# Patient Record
Sex: Male | Born: 1975 | Race: Black or African American | Hispanic: No | Marital: Single | Smoking: Current every day smoker
Health system: Southern US, Community
[De-identification: ages and names within clinical notes are randomized; demographics above are authoritative.]

## PROBLEM LIST (undated history)

## (undated) DIAGNOSIS — R4689 Other symptoms and signs involving appearance and behavior: Secondary | ICD-10-CM

## (undated) DIAGNOSIS — F1010 Alcohol abuse, uncomplicated: Secondary | ICD-10-CM

## (undated) DIAGNOSIS — I10 Essential (primary) hypertension: Secondary | ICD-10-CM

## (undated) DIAGNOSIS — F209 Schizophrenia, unspecified: Secondary | ICD-10-CM

## (undated) DIAGNOSIS — Z5900 Homelessness unspecified: Secondary | ICD-10-CM

## (undated) HISTORY — PX: MOUTH SURGERY: SHX715

## (undated) HISTORY — PX: KNEE SURGERY: SHX244

---

## 2006-03-14 ENCOUNTER — Emergency Department (HOSPITAL_COMMUNITY): Admission: EM | Admit: 2006-03-14 | Discharge: 2006-03-14 | Payer: Self-pay | Admitting: Emergency Medicine

## 2007-12-25 ENCOUNTER — Emergency Department (HOSPITAL_COMMUNITY): Admission: EM | Admit: 2007-12-25 | Discharge: 2007-12-26 | Payer: Self-pay | Admitting: Internal Medicine

## 2007-12-26 ENCOUNTER — Emergency Department (HOSPITAL_COMMUNITY): Admission: EM | Admit: 2007-12-26 | Discharge: 2007-12-26 | Payer: Self-pay | Admitting: Emergency Medicine

## 2007-12-29 ENCOUNTER — Emergency Department (HOSPITAL_COMMUNITY): Admission: EM | Admit: 2007-12-29 | Discharge: 2007-12-30 | Payer: Self-pay | Admitting: Podiatry

## 2008-04-08 ENCOUNTER — Encounter: Admission: RE | Admit: 2008-04-08 | Discharge: 2008-04-08 | Payer: Self-pay | Admitting: Family Medicine

## 2008-07-22 ENCOUNTER — Emergency Department (HOSPITAL_COMMUNITY): Admission: EM | Admit: 2008-07-22 | Discharge: 2008-07-22 | Payer: Self-pay | Admitting: Emergency Medicine

## 2008-08-25 ENCOUNTER — Emergency Department (HOSPITAL_COMMUNITY): Admission: EM | Admit: 2008-08-25 | Discharge: 2008-08-25 | Payer: Self-pay | Admitting: Emergency Medicine

## 2008-08-28 ENCOUNTER — Emergency Department (HOSPITAL_COMMUNITY): Admission: EM | Admit: 2008-08-28 | Discharge: 2008-08-28 | Payer: Self-pay | Admitting: Emergency Medicine

## 2008-12-30 ENCOUNTER — Other Ambulatory Visit: Payer: Self-pay

## 2008-12-31 ENCOUNTER — Ambulatory Visit: Payer: Self-pay | Admitting: Psychiatry

## 2008-12-31 ENCOUNTER — Inpatient Hospital Stay (HOSPITAL_COMMUNITY): Admission: AD | Admit: 2008-12-31 | Discharge: 2009-01-05 | Payer: Self-pay | Admitting: Psychiatry

## 2009-01-10 ENCOUNTER — Emergency Department (HOSPITAL_COMMUNITY): Admission: EM | Admit: 2009-01-10 | Discharge: 2009-01-10 | Payer: Self-pay | Admitting: Emergency Medicine

## 2009-02-01 ENCOUNTER — Emergency Department (HOSPITAL_COMMUNITY): Admission: EM | Admit: 2009-02-01 | Discharge: 2009-02-01 | Payer: Self-pay | Admitting: Family Medicine

## 2009-02-04 ENCOUNTER — Emergency Department (HOSPITAL_COMMUNITY): Admission: EM | Admit: 2009-02-04 | Discharge: 2009-02-06 | Payer: Self-pay | Admitting: Emergency Medicine

## 2009-02-19 ENCOUNTER — Emergency Department (HOSPITAL_COMMUNITY): Admission: EM | Admit: 2009-02-19 | Discharge: 2009-02-19 | Payer: Self-pay | Admitting: Family Medicine

## 2009-03-10 ENCOUNTER — Other Ambulatory Visit: Payer: Self-pay

## 2009-03-10 ENCOUNTER — Other Ambulatory Visit: Payer: Self-pay | Admitting: Emergency Medicine

## 2009-03-11 ENCOUNTER — Other Ambulatory Visit: Payer: Self-pay | Admitting: Emergency Medicine

## 2009-03-12 ENCOUNTER — Inpatient Hospital Stay (HOSPITAL_COMMUNITY): Admission: AD | Admit: 2009-03-12 | Discharge: 2009-03-18 | Payer: Self-pay | Admitting: Psychiatry

## 2009-03-12 ENCOUNTER — Ambulatory Visit: Payer: Self-pay | Admitting: Psychiatry

## 2009-04-03 ENCOUNTER — Emergency Department (HOSPITAL_COMMUNITY): Admission: EM | Admit: 2009-04-03 | Discharge: 2009-04-03 | Payer: Self-pay | Admitting: Otolaryngology

## 2009-04-04 ENCOUNTER — Emergency Department (HOSPITAL_COMMUNITY): Admission: EM | Admit: 2009-04-04 | Discharge: 2009-04-07 | Payer: Self-pay | Admitting: Emergency Medicine

## 2009-05-29 ENCOUNTER — Emergency Department (HOSPITAL_COMMUNITY): Admission: EM | Admit: 2009-05-29 | Discharge: 2009-05-31 | Payer: Self-pay | Admitting: Emergency Medicine

## 2009-05-30 ENCOUNTER — Ambulatory Visit: Payer: Self-pay | Admitting: Psychiatry

## 2009-07-06 ENCOUNTER — Emergency Department (HOSPITAL_COMMUNITY): Admission: EM | Admit: 2009-07-06 | Discharge: 2009-07-07 | Payer: Self-pay | Admitting: Emergency Medicine

## 2009-07-21 ENCOUNTER — Inpatient Hospital Stay (HOSPITAL_COMMUNITY): Admission: EM | Admit: 2009-07-21 | Discharge: 2009-07-24 | Payer: Self-pay | Admitting: Emergency Medicine

## 2009-07-24 ENCOUNTER — Ambulatory Visit: Payer: Self-pay | Admitting: Psychiatry

## 2009-07-24 ENCOUNTER — Inpatient Hospital Stay (HOSPITAL_COMMUNITY): Admission: RE | Admit: 2009-07-24 | Discharge: 2009-07-24 | Payer: Self-pay | Admitting: Psychiatry

## 2009-07-25 ENCOUNTER — Inpatient Hospital Stay (HOSPITAL_COMMUNITY): Admission: EM | Admit: 2009-07-25 | Discharge: 2009-07-28 | Payer: Self-pay | Admitting: Emergency Medicine

## 2009-07-25 ENCOUNTER — Ambulatory Visit: Payer: Self-pay | Admitting: Vascular Surgery

## 2009-07-25 ENCOUNTER — Encounter (INDEPENDENT_AMBULATORY_CARE_PROVIDER_SITE_OTHER): Payer: Self-pay | Admitting: Emergency Medicine

## 2009-07-26 ENCOUNTER — Ambulatory Visit: Payer: Self-pay | Admitting: Psychiatry

## 2009-07-28 ENCOUNTER — Inpatient Hospital Stay (HOSPITAL_COMMUNITY): Admission: EM | Admit: 2009-07-28 | Discharge: 2009-08-02 | Payer: Self-pay | Admitting: Psychiatry

## 2009-07-30 ENCOUNTER — Other Ambulatory Visit: Payer: Self-pay | Admitting: Emergency Medicine

## 2009-09-07 ENCOUNTER — Emergency Department (HOSPITAL_COMMUNITY): Admission: EM | Admit: 2009-09-07 | Discharge: 2009-09-07 | Payer: Self-pay | Admitting: Emergency Medicine

## 2009-10-03 ENCOUNTER — Emergency Department (HOSPITAL_COMMUNITY): Admission: EM | Admit: 2009-10-03 | Discharge: 2009-10-06 | Payer: Self-pay | Admitting: Emergency Medicine

## 2009-10-09 ENCOUNTER — Emergency Department (HOSPITAL_COMMUNITY): Admission: EM | Admit: 2009-10-09 | Discharge: 2009-10-10 | Payer: Self-pay | Admitting: Emergency Medicine

## 2009-12-03 ENCOUNTER — Emergency Department (HOSPITAL_COMMUNITY): Admission: EM | Admit: 2009-12-03 | Discharge: 2009-12-05 | Payer: Self-pay | Admitting: Emergency Medicine

## 2010-04-22 ENCOUNTER — Emergency Department (HOSPITAL_COMMUNITY)
Admission: EM | Admit: 2010-04-22 | Discharge: 2010-04-22 | Disposition: A | Discharge status: Home / Self Care | Payer: Medicaid Other | Attending: Emergency Medicine | Admitting: Emergency Medicine

## 2010-04-22 ENCOUNTER — Emergency Department (HOSPITAL_COMMUNITY): Payer: Medicaid Other

## 2010-04-22 DIAGNOSIS — W230XXA Caught, crushed, jammed, or pinched between moving objects, initial encounter: Secondary | ICD-10-CM | POA: Insufficient documentation

## 2010-04-22 DIAGNOSIS — G40909 Epilepsy, unspecified, not intractable, without status epilepticus: Secondary | ICD-10-CM | POA: Insufficient documentation

## 2010-04-22 DIAGNOSIS — F319 Bipolar disorder, unspecified: Secondary | ICD-10-CM | POA: Insufficient documentation

## 2010-04-22 DIAGNOSIS — S6000XA Contusion of unspecified finger without damage to nail, initial encounter: Secondary | ICD-10-CM | POA: Insufficient documentation

## 2010-04-22 DIAGNOSIS — J45909 Unspecified asthma, uncomplicated: Secondary | ICD-10-CM | POA: Insufficient documentation

## 2010-04-22 DIAGNOSIS — F209 Schizophrenia, unspecified: Secondary | ICD-10-CM | POA: Insufficient documentation

## 2010-05-05 LAB — CBC
HCT: 44.6 % (ref 39.0–52.0)
Hemoglobin: 15.4 g/dL (ref 13.0–17.0)
MCH: 33 pg (ref 26.0–34.0)
MCHC: 34.6 g/dL (ref 30.0–36.0)
MCV: 95.6 fL (ref 78.0–100.0)
Platelets: 135 10*3/uL — ABNORMAL LOW (ref 150–400)
RBC: 4.66 MIL/uL (ref 4.22–5.81)
RDW: 12.2 % (ref 11.5–15.5)
WBC: 7.2 10*3/uL (ref 4.0–10.5)

## 2010-05-05 LAB — COMPREHENSIVE METABOLIC PANEL
ALT: 19 U/L (ref 0–53)
AST: 25 U/L (ref 0–37)
Albumin: 3.9 g/dL (ref 3.5–5.2)
Alkaline Phosphatase: 60 U/L (ref 39–117)
BUN: 9 mg/dL (ref 6–23)
CO2: 23 mEq/L (ref 19–32)
Calcium: 9.5 mg/dL (ref 8.4–10.5)
Chloride: 106 mEq/L (ref 96–112)
Creatinine, Ser: 1.05 mg/dL (ref 0.4–1.5)
GFR calc Af Amer: >60 mL/min (ref >60)
GFR calc non Af Amer: >60 mL/min (ref >60)
Glucose, Bld: 110 mg/dL — ABNORMAL HIGH (ref 70–99)
Potassium: 3.5 mEq/L (ref 3.5–5.1)
Sodium: 141 mEq/L (ref 135–145)
Total Bilirubin: 0.9 mg/dL (ref 0.3–1.2)
Total Protein: 7.1 g/dL (ref 6.0–8.3)

## 2010-05-05 LAB — URINALYSIS, ROUTINE W REFLEX MICROSCOPIC
Bilirubin Urine: NEGATIVE
Glucose, UA: NEGATIVE mg/dL
Hgb urine dipstick: NEGATIVE
Ketones, ur: NEGATIVE mg/dL
Nitrite: NEGATIVE
Protein, ur: NEGATIVE mg/dL
Specific Gravity, Urine: 1.023 (ref 1.005–1.030)
Urobilinogen, UA: 1 mg/dL (ref 0.0–1.0)
pH: 5 (ref 5.0–8.0)

## 2010-05-05 LAB — RAPID URINE DRUG SCREEN, HOSP PERFORMED
Amphetamines: NOT DETECTED
Barbiturates: NOT DETECTED
Benzodiazepines: NOT DETECTED
Cocaine: NOT DETECTED
Opiates: NOT DETECTED
Tetrahydrocannabinol: POSITIVE — AB

## 2010-05-05 LAB — DIFFERENTIAL
Basophils Absolute: 0 10*3/uL (ref 0.0–0.1)
Basophils Relative: 0 % (ref 0–1)
Eosinophils Absolute: 0 10*3/uL (ref 0.0–0.7)
Eosinophils Relative: 0 % (ref 0–5)
Lymphocytes Relative: 17 % (ref 12–46)
Lymphs Abs: 1.2 10*3/uL (ref 0.7–4.0)
Monocytes Absolute: 0.6 10*3/uL (ref 0.1–1.0)
Monocytes Relative: 9 % (ref 3–12)
Neutro Abs: 5.3 10*3/uL (ref 1.7–7.7)
Neutrophils Relative %: 74 % (ref 43–77)

## 2010-05-05 LAB — ETHANOL: Alcohol, Ethyl (B): 81 mg/dL — ABNORMAL HIGH (ref 0–10)

## 2010-05-05 LAB — VALPROIC ACID LEVEL: Valproic Acid Lvl: <10 ug/mL — ABNORMAL LOW (ref 50.0–100.0)

## 2010-05-05 LAB — LITHIUM LEVEL: Lithium Lvl: <0.25 mEq/L — ABNORMAL LOW (ref 0.80–1.40)

## 2010-05-06 LAB — RAPID URINE DRUG SCREEN, HOSP PERFORMED
Amphetamines: NOT DETECTED
Amphetamines: NOT DETECTED
Barbiturates: NOT DETECTED
Barbiturates: NOT DETECTED
Benzodiazepines: NOT DETECTED
Benzodiazepines: NOT DETECTED
Cocaine: NOT DETECTED
Cocaine: NOT DETECTED
Opiates: NOT DETECTED
Opiates: NOT DETECTED
Tetrahydrocannabinol: POSITIVE — AB
Tetrahydrocannabinol: POSITIVE — AB

## 2010-05-06 LAB — TRICYCLICS SCREEN, URINE: TCA Scrn: NOT DETECTED

## 2010-05-06 LAB — BASIC METABOLIC PANEL
BUN: 14 mg/dL (ref 6–23)
BUN: 16 mg/dL (ref 6–23)
CO2: 25 mEq/L (ref 19–32)
CO2: 30 mEq/L (ref 19–32)
Calcium: 9.5 mg/dL (ref 8.4–10.5)
Calcium: 9.5 mg/dL (ref 8.4–10.5)
Chloride: 108 mEq/L (ref 96–112)
Chloride: 107 mEq/L (ref 96–112)
Creatinine, Ser: 1.03 mg/dL (ref 0.4–1.5)
Creatinine, Ser: 1.4 mg/dL (ref 0.4–1.5)
GFR calc Af Amer: >60 mL/min (ref >60)
GFR calc Af Amer: >60 mL/min (ref >60)
GFR calc non Af Amer: >60 mL/min (ref >60)
GFR calc non Af Amer: 58 mL/min — ABNORMAL LOW (ref >60)
Glucose, Bld: 94 mg/dL (ref 70–99)
Glucose, Bld: 82 mg/dL (ref 70–99)
Potassium: 3.9 mEq/L (ref 3.5–5.1)
Potassium: 4.4 mEq/L (ref 3.5–5.1)
Sodium: 140 mEq/L (ref 135–145)
Sodium: 142 mEq/L (ref 135–145)

## 2010-05-06 LAB — DIFFERENTIAL
Basophils Absolute: 0 10*3/uL (ref 0.0–0.1)
Basophils Absolute: 0 10*3/uL (ref 0.0–0.1)
Basophils Relative: 0 % (ref 0–1)
Basophils Relative: 0 % (ref 0–1)
Eosinophils Absolute: 0 10*3/uL (ref 0.0–0.7)
Eosinophils Absolute: 0 10*3/uL (ref 0.0–0.7)
Eosinophils Relative: 0 % (ref 0–5)
Eosinophils Relative: 0 % (ref 0–5)
Lymphocytes Relative: 30 % (ref 12–46)
Lymphocytes Relative: 31 % (ref 12–46)
Lymphs Abs: 1.8 10*3/uL (ref 0.7–4.0)
Lymphs Abs: 2.6 10*3/uL (ref 0.7–4.0)
Monocytes Absolute: 0.5 10*3/uL (ref 0.1–1.0)
Monocytes Absolute: 0.7 10*3/uL (ref 0.1–1.0)
Monocytes Relative: 9 % (ref 3–12)
Monocytes Relative: 9 % (ref 3–12)
Neutro Abs: 3.6 10*3/uL (ref 1.7–7.7)
Neutro Abs: 5 10*3/uL (ref 1.7–7.7)
Neutrophils Relative %: 61 % (ref 43–77)
Neutrophils Relative %: 59 % (ref 43–77)

## 2010-05-06 LAB — VALPROIC ACID LEVEL
Valproic Acid Lvl: 107.8 ug/mL — ABNORMAL HIGH (ref 50.0–100.0)
Valproic Acid Lvl: 63.1 ug/mL (ref 50.0–100.0)

## 2010-05-06 LAB — ETHANOL
Alcohol, Ethyl (B): <5 mg/dL (ref 0–10)
Alcohol, Ethyl (B): <5 mg/dL (ref 0–10)

## 2010-05-06 LAB — CBC
HCT: 47.6 % (ref 39.0–52.0)
HCT: 46.5 % (ref 39.0–52.0)
Hemoglobin: 16.1 g/dL (ref 13.0–17.0)
Hemoglobin: 15.6 g/dL (ref 13.0–17.0)
MCH: 33.4 pg (ref 26.0–34.0)
MCH: 33.3 pg (ref 26.0–34.0)
MCHC: 33.9 g/dL (ref 30.0–36.0)
MCHC: 33.5 g/dL (ref 30.0–36.0)
MCV: 98.4 fL (ref 78.0–100.0)
MCV: 99.5 fL (ref 78.0–100.0)
Platelets: 107 10*3/uL — ABNORMAL LOW (ref 150–400)
Platelets: 112 10*3/uL — ABNORMAL LOW (ref 150–400)
RBC: 4.83 MIL/uL (ref 4.22–5.81)
RBC: 4.68 MIL/uL (ref 4.22–5.81)
RDW: 13.3 % (ref 11.5–15.5)
RDW: 13.4 % (ref 11.5–15.5)
WBC: 6 10*3/uL (ref 4.0–10.5)
WBC: 8.4 10*3/uL (ref 4.0–10.5)

## 2010-05-06 LAB — LITHIUM LEVEL
Lithium Lvl: <0.25 mEq/L — ABNORMAL LOW (ref 0.80–1.40)
Lithium Lvl: 1.53 mEq/L — ABNORMAL HIGH (ref 0.80–1.40)

## 2010-05-07 LAB — BASIC METABOLIC PANEL
BUN: 9 mg/dL (ref 6–23)
CO2: 30 mEq/L (ref 19–32)
Calcium: 9.5 mg/dL (ref 8.4–10.5)
Chloride: 107 mEq/L (ref 96–112)
Creatinine, Ser: 1.11 mg/dL (ref 0.4–1.5)
GFR calc Af Amer: >60 mL/min (ref >60)
GFR calc non Af Amer: >60 mL/min (ref >60)
Glucose, Bld: 88 mg/dL (ref 70–99)
Potassium: 4.3 mEq/L (ref 3.5–5.1)
Sodium: 140 mEq/L (ref 135–145)

## 2010-05-07 LAB — GLUCOSE, CAPILLARY: Glucose-Capillary: 79 mg/dL (ref 70–99)

## 2010-05-07 LAB — VALPROIC ACID LEVEL: Valproic Acid Lvl: 74.8 ug/mL (ref 50.0–100.0)

## 2010-05-07 LAB — LITHIUM LEVEL: Lithium Lvl: 0.61 mEq/L — ABNORMAL LOW (ref 0.80–1.40)

## 2010-05-08 LAB — BASIC METABOLIC PANEL
BUN: 4 mg/dL — ABNORMAL LOW (ref 6–23)
CO2: 29 mEq/L (ref 19–32)
Calcium: 9.1 mg/dL (ref 8.4–10.5)
Chloride: 108 mEq/L (ref 96–112)
Creatinine, Ser: 1.05 mg/dL (ref 0.4–1.5)
GFR calc Af Amer: >60 mL/min (ref >60)
GFR calc non Af Amer: >60 mL/min (ref >60)
Glucose, Bld: 83 mg/dL (ref 70–99)
Potassium: 4 mEq/L (ref 3.5–5.1)
Sodium: 141 mEq/L (ref 135–145)

## 2010-05-08 LAB — CBC
HCT: 46.4 % (ref 39.0–52.0)
Hemoglobin: 16 g/dL (ref 13.0–17.0)
MCHC: 34.5 g/dL (ref 30.0–36.0)
MCV: 93.8 fL (ref 78.0–100.0)
Platelets: 138 10*3/uL — ABNORMAL LOW (ref 150–400)
RBC: 4.94 MIL/uL (ref 4.22–5.81)
RDW: 11.6 % (ref 11.5–15.5)
WBC: 4.8 10*3/uL (ref 4.0–10.5)

## 2010-05-08 LAB — DIFFERENTIAL
Basophils Absolute: 0 10*3/uL (ref 0.0–0.1)
Basophils Relative: 1 % (ref 0–1)
Eosinophils Absolute: 0 10*3/uL (ref 0.0–0.7)
Eosinophils Relative: 0 % (ref 0–5)
Lymphocytes Relative: 36 % (ref 12–46)
Lymphs Abs: 1.7 10*3/uL (ref 0.7–4.0)
Monocytes Absolute: 0.4 10*3/uL (ref 0.1–1.0)
Monocytes Relative: 9 % (ref 3–12)
Neutro Abs: 2.7 10*3/uL (ref 1.7–7.7)
Neutrophils Relative %: 55 % (ref 43–77)

## 2010-05-08 LAB — RAPID URINE DRUG SCREEN, HOSP PERFORMED
Amphetamines: NOT DETECTED
Barbiturates: NOT DETECTED
Benzodiazepines: NOT DETECTED
Cocaine: NOT DETECTED
Opiates: NOT DETECTED
Tetrahydrocannabinol: NOT DETECTED

## 2010-05-08 LAB — URINALYSIS, ROUTINE W REFLEX MICROSCOPIC
Bilirubin Urine: NEGATIVE
Glucose, UA: NEGATIVE mg/dL
Hgb urine dipstick: NEGATIVE
Ketones, ur: NEGATIVE mg/dL
Nitrite: NEGATIVE
Protein, ur: NEGATIVE mg/dL
Specific Gravity, Urine: 1.009 (ref 1.005–1.030)
Urobilinogen, UA: 1 mg/dL (ref 0.0–1.0)
pH: 7 (ref 5.0–8.0)

## 2010-05-08 LAB — ETHANOL: Alcohol, Ethyl (B): <5 mg/dL (ref 0–10)

## 2010-05-08 LAB — TRICYCLICS SCREEN, URINE: TCA Scrn: NOT DETECTED

## 2010-05-08 LAB — VALPROIC ACID LEVEL: Valproic Acid Lvl: 70.3 ug/mL (ref 50.0–100.0)

## 2010-05-09 LAB — MAGNESIUM: Magnesium: 1.7 mg/dL (ref 1.5–2.5)

## 2010-05-09 LAB — VALPROIC ACID LEVEL
Valproic Acid Lvl: 13.4 ug/mL — ABNORMAL LOW (ref 50.0–100.0)
Valproic Acid Lvl: 135.2 ug/mL — ABNORMAL HIGH (ref 50.0–100.0)
Valproic Acid Lvl: 48.5 ug/mL — ABNORMAL LOW (ref 50.0–100.0)
Valproic Acid Lvl: 70.6 ug/mL (ref 50.0–100.0)
Valproic Acid Lvl: 77.6 ug/mL (ref 50.0–100.0)

## 2010-05-09 LAB — CBC
HCT: 42.9 % (ref 39.0–52.0)
HCT: 43.3 % (ref 39.0–52.0)
HCT: 44.1 % (ref 39.0–52.0)
HCT: 44.4 % (ref 39.0–52.0)
HCT: 45.9 % (ref 39.0–52.0)
HCT: 46.8 % (ref 39.0–52.0)
Hemoglobin: 14.3 g/dL (ref 13.0–17.0)
Hemoglobin: 14.6 g/dL (ref 13.0–17.0)
Hemoglobin: 14.7 g/dL (ref 13.0–17.0)
Hemoglobin: 14.8 g/dL (ref 13.0–17.0)
Hemoglobin: 14.8 g/dL (ref 13.0–17.0)
Hemoglobin: 15.6 g/dL (ref 13.0–17.0)
MCHC: 32.3 g/dL (ref 30.0–36.0)
MCHC: 33 g/dL (ref 30.0–36.0)
MCHC: 33.1 g/dL (ref 30.0–36.0)
MCHC: 33.3 g/dL (ref 30.0–36.0)
MCHC: 33.3 g/dL (ref 30.0–36.0)
MCHC: 34.1 g/dL (ref 30.0–36.0)
MCV: 94.9 fL (ref 78.0–100.0)
MCV: 95.3 fL (ref 78.0–100.0)
MCV: 95.5 fL (ref 78.0–100.0)
MCV: 95.8 fL (ref 78.0–100.0)
MCV: 96 fL (ref 78.0–100.0)
MCV: 96.9 fL (ref 78.0–100.0)
Platelets: 100 10*3/uL — ABNORMAL LOW (ref 150–400)
Platelets: 102 10*3/uL — ABNORMAL LOW (ref 150–400)
Platelets: 104 10*3/uL — ABNORMAL LOW (ref 150–400)
Platelets: 110 10*3/uL — ABNORMAL LOW (ref 150–400)
Platelets: 83 10*3/uL — ABNORMAL LOW (ref 150–400)
RBC: 4.52 MIL/uL (ref 4.22–5.81)
RBC: 4.54 MIL/uL (ref 4.22–5.81)
RBC: 4.61 MIL/uL (ref 4.22–5.81)
RBC: 4.62 MIL/uL (ref 4.22–5.81)
RBC: 4.74 MIL/uL (ref 4.22–5.81)
RBC: 4.9 MIL/uL (ref 4.22–5.81)
RDW: 12.7 % (ref 11.5–15.5)
RDW: 12.7 % (ref 11.5–15.5)
RDW: 12.7 % (ref 11.5–15.5)
RDW: 12.9 % (ref 11.5–15.5)
RDW: 12.9 % (ref 11.5–15.5)
RDW: 12.9 % (ref 11.5–15.5)
WBC: 5.7 10*3/uL (ref 4.0–10.5)
WBC: 6 10*3/uL (ref 4.0–10.5)
WBC: 6.4 10*3/uL (ref 4.0–10.5)
WBC: 6.6 10*3/uL (ref 4.0–10.5)
WBC: 7 10*3/uL (ref 4.0–10.5)
WBC: 7.4 10*3/uL (ref 4.0–10.5)

## 2010-05-09 LAB — BASIC METABOLIC PANEL
BUN: 12 mg/dL (ref 6–23)
BUN: 7 mg/dL (ref 6–23)
CO2: 28 mEq/L (ref 19–32)
CO2: 29 mEq/L (ref 19–32)
Calcium: 9.6 mg/dL (ref 8.4–10.5)
Calcium: 9.7 mg/dL (ref 8.4–10.5)
Chloride: 107 mEq/L (ref 96–112)
Chloride: 112 mEq/L (ref 96–112)
Creatinine, Ser: 1.03 mg/dL (ref 0.4–1.5)
Creatinine, Ser: 1.1 mg/dL (ref 0.4–1.5)
GFR calc Af Amer: >60 mL/min (ref >60)
GFR calc Af Amer: >60 mL/min (ref >60)
GFR calc non Af Amer: >60 mL/min (ref >60)
GFR calc non Af Amer: >60 mL/min (ref >60)
Glucose, Bld: 81 mg/dL (ref 70–99)
Glucose, Bld: 83 mg/dL (ref 70–99)
Potassium: 3.9 mEq/L (ref 3.5–5.1)
Potassium: 4.3 mEq/L (ref 3.5–5.1)
Sodium: 141 mEq/L (ref 135–145)
Sodium: 145 mEq/L (ref 135–145)

## 2010-05-09 LAB — RAPID URINE DRUG SCREEN, HOSP PERFORMED
Amphetamines: NOT DETECTED
Barbiturates: NOT DETECTED
Benzodiazepines: POSITIVE — AB
Cocaine: NOT DETECTED
Opiates: NOT DETECTED
Tetrahydrocannabinol: POSITIVE — AB

## 2010-05-09 LAB — COMPREHENSIVE METABOLIC PANEL
ALT: 10 U/L (ref 0–53)
ALT: 13 U/L (ref 0–53)
ALT: 15 U/L (ref 0–53)
ALT: 15 U/L (ref 0–53)
ALT: 16 U/L (ref 0–53)
AST: 13 U/L (ref 0–37)
AST: 16 U/L (ref 0–37)
AST: 16 U/L (ref 0–37)
AST: 18 U/L (ref 0–37)
AST: 30 U/L (ref 0–37)
Albumin: 3.2 g/dL — ABNORMAL LOW (ref 3.5–5.2)
Albumin: 3.2 g/dL — ABNORMAL LOW (ref 3.5–5.2)
Albumin: 3.5 g/dL (ref 3.5–5.2)
Albumin: 3.5 g/dL (ref 3.5–5.2)
Albumin: 3.7 g/dL (ref 3.5–5.2)
Alkaline Phosphatase: 45 U/L (ref 39–117)
Alkaline Phosphatase: 48 U/L (ref 39–117)
Alkaline Phosphatase: 48 U/L (ref 39–117)
Alkaline Phosphatase: 50 U/L (ref 39–117)
Alkaline Phosphatase: 51 U/L (ref 39–117)
BUN: 15 mg/dL (ref 6–23)
BUN: 3 mg/dL — ABNORMAL LOW (ref 6–23)
BUN: 6 mg/dL (ref 6–23)
BUN: 9 mg/dL (ref 6–23)
BUN: 9 mg/dL (ref 6–23)
CO2: 23 mEq/L (ref 19–32)
CO2: 25 mEq/L (ref 19–32)
CO2: 26 mEq/L (ref 19–32)
CO2: 27 mEq/L (ref 19–32)
CO2: 28 mEq/L (ref 19–32)
Calcium: 9 mg/dL (ref 8.4–10.5)
Calcium: 9.3 mg/dL (ref 8.4–10.5)
Calcium: 9.5 mg/dL (ref 8.4–10.5)
Calcium: 9.5 mg/dL (ref 8.4–10.5)
Calcium: 9.8 mg/dL (ref 8.4–10.5)
Chloride: 108 mEq/L (ref 96–112)
Chloride: 110 mEq/L (ref 96–112)
Chloride: 112 mEq/L (ref 96–112)
Chloride: 112 mEq/L (ref 96–112)
Chloride: 114 mEq/L — ABNORMAL HIGH (ref 96–112)
Creatinine, Ser: 0.97 mg/dL (ref 0.4–1.5)
Creatinine, Ser: 1.02 mg/dL (ref 0.4–1.5)
Creatinine, Ser: 1.04 mg/dL (ref 0.4–1.5)
Creatinine, Ser: 1.06 mg/dL (ref 0.4–1.5)
Creatinine, Ser: 1.11 mg/dL (ref 0.4–1.5)
GFR calc Af Amer: >60 mL/min (ref >60)
GFR calc Af Amer: >60 mL/min (ref >60)
GFR calc Af Amer: >60 mL/min (ref >60)
GFR calc Af Amer: >60 mL/min (ref >60)
GFR calc Af Amer: >60 mL/min (ref >60)
GFR calc non Af Amer: >60 mL/min (ref >60)
GFR calc non Af Amer: >60 mL/min (ref >60)
GFR calc non Af Amer: >60 mL/min (ref >60)
GFR calc non Af Amer: >60 mL/min (ref >60)
GFR calc non Af Amer: >60 mL/min (ref >60)
Glucose, Bld: 80 mg/dL (ref 70–99)
Glucose, Bld: 81 mg/dL (ref 70–99)
Glucose, Bld: 82 mg/dL (ref 70–99)
Glucose, Bld: 89 mg/dL (ref 70–99)
Glucose, Bld: 97 mg/dL (ref 70–99)
Potassium: 3.8 mEq/L (ref 3.5–5.1)
Potassium: 3.8 mEq/L (ref 3.5–5.1)
Potassium: 4.1 mEq/L (ref 3.5–5.1)
Potassium: 4.2 mEq/L (ref 3.5–5.1)
Potassium: 5.3 mEq/L — ABNORMAL HIGH (ref 3.5–5.1)
Sodium: 141 mEq/L (ref 135–145)
Sodium: 142 mEq/L (ref 135–145)
Sodium: 142 mEq/L (ref 135–145)
Sodium: 142 mEq/L (ref 135–145)
Sodium: 144 mEq/L (ref 135–145)
Total Bilirubin: 0.9 mg/dL (ref 0.3–1.2)
Total Bilirubin: 1 mg/dL (ref 0.3–1.2)
Total Bilirubin: 1 mg/dL (ref 0.3–1.2)
Total Bilirubin: 1.3 mg/dL — ABNORMAL HIGH (ref 0.3–1.2)
Total Bilirubin: 1.3 mg/dL — ABNORMAL HIGH (ref 0.3–1.2)
Total Protein: 5.7 g/dL — ABNORMAL LOW (ref 6.0–8.3)
Total Protein: 5.8 g/dL — ABNORMAL LOW (ref 6.0–8.3)
Total Protein: 5.8 g/dL — ABNORMAL LOW (ref 6.0–8.3)
Total Protein: 6.2 g/dL (ref 6.0–8.3)
Total Protein: 6.6 g/dL (ref 6.0–8.3)

## 2010-05-09 LAB — TSH
TSH: 0.614 u[IU]/mL (ref 0.350–4.500)
TSH: 0.866 u[IU]/mL (ref 0.350–4.500)

## 2010-05-09 LAB — URINALYSIS, ROUTINE W REFLEX MICROSCOPIC
Bilirubin Urine: NEGATIVE
Glucose, UA: NEGATIVE mg/dL
Hgb urine dipstick: NEGATIVE
Ketones, ur: NEGATIVE mg/dL
Nitrite: NEGATIVE
Protein, ur: NEGATIVE mg/dL
Specific Gravity, Urine: 1.007 (ref 1.005–1.030)
Urobilinogen, UA: 1 mg/dL (ref 0.0–1.0)
pH: 7.5 (ref 5.0–8.0)

## 2010-05-09 LAB — GLUCOSE, CAPILLARY
Glucose-Capillary: 80 mg/dL (ref 70–99)
Glucose-Capillary: 80 mg/dL (ref 70–99)
Glucose-Capillary: 85 mg/dL (ref 70–99)

## 2010-05-09 LAB — DIFFERENTIAL
Basophils Absolute: 0 10*3/uL (ref 0.0–0.1)
Basophils Absolute: 0 10*3/uL (ref 0.0–0.1)
Basophils Relative: 0 % (ref 0–1)
Basophils Relative: 0 % (ref 0–1)
Eosinophils Absolute: 0 10*3/uL (ref 0.0–0.7)
Eosinophils Absolute: 0 10*3/uL (ref 0.0–0.7)
Eosinophils Relative: 0 % (ref 0–5)
Eosinophils Relative: 0 % (ref 0–5)
Lymphocytes Relative: 21 % (ref 12–46)
Lymphocytes Relative: 36 % (ref 12–46)
Lymphs Abs: 1.6 10*3/uL (ref 0.7–4.0)
Lymphs Abs: 2.1 10*3/uL (ref 0.7–4.0)
Monocytes Absolute: 0.4 10*3/uL (ref 0.1–1.0)
Monocytes Absolute: 0.8 10*3/uL (ref 0.1–1.0)
Monocytes Relative: 11 % (ref 3–12)
Monocytes Relative: 7 % (ref 3–12)
Neutro Abs: 3.4 10*3/uL (ref 1.7–7.7)
Neutro Abs: 5.1 10*3/uL (ref 1.7–7.7)
Neutrophils Relative %: 58 % (ref 43–77)
Neutrophils Relative %: 68 % (ref 43–77)

## 2010-05-09 LAB — POCT I-STAT, CHEM 8
BUN: 9 mg/dL (ref 6–23)
Calcium, Ion: 1.19 mmol/L (ref 1.12–1.32)
Chloride: 108 mEq/L (ref 96–112)
Creatinine, Ser: 0.9 mg/dL (ref 0.4–1.5)
Glucose, Bld: 92 mg/dL (ref 70–99)
HCT: 47 % (ref 39.0–52.0)
Hemoglobin: 16 g/dL (ref 13.0–17.0)
Potassium: 3.8 mEq/L (ref 3.5–5.1)
Sodium: 143 mEq/L (ref 135–145)
TCO2: 25 mmol/L (ref 0–100)

## 2010-05-09 LAB — CK: Total CK: 154 U/L (ref 7–232)

## 2010-05-09 LAB — CULTURE, BLOOD (ROUTINE X 2)
Culture: NO GROWTH
Culture: NO GROWTH

## 2010-05-09 LAB — CARDIAC PANEL(CRET KIN+CKTOT+MB+TROPI)
CK, MB: 3.1 ng/mL (ref 0.3–4.0)
CK, MB: 3.3 ng/mL (ref 0.3–4.0)
Relative Index: INVALID (ref 0.0–2.5)
Relative Index: INVALID (ref 0.0–2.5)
Total CK: 49 U/L (ref 7–232)
Total CK: 75 U/L (ref 7–232)
Troponin I: 0.01 ng/mL (ref 0.00–0.06)
Troponin I: 0.02 ng/mL (ref 0.00–0.06)

## 2010-05-09 LAB — PROTIME-INR
INR: 1.23 (ref 0.00–1.49)
Prothrombin Time: 15.4 seconds — ABNORMAL HIGH (ref 11.6–15.2)

## 2010-05-09 LAB — LIPID PANEL
Cholesterol: 146 mg/dL (ref 0–200)
HDL: 30 mg/dL — ABNORMAL LOW (ref >39)
LDL Cholesterol: 79 mg/dL (ref 0–99)
Total CHOL/HDL Ratio: 4.9 RATIO
Triglycerides: 184 mg/dL — ABNORMAL HIGH (ref <150)
VLDL: 37 mg/dL (ref 0–40)

## 2010-05-09 LAB — LITHIUM LEVEL
Lithium Lvl: 0.67 mEq/L — ABNORMAL LOW (ref 0.80–1.40)
Lithium Lvl: 0.76 mEq/L — ABNORMAL LOW (ref 0.80–1.40)
Lithium Lvl: <0.25 mEq/L — ABNORMAL LOW (ref 0.80–1.40)

## 2010-05-09 LAB — HEPATIC FUNCTION PANEL
ALT: 17 U/L (ref 0–53)
AST: 27 U/L (ref 0–37)
Albumin: 3.1 g/dL — ABNORMAL LOW (ref 3.5–5.2)
Alkaline Phosphatase: 69 U/L (ref 39–117)
Bilirubin, Direct: 0.1 mg/dL (ref 0.0–0.3)
Indirect Bilirubin: 0.2 mg/dL — ABNORMAL LOW (ref 0.3–0.9)
Total Bilirubin: 0.3 mg/dL (ref 0.3–1.2)
Total Protein: 5.8 g/dL — ABNORMAL LOW (ref 6.0–8.3)

## 2010-05-09 LAB — HEMOGLOBIN A1C
Hgb A1c MFr Bld: 4.9 % (ref <5.7)
Mean Plasma Glucose: 94 mg/dL (ref <117)

## 2010-05-09 LAB — T4, FREE: Free T4: 1.08 ng/dL (ref 0.80–1.80)

## 2010-05-09 LAB — AMMONIA
Ammonia: 60 umol/L — ABNORMAL HIGH (ref 11–35)
Ammonia: 72 umol/L — ABNORMAL HIGH (ref 11–35)

## 2010-05-09 LAB — RPR: RPR Ser Ql: NONREACTIVE

## 2010-05-09 LAB — VANCOMYCIN, TROUGH: Vancomycin Tr: 6.7 ug/mL — ABNORMAL LOW (ref 10.0–20.0)

## 2010-05-09 LAB — ETHANOL: Alcohol, Ethyl (B): <5 mg/dL (ref 0–10)

## 2010-05-11 LAB — CBC
HCT: 46.6 % (ref 39.0–52.0)
HCT: 44.6 % (ref 39.0–52.0)
HCT: 45.3 % (ref 39.0–52.0)
HCT: 46.6 % (ref 39.0–52.0)
Hemoglobin: 15.5 g/dL (ref 13.0–17.0)
Hemoglobin: 15.2 g/dL (ref 13.0–17.0)
Hemoglobin: 15.3 g/dL (ref 13.0–17.0)
Hemoglobin: 15.9 g/dL (ref 13.0–17.0)
MCHC: 33.3 g/dL (ref 30.0–36.0)
MCHC: 33.8 g/dL (ref 30.0–36.0)
MCHC: 34.2 g/dL (ref 30.0–36.0)
MCHC: 34.2 g/dL (ref 30.0–36.0)
MCV: 96.6 fL (ref 78.0–100.0)
MCV: 93.4 fL (ref 78.0–100.0)
MCV: 93.6 fL (ref 78.0–100.0)
MCV: 94.5 fL (ref 78.0–100.0)
Platelets: 154 10*3/uL (ref 150–400)
Platelets: 138 10*3/uL — ABNORMAL LOW (ref 150–400)
Platelets: 148 10*3/uL — ABNORMAL LOW (ref 150–400)
Platelets: 170 10*3/uL (ref 150–400)
RBC: 4.82 MIL/uL (ref 4.22–5.81)
RBC: 4.77 MIL/uL (ref 4.22–5.81)
RBC: 4.79 MIL/uL (ref 4.22–5.81)
RBC: 4.99 MIL/uL (ref 4.22–5.81)
RDW: 13.2 % (ref 11.5–15.5)
RDW: 12.7 % (ref 11.5–15.5)
RDW: 12.8 % (ref 11.5–15.5)
RDW: 12.8 % (ref 11.5–15.5)
WBC: 11.2 10*3/uL — ABNORMAL HIGH (ref 4.0–10.5)
WBC: 12.9 10*3/uL — ABNORMAL HIGH (ref 4.0–10.5)
WBC: 6.2 10*3/uL (ref 4.0–10.5)
WBC: 6.7 10*3/uL (ref 4.0–10.5)

## 2010-05-11 LAB — COMPREHENSIVE METABOLIC PANEL
ALT: 13 U/L (ref 0–53)
ALT: 23 U/L (ref 0–53)
AST: 18 U/L (ref 0–37)
AST: 55 U/L — ABNORMAL HIGH (ref 0–37)
Albumin: 3.3 g/dL — ABNORMAL LOW (ref 3.5–5.2)
Albumin: 3.9 g/dL (ref 3.5–5.2)
Alkaline Phosphatase: 61 U/L (ref 39–117)
Alkaline Phosphatase: 67 U/L (ref 39–117)
BUN: 8 mg/dL (ref 6–23)
BUN: 10 mg/dL (ref 6–23)
CO2: 25 mEq/L (ref 19–32)
CO2: 25 mEq/L (ref 19–32)
Calcium: 8.2 mg/dL — ABNORMAL LOW (ref 8.4–10.5)
Calcium: 9.2 mg/dL (ref 8.4–10.5)
Chloride: 115 mEq/L — ABNORMAL HIGH (ref 96–112)
Chloride: 109 mEq/L (ref 96–112)
Creatinine, Ser: 0.97 mg/dL (ref 0.4–1.5)
Creatinine, Ser: 1.46 mg/dL (ref 0.4–1.5)
GFR calc Af Amer: >60 mL/min (ref >60)
GFR calc Af Amer: >60 mL/min (ref >60)
GFR calc non Af Amer: >60 mL/min (ref >60)
GFR calc non Af Amer: 56 mL/min — ABNORMAL LOW (ref >60)
Glucose, Bld: 93 mg/dL (ref 70–99)
Glucose, Bld: 77 mg/dL (ref 70–99)
Potassium: 3.2 mEq/L — ABNORMAL LOW (ref 3.5–5.1)
Potassium: 3.7 mEq/L (ref 3.5–5.1)
Sodium: 142 mEq/L (ref 135–145)
Sodium: 142 mEq/L (ref 135–145)
Total Bilirubin: 1 mg/dL (ref 0.3–1.2)
Total Bilirubin: 2.2 mg/dL — ABNORMAL HIGH (ref 0.3–1.2)
Total Protein: 5.7 g/dL — ABNORMAL LOW (ref 6.0–8.3)
Total Protein: 7.2 g/dL (ref 6.0–8.3)

## 2010-05-11 LAB — PROTIME-INR
INR: 1.15 (ref 0.00–1.49)
Prothrombin Time: 14.6 seconds (ref 11.6–15.2)

## 2010-05-11 LAB — DIFFERENTIAL
Basophils Absolute: 0 10*3/uL (ref 0.0–0.1)
Basophils Absolute: 0 10*3/uL (ref 0.0–0.1)
Basophils Absolute: 0 10*3/uL (ref 0.0–0.1)
Basophils Relative: 0 % (ref 0–1)
Basophils Relative: 0 % (ref 0–1)
Basophils Relative: 0 % (ref 0–1)
Eosinophils Absolute: 0 10*3/uL (ref 0.0–0.7)
Eosinophils Absolute: 0 10*3/uL (ref 0.0–0.7)
Eosinophils Absolute: 0 10*3/uL (ref 0.0–0.7)
Eosinophils Relative: 0 % (ref 0–5)
Eosinophils Relative: 0 % (ref 0–5)
Eosinophils Relative: 0 % (ref 0–5)
Lymphocytes Relative: 23 % (ref 12–46)
Lymphocytes Relative: 13 % (ref 12–46)
Lymphocytes Relative: 17 % (ref 12–46)
Lymphs Abs: 2.6 10*3/uL (ref 0.7–4.0)
Lymphs Abs: 1.1 10*3/uL (ref 0.7–4.0)
Lymphs Abs: 1.7 10*3/uL (ref 0.7–4.0)
Monocytes Absolute: 0.7 10*3/uL (ref 0.1–1.0)
Monocytes Absolute: 0.5 10*3/uL (ref 0.1–1.0)
Monocytes Absolute: 0.9 10*3/uL (ref 0.1–1.0)
Monocytes Relative: 6 % (ref 3–12)
Monocytes Relative: 7 % (ref 3–12)
Monocytes Relative: 9 % (ref 3–12)
Neutro Abs: 7.9 10*3/uL — ABNORMAL HIGH (ref 1.7–7.7)
Neutro Abs: 10.3 10*3/uL — ABNORMAL HIGH (ref 1.7–7.7)
Neutro Abs: 4.5 10*3/uL (ref 1.7–7.7)
Neutrophils Relative %: 71 % (ref 43–77)
Neutrophils Relative %: 74 % (ref 43–77)
Neutrophils Relative %: 80 % — ABNORMAL HIGH (ref 43–77)

## 2010-05-11 LAB — VALPROIC ACID LEVEL
Valproic Acid Lvl: <10 ug/mL — ABNORMAL LOW (ref 50.0–100.0)
Valproic Acid Lvl: <10 ug/mL — ABNORMAL LOW (ref 50.0–100.0)

## 2010-05-11 LAB — RAPID URINE DRUG SCREEN, HOSP PERFORMED
Amphetamines: NOT DETECTED
Amphetamines: NOT DETECTED
Barbiturates: NOT DETECTED
Barbiturates: NOT DETECTED
Benzodiazepines: NOT DETECTED
Benzodiazepines: POSITIVE — AB
Cocaine: NOT DETECTED
Cocaine: POSITIVE — AB
Opiates: NOT DETECTED
Opiates: NOT DETECTED
Tetrahydrocannabinol: POSITIVE — AB
Tetrahydrocannabinol: POSITIVE — AB

## 2010-05-11 LAB — GLUCOSE, CAPILLARY
Glucose-Capillary: 90 mg/dL (ref 70–99)
Glucose-Capillary: 97 mg/dL (ref 70–99)
Glucose-Capillary: 54 mg/dL — ABNORMAL LOW (ref 70–99)
Glucose-Capillary: 68 mg/dL — ABNORMAL LOW (ref 70–99)

## 2010-05-11 LAB — URINALYSIS, ROUTINE W REFLEX MICROSCOPIC
Bilirubin Urine: NEGATIVE
Bilirubin Urine: NEGATIVE
Glucose, UA: NEGATIVE mg/dL
Glucose, UA: NEGATIVE mg/dL
Hgb urine dipstick: NEGATIVE
Hgb urine dipstick: NEGATIVE
Ketones, ur: 40 mg/dL — AB
Ketones, ur: >80 mg/dL — AB
Nitrite: NEGATIVE
Nitrite: NEGATIVE
Protein, ur: NEGATIVE mg/dL
Protein, ur: NEGATIVE mg/dL
Specific Gravity, Urine: 1.012 (ref 1.005–1.030)
Specific Gravity, Urine: 1.013 (ref 1.005–1.030)
Urobilinogen, UA: 0.2 mg/dL (ref 0.0–1.0)
Urobilinogen, UA: 1 mg/dL (ref 0.0–1.0)
pH: 5.5 (ref 5.0–8.0)
pH: 5.5 (ref 5.0–8.0)

## 2010-05-11 LAB — BASIC METABOLIC PANEL
BUN: 12 mg/dL (ref 6–23)
CO2: 20 mEq/L (ref 19–32)
Calcium: 9.9 mg/dL (ref 8.4–10.5)
Chloride: 105 mEq/L (ref 96–112)
Creatinine, Ser: 1.52 mg/dL — ABNORMAL HIGH (ref 0.4–1.5)
GFR calc Af Amer: >60 mL/min (ref >60)
GFR calc non Af Amer: 53 mL/min — ABNORMAL LOW (ref >60)
Glucose, Bld: 63 mg/dL — ABNORMAL LOW (ref 70–99)
Potassium: 3.8 mEq/L (ref 3.5–5.1)
Sodium: 138 mEq/L (ref 135–145)

## 2010-05-11 LAB — POCT CARDIAC MARKERS
CKMB, poc: 2.6 ng/mL (ref 1.0–8.0)
Myoglobin, poc: >500 ng/mL (ref 12–200)
Troponin i, poc: <0.05 ng/mL (ref 0.00–0.09)

## 2010-05-11 LAB — CK TOTAL AND CKMB (NOT AT ARMC)
CK, MB: 4.3 ng/mL — ABNORMAL HIGH (ref 0.3–4.0)
Relative Index: 0.5 (ref 0.0–2.5)
Total CK: 824 U/L — ABNORMAL HIGH (ref 7–232)

## 2010-05-11 LAB — TRICYCLICS SCREEN, URINE: TCA Scrn: NOT DETECTED

## 2010-05-11 LAB — APTT: aPTT: 30 seconds (ref 24–37)

## 2010-05-11 LAB — LIPASE, BLOOD: Lipase: 17 U/L (ref 11–59)

## 2010-05-11 LAB — ACETAMINOPHEN LEVEL: Acetaminophen (Tylenol), Serum: <10 ug/mL — ABNORMAL LOW (ref 10–30)

## 2010-05-11 LAB — ETHANOL
Alcohol, Ethyl (B): <5 mg/dL (ref 0–10)
Alcohol, Ethyl (B): <5 mg/dL (ref 0–10)
Alcohol, Ethyl (B): <5 mg/dL (ref 0–10)

## 2010-05-11 LAB — LITHIUM LEVEL: Lithium Lvl: 1.24 mEq/L (ref 0.80–1.40)

## 2010-05-11 LAB — SALICYLATE LEVEL: Salicylate Lvl: <4 mg/dL (ref 2.8–20.0)

## 2010-05-23 LAB — CBC
HCT: 46.1 % (ref 39.0–52.0)
HCT: 48.1 % (ref 39.0–52.0)
Hemoglobin: 15.3 g/dL (ref 13.0–17.0)
Hemoglobin: 15.6 g/dL (ref 13.0–17.0)
MCHC: 32.4 g/dL (ref 30.0–36.0)
MCHC: 33.1 g/dL (ref 30.0–36.0)
MCV: 94.4 fL (ref 78.0–100.0)
MCV: 96.1 fL (ref 78.0–100.0)
Platelets: 147 10*3/uL — ABNORMAL LOW (ref 150–400)
Platelets: 149 10*3/uL — ABNORMAL LOW (ref 150–400)
RBC: 4.88 MIL/uL (ref 4.22–5.81)
RBC: 5.01 MIL/uL (ref 4.22–5.81)
RDW: 12.8 % (ref 11.5–15.5)
RDW: 13 % (ref 11.5–15.5)
WBC: 6.1 10*3/uL (ref 4.0–10.5)
WBC: 9 10*3/uL (ref 4.0–10.5)

## 2010-05-23 LAB — RAPID URINE DRUG SCREEN, HOSP PERFORMED
Amphetamines: NOT DETECTED
Barbiturates: NOT DETECTED
Benzodiazepines: NOT DETECTED
Cocaine: POSITIVE — AB
Opiates: NOT DETECTED
Tetrahydrocannabinol: POSITIVE — AB

## 2010-05-23 LAB — DIFFERENTIAL
Basophils Absolute: 0 10*3/uL (ref 0.0–0.1)
Basophils Relative: 0 % (ref 0–1)
Eosinophils Absolute: 0 10*3/uL (ref 0.0–0.7)
Eosinophils Relative: 0 % (ref 0–5)
Lymphocytes Relative: 11 % — ABNORMAL LOW (ref 12–46)
Lymphs Abs: 1 10*3/uL (ref 0.7–4.0)
Monocytes Absolute: 1 10*3/uL (ref 0.1–1.0)
Monocytes Relative: 11 % (ref 3–12)
Neutro Abs: 7.1 10*3/uL (ref 1.7–7.7)
Neutrophils Relative %: 78 % — ABNORMAL HIGH (ref 43–77)

## 2010-05-23 LAB — HEPATIC FUNCTION PANEL
ALT: 16 U/L (ref 0–53)
AST: 18 U/L (ref 0–37)
Albumin: 3.5 g/dL (ref 3.5–5.2)
Alkaline Phosphatase: 63 U/L (ref 39–117)
Bilirubin, Direct: 0.2 mg/dL (ref 0.0–0.3)
Indirect Bilirubin: 0.4 mg/dL (ref 0.3–0.9)
Total Bilirubin: 0.6 mg/dL (ref 0.3–1.2)
Total Protein: 6.7 g/dL (ref 6.0–8.3)

## 2010-05-23 LAB — BASIC METABOLIC PANEL
BUN: 8 mg/dL (ref 6–23)
CO2: 23 mEq/L (ref 19–32)
Calcium: 9.2 mg/dL (ref 8.4–10.5)
Chloride: 108 mEq/L (ref 96–112)
Creatinine, Ser: 1.21 mg/dL (ref 0.4–1.5)
GFR calc Af Amer: >60 mL/min (ref >60)
GFR calc non Af Amer: >60 mL/min (ref >60)
Glucose, Bld: 86 mg/dL (ref 70–99)
Potassium: 3.5 mEq/L (ref 3.5–5.1)
Sodium: 138 mEq/L (ref 135–145)

## 2010-05-23 LAB — URINALYSIS, ROUTINE W REFLEX MICROSCOPIC
Glucose, UA: NEGATIVE mg/dL
Ketones, ur: >80 mg/dL — AB
Nitrite: NEGATIVE
Protein, ur: NEGATIVE mg/dL
Specific Gravity, Urine: 1.019 (ref 1.005–1.030)
Urobilinogen, UA: 1 mg/dL (ref 0.0–1.0)
pH: 6 (ref 5.0–8.0)

## 2010-05-23 LAB — URINE MICROSCOPIC-ADD ON

## 2010-05-23 LAB — ETHANOL: Alcohol, Ethyl (B): <5 mg/dL (ref 0–10)

## 2010-05-25 ENCOUNTER — Inpatient Hospital Stay (HOSPITAL_COMMUNITY)
Admission: EM | Admit: 2010-05-25 | Discharge: 2010-05-26 | DRG: 918 | Disposition: A | Discharge status: Home / Self Care | Payer: Medicaid Other | Attending: Internal Medicine | Admitting: Internal Medicine

## 2010-05-25 DIAGNOSIS — R4182 Altered mental status, unspecified: Secondary | ICD-10-CM | POA: Diagnosis present

## 2010-05-25 DIAGNOSIS — T4391XA Poisoning by unspecified psychotropic drug, accidental (unintentional), initial encounter: Secondary | ICD-10-CM | POA: Diagnosis present

## 2010-05-25 DIAGNOSIS — E876 Hypokalemia: Secondary | ICD-10-CM | POA: Diagnosis present

## 2010-05-25 DIAGNOSIS — T438X1A Poisoning by other psychotropic drugs, accidental (unintentional), initial encounter: Secondary | ICD-10-CM | POA: Diagnosis present

## 2010-05-25 DIAGNOSIS — T438X4A Poisoning by other psychotropic drugs, undetermined, initial encounter: Secondary | ICD-10-CM | POA: Diagnosis present

## 2010-05-25 DIAGNOSIS — Y92009 Unspecified place in unspecified non-institutional (private) residence as the place of occurrence of the external cause: Secondary | ICD-10-CM

## 2010-05-25 DIAGNOSIS — F319 Bipolar disorder, unspecified: Secondary | ICD-10-CM | POA: Diagnosis present

## 2010-05-25 DIAGNOSIS — G40909 Epilepsy, unspecified, not intractable, without status epilepticus: Secondary | ICD-10-CM | POA: Diagnosis present

## 2010-05-25 DIAGNOSIS — T426X1A Poisoning by other antiepileptic and sedative-hypnotic drugs, accidental (unintentional), initial encounter: Principal | ICD-10-CM | POA: Diagnosis present

## 2010-05-25 DIAGNOSIS — F259 Schizoaffective disorder, unspecified: Secondary | ICD-10-CM | POA: Diagnosis present

## 2010-05-25 DIAGNOSIS — F17200 Nicotine dependence, unspecified, uncomplicated: Secondary | ICD-10-CM | POA: Diagnosis present

## 2010-05-25 DIAGNOSIS — T434X1A Poisoning by butyrophenone and thiothixene neuroleptics, accidental (unintentional), initial encounter: Secondary | ICD-10-CM | POA: Diagnosis present

## 2010-05-25 LAB — BASIC METABOLIC PANEL
BUN: 6 mg/dL (ref 6–23)
CO2: 24 mEq/L (ref 19–32)
Calcium: 9.2 mg/dL (ref 8.4–10.5)
Chloride: 110 mEq/L (ref 96–112)
Creatinine, Ser: 1.11 mg/dL (ref 0.4–1.5)
GFR calc Af Amer: >60 mL/min (ref >60)
GFR calc non Af Amer: >60 mL/min (ref >60)
Glucose, Bld: 98 mg/dL (ref 70–99)
Potassium: 3.7 mEq/L (ref 3.5–5.1)
Sodium: 140 mEq/L (ref 135–145)

## 2010-05-25 LAB — CBC
HCT: 46.1 % (ref 39.0–52.0)
HCT: 45.1 % (ref 39.0–52.0)
HCT: 47.1 % (ref 39.0–52.0)
HCT: 43.2 % (ref 39.0–52.0)
Hemoglobin: 15.4 g/dL (ref 13.0–17.0)
Hemoglobin: 14.9 g/dL (ref 13.0–17.0)
Hemoglobin: 16 g/dL (ref 13.0–17.0)
Hemoglobin: 14.8 g/dL (ref 13.0–17.0)
MCH: 31.6 pg (ref 26.0–34.0)
MCHC: 33.5 g/dL (ref 30.0–36.0)
MCHC: 33 g/dL (ref 30.0–36.0)
MCHC: 34 g/dL (ref 30.0–36.0)
MCHC: 34.2 g/dL (ref 30.0–36.0)
MCV: 96 fL (ref 78.0–100.0)
MCV: 95.6 fL (ref 78.0–100.0)
MCV: 96.1 fL (ref 78.0–100.0)
MCV: 95.3 fL (ref 78.0–100.0)
Platelets: 155 10*3/uL (ref 150–400)
Platelets: 147 10*3/uL — ABNORMAL LOW (ref 150–400)
Platelets: 156 10*3/uL (ref 150–400)
Platelets: 164 10*3/uL (ref 150–400)
RBC: 4.8 MIL/uL (ref 4.22–5.81)
RBC: 4.72 MIL/uL (ref 4.22–5.81)
RBC: 4.9 MIL/uL (ref 4.22–5.81)
RBC: 4.54 MIL/uL (ref 4.22–5.81)
RDW: 13 % (ref 11.5–15.5)
RDW: 12.2 % (ref 11.5–15.5)
RDW: 12.9 % (ref 11.5–15.5)
RDW: 12.1 % (ref 11.5–15.5)
WBC: 8.7 10*3/uL (ref 4.0–10.5)
WBC: 8.5 10*3/uL (ref 4.0–10.5)
WBC: 7.9 10*3/uL (ref 4.0–10.5)
WBC: 8.1 10*3/uL (ref 4.0–10.5)

## 2010-05-25 LAB — COMPREHENSIVE METABOLIC PANEL
ALT: 14 U/L (ref 0–53)
ALT: 17 U/L (ref 0–53)
AST: 20 U/L (ref 0–37)
AST: 20 U/L (ref 0–37)
Albumin: 3.7 g/dL (ref 3.5–5.2)
Albumin: 3.8 g/dL (ref 3.5–5.2)
Alkaline Phosphatase: 59 U/L (ref 39–117)
Alkaline Phosphatase: 64 U/L (ref 39–117)
BUN: 9 mg/dL (ref 6–23)
BUN: 5 mg/dL — ABNORMAL LOW (ref 6–23)
CO2: 28 mEq/L (ref 19–32)
CO2: 31 mEq/L (ref 19–32)
Calcium: 9.4 mg/dL (ref 8.4–10.5)
Calcium: 9.6 mg/dL (ref 8.4–10.5)
Chloride: 107 mEq/L (ref 96–112)
Chloride: 107 mEq/L (ref 96–112)
Creatinine, Ser: 1.41 mg/dL (ref 0.4–1.5)
Creatinine, Ser: 1.42 mg/dL (ref 0.4–1.5)
GFR calc Af Amer: >60 mL/min (ref >60)
GFR calc Af Amer: >60 mL/min (ref >60)
GFR calc non Af Amer: 58 mL/min — ABNORMAL LOW (ref >60)
GFR calc non Af Amer: 58 mL/min — ABNORMAL LOW (ref >60)
Glucose, Bld: 86 mg/dL (ref 70–99)
Glucose, Bld: 118 mg/dL — ABNORMAL HIGH (ref 70–99)
Potassium: 3.2 mEq/L — ABNORMAL LOW (ref 3.5–5.1)
Potassium: 3.6 mEq/L (ref 3.5–5.1)
Sodium: 141 mEq/L (ref 135–145)
Sodium: 143 mEq/L (ref 135–145)
Total Bilirubin: 1.1 mg/dL (ref 0.3–1.2)
Total Bilirubin: 0.9 mg/dL (ref 0.3–1.2)
Total Protein: 6.7 g/dL (ref 6.0–8.3)
Total Protein: 6.9 g/dL (ref 6.0–8.3)

## 2010-05-25 LAB — GLUCOSE, CAPILLARY
Glucose-Capillary: 162 mg/dL — ABNORMAL HIGH (ref 70–99)
Glucose-Capillary: 93 mg/dL (ref 70–99)
Glucose-Capillary: 96 mg/dL (ref 70–99)

## 2010-05-25 LAB — RAPID URINE DRUG SCREEN, HOSP PERFORMED
Amphetamines: NOT DETECTED
Amphetamines: NOT DETECTED
Amphetamines: NOT DETECTED
Barbiturates: NOT DETECTED
Barbiturates: NOT DETECTED
Barbiturates: NOT DETECTED
Benzodiazepines: NOT DETECTED
Benzodiazepines: NOT DETECTED
Benzodiazepines: NOT DETECTED
Cocaine: POSITIVE — AB
Cocaine: NOT DETECTED
Cocaine: NOT DETECTED
Opiates: NOT DETECTED
Opiates: NOT DETECTED
Opiates: NOT DETECTED
Tetrahydrocannabinol: NOT DETECTED
Tetrahydrocannabinol: POSITIVE — AB
Tetrahydrocannabinol: POSITIVE — AB

## 2010-05-25 LAB — DIFFERENTIAL
Basophils Absolute: 0 10*3/uL (ref 0.0–0.1)
Basophils Absolute: 0 10*3/uL (ref 0.0–0.1)
Basophils Absolute: 0 10*3/uL (ref 0.0–0.1)
Basophils Relative: 0 % (ref 0–1)
Basophils Relative: 0 % (ref 0–1)
Basophils Relative: 0 % (ref 0–1)
Eosinophils Absolute: 0 10*3/uL (ref 0.0–0.7)
Eosinophils Absolute: 0 10*3/uL (ref 0.0–0.7)
Eosinophils Absolute: 0 10*3/uL (ref 0.0–0.7)
Eosinophils Relative: 0 % (ref 0–5)
Eosinophils Relative: 0 % (ref 0–5)
Eosinophils Relative: 0 % (ref 0–5)
Lymphocytes Relative: 21 % (ref 12–46)
Lymphocytes Relative: 21 % (ref 12–46)
Lymphocytes Relative: 25 % (ref 12–46)
Lymphs Abs: 1.8 10*3/uL (ref 0.7–4.0)
Lymphs Abs: 1.8 10*3/uL (ref 0.7–4.0)
Lymphs Abs: 2 10*3/uL (ref 0.7–4.0)
Monocytes Absolute: 0.6 10*3/uL (ref 0.1–1.0)
Monocytes Absolute: 0.6 10*3/uL (ref 0.1–1.0)
Monocytes Absolute: 0.6 10*3/uL (ref 0.1–1.0)
Monocytes Relative: 7 % (ref 3–12)
Monocytes Relative: 7 % (ref 3–12)
Monocytes Relative: 8 % (ref 3–12)
Neutro Abs: 6.3 10*3/uL (ref 1.7–7.7)
Neutro Abs: 6.1 10*3/uL (ref 1.7–7.7)
Neutro Abs: 5.3 10*3/uL (ref 1.7–7.7)
Neutrophils Relative %: 73 % (ref 43–77)
Neutrophils Relative %: 72 % (ref 43–77)
Neutrophils Relative %: 67 % (ref 43–77)

## 2010-05-25 LAB — ETHANOL
Alcohol, Ethyl (B): <5 mg/dL (ref 0–10)
Alcohol, Ethyl (B): <5 mg/dL (ref 0–10)
Alcohol, Ethyl (B): <5 mg/dL (ref 0–10)

## 2010-05-25 LAB — URINALYSIS, ROUTINE W REFLEX MICROSCOPIC
Bilirubin Urine: NEGATIVE
Bilirubin Urine: NEGATIVE
Glucose, UA: NEGATIVE mg/dL
Glucose, UA: NEGATIVE mg/dL
Hgb urine dipstick: NEGATIVE
Hgb urine dipstick: NEGATIVE
Ketones, ur: NEGATIVE mg/dL
Nitrite: NEGATIVE
Nitrite: NEGATIVE
Protein, ur: NEGATIVE mg/dL
Protein, ur: NEGATIVE mg/dL
Specific Gravity, Urine: 1.018 (ref 1.005–1.030)
Specific Gravity, Urine: 1.016 (ref 1.005–1.030)
Urobilinogen, UA: 1 mg/dL (ref 0.0–1.0)
Urobilinogen, UA: 1 mg/dL (ref 0.0–1.0)
pH: 6 (ref 5.0–8.0)
pH: 6 (ref 5.0–8.0)

## 2010-05-25 LAB — ACETAMINOPHEN LEVEL: Acetaminophen (Tylenol), Serum: <10 ug/mL — ABNORMAL LOW (ref 10–30)

## 2010-05-25 LAB — VALPROIC ACID LEVEL: Valproic Acid Lvl: 130.4 ug/mL — ABNORMAL HIGH (ref 50.0–100.0)

## 2010-05-25 LAB — SALICYLATE LEVEL: Salicylate Lvl: <4 mg/dL (ref 2.8–20.0)

## 2010-05-25 LAB — LITHIUM LEVEL: Lithium Lvl: 1.24 mEq/L (ref 0.80–1.40)

## 2010-05-25 LAB — CLOZAPINE (CLOZARIL): Clozapine Lvl: 502 ng/mL (ref 200–700)

## 2010-05-26 DIAGNOSIS — F259 Schizoaffective disorder, unspecified: Secondary | ICD-10-CM

## 2010-05-26 LAB — CBC
HCT: 45.4 % (ref 39.0–52.0)
Hemoglobin: 14.8 g/dL (ref 13.0–17.0)
MCH: 31.3 pg (ref 26.0–34.0)
MCHC: 32.6 g/dL (ref 30.0–36.0)
MCV: 96 fL (ref 78.0–100.0)
Platelets: 160 10*3/uL (ref 150–400)
RBC: 4.73 MIL/uL (ref 4.22–5.81)
RDW: 12.1 % (ref 11.5–15.5)
WBC: 7.4 10*3/uL (ref 4.0–10.5)

## 2010-05-26 LAB — BASIC METABOLIC PANEL
BUN: 7 mg/dL (ref 6–23)
CO2: 28 mEq/L (ref 19–32)
Calcium: 9.2 mg/dL (ref 8.4–10.5)
Chloride: 112 mEq/L (ref 96–112)
Creatinine, Ser: 1.23 mg/dL (ref 0.4–1.5)
GFR calc Af Amer: >60 mL/min (ref >60)
GFR calc non Af Amer: >60 mL/min (ref >60)
Glucose, Bld: 88 mg/dL (ref 70–99)
Potassium: 3.8 mEq/L (ref 3.5–5.1)
Sodium: 143 mEq/L (ref 135–145)

## 2010-05-26 LAB — LITHIUM LEVEL
Lithium Lvl: 0.37 mEq/L — ABNORMAL LOW (ref 0.80–1.40)
Lithium Lvl: 0.71 mEq/L — ABNORMAL LOW (ref 0.80–1.40)

## 2010-05-26 LAB — VALPROIC ACID LEVEL: Valproic Acid Lvl: 75.2 ug/mL (ref 50.0–100.0)

## 2010-05-26 LAB — MRSA PCR SCREENING: MRSA by PCR: NEGATIVE

## 2010-05-30 NOTE — H&P (Signed)
[name], CLERK           ACCOUNT NO.:  [id number]  MEDICAL RECORD NO.:  [id number]           PATIENT TYPE:  E  LOCATION:  WLED                         FACILITY:  [facility]  PHYSICIAN:  Erick Blinks, MD     DATE OF BIRTH:  1975-07-29  DATE OF ADMISSION:  05/25/2010 DATE OF DISCHARGE:                             HISTORY & PHYSICAL   PRIMARY CARE PHYSICIAN:  Patient does not have a primary care physician.  CHIEF COMPLAINT:  Overdose.  HISTORY OF PRESENT ILLNESS:  This is a 35 year old African-American male, who was brought to the emergency room after admitting to an overdose.  Patient was reportedly in an argument with his girlfriend. He had taken a large amount of Depakote, lithium and Haldol.  He reports that after taking these medications he had episode of nausea and vomiting and vomited majority of the medication out.  He says that he took the medications because he was angry at his girlfriend.  He denies any complaints at this time including nausea, vomiting, abdominal pain, dizziness, shortness of breath, cough, chest pain, diarrhea, abdominal pain, dysuria, unilateral weakness or numbness, fever or changes in vision.  The patient has been referred for admission.  PAST MEDICAL HISTORY: 1. Bipolar disorder. 2. Schizophrenia. 3. Seizure disorder. 4. Possible asthma.  ALLERGIES:  No known drug allergies.  MEDICATIONS PRIOR TO ADMISSION:  These will have to be verified by pharmacy: 1. Depakote. 2. Haldol. 3. Lithium. 4. Cogentin. 5. Albuterol inhaler. 6. Keppra.  SOCIAL HISTORY:  Patient smokes over a pack of cigarettes a day.  He reports drinking 4-5 bottles of hard liquor daily.  He says that when he stops drinking, he has not had any episodes of withdrawal and he smokes marijuana.  FAMILY HISTORY:  Patient's father was shot at a younger age.  His mother has a history of diabetes.  REVIEW OF SYSTEMS:  As per HPI.  PHYSICAL EXAMINATION:  VITAL SIGNS:   Blood pressure 113/44, respiratory rate of 16, heart rate of 80, temperature 98, pulse ox of 100% on room air. GENERAL:  Patient is very somnolent, at first, he was difficult to arouse, but he did awake and answered questions appropriately. HEENT:  Normocephalic, atraumatic.  Pupils are equal, round and reactive to light. NECK:  Supple. CHEST:  Clear to auscultation bilaterally. CARDIAC EXAMINATION:  Shows S1, S2 with regular rate and rhythm. ABDOMEN:  Soft, nontender.  Bowel sounds are active. EXTREMITIES:  Showed no cyanosis, clubbing or edema. NEUROLOGICALLY:  Patient has equal strength bilaterally, although he is very somnolent, he answers questions appropriately.  LABORATORY DATA:  Depakote level of 130.  Lithium level of 1.24. Salicylate less than 4.  Alcohol of less than 5.  Sodium of 141, potassium 3.2, chloride 107, bicarb 28, glucose 86, BUN 9, creatinine 1.41, calcium of 9.4.  Liver function tests within normal limits. Tylenol less than 10.  Urine drug screen positive for cannabis.  WBC 8.5, hemoglobin 14.9, platelet count of 147,000.  ASSESSMENT AND PLAN:  Multisubstance overdose:  The case was discussed by the Emergency Room physician with Poison Control, was recommended observation for QT prolongation as well as intravenous  fluids.  We will admit the patient to the hospital due to his somnolence and decreased mental status.  We will admit to the step-down unit overnight for close observation and if stable, he can be moved to telemetry bed tomorrow. We will continue with intravenous hydration.  We will recheck Depakote as well as lithium level in 6 hours.  We will continue his Keppra for his seizures, but the remainder of his medications will need to be verified by the pharmacy.  We will also kept him on a one-to-one sitter and we have Psychiatry to see him in the morning.  We will also give him a nicotine patch for his tobacco abuse.  Further orders will be per  the clinical course.     Erick Blinks, MD     JM/MEDQ  D:  05/25/2010  T:  05/25/2010  Job:  [id number]  Electronically Signed by Durward Mallard Leyah Bocchino  on 05/30/2010 04:21:23 PM

## 2010-05-30 NOTE — Consult Note (Signed)
[name] [name], [name] [name]           ACCOUNT NO.:  [id number]  MEDICAL RECORD NO.:  [id number]           PATIENT TYPE:  I  LOCATION:  1514                         FACILITY:  [facility]  PHYSICIAN:  Eulogio Ditch, MD DATE OF BIRTH:  21-Oct-1975  DATE OF CONSULTATION:  05/26/2010 DATE OF DISCHARGE:                                CONSULTATION   REASON FOR CONSULTATION:  Overdose.  HISTORY OF PRESENT ILLNESS:  A 35 year old African American male well known to [employer].  The patient has a history of schizoaffective disorder.  The patient overdosed on Depakote, lithium, and Haldol.  The patient was seen with the in-charge nurse.  The patient told us that he was not trying to kill himself.  He just had an argument with his girlfriend.  He was just trying to calm himself down with the medication.  The patient is very logical and goal directed during the interview, not internally preoccupied, was able to make a good eye contact along with good conversation.  I have seen this patient before when he was in manic and psychotic episode.  The patient is doing much better than before.  The patient told me that he is followed by the ACT team in the outpatient setting.  The patient told me that he is having good relationship with the girlfriend because of him taking his medication and was able to stay out of the hospital for a long period of time.  PAST MEDICAL HISTORY: 1. Seizure disorder. 2. Asthma.  ALLERGIES:  No known drug allergies.  MENTAL STATUS EXAMINATION:  The patient is very calm, cooperative during the interview, fair eye contact, pleasant on approach.  No psychomotor agitation or retardation noted during the interview.  Speech is normal in rate, rhythm, and volume.  Mood euthymic.  Affect mood congruent. Thought process logical and goal directed.  Thought content not suicidal or homicidal, not delusional.  Thought perception, no audiovisual hallucination reported, not  internally preoccupied.  Cognition alert, awake, and oriented x3.  Memory immediate, recent, and remote intact. Fund of knowledge fair.  Attention and concentration good.  Abstraction ability poor.  Insight and judgment fair.  DIAGNOSIS:  AXIS I:  Schizoaffective disorder. AXIS II:  Deferred. AXIS III:  See medical notes. AXIS IV:  Chronic mental issues. AXIS V:  50.  RECOMMENDATIONS: 1. The patient at this time denies that he was trying to kill himself. 2. The patient is very logical and goal directed, is not having any     psychotic or manic symptoms. 3. The patient does not want to be admitted to the [facility]. 4. He told me that he is compliant with his medication and wants to     follow up in the outpatient setting.  He does not want to be     followed up or admitted to [facility] 5. As the patient does not meet criteria to be admitted to [facility].  The     patient will be discharged to follow up in the outpatient setting.     Sitter can be discontinued at this time.  The patient's girlfriend     should be informed  about the discharge.  Thanks for involving me taking care of this patient.     Eulogio Ditch, MD     SA/MEDQ  D:  05/26/2010  T:  05/26/2010  Job:  [id number]  Electronically Signed by Eulogio Ditch  on 05/30/2010 11:50:05 AM

## 2010-06-12 ENCOUNTER — Emergency Department (HOSPITAL_COMMUNITY)
Admission: EM | Admit: 2010-06-12 | Discharge: 2010-06-13 | Disposition: A | Discharge status: Home / Self Care | Payer: Medicaid Other | Attending: Emergency Medicine | Admitting: Emergency Medicine

## 2010-06-12 ENCOUNTER — Emergency Department (HOSPITAL_COMMUNITY): Payer: Medicaid Other

## 2010-06-12 DIAGNOSIS — Z046 Encounter for general psychiatric examination, requested by authority: Secondary | ICD-10-CM | POA: Insufficient documentation

## 2010-06-12 DIAGNOSIS — F209 Schizophrenia, unspecified: Secondary | ICD-10-CM | POA: Insufficient documentation

## 2010-06-12 DIAGNOSIS — F3110 Bipolar disorder, current episode manic without psychotic features, unspecified: Secondary | ICD-10-CM | POA: Insufficient documentation

## 2010-06-12 DIAGNOSIS — M25519 Pain in unspecified shoulder: Secondary | ICD-10-CM | POA: Insufficient documentation

## 2010-06-12 DIAGNOSIS — F259 Schizoaffective disorder, unspecified: Secondary | ICD-10-CM

## 2010-06-12 LAB — RAPID URINE DRUG SCREEN, HOSP PERFORMED
Amphetamines: NOT DETECTED
Barbiturates: NOT DETECTED
Benzodiazepines: NOT DETECTED
Cocaine: NOT DETECTED
Opiates: NOT DETECTED
Tetrahydrocannabinol: POSITIVE — AB

## 2010-06-12 LAB — CBC
HCT: 49.1 % (ref 39.0–52.0)
Hemoglobin: 17 g/dL (ref 13.0–17.0)
MCH: 32.2 pg (ref 26.0–34.0)
MCHC: 34.6 g/dL (ref 30.0–36.0)
MCV: 93 fL (ref 78.0–100.0)
Platelets: 139 10*3/uL — ABNORMAL LOW (ref 150–400)
RBC: 5.28 MIL/uL (ref 4.22–5.81)
RDW: 12 % (ref 11.5–15.5)
WBC: 7.1 10*3/uL (ref 4.0–10.5)

## 2010-06-12 LAB — COMPREHENSIVE METABOLIC PANEL
ALT: 15 U/L (ref 0–53)
AST: 25 U/L (ref 0–37)
Albumin: 4.2 g/dL (ref 3.5–5.2)
Alkaline Phosphatase: 68 U/L (ref 39–117)
BUN: 13 mg/dL (ref 6–23)
CO2: 26 mEq/L (ref 19–32)
Calcium: 10 mg/dL (ref 8.4–10.5)
Chloride: 106 mEq/L (ref 96–112)
Creatinine, Ser: 1.29 mg/dL (ref 0.4–1.5)
GFR calc Af Amer: >60 mL/min (ref >60)
GFR calc non Af Amer: >60 mL/min (ref >60)
Glucose, Bld: 97 mg/dL (ref 70–99)
Potassium: 3.8 mEq/L (ref 3.5–5.1)
Sodium: 140 mEq/L (ref 135–145)
Total Bilirubin: 1.2 mg/dL (ref 0.3–1.2)
Total Protein: 7.7 g/dL (ref 6.0–8.3)

## 2010-06-12 LAB — LITHIUM LEVEL: Lithium Lvl: <0.25 mEq/L — ABNORMAL LOW (ref 0.80–1.40)

## 2010-06-12 LAB — DIFFERENTIAL
Basophils Absolute: 0 10*3/uL (ref 0.0–0.1)
Basophils Relative: 0 % (ref 0–1)
Eosinophils Absolute: 0 10*3/uL (ref 0.0–0.7)
Eosinophils Relative: 0 % (ref 0–5)
Lymphocytes Relative: 34 % (ref 12–46)
Lymphs Abs: 2.4 10*3/uL (ref 0.7–4.0)
Monocytes Absolute: 0.5 10*3/uL (ref 0.1–1.0)
Monocytes Relative: 7 % (ref 3–12)
Neutro Abs: 4.2 10*3/uL (ref 1.7–7.7)
Neutrophils Relative %: 58 % (ref 43–77)

## 2010-06-12 LAB — ETHANOL: Alcohol, Ethyl (B): <5 mg/dL (ref 0–10)

## 2010-06-12 LAB — VALPROIC ACID LEVEL: Valproic Acid Lvl: 46.7 ug/mL — ABNORMAL LOW (ref 50.0–100.0)
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
## 2010-06-13 DIAGNOSIS — F259 Schizoaffective disorder, unspecified: Secondary | ICD-10-CM

## 2010-06-15 ENCOUNTER — Emergency Department (HOSPITAL_COMMUNITY)
Admission: EM | Admit: 2010-06-15 | Discharge: 2010-06-15 | Payer: Medicaid Other | Attending: Emergency Medicine | Admitting: Emergency Medicine

## 2010-06-15 DIAGNOSIS — Z046 Encounter for general psychiatric examination, requested by authority: Secondary | ICD-10-CM | POA: Insufficient documentation

## 2010-06-15 DIAGNOSIS — F319 Bipolar disorder, unspecified: Secondary | ICD-10-CM | POA: Insufficient documentation

## 2010-06-15 DIAGNOSIS — G40909 Epilepsy, unspecified, not intractable, without status epilepticus: Secondary | ICD-10-CM | POA: Insufficient documentation

## 2010-06-15 LAB — CBC
HCT: 49.8 % (ref 39.0–52.0)
Hemoglobin: 16.5 g/dL (ref 13.0–17.0)
MCH: 31.3 pg (ref 26.0–34.0)
MCHC: 33.1 g/dL (ref 30.0–36.0)
MCV: 94.5 fL (ref 78.0–100.0)
Platelets: 165 10*3/uL (ref 150–400)
RBC: 5.27 MIL/uL (ref 4.22–5.81)
RDW: 12 % (ref 11.5–15.5)
WBC: 5.9 10*3/uL (ref 4.0–10.5)

## 2010-06-15 LAB — URINALYSIS, ROUTINE W REFLEX MICROSCOPIC
Bilirubin Urine: NEGATIVE
Glucose, UA: NEGATIVE mg/dL
Hgb urine dipstick: NEGATIVE
Ketones, ur: NEGATIVE mg/dL
Nitrite: NEGATIVE
Protein, ur: NEGATIVE mg/dL
Specific Gravity, Urine: 1.02 (ref 1.005–1.030)
Urobilinogen, UA: 1 mg/dL (ref 0.0–1.0)
pH: 6 (ref 5.0–8.0)

## 2010-06-15 LAB — COMPREHENSIVE METABOLIC PANEL
ALT: 14 U/L (ref 0–53)
AST: 19 U/L (ref 0–37)
Albumin: 4 g/dL (ref 3.5–5.2)
Alkaline Phosphatase: 64 U/L (ref 39–117)
BUN: 11 mg/dL (ref 6–23)
CO2: 30 mEq/L (ref 19–32)
Calcium: 9.8 mg/dL (ref 8.4–10.5)
Chloride: 109 mEq/L (ref 96–112)
Creatinine, Ser: 1.29 mg/dL (ref 0.4–1.5)
GFR calc Af Amer: >60 mL/min (ref >60)
GFR calc non Af Amer: >60 mL/min (ref >60)
Glucose, Bld: 62 mg/dL — ABNORMAL LOW (ref 70–99)
Potassium: 3.7 mEq/L (ref 3.5–5.1)
Sodium: 143 mEq/L (ref 135–145)
Total Bilirubin: 0.9 mg/dL (ref 0.3–1.2)
Total Protein: 7.1 g/dL (ref 6.0–8.3)

## 2010-06-15 LAB — ETHANOL: Alcohol, Ethyl (B): <5 mg/dL (ref 0–10)

## 2010-06-15 LAB — DIFFERENTIAL
Basophils Absolute: 0 10*3/uL (ref 0.0–0.1)
Basophils Relative: 0 % (ref 0–1)
Eosinophils Absolute: 0 10*3/uL (ref 0.0–0.7)
Eosinophils Relative: 1 % (ref 0–5)
Lymphocytes Relative: 27 % (ref 12–46)
Lymphs Abs: 1.6 10*3/uL (ref 0.7–4.0)
Monocytes Absolute: 0.6 10*3/uL (ref 0.1–1.0)
Monocytes Relative: 9 % (ref 3–12)
Neutro Abs: 3.7 10*3/uL (ref 1.7–7.7)
Neutrophils Relative %: 63 % (ref 43–77)

## 2010-06-15 LAB — RAPID URINE DRUG SCREEN, HOSP PERFORMED
Amphetamines: NOT DETECTED
Barbiturates: NOT DETECTED
Benzodiazepines: NOT DETECTED
Cocaine: NOT DETECTED
Opiates: NOT DETECTED
Tetrahydrocannabinol: POSITIVE — AB

## 2010-06-15 LAB — VALPROIC ACID LEVEL: Valproic Acid Lvl: <10 ug/mL — ABNORMAL LOW (ref 50.0–100.0)

## 2010-06-15 LAB — LITHIUM LEVEL: Lithium Lvl: <0.25 mEq/L — ABNORMAL LOW (ref 0.80–1.40)
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
## 2010-06-16 NOTE — Discharge Summary (Signed)
[name], [name]           ACCOUNT NO.:  [id number]  MEDICAL RECORD NO.:  [id number]           PATIENT TYPE:  I  LOCATION:  1514                         FACILITY:  [facility]  PHYSICIAN:  Baltazar Najjar, MD     DATE OF BIRTH:  1975/10/07  DATE OF ADMISSION:  05/25/2010 DATE OF DISCHARGE:                              DISCHARGE SUMMARY   FINAL DISCHARGE DIAGNOSES: 1. Multidrug overdose. 2. Suspected suicidal attempt ruled out as per Psychiatry. 3. History of bipolar disorder/schizoaffective disorder. 4. Transient mental status changes on presentation, now resolved. 5. History of tobacco abuse. 6. History of seizure disorder. 7. History of asthma.  CONSULTATIONS DURING THIS HOSPITALIZATION:  Psychiatric Service.  The patient was seen by Eulogio Ditch, M.D.  BRIEF ADMITTING HISTORY:  Please refer to the dictated H and P for more details.  On summary, [name] [name] is a 35 year old African American man who was brought into the hospital after he ingested and overdosed on Depakote, lithium, and Haldol.  As per H and P, the patient vomited after he took the medicine and most of the medications that he had took came out in his vomitus and he claimed that he overdosed on his medicine because he was angry at his girlfriend.  HOSPITAL COURSE:  The patient was admitted to step-down unit because of transient somnolence and decreased mental status on presentation.  His Depakote level was 130, and his lithium level was 1.24 on presentation. The patient was observed on the step-down unit and he was hydrated with IV fluid.  His repeat Depakote level came down to 75, and his repeat lithium was 0.71.  The patient's mental status significantly improved, and he is alert, oriented x3 this morning.  There was no arrhythmias or EKG changes observed on telemetry floor.  The patient was seen by Dr. Rogers Blocker from Psychiatric Service, and as per him, he was very logical and goal-directed, not  having any psychotic or manic symptoms.  As per Dr. Rogers Blocker, the patient denies any suicidal attempt and he cleared him for discharge to home with outpatient followup.  I discussed his case with Dr. Rogers Blocker prior to discharge as far as his medication, and Dr. Rogers Blocker recommended to continue his outpatient medications at current doses.  The patient is currently stable and he is stable for discharge.  His repeat lithium level from today is 0.37 which is actually lower than his target range.  The patient was continued on Keppra for his seizure disorder.  There was no seizure noted during this hospitalization.  DISCHARGE MEDICATIONS: 1. Albuterol inhaler 2 puffs inhaled q.4h. p.r.n. 2. Benztropine 0.5 mg p.o. daily as needed. 3. Depakote 500 mg 2 tablets p.o. twice daily. 4. Haldol 5 mg 3 times a day p.o. 5. Haldol decanoate 50 mg 1 injection IM every 3 weeks. 6. Keppra 500 mg p.o. b.i.d. 7. Lithium carbonate 300 mg 2 tablets p.o. q.p.m.  DISCHARGE INSTRUCTIONS:  The patient advised to take medications as prescribed and as instructed by Dr. Rogers Blocker.  The patient to follow up with Dr. Rogers Blocker as an outpatient.  CONDITION ON DISCHARGE:  Stable.  ______________________________ Baltazar Najjar, MD     SA/MEDQ  D:  05/26/2010  T:  05/26/2010  Job:  [id number]  cc:   Eulogio Ditch, MD  Electronically Signed by Hannah Beat MD on 06/16/2010 10:03:51 PM

## 2010-07-03 ENCOUNTER — Emergency Department (HOSPITAL_COMMUNITY)
Admission: EM | Admit: 2010-07-03 | Discharge: 2010-07-04 | Disposition: A | Discharge status: Other Acute Inpatient Hospital | Payer: Medicaid Other | Source: Home / Self Care | Attending: Emergency Medicine | Admitting: Emergency Medicine

## 2010-07-03 DIAGNOSIS — T6592XA Toxic effect of unspecified substance, intentional self-harm, initial encounter: Secondary | ICD-10-CM | POA: Insufficient documentation

## 2010-07-03 DIAGNOSIS — R1013 Epigastric pain: Secondary | ICD-10-CM | POA: Insufficient documentation

## 2010-07-03 DIAGNOSIS — T65891A Toxic effect of other specified substances, accidental (unintentional), initial encounter: Secondary | ICD-10-CM | POA: Insufficient documentation

## 2010-07-03 DIAGNOSIS — Y9210 Unspecified residential institution as the place of occurrence of the external cause: Secondary | ICD-10-CM | POA: Insufficient documentation

## 2010-07-03 DIAGNOSIS — F489 Nonpsychotic mental disorder, unspecified: Secondary | ICD-10-CM | POA: Insufficient documentation

## 2010-07-03 DIAGNOSIS — G40909 Epilepsy, unspecified, not intractable, without status epilepticus: Secondary | ICD-10-CM | POA: Insufficient documentation

## 2010-07-03 LAB — CBC
HCT: 47.6 % (ref 39.0–52.0)
Hemoglobin: 15.9 g/dL (ref 13.0–17.0)
MCH: 31.1 pg (ref 26.0–34.0)
MCHC: 33.4 g/dL (ref 30.0–36.0)
MCV: 93.2 fL (ref 78.0–100.0)
Platelets: 133 10*3/uL — ABNORMAL LOW (ref 150–400)
RBC: 5.11 MIL/uL (ref 4.22–5.81)
RDW: 11.9 % (ref 11.5–15.5)
WBC: 6.9 10*3/uL (ref 4.0–10.5)

## 2010-07-03 LAB — POCT I-STAT, CHEM 8
BUN: 9 mg/dL (ref 6–23)
Calcium, Ion: 1.17 mmol/L (ref 1.12–1.32)
Chloride: 106 mEq/L (ref 96–112)
Creatinine, Ser: 1.2 mg/dL (ref 0.4–1.5)
Glucose, Bld: 93 mg/dL (ref 70–99)
HCT: 50 % (ref 39.0–52.0)
Hemoglobin: 17 g/dL (ref 13.0–17.0)
Potassium: 3.7 mEq/L (ref 3.5–5.1)
Sodium: 142 mEq/L (ref 135–145)
TCO2: 25 mmol/L (ref 0–100)

## 2010-07-03 LAB — DIFFERENTIAL
Basophils Absolute: 0 10*3/uL (ref 0.0–0.1)
Basophils Relative: 0 % (ref 0–1)
Eosinophils Absolute: 0 10*3/uL (ref 0.0–0.7)
Eosinophils Relative: 0 % (ref 0–5)
Lymphocytes Relative: 32 % (ref 12–46)
Lymphs Abs: 2.2 10*3/uL (ref 0.7–4.0)
Monocytes Absolute: 0.7 10*3/uL (ref 0.1–1.0)
Monocytes Relative: 11 % (ref 3–12)
Neutro Abs: 3.9 10*3/uL (ref 1.7–7.7)
Neutrophils Relative %: 57 % (ref 43–77)

## 2010-07-04 ENCOUNTER — Inpatient Hospital Stay (HOSPITAL_COMMUNITY)
Admission: EM | Admit: 2010-07-04 | Discharge: 2010-07-07 | DRG: 918 | Payer: Medicaid Other | Source: Ambulatory Visit | Attending: Internal Medicine | Admitting: Internal Medicine

## 2010-07-04 DIAGNOSIS — Y9210 Unspecified residential institution as the place of occurrence of the external cause: Secondary | ICD-10-CM | POA: Diagnosis present

## 2010-07-04 DIAGNOSIS — X838XXA Intentional self-harm by other specified means, initial encounter: Secondary | ICD-10-CM | POA: Diagnosis present

## 2010-07-04 DIAGNOSIS — F911 Conduct disorder, childhood-onset type: Secondary | ICD-10-CM | POA: Diagnosis present

## 2010-07-04 DIAGNOSIS — F209 Schizophrenia, unspecified: Secondary | ICD-10-CM | POA: Diagnosis present

## 2010-07-04 DIAGNOSIS — K1230 Oral mucositis (ulcerative), unspecified: Secondary | ICD-10-CM | POA: Diagnosis present

## 2010-07-04 DIAGNOSIS — T6592XA Toxic effect of unspecified substance, intentional self-harm, initial encounter: Secondary | ICD-10-CM | POA: Diagnosis present

## 2010-07-04 DIAGNOSIS — K209 Esophagitis, unspecified without bleeding: Secondary | ICD-10-CM | POA: Diagnosis present

## 2010-07-04 DIAGNOSIS — T65891A Toxic effect of other specified substances, accidental (unintentional), initial encounter: Principal | ICD-10-CM | POA: Diagnosis present

## 2010-07-04 DIAGNOSIS — K121 Other forms of stomatitis: Secondary | ICD-10-CM | POA: Diagnosis present

## 2010-07-04 DIAGNOSIS — G40909 Epilepsy, unspecified, not intractable, without status epilepticus: Secondary | ICD-10-CM | POA: Diagnosis present

## 2010-07-04 DIAGNOSIS — F319 Bipolar disorder, unspecified: Secondary | ICD-10-CM | POA: Diagnosis present

## 2010-07-04 LAB — MRSA PCR SCREENING: MRSA by PCR: NEGATIVE

## 2010-07-04 LAB — HEMOGLOBIN AND HEMATOCRIT, BLOOD
HCT: 42.1 % (ref 39.0–52.0)
HCT: 44.3 % (ref 39.0–52.0)
HCT: 46.1 % (ref 39.0–52.0)
Hemoglobin: 14.2 g/dL (ref 13.0–17.0)
Hemoglobin: 14.7 g/dL (ref 13.0–17.0)
Hemoglobin: 15.5 g/dL (ref 13.0–17.0)

## 2010-07-04 LAB — TSH: TSH: 1.071 u[IU]/mL (ref 0.350–4.500)
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
## 2010-07-05 LAB — HEPATIC FUNCTION PANEL
ALT: 11 U/L (ref 0–53)
AST: 15 U/L (ref 0–37)
Albumin: 3.3 g/dL — ABNORMAL LOW (ref 3.5–5.2)
Alkaline Phosphatase: 61 U/L (ref 39–117)
Bilirubin, Direct: 0.1 mg/dL (ref 0.0–0.3)
Indirect Bilirubin: 0.5 mg/dL (ref 0.3–0.9)
Total Bilirubin: 0.6 mg/dL (ref 0.3–1.2)
Total Protein: 6.5 g/dL (ref 6.0–8.3)

## 2010-07-05 LAB — BASIC METABOLIC PANEL
BUN: 8 mg/dL (ref 6–23)
CO2: 29 mEq/L (ref 19–32)
Calcium: 9.7 mg/dL (ref 8.4–10.5)
Chloride: 110 mEq/L (ref 96–112)
Creatinine, Ser: 1.08 mg/dL (ref 0.4–1.5)
GFR calc Af Amer: >60 mL/min (ref >60)
GFR calc non Af Amer: >60 mL/min (ref >60)
Glucose, Bld: 72 mg/dL (ref 70–99)
Potassium: 4.2 mEq/L (ref 3.5–5.1)
Sodium: 145 mEq/L (ref 135–145)

## 2010-07-05 LAB — CBC
HCT: 44.3 % (ref 39.0–52.0)
Hemoglobin: 14.9 g/dL (ref 13.0–17.0)
MCH: 31.6 pg (ref 26.0–34.0)
MCHC: 33.6 g/dL (ref 30.0–36.0)
MCV: 93.9 fL (ref 78.0–100.0)
Platelets: 115 10*3/uL — ABNORMAL LOW (ref 150–400)
RBC: 4.72 MIL/uL (ref 4.22–5.81)
RDW: 11.8 % (ref 11.5–15.5)
WBC: 6.3 10*3/uL (ref 4.0–10.5)

## 2010-07-05 LAB — HEMOGLOBIN AND HEMATOCRIT, BLOOD
HCT: 45.6 % (ref 39.0–52.0)
Hemoglobin: 15.7 g/dL (ref 13.0–17.0)

## 2010-07-05 LAB — LITHIUM LEVEL: Lithium Lvl: <0.25 mEq/L — ABNORMAL LOW (ref 0.80–1.40)

## 2010-07-06 DIAGNOSIS — F259 Schizoaffective disorder, unspecified: Secondary | ICD-10-CM

## 2010-07-06 LAB — HEMOGLOBIN AND HEMATOCRIT, BLOOD
HCT: 46 % (ref 39.0–52.0)
Hemoglobin: 15.7 g/dL (ref 13.0–17.0)

## 2010-07-07 DIAGNOSIS — F319 Bipolar disorder, unspecified: Secondary | ICD-10-CM
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
## 2010-11-22 LAB — DIFFERENTIAL
Basophils Absolute: 0
Basophils Absolute: 0.1
Basophils Relative: 0
Basophils Relative: 1
Eosinophils Absolute: 0
Eosinophils Absolute: 0
Eosinophils Relative: 0
Eosinophils Relative: 0
Lymphocytes Relative: 12
Lymphocytes Relative: 23
Lymphs Abs: 1.2
Lymphs Abs: 2.7
Monocytes Absolute: 1
Monocytes Absolute: 1
Monocytes Relative: 10
Monocytes Relative: 9
Neutro Abs: 7.7
Neutro Abs: 7.9 — ABNORMAL HIGH
Neutrophils Relative %: 78 — ABNORMAL HIGH
Neutrophils Relative %: 67

## 2010-11-22 LAB — POCT I-STAT, CHEM 8
BUN: 4 — ABNORMAL LOW
Calcium, Ion: 1.28
Chloride: 109
Creatinine, Ser: 1.2
Glucose, Bld: 88
HCT: 49
Hemoglobin: 16.7
Potassium: 3.1 — ABNORMAL LOW
Sodium: 144
TCO2: 25

## 2010-11-22 LAB — BASIC METABOLIC PANEL
BUN: 4 — ABNORMAL LOW
CO2: 22
Calcium: 8.4
Chloride: 122 — ABNORMAL HIGH
Creatinine, Ser: 0.91
GFR calc Af Amer: >60
GFR calc non Af Amer: >60
Glucose, Bld: 67 — ABNORMAL LOW
Potassium: 3 — ABNORMAL LOW
Sodium: 149 — ABNORMAL HIGH

## 2010-11-22 LAB — COMPREHENSIVE METABOLIC PANEL
ALT: 15
AST: 16
Albumin: 3.6
Alkaline Phosphatase: 58
BUN: 2 — ABNORMAL LOW
CO2: 25
Calcium: 9.8
Chloride: 109
Creatinine, Ser: 1.04
GFR calc Af Amer: >60
GFR calc non Af Amer: >60
Glucose, Bld: 90
Potassium: 3.3 — ABNORMAL LOW
Sodium: 140
Total Bilirubin: 0.8
Total Protein: 6.1

## 2010-11-22 LAB — CBC
HCT: 45.5
HCT: 46.8
Hemoglobin: 14.9
Hemoglobin: 15.2
MCHC: 32.8
MCHC: 32.4
MCV: 95.1
MCV: 94.4
Platelets: 167
Platelets: 164
RBC: 4.78
RBC: 4.96
RDW: 13.3
RDW: 12.7
WBC: 9.9
WBC: 11.7 — ABNORMAL HIGH

## 2010-11-22 LAB — LIPASE, BLOOD: Lipase: 17

## 2010-11-22 LAB — RAPID URINE DRUG SCREEN, HOSP PERFORMED
Amphetamines: NOT DETECTED
Barbiturates: NOT DETECTED
Benzodiazepines: NOT DETECTED
Cocaine: NOT DETECTED
Opiates: NOT DETECTED
Tetrahydrocannabinol: POSITIVE — AB

## 2010-11-22 LAB — GC/CHLAMYDIA PROBE AMP, GENITAL
Chlamydia, DNA Probe: NEGATIVE
GC Probe Amp, Genital: NEGATIVE

## 2010-11-22 LAB — LITHIUM LEVEL: Lithium Lvl: 0.89

## 2010-11-22 LAB — GLUCOSE, CAPILLARY: Glucose-Capillary: 89

## 2010-11-22 LAB — ETHANOL: Alcohol, Ethyl (B): <5

## 2010-11-22 LAB — VALPROIC ACID LEVEL
Valproic Acid Lvl: 40.3 — ABNORMAL LOW
Valproic Acid Lvl: <10 — ABNORMAL LOW

## 2010-11-28 ENCOUNTER — Emergency Department (HOSPITAL_COMMUNITY): Payer: Medicaid Other

## 2010-11-28 ENCOUNTER — Emergency Department (HOSPITAL_COMMUNITY)
Admission: EM | Admit: 2010-11-28 | Discharge: 2010-11-28 | Payer: Medicaid Other | Attending: Emergency Medicine | Admitting: Emergency Medicine

## 2010-11-28 DIAGNOSIS — F319 Bipolar disorder, unspecified: Secondary | ICD-10-CM | POA: Insufficient documentation

## 2010-11-28 DIAGNOSIS — J45909 Unspecified asthma, uncomplicated: Secondary | ICD-10-CM | POA: Insufficient documentation

## 2010-11-28 DIAGNOSIS — F209 Schizophrenia, unspecified: Secondary | ICD-10-CM | POA: Insufficient documentation

## 2010-11-28 DIAGNOSIS — R569 Unspecified convulsions: Secondary | ICD-10-CM | POA: Insufficient documentation

## 2010-11-28 LAB — BASIC METABOLIC PANEL
BUN: 17 mg/dL (ref 6–23)
CO2: 29 mEq/L (ref 19–32)
Calcium: 11.1 mg/dL — ABNORMAL HIGH (ref 8.4–10.5)
Chloride: 109 mEq/L (ref 96–112)
Creatinine, Ser: 1.08 mg/dL (ref 0.50–1.35)
GFR calc Af Amer: >90 mL/min (ref >90)
GFR calc non Af Amer: 88 mL/min — ABNORMAL LOW (ref >90)
Glucose, Bld: 110 mg/dL — ABNORMAL HIGH (ref 70–99)
Potassium: 4 mEq/L (ref 3.5–5.1)
Sodium: 141 mEq/L (ref 135–145)

## 2010-11-28 LAB — DIFFERENTIAL
Basophils Absolute: 0 10*3/uL (ref 0.0–0.1)
Basophils Relative: 0 % (ref 0–1)
Eosinophils Absolute: 0.2 10*3/uL (ref 0.0–0.7)
Eosinophils Relative: 2 % (ref 0–5)
Lymphocytes Relative: 20 % (ref 12–46)
Lymphs Abs: 1.7 10*3/uL (ref 0.7–4.0)
Monocytes Absolute: 0.9 10*3/uL (ref 0.1–1.0)
Monocytes Relative: 10 % (ref 3–12)
Neutro Abs: 5.8 10*3/uL (ref 1.7–7.7)
Neutrophils Relative %: 68 % (ref 43–77)

## 2010-11-28 LAB — CBC
HCT: 44 % (ref 39.0–52.0)
Hemoglobin: 14.6 g/dL (ref 13.0–17.0)
MCH: 31.9 pg (ref 26.0–34.0)
MCHC: 33.2 g/dL (ref 30.0–36.0)
MCV: 96.1 fL (ref 78.0–100.0)
Platelets: 127 10*3/uL — ABNORMAL LOW (ref 150–400)
RBC: 4.58 MIL/uL (ref 4.22–5.81)
RDW: 12 % (ref 11.5–15.5)
WBC: 8.6 10*3/uL (ref 4.0–10.5)

## 2010-11-28 LAB — GLUCOSE, CAPILLARY: Glucose-Capillary: 109 mg/dL — ABNORMAL HIGH (ref 70–99)

## 2010-12-10 ENCOUNTER — Emergency Department (HOSPITAL_COMMUNITY)
Admission: EM | Admit: 2010-12-10 | Discharge: 2010-12-10 | Disposition: A | Discharge status: Home / Self Care | Attending: Emergency Medicine | Admitting: Emergency Medicine

## 2010-12-10 DIAGNOSIS — F411 Generalized anxiety disorder: Secondary | ICD-10-CM | POA: Insufficient documentation

## 2010-12-10 DIAGNOSIS — G40802 Other epilepsy, not intractable, without status epilepticus: Secondary | ICD-10-CM | POA: Insufficient documentation

## 2010-12-10 LAB — POCT I-STAT, CHEM 8
BUN: 14 mg/dL (ref 6–23)
Calcium, Ion: 1.25 mmol/L (ref 1.12–1.32)
Chloride: 108 mEq/L (ref 96–112)
Creatinine, Ser: 1.1 mg/dL (ref 0.50–1.35)
Glucose, Bld: 100 mg/dL — ABNORMAL HIGH (ref 70–99)
HCT: 47 % (ref 39.0–52.0)
Hemoglobin: 16 g/dL (ref 13.0–17.0)
Potassium: 4.6 mEq/L (ref 3.5–5.1)
Sodium: 142 mEq/L (ref 135–145)
TCO2: 24 mmol/L (ref 0–100)

## 2010-12-10 LAB — VALPROIC ACID LEVEL: Valproic Acid Lvl: <10 ug/mL — ABNORMAL LOW (ref 50.0–100.0)

## 2011-03-09 ENCOUNTER — Emergency Department (HOSPITAL_COMMUNITY)
Admission: EM | Admit: 2011-03-09 | Discharge: 2011-03-09 | Disposition: A | Discharge status: Home / Self Care | Payer: Medicaid Other | Attending: Emergency Medicine | Admitting: Emergency Medicine

## 2011-03-09 ENCOUNTER — Emergency Department (HOSPITAL_COMMUNITY): Payer: Medicaid Other

## 2011-03-09 DIAGNOSIS — R112 Nausea with vomiting, unspecified: Secondary | ICD-10-CM | POA: Insufficient documentation

## 2011-03-09 DIAGNOSIS — R1032 Left lower quadrant pain: Secondary | ICD-10-CM | POA: Insufficient documentation

## 2011-03-09 DIAGNOSIS — R1012 Left upper quadrant pain: Secondary | ICD-10-CM | POA: Insufficient documentation

## 2011-03-09 DIAGNOSIS — K5900 Constipation, unspecified: Secondary | ICD-10-CM | POA: Insufficient documentation

## 2011-03-09 LAB — COMPREHENSIVE METABOLIC PANEL
ALT: 19 U/L (ref 0–53)
AST: 18 U/L (ref 0–37)
Albumin: 3.9 g/dL (ref 3.5–5.2)
Alkaline Phosphatase: 87 U/L (ref 39–117)
BUN: 11 mg/dL (ref 6–23)
CO2: 29 mEq/L (ref 19–32)
Calcium: 10.2 mg/dL (ref 8.4–10.5)
Chloride: 106 mEq/L (ref 96–112)
Creatinine, Ser: 0.99 mg/dL (ref 0.50–1.35)
GFR calc Af Amer: >90 mL/min (ref >90)
GFR calc non Af Amer: >90 mL/min (ref >90)
Glucose, Bld: 87 mg/dL (ref 70–99)
Potassium: 3.9 mEq/L (ref 3.5–5.1)
Sodium: 141 mEq/L (ref 135–145)
Total Bilirubin: 0.5 mg/dL (ref 0.3–1.2)
Total Protein: 7.5 g/dL (ref 6.0–8.3)

## 2011-03-09 LAB — URINALYSIS, ROUTINE W REFLEX MICROSCOPIC
Bilirubin Urine: NEGATIVE
Glucose, UA: NEGATIVE mg/dL
Hgb urine dipstick: NEGATIVE
Ketones, ur: NEGATIVE mg/dL
Leukocytes, UA: NEGATIVE
Nitrite: NEGATIVE
Protein, ur: NEGATIVE mg/dL
Specific Gravity, Urine: 1.006 (ref 1.005–1.030)
Urobilinogen, UA: 1 mg/dL (ref 0.0–1.0)
pH: 8 (ref 5.0–8.0)

## 2011-03-09 LAB — DIFFERENTIAL
Basophils Absolute: 0 10*3/uL (ref 0.0–0.1)
Basophils Relative: 0 % (ref 0–1)
Eosinophils Absolute: 0.1 10*3/uL (ref 0.0–0.7)
Eosinophils Relative: 1 % (ref 0–5)
Lymphocytes Relative: 26 % (ref 12–46)
Lymphs Abs: 2 10*3/uL (ref 0.7–4.0)
Monocytes Absolute: 0.5 10*3/uL (ref 0.1–1.0)
Monocytes Relative: 7 % (ref 3–12)
Neutro Abs: 5.2 10*3/uL (ref 1.7–7.7)
Neutrophils Relative %: 66 % (ref 43–77)

## 2011-03-09 LAB — CBC
HCT: 44.5 % (ref 39.0–52.0)
Hemoglobin: 15.1 g/dL (ref 13.0–17.0)
MCH: 31.1 pg (ref 26.0–34.0)
MCHC: 33.9 g/dL (ref 30.0–36.0)
MCV: 91.8 fL (ref 78.0–100.0)
Platelets: 198 10*3/uL (ref 150–400)
RBC: 4.85 MIL/uL (ref 4.22–5.81)
RDW: 12.4 % (ref 11.5–15.5)
WBC: 7.8 10*3/uL (ref 4.0–10.5)

## 2011-03-09 LAB — LIPASE, BLOOD: Lipase: 37 U/L (ref 11–59)

## 2011-03-09 MED ORDER — SODIUM CHLORIDE 0.9 % IV SOLN
999.0000 mL | INTRAVENOUS | Status: DC
  Administered 2011-03-09: 999 mL via INTRAVENOUS

## 2011-03-09 MED ORDER — MAGNESIUM HYDROXIDE 400 MG/5ML PO SUSP: 15.0000 mL | Freq: Every day | ORAL | Status: AC | PRN

## 2011-03-09 MED ORDER — ONDANSETRON HCL 4 MG/2ML IJ SOLN
4.0000 mg | Freq: Once | INTRAMUSCULAR | Status: AC
  Administered 2011-03-09: 4 mg via INTRAVENOUS
  Filled 2011-03-09: qty 2

## 2011-03-09 MED ORDER — HYDROMORPHONE HCL PF 1 MG/ML IJ SOLN
1.0000 mg | Freq: Once | INTRAMUSCULAR | Status: AC
  Administered 2011-03-09: 1 mg via INTRAVENOUS
  Filled 2011-03-09: qty 1

## 2011-03-09 NOTE — ED Notes (Signed)
Patient given pain and nausea medication and is resting while waiting CT scan. NAD at [facility] time.

## 2011-03-09 NOTE — ED Notes (Signed)
Per EMS - patient called on scene at doctors office with abd. Pain x 1 day. Patient states constipation with N&V with pain in the upper left quad. Radiating to the lower left quad. BP 152/100 HR 96 Pain 10 of 10. Patient would not allow full assessment by EMS.

## 2011-03-09 NOTE — ED Notes (Signed)
Patient states that his stomach on the LUQ and radiates to the LLQ also patient has a tooth on the top left beside left front tooth hurting. Patient states he is having constipation x 2 week.  Patient states he vomited blood at the doctors office before coming to the ED. Patient denies chest pain or fever.

## 2011-03-09 NOTE — ED Provider Notes (Signed)
History     CSN: [id number]  Arrival date & time 03/09/11  1514   First MD Initiated Contact with Patient 03/09/11 1528      Chief Complaint  Patient presents with  . Abdominal Pain    (Consider location/radiation/quality/duration/timing/severity/associated sxs/prior treatment) HPI Patient presents emergency room with complaints of abdominal pain. Patient states the pain is been ongoing for about one day. He has had some nausea and vomiting and the pain has been in the left upper abdomen are also moves down to the left lower abdomen. Patient went to a doctor's office where he vomited and was sent to the emergency room for further evaluation patient stateshe's not had any fevers or chills. He has not noticed any blood in his stool he has not had any diarrhea.  He denies any burning with urination or any blood in his urine. He does not have any previous history of abdominal pain problems. Patient states the pain is somewhat better now. He also felt a lump in his lower abdomen earlier. No past medical history on file.  No past surgical history on file.  No family history on file.  History  Substance Use Topics  . Smoking status: Not on file  . Smokeless tobacco: Not on file  . Alcohol Use: Not on file      Review of Systems  All other systems reviewed and are negative.    Allergies  Peach seed and Sudafed  Home Medications   Current Outpatient Rx  Name Route Sig Dispense Refill  . ACETAMINOPHEN 500 MG PO TABS Oral Take 500 mg by mouth once as needed. For pain    . ALBUTEROL SULFATE HFA 108 (90 BASE) MCG/ACT IN AERS Inhalation Inhale 2 puffs into the lungs every 6 (six) hours as needed. For wheezing    . BENZTROPINE MESYLATE 1 MG PO TABS Oral Take 1 mg by mouth daily.    Marland Kitchen HALOPERIDOL 10 MG PO TABS Oral Take 10 mg by mouth 2 (two) times daily.    Marland Kitchen LITHIUM CARBONATE 300 MG PO CAPS Oral Take 600 mg by mouth 2 (two) times daily with a meal.      BP 135/87  Pulse 89   Temp(Src) 99 F (37.2 C) (Oral)  Resp 18  SpO2 99%  Physical Exam  Nursing note and vitals reviewed. Constitutional: He appears well-developed and well-nourished. No distress.  HENT:  Head: Normocephalic and atraumatic.  Right Ear: External ear normal.  Left Ear: External ear normal.  Eyes: Conjunctivae are normal. Right eye exhibits no discharge. Left eye exhibits no discharge. No scleral icterus.  Neck: Neck supple. No tracheal deviation present.  Cardiovascular: Normal rate, regular rhythm and intact distal pulses.   Pulmonary/Chest: Effort normal and breath sounds normal. No stridor. No respiratory distress. He has no wheezes. He has no rales.  Abdominal: Soft. Bowel sounds are normal. He exhibits no distension. There is tenderness in the left upper quadrant and left lower quadrant. There is no rigidity, no rebound, no guarding, no CVA tenderness, no tenderness at McBurney's point and negative Murphy's sign. No hernia. Hernia confirmed negative in the left inguinal area.  Musculoskeletal: He exhibits no edema and no tenderness.  Neurological: He is alert. He has normal strength. No sensory deficit. Cranial nerve deficit:  no gross defecits noted. He exhibits normal muscle tone. He displays no seizure activity. Coordination normal.  Skin: Skin is warm and dry. No rash noted.  Psychiatric: He has a normal mood and affect.  ED Course  Procedures (including critical care time)   Labs Reviewed  CBC  DIFFERENTIAL  COMPREHENSIVE METABOLIC PANEL  LIPASE, BLOOD  URINALYSIS, ROUTINE W REFLEX MICROSCOPIC   Dg Abd Acute W/chest  03/09/2011  *RADIOLOGY REPORT*  Clinical Data: Lower abdominal pain, vomiting  ACUTE ABDOMEN SERIES (ABDOMEN 2 VIEW & CHEST 1 VIEW)  Comparison: CT abdomen pelvis dated 04/03/2009  Findings: Lungs are clear. No pleural effusion or pneumothorax.  Cardiomediastinal silhouette is within normal limits.  Nonobstructive bowel gas pattern.  Moderate stool in the colon.   No evidence of free air under the diaphragm on the upright view.  Visualized osseous structures are within normal limits.  IMPRESSION: No evidence of acute cardiopulmonary disease.  No evidence of small bowel obstruction or free air.  Moderate stool in the colon.  Original Report Authenticated By: Charline Bills, M.D.     1. Abdominal pain   2. Constipation       MDM  Patient's exam is benign this time. His laboratory and x-ray evaluation and were normal with the exception of constipation. On exam I do not appreciate any evidence of an incarcerated hernia or any hernia whatsoever. At this point I feel it is reasonable to discharge the patient home with precautions and plans for reevaluation. They've given a prescription for a mild laxative.        Celene Kras, MD 03/09/11 [phone]

## 2011-03-09 NOTE — ED Notes (Signed)
Patient resting with NAD at this time.  

## 2011-04-30 ENCOUNTER — Emergency Department (HOSPITAL_COMMUNITY): Payer: Medicaid Other

## 2011-04-30 ENCOUNTER — Emergency Department (HOSPITAL_COMMUNITY)
Admission: EM | Admit: 2011-04-30 | Discharge: 2011-04-30 | Disposition: A | Discharge status: Other Acute Inpatient Hospital | Payer: Medicaid Other | Attending: Emergency Medicine | Admitting: Emergency Medicine

## 2011-04-30 DIAGNOSIS — X58XXXA Exposure to other specified factors, initial encounter: Secondary | ICD-10-CM | POA: Insufficient documentation

## 2011-04-30 DIAGNOSIS — S60219A Contusion of unspecified wrist, initial encounter: Secondary | ICD-10-CM | POA: Insufficient documentation

## 2011-04-30 DIAGNOSIS — S60229A Contusion of unspecified hand, initial encounter: Secondary | ICD-10-CM

## 2011-04-30 DIAGNOSIS — F209 Schizophrenia, unspecified: Secondary | ICD-10-CM | POA: Insufficient documentation

## 2011-04-30 DIAGNOSIS — IMO0002 Reserved for concepts with insufficient information to code with codable children: Secondary | ICD-10-CM | POA: Insufficient documentation

## 2011-04-30 LAB — DIFFERENTIAL
Basophils Absolute: 0 10*3/uL (ref 0.0–0.1)
Basophils Relative: 0 % (ref 0–1)
Eosinophils Absolute: 0.1 10*3/uL (ref 0.0–0.7)
Eosinophils Relative: 1 % (ref 0–5)
Lymphocytes Relative: 23 % (ref 12–46)
Lymphs Abs: 2.3 10*3/uL (ref 0.7–4.0)
Monocytes Absolute: 0.6 10*3/uL (ref 0.1–1.0)
Monocytes Relative: 6 % (ref 3–12)
Neutro Abs: 7 10*3/uL (ref 1.7–7.7)
Neutrophils Relative %: 70 % (ref 43–77)

## 2011-04-30 LAB — RAPID URINE DRUG SCREEN, HOSP PERFORMED
Amphetamines: NOT DETECTED
Barbiturates: NOT DETECTED
Benzodiazepines: NOT DETECTED
Cocaine: NOT DETECTED
Opiates: NOT DETECTED
Tetrahydrocannabinol: NOT DETECTED

## 2011-04-30 LAB — CBC
HCT: 44.3 % (ref 39.0–52.0)
Hemoglobin: 15.4 g/dL (ref 13.0–17.0)
MCH: 31.7 pg (ref 26.0–34.0)
MCHC: 34.8 g/dL (ref 30.0–36.0)
MCV: 91.2 fL (ref 78.0–100.0)
Platelets: 183 10*3/uL (ref 150–400)
RBC: 4.86 MIL/uL (ref 4.22–5.81)
RDW: 12.3 % (ref 11.5–15.5)
WBC: 10 10*3/uL (ref 4.0–10.5)

## 2011-04-30 LAB — COMPREHENSIVE METABOLIC PANEL
ALT: 12 U/L (ref 0–53)
AST: 17 U/L (ref 0–37)
Albumin: 3.8 g/dL (ref 3.5–5.2)
Alkaline Phosphatase: 88 U/L (ref 39–117)
BUN: 15 mg/dL (ref 6–23)
CO2: 29 mEq/L (ref 19–32)
Calcium: 10 mg/dL (ref 8.4–10.5)
Chloride: 106 mEq/L (ref 96–112)
Creatinine, Ser: 1.18 mg/dL (ref 0.50–1.35)
GFR calc Af Amer: >90 mL/min (ref >90)
GFR calc non Af Amer: 79 mL/min — ABNORMAL LOW (ref >90)
Glucose, Bld: 112 mg/dL — ABNORMAL HIGH (ref 70–99)
Potassium: 4 mEq/L (ref 3.5–5.1)
Sodium: 141 mEq/L (ref 135–145)
Total Bilirubin: 0.4 mg/dL (ref 0.3–1.2)
Total Protein: 7.2 g/dL (ref 6.0–8.3)

## 2011-04-30 LAB — ETHANOL: Alcohol, Ethyl (B): <11 mg/dL (ref 0–11)

## 2011-04-30 NOTE — ED Notes (Signed)
Pt transported via police

## 2011-04-30 NOTE — BH Assessment (Signed)
Pt asleep as this time. [facility] rep is sitting w/ patient. Writer will attempt to assess when pt awake. Also, telepsych eval is pending.
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
## 2011-04-30 NOTE — Discharge Instructions (Signed)
Contusion A contusion is a deep bruise. Contusions are the result of an injury that caused bleeding under the skin. The contusion may turn blue, purple, or yellow. Minor injuries will give you a painless contusion, but more severe contusions may stay painful and swollen for a few weeks.  CAUSES  A contusion is usually caused by a blow, trauma, or direct force to an area of the body. SYMPTOMS   Swelling and redness of the injured area.   Bruising of the injured area.   Tenderness and soreness of the injured area.   Pain.  DIAGNOSIS  The diagnosis can be made by taking a history and physical exam. An X-ray, CT scan, or MRI may be needed to determine if there were any associated injuries, such as fractures. TREATMENT  Specific treatment will depend on what area of the body was injured. In general, the best treatment for a contusion is resting, icing, elevating, and applying cold compresses to the injured area. Over-the-counter medicines may also be recommended for pain control. Ask your caregiver what the best treatment is for your contusion. HOME CARE INSTRUCTIONS   Put ice on the injured area.   Put ice in a plastic bag.   Place a towel between your skin and the bag.   Leave the ice on for 15 to 20 minutes, 3 to 4 times a day.   Only take over-the-counter or prescription medicines for pain, discomfort, or fever as directed by your caregiver. Your caregiver may recommend avoiding anti-inflammatory medicines (aspirin, ibuprofen, and naproxen) for 48 hours because these medicines may increase bruising.   Rest the injured area.   If possible, elevate the injured area to reduce swelling.  SEEK IMMEDIATE MEDICAL CARE IF:   You have increased bruising or swelling.   You have pain that is getting worse.   Your swelling or pain is not relieved with medicines.  MAKE SURE YOU:   Understand these instructions.   Will watch your condition.   Will get help right away if you are not  doing well or get worse.  Document Released: 11/16/2004 Document Revised: 01/26/2011 Document Reviewed: 12/12/2010 [facility] Patient Information 2012 [place], [place].      Schizophrenia Schizophrenia is a serious mental illness. There is disturbed and disorganized thinking, language, and behavior. Patients may see, hear, or feel things that are not really there. Sometimes speech is incoherent and there are multiple problems with day to day living. Schizophrenia should not be confused with multiple personality disorder (now called dissociative identity disorder) in which a person has at least two distinct personalities. About 1% of people have schizophrenia in their lifetimes. It affects men and women equally. CAUSES  There are many theories about the cause of schizophrenia. The genes a person inherits from his or her parents may be partially responsible. Stress in a person's environment can trigger episodes. A person may have functioned normally for years and then have an acute (sudden) psychotic episode caused by stress. Some scientists believe that something might happen before birth, such as a viral infection in the womb, that causes schizophrenic symptoms (problems) decades later. Special scans, such as PET (positron-emission tomography) have been used to look at the brains of people with this illness. These pictures show that some parts of the brain seem to have metabolic or chemical abnormalities. Lab studies have shown that nerve cells in some parts of the brains of schizophrenics may be uneven or damaged. Another possible cause is that chemicals carrying signals between nerve  cells may not be working. Schizophrenia does not appear to be caused by family problems. Stress does appear to make things worse for people with this illness. SYMPTOMS No single symptom defines this condition. Important signs are:  Hallucinations (hearing voices or seeing things that are not there to hear or see).    Dressing inappropriately.   Neglecting personal hygiene and grooming.   Withdrawing from social contacts and not speaking to anybody (autism).   Inability to understand what a schizophrenic is saying.   A growing distrust of people without good cause.   Being very bland or blunted emotionally (flat affect).  TYPES OF SCHIZOPHRENIA  Paranoid schizophrenia involves delusional thoughts. The patient believes people around them are against them and plotting against them.   In grandiose schizophrenia the patient may feel that he is God, or the President, etc.   Disorganized schizophrenia involves symptoms of disorganized speech.  TREATMENT  Medications are the most important part of the treatment of schizophrenia. Many medications are available that can relieve symptoms. It is often helpful if these can be administered by a more trusted family member because the patient may sometimes think they are being poisoned. It is important that the medications be given regularly even when the patient seems to be doing well. Do not stop giving medications without instruction by a caregiver. This could lead to a relapse. Hospitalization may sometimes be necessary if symptoms cannot be controlled with medications. Schizophrenia may be lifelong. However, periods of illness may be inter spaced with long periods of normality. Medications can greatly improve the quality of life. Non prescribed drugs and alcohol should be avoided.  Assistance is available for care. The [employer] is an organization of family members of people with severe mental illness. They direct families and patients to support groups, education, and advocacy programs for additional help. [employer]'s [employer] for [facility] [employer]ally Ill) toll-free help line number is 800/950-NAMI, or 970-466-5950. Document Released: 02/04/2000 Document Revised: 01/26/2011 Document Reviewed: 02/06/2005 [facility] Patient Information  2012 [place], [place].

## 2011-04-30 NOTE — ED Notes (Signed)
[id number]> Expected date:<BR> Expected time:<BR> Means of arrival:<BR> Comments:<BR> Pt in tx

## 2011-04-30 NOTE — BH Assessment (Signed)
Writer spoke with Journalist, newspaper at [facility]. Per Carly, if pt is medically cleared and not in a hard cast, [facility] will take him back. Carly says that she will have to show ED transfer notes which writer will fax to Dr. Guss Bunde at [facility] for final say as to whether he can return there. Writer notified Photographer and EDP. Writer will keep them both apprised of situation.

## 2011-04-30 NOTE — BH Assessment (Signed)
Carly RN at [facility] says that pt can come back to [place] since he has been medically cleared. Writer notified EDP.

## 2011-04-30 NOTE — ED Notes (Signed)
MD at bedside on video to eval.

## 2011-05-01 ENCOUNTER — Emergency Department (HOSPITAL_COMMUNITY)
Admission: EM | Admit: 2011-05-01 | Discharge: 2011-05-01 | Disposition: A | Discharge status: Other Acute Inpatient Hospital | Payer: Medicaid Other | Attending: Emergency Medicine | Admitting: Emergency Medicine

## 2011-05-01 ENCOUNTER — Encounter (HOSPITAL_COMMUNITY): Payer: Self-pay | Admitting: *Deleted

## 2011-05-01 DIAGNOSIS — F209 Schizophrenia, unspecified: Secondary | ICD-10-CM | POA: Insufficient documentation

## 2011-05-01 DIAGNOSIS — F29 Unspecified psychosis not due to a substance or known physiological condition: Secondary | ICD-10-CM | POA: Insufficient documentation

## 2011-05-01 NOTE — Discharge Instructions (Signed)
Schizophrenia  Schizophrenia is a serious mental illness. There is disturbed and disorganized thinking, language, and behavior. Patients may see, hear, or feel things that are not really there. Sometimes speech is incoherent and there are multiple problems with day to day living. Schizophrenia should not be confused with multiple personality disorder (now called dissociative identity disorder) in which a person has at least two distinct personalities. About 1% of people have schizophrenia in their lifetimes. It affects men and women equally.  CAUSES   There are many theories about the cause of schizophrenia. The genes a person inherits from his or her parents may be partially responsible. Stress in a person's environment can trigger episodes. A person may have functioned normally for years and then have an acute (sudden) psychotic episode caused by stress. Some scientists believe that something might happen before birth, such as a viral infection in the womb, that causes schizophrenic symptoms (problems) decades later. Special scans, such as PET (positron-emission tomography) have been used to look at the brains of people with this illness. These pictures show that some parts of the brain seem to have metabolic or chemical abnormalities. Lab studies have shown that nerve cells in some parts of the brains of schizophrenics may be uneven or damaged. Another possible cause is that chemicals carrying signals between nerve cells may not be working. Schizophrenia does not appear to be caused by family problems. Stress does appear to make things worse for people with this illness.  SYMPTOMS  No single symptom defines this condition. Important signs are:   Hallucinations (hearing voices or seeing things that are not there to hear or see).   Dressing inappropriately.   Neglecting personal hygiene and grooming.   Withdrawing from social contacts and not speaking to anybody (autism).   Inability to understand what a  schizophrenic is saying.   A growing distrust of people without good cause.   Being very bland or blunted emotionally (flat affect).  TYPES OF SCHIZOPHRENIA   Paranoid schizophrenia involves delusional thoughts. The patient believes people around them are against them and plotting against them.   In grandiose schizophrenia the patient may feel that he is God, or the President, etc.   Disorganized schizophrenia involves symptoms of disorganized speech.  TREATMENT   Medications are the most important part of the treatment of schizophrenia. Many medications are available that can relieve symptoms. It is often helpful if these can be administered by a more trusted family member because the patient may sometimes think they are being poisoned. It is important that the medications be given regularly even when the patient seems to be doing well. Do not stop giving medications without instruction by a caregiver. This could lead to a relapse. Hospitalization may sometimes be necessary if symptoms cannot be controlled with medications.  Schizophrenia may be lifelong. However, periods of illness may be inter spaced with long periods of normality. Medications can greatly improve the quality of life. Non prescribed drugs and alcohol should be avoided.   Assistance is available for care. The [employer]ally [employer] is an organization of family members of people with severe mental illness. They direct families and patients to support groups, education, and advocacy programs for additional help.  NAMI's ([employer]) toll-free help line number is 800/950-NAMI, or 800/950-6264.  Document Released: 02/04/2000 Document Revised: 01/26/2011 Document Reviewed: 02/06/2005  [employer] Patient Information 2012 [employer].

## 2011-05-01 NOTE — ED Provider Notes (Addendum)
History     CSN: [id number]  Arrival date & time 05/01/11  1654   First MD Initiated Contact with Patient 05/01/11 1757      Chief Complaint  Patient presents with  . Medical Clearance    (Consider location/radiation/quality/duration/timing/severity/associated sxs/prior treatment) The history is provided by the patient and the EMS personnel.   patient was sent from [facility]. He was seen in ER yesterday. He is a history of schizophrenia. He is reportedly accepted at [facility]. While at [place] he reportedly ate some of the padding off-the-wall. He was sent to her for medical clearance. Patient is without complaints.  History reviewed. No pertinent past medical history.  History reviewed. No pertinent past surgical history.  History reviewed. No pertinent family history.  History  Substance Use Topics  . Smoking status: Not on file  . Smokeless tobacco: Not on file  . Alcohol Use: Not on file      Review of Systems  Unable to perform ROS: Psychiatric disorder    Allergies  Peach seed and Sudafed  Home Medications   Current Outpatient Rx  Name Route Sig Dispense Refill  . ALBUTEROL SULFATE HFA 108 (90 BASE) MCG/ACT IN AERS Inhalation Inhale 2 puffs into the lungs every 6 (six) hours as needed. For wheezing    . BENZTROPINE MESYLATE 1 MG PO TABS Oral Take 1 mg by mouth at bedtime.     Marland Kitchen HALOPERIDOL 10 MG PO TABS Oral Take 10 mg by mouth 2 (two) times daily.    Marland Kitchen LITHIUM CARBONATE 300 MG PO CAPS Oral Take 600 mg by mouth 2 (two) times daily with a meal.    . ACETAMINOPHEN 500 MG PO TABS Oral Take 500 mg by mouth once as needed. For pain      BP 133/90  Pulse 84  Temp(Src) 99.3 F (37.4 C) (Oral)  Resp 13  SpO2 100%  Physical Exam  Nursing note and vitals reviewed. Constitutional: He is oriented to person, place, and time. He appears well-developed and well-nourished.  HENT:  Head: Normocephalic and atraumatic.  Eyes: EOM are normal. Pupils are equal,  round, and reactive to light.  Neck: Normal range of motion. Neck supple.  Cardiovascular: Normal rate, regular rhythm and normal heart sounds.   No murmur heard. Pulmonary/Chest: Effort normal and breath sounds normal.  Abdominal: Soft. Bowel sounds are normal. He exhibits no distension and no mass. There is no tenderness. There is no rebound and no guarding.  Musculoskeletal: Normal range of motion. He exhibits no edema.  Neurological: He is alert and oriented to person, place, and time. No cranial nerve deficit.  Skin: Skin is warm and dry.  Psychiatric:       Patient with mild confusion    ED Course  Procedures (including critical care time)  Labs Reviewed - No data to display Dg Hand Complete Right  04/30/2011  *RADIOLOGY REPORT*  Clinical Data: Medical clearance.  Injured right wrist.  RIGHT HAND - COMPLETE 3+ VIEW  Comparison: None  Findings: The joint spaces are maintained.  No acute fracture.  IMPRESSION: No acute bony findings.  Original Report Authenticated By: P. Loralie Champagne, M.D.     1. Schizophrenia       MDM  Patient sent in for medical clearance after eating some padding off-the-wall. No abdominal pain. I doubt this is severe ingestion. He'll be discharged to [facility] as planned        [place] R. Rubin Payor, MD 05/01/11 1912  Patient appears  to medically cleared. He appears to be appropriate for inpatient psychiatric treatment.  Juliet Rude. Rubin Payor, MD 05/01/11 [phone]

## 2011-05-01 NOTE — ED Notes (Signed)
To ed for eval after eating paint off of a wall at [employer]. Pt was in a padded room.

## 2011-05-01 NOTE — ED Notes (Signed)
Pt. Is very calm and cooperative, Pt. Ate cheese and peanut butter crackers

## 2011-07-14 ENCOUNTER — Encounter (HOSPITAL_COMMUNITY): Payer: Self-pay

## 2011-07-14 ENCOUNTER — Emergency Department (HOSPITAL_COMMUNITY)
Admission: EM | Admit: 2011-07-14 | Discharge: 2011-07-14 | Disposition: A | Discharge status: Home / Self Care | Payer: Medicaid Other | Attending: Emergency Medicine | Admitting: Emergency Medicine

## 2011-07-14 ENCOUNTER — Emergency Department (HOSPITAL_COMMUNITY): Payer: Medicaid Other

## 2011-07-14 DIAGNOSIS — R079 Chest pain, unspecified: Secondary | ICD-10-CM | POA: Insufficient documentation

## 2011-07-14 DIAGNOSIS — M7989 Other specified soft tissue disorders: Secondary | ICD-10-CM

## 2011-07-14 DIAGNOSIS — R05 Cough: Secondary | ICD-10-CM | POA: Insufficient documentation

## 2011-07-14 DIAGNOSIS — J45901 Unspecified asthma with (acute) exacerbation: Secondary | ICD-10-CM | POA: Insufficient documentation

## 2011-07-14 DIAGNOSIS — R10817 Generalized abdominal tenderness: Secondary | ICD-10-CM | POA: Insufficient documentation

## 2011-07-14 DIAGNOSIS — F22 Delusional disorders: Secondary | ICD-10-CM | POA: Insufficient documentation

## 2011-07-14 DIAGNOSIS — Z5900 Homelessness unspecified: Secondary | ICD-10-CM | POA: Insufficient documentation

## 2011-07-14 DIAGNOSIS — R0602 Shortness of breath: Secondary | ICD-10-CM | POA: Insufficient documentation

## 2011-07-14 DIAGNOSIS — R093 Abnormal sputum: Secondary | ICD-10-CM | POA: Insufficient documentation

## 2011-07-14 DIAGNOSIS — M79609 Pain in unspecified limb: Secondary | ICD-10-CM

## 2011-07-14 DIAGNOSIS — R059 Cough, unspecified: Secondary | ICD-10-CM | POA: Insufficient documentation

## 2011-07-14 DIAGNOSIS — F411 Generalized anxiety disorder: Secondary | ICD-10-CM | POA: Insufficient documentation

## 2011-07-14 HISTORY — DX: Homelessness: Z59.0

## 2011-07-14 HISTORY — DX: Homelessness unspecified: Z59.00

## 2011-07-14 HISTORY — DX: Schizophrenia, unspecified: F20.9

## 2011-07-14 LAB — COMPREHENSIVE METABOLIC PANEL
ALT: 17 U/L (ref 0–53)
AST: 29 U/L (ref 0–37)
Albumin: 3.9 g/dL (ref 3.5–5.2)
Alkaline Phosphatase: 92 U/L (ref 39–117)
BUN: 12 mg/dL (ref 6–23)
CO2: 26 mEq/L (ref 19–32)
Calcium: 9.3 mg/dL (ref 8.4–10.5)
Chloride: 106 mEq/L (ref 96–112)
Creatinine, Ser: 1.05 mg/dL (ref 0.50–1.35)
GFR calc Af Amer: >90 mL/min (ref >90)
GFR calc non Af Amer: >90 mL/min (ref >90)
Glucose, Bld: 112 mg/dL — ABNORMAL HIGH (ref 70–99)
Potassium: 3.9 mEq/L (ref 3.5–5.1)
Sodium: 139 mEq/L (ref 135–145)
Total Bilirubin: 0.4 mg/dL (ref 0.3–1.2)
Total Protein: 7.1 g/dL (ref 6.0–8.3)

## 2011-07-14 LAB — CBC
HCT: 43.6 % (ref 39.0–52.0)
Hemoglobin: 14.6 g/dL (ref 13.0–17.0)
MCH: 31.1 pg (ref 26.0–34.0)
MCHC: 33.5 g/dL (ref 30.0–36.0)
MCV: 92.8 fL (ref 78.0–100.0)
Platelets: 224 10*3/uL (ref 150–400)
RBC: 4.7 MIL/uL (ref 4.22–5.81)
RDW: 12.2 % (ref 11.5–15.5)
WBC: 7.9 10*3/uL (ref 4.0–10.5)

## 2011-07-14 LAB — DIFFERENTIAL
Basophils Absolute: 0 10*3/uL (ref 0.0–0.1)
Basophils Relative: 0 % (ref 0–1)
Eosinophils Absolute: 0.1 10*3/uL (ref 0.0–0.7)
Eosinophils Relative: 1 % (ref 0–5)
Lymphocytes Relative: 22 % (ref 12–46)
Lymphs Abs: 1.7 10*3/uL (ref 0.7–4.0)
Monocytes Absolute: 0.6 10*3/uL (ref 0.1–1.0)
Monocytes Relative: 7 % (ref 3–12)
Neutro Abs: 5.5 10*3/uL (ref 1.7–7.7)
Neutrophils Relative %: 70 % (ref 43–77)

## 2011-07-14 LAB — PROTIME-INR
INR: 1.04 (ref 0.00–1.49)
Prothrombin Time: 13.8 s (ref 11.6–15.2)

## 2011-07-14 MED ORDER — ALBUTEROL SULFATE HFA 108 (90 BASE) MCG/ACT IN AERS: 2.0000 | INHALATION_SPRAY | RESPIRATORY_TRACT | Status: DC | PRN

## 2011-07-14 MED ORDER — IOHEXOL 300 MG/ML  SOLN
100.0000 mL | Freq: Once | INTRAMUSCULAR | Status: AC | PRN
  Administered 2011-07-14: 100 mL via INTRAVENOUS

## 2011-07-14 NOTE — ED Provider Notes (Signed)
History     CSN: [id number]  Arrival date & time 07/14/11  1315   First MD Initiated Contact with Patient 07/14/11 1321      Chief Complaint  Patient presents with  . Anxiety    chest pain    (Consider location/radiation/quality/duration/timing/severity/associated sxs/prior treatment) HPI Comments: Patient reports he has had a note in his left chest x 3 days, has been constant.  States associated SOB and cough.  Reports hemoptysis that began 6 days ago.  Sputum now is "mucus mixed with blood."  Also notes right leg pain and swelling.  States he is not able to eat.  Was thrown out by his girlfriend and has been off of his medications for two weeks.  Has been sleeping in a tent.  Per EMS, pt was denied a field trip today and became upset, anxious, and had a panic attack.    The history is provided by the patient.    Past Medical History  Diagnosis Date  . Asthma   . Homeless   . Schizophrenia     History reviewed. No pertinent past surgical history.  No family history on file.  History  Substance Use Topics  . Smoking status: Not on file  . Smokeless tobacco: Not on file  . Alcohol Use: Yes      Review of Systems  Unable to perform ROS: Psychiatric disorder    Allergies  Peach seed and Sudafed  Home Medications   Current Outpatient Rx  Name Route Sig Dispense Refill  . ALBUTEROL SULFATE HFA 108 (90 BASE) MCG/ACT IN AERS Inhalation Inhale 2 puffs into the lungs every 6 (six) hours as needed. For wheezing      BP 138/83  Pulse 79  Temp(Src) 97.9 F (36.6 C) (Oral)  Resp 20  SpO2 97%  Physical Exam  Nursing note and vitals reviewed. Constitutional: He is oriented to person, place, and time. He appears well-developed and well-nourished.  HENT:  Head: Normocephalic and atraumatic.  Neck: Neck supple.  Cardiovascular: Normal rate, regular rhythm and normal heart sounds.   Pulmonary/Chest: Effort normal and breath sounds normal. No respiratory distress. He  has no wheezes. He has no rales. He exhibits no tenderness.  Abdominal: Soft. Bowel sounds are normal. He exhibits no distension and no mass. There is generalized tenderness. There is no rebound and no guarding.  Neurological: He is alert and oriented to person, place, and time.  Psychiatric: His mood appears anxious. Thought content is paranoid.    ED Course  Procedures (including critical care time)  Labs Reviewed  COMPREHENSIVE METABOLIC PANEL - Abnormal; Notable for the following:    Glucose, Bld 112 (*)    All other components within normal limits  CBC  DIFFERENTIAL  PROTIME-INR   Ct Angio Chest W/cm &/or Wo Cm  07/14/2011  *RADIOLOGY REPORT*  Clinical Data: Chest pain, shortness of breath and hemoptysis.  CT ANGIOGRAPHY CHEST  Technique:  Multidetector CT imaging of the chest using the standard protocol during bolus administration of intravenous contrast. Multiplanar reconstructed images including MIPs were obtained and reviewed to evaluate the vascular anatomy.  Contrast: OMNIPAQUE IOHEXOL 300 MG/ML  SOLN  Comparison: Chest radiograph of 07/22/2009  Findings: No evidence for a pulmonary embolism.  No significant pericardial or pleural fluid.  Tissue in the anterior mediastinum is most compatible with thymus.  There is no significant mediastinal or chest lymphadenopathy.  Thyroid tissue appears to be prominent bilaterally.  Images of the upper abdomen are unremarkable.  The trachea and mainstem bronchi are patent.  The lungs are clear without focal airspace disease and no interstitial thickening or disease.  No acute bony abnormalities.  IMPRESSION: Negative for pulmonary embolism.  No acute chest findings.  Thyroid tissue may be prominent and suggest correlating this finding with thyroid function tests.  Original Report Authenticated By: Richarda Overlie, M.D.    Pt discussed with Dr Roselyn Bering.   Venous doppler right leg is negative.     Date: 07/14/2011  Rate: 78  Rhythm: normal  sinus rhythm  QRS Axis: normal  Intervals: normal  ST/T Wave abnormalities: nonspecific ST changes  Conduction Disutrbances:none  Narrative Interpretation: Prior EKG with artifact, and t wave changes no longer evident  Old EKG Reviewed: changes noted (see above)    1. Cough   2. Chest pain       MDM  Patient with strong psychiatric history well known to the Emergency Department presented with left sided chest pain that has been constant for three days, associated cough with reported hemoptysis.  Pt is not coughing on exam, lungs CTAB.  Pt also notes pain in right leg and did have mild asymmetry of calves R>L, tenderness of right calf.  The pain is not exertional and EKG shows no ischemic changes.  Discussed all results with patient.  Pt d/c home with PCP follow up.          Dillard Cannon [place], [place] 07/14/11 [phone]

## 2011-07-14 NOTE — ED Notes (Signed)
Per EMS pt is homeless and is in a action program and was denied for the field trip today and pt became upset anxious and had a panic attack hyperventilating. Pt calm now c/o chest wall pain. [employer] at bedside

## 2011-07-14 NOTE — ED Notes (Signed)
[id number]> Expected date:<BR> Expected time: 1:08 PM<BR> Means of arrival:<BR> Comments:<BR> M120 - 35yoM Chest wall pain *with PD*

## 2011-07-14 NOTE — Discharge Instructions (Signed)
Read the information below.  Please use the inhaler as directed.  If you have worsening chest pain, shortness of breath, or fevers, return to the ER for a recheck.  Please use the resources below to find a primary care provider for follow up.  You may return to the ER at any time for worsening condition or any new symptoms that concern you.   Chest Pain (Nonspecific) Chest pain has many causes. Your pain could be caused by something serious, such as a heart attack or a blood clot in the lungs. It could also be caused by something less serious, such as a chest bruise or a virus. Follow up with your doctor. More lab tests or other studies may be needed to find the cause of your pain. Most of the time, nonspecific chest pain will improve within 2 to 3 days of rest and mild pain medicine. HOME CARE  For chest bruises, you may put ice on the sore area for 15 to 20 minutes, 3 to 4 times a day. Do this only if it makes you or your child feel better.   Put ice in a plastic bag.   Place a towel between the skin and the bag.   Rest for the next 2 to 3 days.   Go back to work if the pain improves.   See your doctor if the pain lasts longer than 1 to 2 weeks.   Only take medicine as told by your doctor.   Quit smoking if you smoke.  GET HELP RIGHT AWAY IF:   There is more pain or pain that spreads to the arm, neck, jaw, back, or belly (abdomen).   You or your child has shortness of breath.   You or your child coughs more than usual or coughs up blood.   You or your child has very bad back or belly pain, feels sick to his or her stomach (nauseous), or throws up (vomits).   You or your child has very bad weakness.   You or your child passes out (faints).   You or your child has a temperature by mouth above 102 F (38.9 C), not controlled by medicine.  Any of these problems may be serious and may be an emergency. Do not wait to see if the problems will go away. Get medical help right away.  Call your local emergency services 911 in [place] Do not drive yourself to the hospital. MAKE SURE YOU:   Understand these instructions.   Will watch this condition.   Will get help right away if you or your child is not doing well or gets worse.  Document Released: 07/26/2007 Document Revised: 01/26/2011 Document Reviewed: 07/26/2007 [facility] Patient Information 2012 [place], [place]  .Cough, Adult  A cough is a reflex. It helps you clear your throat and airways. A cough can help heal your body. A cough can last 2 or 3 weeks (acute) or may last more than 8 weeks (chronic). Some common causes of a cough can include an infection, allergy, or a cold. HOME CARE  Only take medicine as told by your doctor.   If given, take your medicines (antibiotics) as told. Finish them even if you start to feel better.   Use a cold steam vaporizer or humidier in your home. This can help loosen thick spit (secretions).   Sleep so you are almost sitting up (semi-upright). Use pillows to do this. This helps reduce coughing.   Rest as needed.   Stop smoking  if you smoke.  GET HELP RIGHT AWAY IF:  You have yellowish-white fluid (pus) in your thick spit.   Your cough gets worse.   Your medicine does not reduce coughing, and you are losing sleep.   You cough up blood.   You have trouble breathing.   Your pain gets worse and medicine does not help.   You have a fever.  MAKE SURE YOU:   Understand these instructions.   Will watch your condition.   Will get help right away if you are not doing well or get worse.  Document Released: 10/20/2010 Document Revised: 01/26/2011 Document Reviewed: 10/20/2010 [facility] Patient Information 2012 [place], [place].  If you have no primary doctor, here are some resources that may be helpful:  Medicaid-accepting [facility] Dt Providers:   - [facility]- [address]      [phone]      Mon-Fri 9am-7pm, Sat 9am-1pm   -  [facility]- [address] [place]      [phone]   - [facility]- [address] [place]      [phone]   [facility]- [address]      [phone]   - Renaye Rakers- [address] [place], [address]      [phone]      Only accepts [place] patients       after they have her name applied to their card   Self Pay (no insurance) in [place]:   - Sickle Cell Patients: Dr Willey Blade, [facility]      [address] [place]      [phone]   - [phone]   - Physician Referral Service- [phone]   - [facility]- [address] [place]      [phone]   [facility] [place]- [address]   - [facility]- see information above      (Speak to [employer] if you do not have insurance)   - [facility]- [address] [place]      [phone]   - [facility] [place]- [address]      [phone]   - [facility]- [address]      [phone]   - Dr Julio Sicks-  [address], [address], [place]      [phone]   - [facility]- [address]      [phone]   - [facility]- [address]      [phone]      Also [address]      [phone]   - [facility]- [address]      [phone]      1st and 3rd Saturday every month, 10am-1pm Other agencies that provide inexpensive medical care:    [facility]  [phone]    [facility]  [phone]    [facility]  [phone]    [facility]  [phone]    [facility]  [phone]  General Information: Finding a doctor when you do not have health insurance can be tricky. Although you are not limited by an insurance plan, you are of course limited by her finances and how much but he can pay out of pocket.  What are your options if you don't have health  insurance?   1) Find a Librarian, academic and Pay Out of Pocket Although you won't have to find out who is covered by your insurance plan, it is a good idea to ask around and get recommendations. You will then need to call the office and see if the doctor you have chosen will accept you as a new patient and what types of options they offer for patients who are self-pay. Some doctors offer discounts or will set up payment plans for their patients who do not have insurance, but you will need to ask so you aren't surprised when you get to your appointment.  2) Contact Your Local Health Department Not all health departments have doctors that can see patients for sick visits, but many do, so it is worth a call to see if yours does. If you don't know where your local health department is, you can check in your phone book. The CDC also has a tool to help you locate your state's health department, and many state websites also have listings of all of their local health departments.  3) Find a Walk-in Clinic If your illness is not likely to be very severe or complicated, you may want to try a walk in clinic. These are popping up all over the country in pharmacies, drugstores, and shopping centers. They're usually staffed by nurse practitioners or physician assistants that have been trained to treat common illnesses and complaints. They're usually fairly quick and inexpensive. However, if you have serious medical issues or chronic medical problems, these are probably not your best option

## 2011-07-14 NOTE — Progress Notes (Signed)
VASCULAR LAB PRELIMINARY  PRELIMINARY  PRELIMINARY  PRELIMINARY  Right lower extremity venous duplex completed.    Preliminary report: Right:  No evidence of DVT, superficial thrombosis, or Baker's cyst.  Carles Florea D, RVS 07/14/2011, 2:53 PM

## 2011-07-15 NOTE — ED Provider Notes (Signed)
Medical screening examination/treatment/procedure(s) were performed by non-physician practitioner and as supervising physician I was immediately available for consultation/collaboration.   Ellasyn Swilling R Pearley Baranek, MD 07/15/11 0700 

## 2011-09-14 ENCOUNTER — Encounter (HOSPITAL_COMMUNITY): Payer: Self-pay | Admitting: *Deleted

## 2011-09-14 ENCOUNTER — Emergency Department (HOSPITAL_COMMUNITY)
Admission: EM | Admit: 2011-09-14 | Discharge: 2011-09-14 | Disposition: A | Discharge status: Home / Self Care | Payer: Medicaid Other | Attending: Emergency Medicine | Admitting: Emergency Medicine

## 2011-09-14 DIAGNOSIS — M25519 Pain in unspecified shoulder: Secondary | ICD-10-CM | POA: Insufficient documentation

## 2011-09-14 DIAGNOSIS — Z79899 Other long term (current) drug therapy: Secondary | ICD-10-CM | POA: Insufficient documentation

## 2011-09-14 MED ORDER — ACETAMINOPHEN-CODEINE #3 300-30 MG PO TABS: 1.0000 | ORAL_TABLET | Freq: Four times a day (QID) | ORAL | Status: AC | PRN

## 2011-09-14 MED ORDER — IBUPROFEN 800 MG PO TABS
800.0000 mg | ORAL_TABLET | Freq: Once | ORAL | Status: AC
  Administered 2011-09-14: 800 mg via ORAL
  Filled 2011-09-14: qty 1

## 2011-09-14 MED ORDER — ACETAMINOPHEN-CODEINE #3 300-30 MG PO TABS
1.0000 | ORAL_TABLET | Freq: Once | ORAL | Status: AC
  Administered 2011-09-14: 1 via ORAL
  Filled 2011-09-14: qty 1

## 2011-09-14 MED ORDER — IBUPROFEN 800 MG PO TABS: 800.0000 mg | ORAL_TABLET | Freq: Three times a day (TID) | ORAL | Status: AC

## 2011-09-14 NOTE — ED Notes (Signed)
Per EMS - pt from home, admits to x7 of left shoulder/shoulder blade pain. No known mechanism of injury.

## 2011-09-14 NOTE — ED Provider Notes (Signed)
History     CSN: [id number]  Arrival date & time 09/14/11  5621   First MD Initiated Contact with Patient 09/14/11 2012      Chief Complaint  Patient presents with  . Shoulder Pain    (Consider location/radiation/quality/duration/timing/severity/associated sxs/prior treatment) Patient is a 36 y.o. male presenting with shoulder pain. The history is provided by the patient and the EMS personnel.  Shoulder Pain   Patient is brought to the emergency department by EMS with complaint of left shoulder pain x1 week. Patient denies known injury to shoulder stating he had gradual onset of pain with most pain particularly in the posterior aspect of the shoulder. Patient states that pain is aggravated by movement and mildly improved with rest. Patient states she's taken Tylenol at home without relief of symptoms. He denies extremity numbness/T9/weakness, skin changes, swelling, heat of the shoulder, or redness. He denies any chest pain or shortness of breath, abdominal pain, nausea, or vomiting. Past Medical History  Diagnosis Date  . Asthma   . Homeless   . Schizophrenia     History reviewed. No pertinent past surgical history.  History reviewed. No pertinent family history.  History  Substance Use Topics  . Smoking status: Not on file  . Smokeless tobacco: Not on file  . Alcohol Use: Yes      Review of Systems  All other systems reviewed and are negative.    Allergies  Peach seed and Sudafed  Home Medications   Current Outpatient Rx  Name Route Sig Dispense Refill  . ALBUTEROL SULFATE HFA 108 (90 BASE) MCG/ACT IN AERS Inhalation Inhale 2 puffs into the lungs every 6 (six) hours as needed. For wheezing    . CHLORPROMAZINE HCL 200 MG PO TABS Oral Take 200 mg by mouth at bedtime.    . CHLORPROMAZINE HCL 50 MG PO TABS Oral Take 50 mg by mouth 4 (four) times daily.    Marland Kitchen HALOPERIDOL DECANOATE 100 MG/ML IM SOLN Intramuscular Inject 150 mg into the muscle every 14 (fourteen)  days.    Marland Kitchen LITHIUM CARBONATE 300 MG PO CAPS Oral Take 600-900 mg by mouth 2 (two) times daily with a meal. Take 600mg  every morning and 900mg  every night.    . ACETAMINOPHEN-CODEINE #3 300-30 MG PO TABS Oral Take 1-2 tablets by mouth every 6 (six) hours as needed for pain. 15 tablet 0  . IBUPROFEN 800 MG PO TABS Oral Take 1 tablet (800 mg total) by mouth 3 (three) times daily. Take 800mg  by mouth at breakfast, lunch and dinner for the next 5 days 21 tablet 0    BP 142/98  Pulse 75  Temp 98.7 F (37.1 C)  Resp 20  SpO2 100%  Physical Exam  Nursing note and vitals reviewed. Constitutional: He is oriented to person, place, and time. He appears well-developed.  HENT:  Head: Normocephalic and atraumatic.  Eyes: Conjunctivae are normal.  Neck: Normal range of motion. Neck supple.  Cardiovascular: Normal rate and intact distal pulses.  Exam reveals no gallop and no friction rub.   No murmur heard. Pulmonary/Chest: Effort normal and breath sounds normal. No respiratory distress. He has no wheezes. He has no rales. He exhibits no tenderness.  Abdominal: Soft. He exhibits no distension. There is no tenderness. There is no rebound.  Musculoskeletal: Normal range of motion. He exhibits tenderness.       FROM of left shoulder with pain with FROM but no crepitous, skin changes or deformity. Muscle spasticity along left scapula.  5/5 strength. Normal reflexes and sensation of LUE.   Neurological: He is alert and oriented to person, place, and time. He has normal reflexes.  Skin: Skin is warm and dry. No rash noted. No erythema. No pallor.    ED Course  Procedures (including critical care time)  PO ibuprofen and tylenol #3.   Labs Reviewed - No data to display No results found.   1. Shoulder pain       MDM  Though there is no known injury to the left shoulder. Large degree of muscle spasticity around the left shoulder however full range of motion without crepitus. Left upper extremity is  neurovascularly intact. No deformity. Spoke at length with patient about musculoskeletal pain and conservative management. However he did give orthopedic followup for further evaluation of ongoing pain. He denies any chest pain, shortness of breath, abdominal pain, nausea, or vomiting.        [place], [place] 09/14/11 2044

## 2011-09-15 NOTE — ED Provider Notes (Signed)
Medical screening examination/treatment/procedure(s) were performed by non-physician practitioner and as supervising physician I was immediately available for consultation/collaboration.  Cyndra Numbers, MD 09/15/11 [phone]

## 2012-02-27 ENCOUNTER — Encounter (HOSPITAL_COMMUNITY): Payer: Self-pay | Admitting: Emergency Medicine

## 2012-02-27 ENCOUNTER — Emergency Department (HOSPITAL_COMMUNITY)
Admission: EM | Admit: 2012-02-27 | Discharge: 2012-02-27 | Disposition: A | Discharge status: Home / Self Care | Payer: Medicaid Other | Attending: Emergency Medicine | Admitting: Emergency Medicine

## 2012-02-27 DIAGNOSIS — Z5900 Homelessness unspecified: Secondary | ICD-10-CM | POA: Insufficient documentation

## 2012-02-27 DIAGNOSIS — J45909 Unspecified asthma, uncomplicated: Secondary | ICD-10-CM | POA: Insufficient documentation

## 2012-02-27 DIAGNOSIS — Z79899 Other long term (current) drug therapy: Secondary | ICD-10-CM | POA: Insufficient documentation

## 2012-02-27 DIAGNOSIS — F209 Schizophrenia, unspecified: Secondary | ICD-10-CM | POA: Insufficient documentation

## 2012-02-27 DIAGNOSIS — K055 Other periodontal diseases: Secondary | ICD-10-CM | POA: Insufficient documentation

## 2012-02-27 DIAGNOSIS — F411 Generalized anxiety disorder: Secondary | ICD-10-CM | POA: Insufficient documentation

## 2012-02-27 DIAGNOSIS — F29 Unspecified psychosis not due to a substance or known physiological condition: Secondary | ICD-10-CM | POA: Insufficient documentation

## 2012-02-27 DIAGNOSIS — I10 Essential (primary) hypertension: Secondary | ICD-10-CM | POA: Insufficient documentation

## 2012-02-27 DIAGNOSIS — K068 Other specified disorders of gingiva and edentulous alveolar ridge: Secondary | ICD-10-CM

## 2012-02-27 DIAGNOSIS — T887XXA Unspecified adverse effect of drug or medicament, initial encounter: Secondary | ICD-10-CM | POA: Insufficient documentation

## 2012-02-27 DIAGNOSIS — W010XXA Fall on same level from slipping, tripping and stumbling without subsequent striking against object, initial encounter: Secondary | ICD-10-CM | POA: Insufficient documentation

## 2012-02-27 DIAGNOSIS — Y9389 Activity, other specified: Secondary | ICD-10-CM | POA: Insufficient documentation

## 2012-02-27 DIAGNOSIS — Y929 Unspecified place or not applicable: Secondary | ICD-10-CM | POA: Insufficient documentation

## 2012-02-27 LAB — COMPREHENSIVE METABOLIC PANEL
ALT: 12 U/L (ref 0–53)
AST: 17 U/L (ref 0–37)
Albumin: 4.2 g/dL (ref 3.5–5.2)
Alkaline Phosphatase: 108 U/L (ref 39–117)
BUN: 13 mg/dL (ref 6–23)
CO2: 29 mEq/L (ref 19–32)
Calcium: 10.5 mg/dL (ref 8.4–10.5)
Chloride: 106 mEq/L (ref 96–112)
Creatinine, Ser: 1 mg/dL (ref 0.50–1.35)
GFR calc Af Amer: >90 mL/min (ref >90)
GFR calc non Af Amer: >90 mL/min (ref >90)
Glucose, Bld: 116 mg/dL — ABNORMAL HIGH (ref 70–99)
Potassium: 4.4 mEq/L (ref 3.5–5.1)
Sodium: 143 mEq/L (ref 135–145)
Total Bilirubin: 0.5 mg/dL (ref 0.3–1.2)
Total Protein: 8.1 g/dL (ref 6.0–8.3)

## 2012-02-27 LAB — ETHANOL: Alcohol, Ethyl (B): <11 mg/dL (ref 0–11)

## 2012-02-27 LAB — CBC WITH DIFFERENTIAL/PLATELET
Basophils Absolute: 0 10*3/uL (ref 0.0–0.1)
Basophils Relative: 0 % (ref 0–1)
Eosinophils Absolute: 0 10*3/uL (ref 0.0–0.7)
Eosinophils Relative: 0 % (ref 0–5)
HCT: 47.7 % (ref 39.0–52.0)
Hemoglobin: 16.1 g/dL (ref 13.0–17.0)
Lymphocytes Relative: 8 % — ABNORMAL LOW (ref 12–46)
Lymphs Abs: 0.6 10*3/uL — ABNORMAL LOW (ref 0.7–4.0)
MCH: 30.9 pg (ref 26.0–34.0)
MCHC: 33.8 g/dL (ref 30.0–36.0)
MCV: 91.6 fL (ref 78.0–100.0)
Monocytes Absolute: 0.1 10*3/uL (ref 0.1–1.0)
Monocytes Relative: 1 % — ABNORMAL LOW (ref 3–12)
Neutro Abs: 7.2 10*3/uL (ref 1.7–7.7)
Neutrophils Relative %: 91 % — ABNORMAL HIGH (ref 43–77)
Platelets: 175 10*3/uL (ref 150–400)
RBC: 5.21 MIL/uL (ref 4.22–5.81)
RDW: 12.4 % (ref 11.5–15.5)
WBC: 7.9 10*3/uL (ref 4.0–10.5)

## 2012-02-27 LAB — LITHIUM LEVEL: Lithium Lvl: <0.25 mEq/L — ABNORMAL LOW (ref 0.80–1.40)

## 2012-02-27 LAB — RAPID URINE DRUG SCREEN, HOSP PERFORMED
Amphetamines: NOT DETECTED
Barbiturates: NOT DETECTED
Benzodiazepines: POSITIVE — AB
Cocaine: NOT DETECTED
Opiates: NOT DETECTED
Tetrahydrocannabinol: NOT DETECTED

## 2012-02-27 NOTE — ED Notes (Signed)
Pt requesting to eat. Pt reminded that he cannot eat while site of extracted teeth is still bleeding. Pt verbalized understanding.

## 2012-02-27 NOTE — ED Notes (Signed)
Pt impulsively getting out of bed and ambulating with unsteady gait. Pt expressed need to use urinal and given urinal. Pt instructed to use call bell for assistance before standing.

## 2012-02-27 NOTE — ED Notes (Signed)
Phlebotomy at bedside.
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
## 2012-04-02 ENCOUNTER — Encounter (HOSPITAL_COMMUNITY): Payer: Self-pay | Admitting: Emergency Medicine

## 2012-04-02 ENCOUNTER — Emergency Department (HOSPITAL_COMMUNITY)
Admission: EM | Admit: 2012-04-02 | Discharge: 2012-04-03 | Disposition: A | Discharge status: Home / Self Care | Payer: Medicaid Other | Attending: Emergency Medicine | Admitting: Emergency Medicine

## 2012-04-02 DIAGNOSIS — F209 Schizophrenia, unspecified: Secondary | ICD-10-CM | POA: Insufficient documentation

## 2012-04-02 DIAGNOSIS — Z5900 Homelessness unspecified: Secondary | ICD-10-CM | POA: Insufficient documentation

## 2012-04-02 DIAGNOSIS — Z79899 Other long term (current) drug therapy: Secondary | ICD-10-CM | POA: Insufficient documentation

## 2012-04-02 DIAGNOSIS — T1491XA Suicide attempt, initial encounter: Secondary | ICD-10-CM

## 2012-04-02 DIAGNOSIS — T438X2A Poisoning by other psychotropic drugs, intentional self-harm, initial encounter: Secondary | ICD-10-CM | POA: Insufficient documentation

## 2012-04-02 DIAGNOSIS — T43502A Poisoning by unspecified antipsychotics and neuroleptics, intentional self-harm, initial encounter: Secondary | ICD-10-CM | POA: Insufficient documentation

## 2012-04-02 DIAGNOSIS — T433X1A Poisoning by phenothiazine antipsychotics and neuroleptics, accidental (unintentional), initial encounter: Secondary | ICD-10-CM | POA: Insufficient documentation

## 2012-04-02 DIAGNOSIS — J45909 Unspecified asthma, uncomplicated: Secondary | ICD-10-CM | POA: Insufficient documentation

## 2012-04-02 DIAGNOSIS — F489 Nonpsychotic mental disorder, unspecified: Secondary | ICD-10-CM | POA: Insufficient documentation

## 2012-04-02 LAB — COMPREHENSIVE METABOLIC PANEL
ALT: 17 U/L (ref 0–53)
AST: 24 U/L (ref 0–37)
Albumin: 4.1 g/dL (ref 3.5–5.2)
Alkaline Phosphatase: 125 U/L — ABNORMAL HIGH (ref 39–117)
BUN: 11 mg/dL (ref 6–23)
CO2: 22 mEq/L (ref 19–32)
Calcium: 9.9 mg/dL (ref 8.4–10.5)
Chloride: 103 mEq/L (ref 96–112)
Creatinine, Ser: 0.97 mg/dL (ref 0.50–1.35)
GFR calc Af Amer: >90 mL/min (ref >90)
GFR calc non Af Amer: >90 mL/min (ref >90)
Glucose, Bld: 132 mg/dL — ABNORMAL HIGH (ref 70–99)
Potassium: 3.6 mEq/L (ref 3.5–5.1)
Sodium: 138 mEq/L (ref 135–145)
Total Bilirubin: 0.3 mg/dL (ref 0.3–1.2)
Total Protein: 8.2 g/dL (ref 6.0–8.3)

## 2012-04-02 LAB — CBC
HCT: 48.9 % (ref 39.0–52.0)
Hemoglobin: 16.8 g/dL (ref 13.0–17.0)
MCH: 30.8 pg (ref 26.0–34.0)
MCHC: 34.4 g/dL (ref 30.0–36.0)
MCV: 89.6 fL (ref 78.0–100.0)
Platelets: 181 10*3/uL (ref 150–400)
RBC: 5.46 MIL/uL (ref 4.22–5.81)
RDW: 11.9 % (ref 11.5–15.5)
WBC: 6 10*3/uL (ref 4.0–10.5)

## 2012-04-02 LAB — SALICYLATE LEVEL: Salicylate Lvl: <2 mg/dL — ABNORMAL LOW (ref 2.8–20.0)

## 2012-04-02 LAB — LITHIUM LEVEL: Lithium Lvl: <0.25 mEq/L — ABNORMAL LOW (ref 0.80–1.40)

## 2012-04-02 LAB — ETHANOL: Alcohol, Ethyl (B): <11 mg/dL (ref 0–11)

## 2012-04-02 LAB — RAPID URINE DRUG SCREEN, HOSP PERFORMED
Amphetamines: NOT DETECTED
Barbiturates: NOT DETECTED
Benzodiazepines: NOT DETECTED
Cocaine: NOT DETECTED
Opiates: NOT DETECTED
Tetrahydrocannabinol: NOT DETECTED

## 2012-04-02 LAB — ACETAMINOPHEN LEVEL: Acetaminophen (Tylenol), Serum: <15 ug/mL (ref 10–30)

## 2012-04-02 MED ORDER — SODIUM CHLORIDE 0.9 % IV SOLN
1000.0000 mL | Freq: Once | INTRAVENOUS | Status: AC
  Administered 2012-04-02: 1000 mL via INTRAVENOUS

## 2012-04-02 MED ORDER — CHLORPROMAZINE HCL 25 MG PO TABS: 100.0000 mg | ORAL_TABLET | Freq: Every day | ORAL | Status: DC

## 2012-04-02 MED ORDER — ALBUTEROL SULFATE HFA 108 (90 BASE) MCG/ACT IN AERS: 2.0000 | INHALATION_SPRAY | RESPIRATORY_TRACT | Status: DC | PRN

## 2012-04-02 MED ORDER — SODIUM CHLORIDE 0.9 % IV SOLN: 1000.0000 mL | INTRAVENOUS | Status: DC

## 2012-04-02 MED ORDER — SODIUM CHLORIDE 0.9 % IV BOLUS (SEPSIS)
1000.0000 mL | Freq: Once | INTRAVENOUS | Status: AC
  Administered 2012-04-02: 1000 mL via INTRAVENOUS

## 2012-04-02 NOTE — ED Provider Notes (Addendum)
History     CSN: [id number]  Arrival date & time 04/02/12  1440   First MD Initiated Contact with Patient 04/02/12 1515      Chief Complaint  Patient presents with  . Medical Clearance  . Ingestion     The history is provided by the patient.   the patient reports taking 17 200 mg Thorazine tabs an attempt to kill himself earlier today.  He denies coingestion.  He denies alcohol use.  He denies drug use.  He has a history of schizophrenia.  He states he is tired of living on this earth and is ready to die.  He took the medications in attempt to kill himself.  No hallucinations.  Past Medical History  Diagnosis Date  . Asthma   . Homeless   . Schizophrenia     History reviewed. No pertinent past surgical history.  No family history on file.  History  Substance Use Topics  . Smoking status: Not on file  . Smokeless tobacco: Not on file  . Alcohol Use: Yes      Review of Systems  All other systems reviewed and are negative.    Allergies  Peach seed and Sudafed  Home Medications   Current Outpatient Rx  Name  Route  Sig  Dispense  Refill  . albuterol (PROVENTIL HFA;VENTOLIN HFA) 108 (90 BASE) MCG/ACT inhaler   Inhalation   Inhale 2 puffs into the lungs every 6 (six) hours as needed. For wheezing         . chlorproMAZINE (THORAZINE) 100 MG tablet   Oral   Take 100 mg by mouth at bedtime.         . chlorproMAZINE (THORAZINE) 50 MG tablet   Oral   Take 50 mg by mouth 3 (three) times daily as needed (anxiety or agitation.).          Marland Kitchen haloperidol decanoate (HALDOL DECANOATE) 100 MG/ML injection   Intramuscular   Inject 175 mg into the muscle every 14 (fourteen) days.            BP 148/87  Pulse 92  Temp(Src) 98.4 F (36.9 C) (Oral)  Resp 16  SpO2 93%  Physical Exam  Nursing note and vitals reviewed. Constitutional: He is oriented to person, place, and time. He appears well-developed and well-nourished.  HENT:  Head: Normocephalic and  atraumatic.  Eyes: EOM are normal.  Neck: Normal range of motion.  Cardiovascular: Regular rhythm, normal heart sounds and intact distal pulses.   Tachycardia  Pulmonary/Chest: Effort normal and breath sounds normal. No respiratory distress.  Abdominal: Soft. He exhibits no distension. There is no tenderness.  Musculoskeletal: Normal range of motion.  Neurological: He is alert and oriented to person, place, and time.  Skin: Skin is warm and dry.  Psychiatric: He has a normal mood and affect. Judgment normal.    ED Course  Procedures   Labs Reviewed  COMPREHENSIVE METABOLIC PANEL - Abnormal; Notable for the following:    Glucose, Bld 132 (*)    Alkaline Phosphatase 125 (*)    All other components within normal limits  SALICYLATE LEVEL - Abnormal; Notable for the following:    Salicylate Lvl <2.0 (*)    All other components within normal limits  CBC  ETHANOL  ACETAMINOPHEN LEVEL  URINE RAPID DRUG SCREEN (HOSP PERFORMED)  LITHIUM LEVEL   No results found.   1. Suicide attempt   2. Schizophrenia       MDM  I spoke with  the [facility] who recommended observation emergency department for 4 hours.  With IV fluids his heart rate is improved down to the 70s.  He is no longer demonstrating signs of anticholinergic toxicity.  The patient will be transferred to the psychiatric portion of the emergency department in the behavioral health assessment team has been involved and will look for placement for his suicidal ideation.    Date: 04/02/2012  Rate: 147  Rhythm: Sinus tachycardia   QRS Axis: normal  Intervals: normal  ST/T Wave abnormalities: normal  Conduction Disutrbances: none  Narrative Interpretation:   Old EKG Reviewed: Sinus tachycardia is new         Lyanne Co, MD 04/02/12 1759  The patient was seen and evaluated by the psychiatrist recommends acute psychiatric hospitalization.  I the psychiatrist recommends Haldol 5 mg twice a day, Cogentin 0.5 mg  twice a day, Haldol 5 mg IM/by mouth every 6 hours when necessary agitation, Ativan 2 mg IM by mouth every 6 hours when necessary agitation.  Please see consultation note for full details.  Lyanne Co, MD 04/03/12 0004  Lyanne Co, MD 04/03/12 [phone]

## 2012-04-02 NOTE — ED Notes (Signed)
States that he has no reason to live. States that all of his family is always trying to put him away and he is trying to change his life.

## 2012-04-02 NOTE — ED Notes (Signed)
Pt actively vomiting in triage x 1.  
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
## 2012-04-02 NOTE — ED Notes (Signed)
[employer] and security at bedside 

## 2012-04-02 NOTE — ED Notes (Signed)
Tele-psych call and faxed information

## 2012-04-02 NOTE — ED Notes (Signed)
States that he took 18 total pills of Thorazine in attempts to kill himself. States that he has no reason to live.

## 2012-04-03 ENCOUNTER — Telehealth (HOSPITAL_COMMUNITY): Payer: Self-pay | Admitting: Behavioral Health

## 2012-04-03 DIAGNOSIS — F259 Schizoaffective disorder, unspecified: Secondary | ICD-10-CM

## 2012-04-03 MED ORDER — DIVALPROEX SODIUM ER 500 MG PO TB24
1000.0000 mg | ORAL_TABLET | Freq: Every day | ORAL | Status: DC
Start: 2012-04-03 — End: 2015-02-23

## 2012-04-03 MED ORDER — LORAZEPAM 1 MG PO TABS
2.0000 mg | ORAL_TABLET | Freq: Four times a day (QID) | ORAL | Status: DC | PRN
  Administered 2012-04-03: 2 mg via ORAL
  Filled 2012-04-03: qty 2

## 2012-04-03 MED ORDER — BENZTROPINE MESYLATE 1 MG/ML IJ SOLN
0.5000 mg | Freq: Two times a day (BID) | INTRAMUSCULAR | Status: DC
  Administered 2012-04-03 (×2): 0.5 mg via INTRAMUSCULAR
  Filled 2012-04-03 (×2): qty 2

## 2012-04-03 MED ORDER — LORAZEPAM 2 MG/ML IJ SOLN: 2.0000 mg | Freq: Four times a day (QID) | INTRAMUSCULAR | Status: DC | PRN

## 2012-04-03 MED ORDER — BENZTROPINE MESYLATE 1 MG/ML IJ SOLN: 0.5000 mg | Freq: Once | INTRAMUSCULAR | Status: DC

## 2012-04-03 MED ORDER — HALOPERIDOL 5 MG PO TABS
5.0000 mg | ORAL_TABLET | Freq: Two times a day (BID) | ORAL | Status: DC
  Administered 2012-04-03 (×2): 5 mg via ORAL
  Filled 2012-04-03 (×2): qty 1

## 2012-04-03 MED ORDER — HALOPERIDOL 5 MG PO TABS: 5.0000 mg | ORAL_TABLET | Freq: Two times a day (BID) | ORAL | Status: DC

## 2012-04-03 MED ORDER — HALOPERIDOL LACTATE 5 MG/ML IJ SOLN: 5.0000 mg | Freq: Four times a day (QID) | INTRAMUSCULAR | Status: DC | PRN

## 2012-04-03 MED ORDER — BENZTROPINE MESYLATE 1 MG PO TABS: 0.5000 mg | ORAL_TABLET | Freq: Two times a day (BID) | ORAL | Status: DC

## 2012-04-03 NOTE — ED Notes (Signed)
2 bags pt belongings placed in locker

## 2012-04-03 NOTE — Progress Notes (Signed)
CSW faxed pt information to the following: [facility] (p) [phone] (f) [phone] [facility] (p) [phone] (f) [phone] [facility] (p) [phone] [facility] [phone]  The following places have no beds available this morning and are expecting no d/c today: [facility] (p) [phone] [facility] (p) [phone] [facility] (p) [phone] [facility] (p) [phone] [facility] (p) [phone]  CSW will continue to assist with placement.  Vickii Penna, LCSWA [phone]  Clinical Social Work

## 2012-04-03 NOTE — Progress Notes (Signed)
CSW was approached by [employer] who was inquiring about pt projected length of stay.  CSW informed [employer] that if pt was accepted to the [facility] wait list it could take up to 7-10 days, but CSW would obtain a clearer picture of wait once pt was accepted to wait list.  [employer] chief was requesting this information for scheduling purposes since [employer] must remain with this pt throughout the pt stay here at [facility].  CSW will continue to update [employer].  Vickii Penna, LCSWA [phone]  Clinical Social Work

## 2012-04-03 NOTE — Progress Notes (Signed)
Per discussion with [facility] at [facility], patient is more appropriate at [facility] due to aggressive behaviors. CSW discussed case with act team, who will assist with further disposition.   Catha Gosselin, LCSWA  [phone] .04/03/2012 1200am

## 2012-04-03 NOTE — Consult Note (Signed)
Reason for Consult: Schizophrenia and is status post overdose as impulsive behavior Referring Physician: Dr. Christoper Allegra is an 37 y.o. male.  HPI: Patient was seen and chart reviewed. Patient stated that the he had an augmented with his girlfriend of 3 years which made him in pursuing [facility]. Patient reported taking in addition medication of Thorazine. Patient stated that his medication was Haldol to contract [address] and also has been followed up with Act services. Patient denied current symptoms psychosis, suicidal ideation intention or plans are evidence of psychosis.  MSE: Patient was calm and quite cooperative to he was standing at the entrance of his room door unable to sit and talk without difficulties. Patient stated mood was fine his affect was appropriate he does not want to be in all planes with his girlfriend. Patient denies suicidal or homicidal ideation intentions or plans. Patient has no evidence of psychotic symptoms. Patient has no agitation and aggressive behaviors.  Past Medical History  Diagnosis Date  . Asthma   . Homeless   . Schizophrenia     History reviewed. No pertinent past surgical history.  No family history on file.  Social History:  reports that  drinks alcohol. He reports that he uses illicit drugs. His tobacco history is not on file.  Allergies:  Allergies  Allergen Reactions  . Peach Seed Swelling  . Sudafed (Pseudoephedrine) Swelling and Rash    Medications: I have reviewed the patient's current medications.  Results for orders placed during the hospital encounter of 04/02/12 (from the past 48 hour(s))  CBC     Status: None   Collection Time    04/02/12  2:58 PM      Result Value Range   WBC 6.0  4.0 - 10.5 K/uL   RBC 5.46  4.22 - 5.81 MIL/uL   Hemoglobin 16.8  13.0 - 17.0 g/dL   HCT 16.1  09.6 - 04.5 %   MCV 89.6  78.0 - 100.0 fL   MCH 30.8  26.0 - 34.0 pg   MCHC 34.4  30.0 - 36.0 g/dL   RDW 40.9  81.1 - 91.4 %   Platelets 181  150 - 400 K/uL  COMPREHENSIVE METABOLIC PANEL     Status: Abnormal   Collection Time    04/02/12  2:58 PM      Result Value Range   Sodium 138  135 - 145 mEq/L   Potassium 3.6  3.5 - 5.1 mEq/L   Chloride 103  96 - 112 mEq/L   CO2 22  19 - 32 mEq/L   Glucose, Bld 132 (*) 70 - 99 mg/dL   BUN 11  6 - 23 mg/dL   Creatinine, Ser 7.82  0.50 - 1.35 mg/dL   Calcium 9.9  8.4 - 95.6 mg/dL   Total Protein 8.2  6.0 - 8.3 g/dL   Albumin 4.1  3.5 - 5.2 g/dL   AST 24  0 - 37 U/L   ALT 17  0 - 53 U/L   Alkaline Phosphatase 125 (*) 39 - 117 U/L   Total Bilirubin 0.3  0.3 - 1.2 mg/dL   GFR calc non Af Amer >90  >90 mL/min   GFR calc Af Amer >90  >90 mL/min   Comment:            The eGFR has been calculated     using the CKD EPI equation.     This calculation has not been     validated in  all clinical     situations.     eGFR's persistently     <90 mL/min signify     possible Chronic Kidney Disease.  ETHANOL     Status: None   Collection Time    04/02/12  2:58 PM      Result Value Range   Alcohol, Ethyl (B) <11  0 - 11 mg/dL   Comment:            LOWEST DETECTABLE LIMIT FOR     SERUM ALCOHOL IS 11 mg/dL     FOR MEDICAL PURPOSES ONLY  ACETAMINOPHEN LEVEL     Status: None   Collection Time    04/02/12  2:58 PM      Result Value Range   Acetaminophen (Tylenol), Serum <15.0  10 - 30 ug/mL   Comment:            THERAPEUTIC CONCENTRATIONS VARY     SIGNIFICANTLY. A RANGE OF 10-30     ug/mL MAY BE AN EFFECTIVE     CONCENTRATION FOR MANY PATIENTS.     HOWEVER, SOME ARE BEST TREATED     AT CONCENTRATIONS OUTSIDE THIS     RANGE.     ACETAMINOPHEN CONCENTRATIONS     >150 ug/mL AT 4 HOURS AFTER     INGESTION AND >50 ug/mL AT 12     HOURS AFTER INGESTION ARE     OFTEN ASSOCIATED WITH TOXIC     REACTIONS.  SALICYLATE LEVEL     Status: Abnormal   Collection Time    04/02/12  2:58 PM      Result Value Range   Salicylate Lvl <2.0 (*) 2.8 - 20.0 mg/dL  URINE RAPID DRUG SCREEN  (HOSP PERFORMED)     Status: None   Collection Time    04/02/12  5:43 PM      Result Value Range   Opiates NONE DETECTED  NONE DETECTED   Cocaine NONE DETECTED  NONE DETECTED   Benzodiazepines NONE DETECTED  NONE DETECTED   Amphetamines NONE DETECTED  NONE DETECTED   Tetrahydrocannabinol NONE DETECTED  NONE DETECTED   Barbiturates NONE DETECTED  NONE DETECTED   Comment:            DRUG SCREEN FOR MEDICAL PURPOSES     ONLY.  IF CONFIRMATION IS NEEDED     FOR ANY PURPOSE, NOTIFY LAB     WITHIN 5 DAYS.                LOWEST DETECTABLE LIMITS     FOR URINE DRUG SCREEN     Drug Class       Cutoff (ng/mL)     Amphetamine      1000     Barbiturate      200     Benzodiazepine   200     Tricyclics       300     Opiates          300     Cocaine          300     THC              50  LITHIUM LEVEL     Status: Abnormal   Collection Time    04/02/12  5:59 PM      Result Value Range   Lithium Lvl <0.25 (*) 0.80 - 1.40 mEq/L   Comment: REPEATED TO VERIFY    No results found.  Positive for  aggressive behavior and mood swings Blood pressure 112/83, pulse 118, temperature 98.8 F (37.1 C), temperature source Oral, resp. rate 16, SpO2 98.00%.   Assessment/Plan: Schizoaffective disorder   Patient does not meet criteria for acute psychiatric hospitalization. Patient does not need medical hospitalization. Patient was referred to the outpatient psychiatry services and ACT services at [facility]. We provide Depakote 500 mg to be sent bedtime to control the agitation and aggressive behaviors.   Brittie Whisnant,JANARDHAHA R. 04/03/2012, 2:13 PM

## 2012-04-03 NOTE — BHH Counselor (Signed)
Once the pt was d/c'd by Dr. Elsie Saas, and left the unit; the IVC paper work was overlooked by Anselm Jungling. Kesler brought the paper work to Conservation officer, nature and informed me that the "IVC was not rescinded by Dr. Shela Commons". This pt was being cared for by the CSW team and not ACT. The shift report indicated that the pt was Voluntary.    04/02/12@1600 -Theotis Burrow, MHAQP is petitioner for IVC  04/03/12@1413 -Patient does not meet criteria for acute psychiatric hospitalization. Patient does not need medical hospitalization. Patient was referred to the outpatient psychiatry services and ACT services at [facility]. We provide Depakote 500 mg to be sent bedtime to control the agitation and aggressive behaviors.  JONNALAGADDA,JANARDHAHA R.  04/03/12@1500 -Psychiatrist stated pt is ready to go-Rachel Unice Bailey, RN  04/03/12@1549 -ED CM consulted by ACT member about d/c of pt CM consulted with [facility]. ACT and director discussed concerns-Kimberly Illene Silver, RN Ranae Pila, LCAS

## 2012-04-03 NOTE — Progress Notes (Signed)
ED CM consulted by A Brown Referred her to ED SW, Almira Coaster. CM spoke with ACT member,  Sink and ED SW Almira Coaster about pt concerns

## 2012-04-03 NOTE — ED Notes (Signed)
Psychiatrist stated pt is ready to go

## 2012-04-03 NOTE — BHH Suicide Risk Assessment (Signed)
Suicide Risk Assessment  Discharge Assessment     Demographic Factors:  Male, Adolescent or young adult and Low socioeconomic status  Mental Status Per Nursing Assessment::   On Admission:     Current Mental Status by Physician: Patient denies suicidal or homicidal ideation intentions or plans. Patient stated he was so upset and mad after an argument with his girlfriend and took the overdose.  Loss Factors: Financial problems/change in socioeconomic status  Historical Factors: Impulsivity  Risk Reduction Factors:   Sense of responsibility to family, Religious beliefs about death, Living with another person, especially a relative, Positive social support and Positive therapeutic relationship  Continued Clinical Symptoms:  Schizophrenia:   Paranoid or undifferentiated type Unstable or Poor Therapeutic Relationship Previous Psychiatric Diagnoses and Treatments  Cognitive Features That Contribute To Risk:  Polarized thinking    Suicide Risk:  Minimal: No identifiable suicidal ideation.  Patients presenting with no risk factors but with morbid ruminations; may be classified as minimal risk based on the severity of the depressive symptoms  Discharge Diagnoses:   AXIS I:  Schizoaffective Disorder AXIS II:  Deferred AXIS III:   Past Medical History  Diagnosis Date  . Asthma   . Homeless   . Schizophrenia    AXIS IV:  educational problems, occupational problems, other psychosocial or environmental problems and problems related to social environment AXIS V:  41-50 serious symptoms  Plan Of Care/Follow-up recommendations:  Activity:  As tolerated Diet:  Regular  Is patient on multiple antipsychotic therapies at discharge:  No   Has Patient had three or more failed trials of antipsychotic monotherapy by history:  No  Recommended Plan for Multiple Antipsychotic Therapies: Not applicable  Jarett Dralle,JANARDHAHA R. 04/03/2012, 2:19 PM

## 2012-04-03 NOTE — Progress Notes (Signed)
CSW completed [facility] paperwork and faxed to [facility].  CSW obtained [facility] Authorization good through 02/12-02/19/2014 authorization # [id number].  CSW called [facility] to complete phone interview.  Pt will be reviewed for wait list at [facility].  Once pt has been reviewed and accepted CSW will request pt to be expedited based on hx of aggressiveness.  CSW will continue to update EDP, RN, [employer], Director of Nursing and ACT.  Vickii Penna, LCSWA [phone]  Clinical Social Work

## 2012-04-03 NOTE — Progress Notes (Signed)
CSW called tele-psych to request written report.  Tele-psych recommended "Admit to Psych Unit due to risk of harm to self and others."  Medications:  Haldol 5mg  BID Cogentin 0.5mg  BID  Haldol 5mg  IM/po q 6H prn agitation Ativan 2mg  IM/po q 6H prn agitation  CSW made RN and EDP aware.  Vickii Penna, LCSWA [phone]  Clinical Social Work

## 2012-04-03 NOTE — Progress Notes (Signed)
ED CM consulted by ACT member about d/c of pt CM consulted with [facility]. ACT and director discussed concerns

## 2012-04-03 NOTE — ED Notes (Signed)
Pt sleeping. Respirations even and unlabored. Bilateral rise and fall of chest. Skin warm and dry. In no acute distress.   

## 2012-04-03 NOTE — Progress Notes (Signed)
CSW received a call from Dalene Seltzer, Interior and spatial designer of Nursing.  CSW updated Director on status of pt and pt behaviors.  CSW reported to Director that [employer] is instructed to remain with pt throughout the duration of his stay at [facility].  At this time there are two [employer] officers at roomside with pt.  CSW will update Director of changes.  Vickii Penna, LCSWA [phone]  Clinical Social Work

## 2013-12-01 ENCOUNTER — Other Ambulatory Visit: Payer: Medicaid Other

## 2014-05-01 ENCOUNTER — Emergency Department (HOSPITAL_COMMUNITY)
Admission: EM | Admit: 2014-05-01 | Discharge: 2014-05-01 | Disposition: A | Discharge status: Home / Self Care | Payer: Medicaid Other | Attending: Emergency Medicine | Admitting: Emergency Medicine

## 2014-05-01 ENCOUNTER — Encounter (HOSPITAL_COMMUNITY): Payer: Self-pay | Admitting: Cardiology

## 2014-05-01 DIAGNOSIS — F209 Schizophrenia, unspecified: Secondary | ICD-10-CM | POA: Diagnosis not present

## 2014-05-01 DIAGNOSIS — R040 Epistaxis: Secondary | ICD-10-CM | POA: Diagnosis not present

## 2014-05-01 DIAGNOSIS — R6883 Chills (without fever): Secondary | ICD-10-CM | POA: Diagnosis not present

## 2014-05-01 DIAGNOSIS — Z590 Homelessness: Secondary | ICD-10-CM | POA: Insufficient documentation

## 2014-05-01 DIAGNOSIS — Z79899 Other long term (current) drug therapy: Secondary | ICD-10-CM | POA: Diagnosis not present

## 2014-05-01 DIAGNOSIS — Z720 Tobacco use: Secondary | ICD-10-CM | POA: Insufficient documentation

## 2014-05-01 DIAGNOSIS — J45909 Unspecified asthma, uncomplicated: Secondary | ICD-10-CM | POA: Insufficient documentation

## 2014-05-01 DIAGNOSIS — F319 Bipolar disorder, unspecified: Secondary | ICD-10-CM | POA: Diagnosis not present

## 2014-05-01 LAB — I-STAT CHEM 8, ED
BUN: 8 mg/dL (ref 6–23)
Calcium, Ion: 1.27 mmol/L — ABNORMAL HIGH (ref 1.12–1.23)
Chloride: 103 mmol/L (ref 96–112)
Creatinine, Ser: 1.2 mg/dL (ref 0.50–1.35)
Glucose, Bld: 92 mg/dL (ref 70–99)
HCT: 53 % — ABNORMAL HIGH (ref 39.0–52.0)
Hemoglobin: 18 g/dL — ABNORMAL HIGH (ref 13.0–17.0)
Potassium: 4 mmol/L (ref 3.5–5.1)
Sodium: 143 mmol/L (ref 135–145)
TCO2: 25 mmol/L (ref 0–100)

## 2014-05-01 LAB — CBC
HCT: 47.5 % (ref 39.0–52.0)
Hemoglobin: 16.1 g/dL (ref 13.0–17.0)
MCH: 31.1 pg (ref 26.0–34.0)
MCHC: 33.9 g/dL (ref 30.0–36.0)
MCV: 91.7 fL (ref 78.0–100.0)
Platelets: 184 10*3/uL (ref 150–400)
RBC: 5.18 MIL/uL (ref 4.22–5.81)
RDW: 12.2 % (ref 11.5–15.5)
WBC: 7.3 10*3/uL (ref 4.0–10.5)

## 2014-05-01 LAB — URINALYSIS, ROUTINE W REFLEX MICROSCOPIC
Bilirubin Urine: NEGATIVE
Glucose, UA: NEGATIVE mg/dL
Hgb urine dipstick: NEGATIVE
Ketones, ur: NEGATIVE mg/dL
Leukocytes, UA: NEGATIVE
Nitrite: NEGATIVE
Protein, ur: NEGATIVE mg/dL
Specific Gravity, Urine: 1.01 (ref 1.005–1.030)
Urobilinogen, UA: 1 mg/dL (ref 0.0–1.0)
pH: 7 (ref 5.0–8.0)

## 2014-05-01 MED ORDER — IBUPROFEN 800 MG PO TABS
800.0000 mg | ORAL_TABLET | Freq: Once | ORAL | Status: AC
  Administered 2014-05-01: 800 mg via ORAL
  Filled 2014-05-01: qty 1

## 2014-05-01 NOTE — ED Notes (Signed)
Pt comfortable with discharge and follow up instructions. Pt declines wheelchair, escorted to waiting area by this RN. No prescriptions. 

## 2014-05-01 NOTE — ED Notes (Signed)
Pt family at desk stating that patient was recently dx with a brain tumor and they were told to come to ED immediately if he experienced any nose bleeds or bleeding from ear. This information was not provided upon check in. Acuity adjusted.

## 2014-05-01 NOTE — Discharge Instructions (Signed)
Nosebleed Nosebleeds can be caused by many conditions, including trauma, infections, polyps, foreign bodies, dry mucous membranes or climate, medicines, and air conditioning. Most nosebleeds occur in the front of the nose. Because of this location, most nosebleeds can be controlled by pinching the nostrils gently and continuously for at least 10 to 20 minutes. The long, continuous pressure allows enough time for the blood to clot. If pressure is released during that 10 to 20 minute time period, the process may have to be started again. The nosebleed may stop by itself or quit with pressure, or it may need concentrated heating (cautery) or pressure from packing. HOME CARE INSTRUCTIONS   If your nose was packed, try to maintain the pack inside until your health care provider removes it. If a gauze pack was used and it starts to fall out, gently replace it or cut the end off. Do not cut if a balloon catheter was used to pack the nose. Otherwise, do not remove unless instructed.  Avoid blowing your nose for 12 hours after treatment. This could dislodge the pack or clot and start the bleeding again.  If the bleeding starts again, sit up and bend forward, gently pinching the front half of your nose continuously for 20 minutes.  If bleeding was caused by dry mucous membranes, use over-the-counter saline nasal spray or gel. This will keep the mucous membranes moist and allow them to heal. If you must use a lubricant, choose the water-soluble variety. Use it only sparingly and not within several hours of lying down.  Do not use petroleum jelly or mineral oil, as these may drip into the lungs and cause serious problems.  Maintain humidity in your home by using less air conditioning or by using a humidifier.  Do not use aspirin or medicines which make bleeding more likely. Your health care provider can give you recommendations on this.  Resume normal activities as you are able, but try to avoid straining,  lifting, or bending at the waist for several days.  If the nosebleeds become recurrent and the cause is unknown, your health care provider may suggest laboratory tests. SEEK MEDICAL CARE IF: You have a fever. SEEK IMMEDIATE MEDICAL CARE IF:   Bleeding recurs and cannot be controlled.  There is unusual bleeding from or bruising on other parts of the body.  Nosebleeds continue.  There is any worsening of the condition which originally brought you in.  You become light-headed, feel faint, become sweaty, or vomit blood. MAKE SURE YOU:   Understand these instructions.  Will watch your condition.  Will get help right away if you are not doing well or get worse. Document Released: 11/16/2004 Document Revised: 06/23/2013 Document Reviewed: 01/07/2009 [facility] Patient Information 2015 [place], [place]. This information is not intended to replace advice given to you by your health care provider. Make sure you discuss any questions you have with your health care provider.   Emergency Department Resource Guide 1) Find a Doctor and Pay Out of Pocket Although you won't have to find out who is covered by your insurance plan, it is a good idea to ask around and get recommendations. You will then need to call the office and see if the doctor you have chosen will accept you as a new patient and what types of options they offer for patients who are self-pay. Some doctors offer discounts or will set up payment plans for their patients who do not have insurance, but you will need to ask so you  aren't surprised when you get to your appointment.  2) Contact Your Local Health Department Not all health departments have doctors that can see patients for sick visits, but many do, so it is worth a call to see if yours does. If you don't know where your local health department is, you can check in your phone book. The CDC also has a tool to help you locate your state's health department, and many state websites  also have listings of all of their local health departments.  3) Find a Walk-in Clinic If your illness is not likely to be very severe or complicated, you may want to try a walk in clinic. These are popping up all over the country in pharmacies, drugstores, and shopping centers. They're usually staffed by nurse practitioners or physician assistants that have been trained to treat common illnesses and complaints. They're usually fairly quick and inexpensive. However, if you have serious medical issues or chronic medical problems, these are probably not your best option.  No Primary Care Doctor: - Call Health Connect at  [phone] - they can help you locate a primary care doctor that  accepts your insurance, provides certain services, etc. - Physician Referral Service- [phone]  Chronic Pain Problems: Organization         Address  Phone   Notes  [facility]  [phone]) [phone] Patients need to be referred by their primary care doctor.   Medication Assistance: Organization         Address  Phone   Notes  [facility] Medication [facility] [address] [place], [place] [address] [phone]) [phone] --Must be a resident of [place] [facility] [place] [facility] -- Must have NO insurance coverage whatsoever (no Medicaid/ Medicare, etc.) -- The pt. MUST have a primary care doctor that directs their care regularly and follows them in the community   MedAssist  [phone]   [employer]  [phone]) [phone]    Agencies that provide inexpensive medical care: Organization         Address  Phone   Notes  [facility]  [phone]   [facility]    [phone]   [facility] [address] [place], [place] [address] [phone]) [phone]   [facility] [address], [place] [phone]) [phone]   Planned Parenthood    [phone]) [phone]   [facility]    [phone]   [facility]   [address], [place] Phone:  [phone](336) [phone], Fax:  [phone] Hours of Operation:  9 am - 6 pm, M-F.  Also accepts Medicaid/Medicare and self-pay.  [facility]  [address], [place] Phone: [phone](336) [phone], Fax: [phone]) [phone]. Hours of Operation:  8:30 am - 5:30 pm, M-F.  Also accepts Medicaid and self-pay.  [facility] [place] [address], [place] Phone: [phone]) [phone]   [facility] [address], [place], [place] [phone], Ext. [phone] Mondays & Thursdays: 7-9 AM.  First 15 patients are seen on a first come, first serve basis.    Medicaid-accepting [facility] [place] Providers:  Organization         Address  Phone   Notes  [facility] [address], [place] [phone] Also accepts self-pay patients.  [facility] [address], [place]  [phone]  [facility] [address], [place] [phone]) [phone]   [facility] [address] [place] [address], [place] [phone]) [phone]   Renaye RakersVeita Bland [address], [place]   [phone]) [phone] Only accepts [place] Access [place] patients after they have their name applied to their card.   Self-Pay (no insurance) in [facility]:  Organization         Address  Phone   Notes  Sickle Cell Patients, [facility] [address], [place] [phone]) [phone]   [facility] [address], [place] [phone]) [phone]   [facility] [place]  [address], [place] [phone]) [phone]   [facility]/Dr. Osei-Bonsu  [address] [place] [address], [place] or [id number], [place] [phone] Phone number for both [place] and [place] locations is the same.  [facility] [address], [place] [phone]   [facility] [address] [place] [address],  [place] or [address] [phone]) [phone]) [phone]   [facility] [address], [place] [phone]) [phone], phone; [phone](336) [phone], fax Sees patients 1st and 3rd Saturday of every month.  Must not qualify for public or private insurance (i.e. Medicaid, Medicare, [place] Health Choice, Veterans' Benefits)  Household income should be no more than 200% of the poverty level The clinic cannot treat you if you are pregnant or think you are pregnant  Sexually transmitted diseases are not treated at the clinic.    Dental Care: Organization         Address  Phone  Notes  [facility] of [facility] [address], [place] [phone] Accepts children up to age 38 who are enrolled in [place]icaid or [place] Health Choice; pregnant women with a Medicaid card; and children who have applied for Medicaid or [place] Health Choice, but were declined, whose parents can pay a reduced fee at time of service.  [facility] of [facility] [place]  [address], [place] [phone] Accepts children up to age 38 who are enrolled in [place]icaid or [place] Health Choice; pregnant women with a Medicaid card; and children who have applied for Medicaid or [place] Health Choice, but were declined, whose parents can pay a reduced fee at time of service.  [facility]  [address], [place] [phone]) [phone] Patients are seen by appointment only. Walk-ins are not accepted. [facility] will see patients 38 years of age and older. Monday - Tuesday (8am-5pm) Most Wednesdays (8:30-5pm) $30 per visit, cash only  [facility]  [address], [place] [phone] Patients are seen by appointment only. Walk-ins are not accepted. [facility] will see patients 10618 years of age and older. One Wednesday Evening (Monthly: Volunteer Based).  $30 per visit, cash only  [employer] [facility]  [phone] for adults; Children under age 334, call Graduate Pediatric Dentistry at [phone]. Children aged 444-14, please call [phone] to request a pediatric application.  Dental services are provided in all areas of dental care including fillings, crowns and bridges, complete and partial dentures, implants, gum treatment, root canals, and extractions. Preventive care is also provided. Treatment is provided to both adults and children. Patients are selected via a lottery and there is often a waiting list.  [facility] [address], [place]  [phone]) [phone] [email or web address]   [facility] [address], [place], [place] [phone], Ext. [phone] Second and Fourth Thursday of each month, opens at 6:30 AM; Clinic ends at 9 AM.  Patients are seen on a first-come first-served basis, and a limited number are seen during each clinic.   [facility]  [address], [place], [place] [phone]) [phone]   Eligibility Requirements You must have lived in [place], [place], or [place] counties for at least the last three months.   You cannot be eligible for state or federal sponsored National Cityhealthcare insurance, including CIGNAVeterans Administration, [place]icaid, or [employer] EntertainmentMedicare.   You generally cannot be eligible for healthcare insurance through your employer.    How to apply: Eligibility screenings are held every Tuesday and Wednesday afternoon from 1:00 pm until 4:00 pm. You do not need an appointment for the interview!  [facility] [address], [place], [place] [phone]   [facility]  [phone]   [facility]  [phone]   [facility]  [phone]    Behavioral Health Resources in the Community: Intensive Outpatient Programs Organization         Address  Phone  Notes  [facility] [address], [place], [place] [phone]     [facility] Outpatient [address], [place], [place] [phone]   [facility] [address], [place], [place]  [phone]   [facility] [address],  [place], [place] [phone] or [phone]   Substance Abuse Resources Organization         Address  Phone  Notes  [facility]  [phone]   [facility]  [phone]   [facility]  [phone]   [facility]  [phone]   Residential & Outpatient Substance Abuse Program  [phone]   Psychological Services Organization         Address  Phone  Notes  [facility]  [phone]- [phone]   [facility]  [phone]   [facility] [address], [place] [phone] or [phone]    Mobile Crisis Teams Organization         Address  Phone  Notes  [facility], Mobile Crisis Care Unit  [phone]   [facility]  [address] [place], [place] [phone]   Doristine LocksSharon DeEsch [address] [place] [phone]    Self-Help/Support Groups Organization         Address  Phone             Notes  [facility]ental [facility] Assoc[facility] of [place] - variety of support groups  336- [phone] Call for more information  Narcotics Anonymous (NA), [facility] [address], [place]  2 meetings at this location   Statisticianesidential Treatment Programs Organization         Address  Phone  Notes  [facility] [address],    [place]  [phone]   [facility]  [address], [place] [phone], [place], [place] [phone]   [facility] [address], [place] [phone] Admissions: 8am-3pm M-F  [facility] [address]    [place], [place] [phone]   [facility] [address], [place], [place] [phone]   [facility] [address]  [place],  [place] [phone]   [facility] - Intensive Outpatient [address], [place], [place] [phone]   [facility] ([facility] [address]  [place], [place] [phone] or [phone]   [facility] (RTS) [address] [place], [place] [phone] Accepts Medicaid  [facility] [address]  [place] [phone] Substance Abuse/Addiction Treatment   [facility] Behavioral Health Resources Organization         Address  Phone  Notes  [facility]  [phone]   Angie FavaJulie Brannon, PhD [address], [address] [place], [place]   [phone](336) [phone] or [phone]) [phone]   [facility]   [address] [place], [place] [phone]) [phone]   [facility] [address], [place], [place] [phone](336) [phone] Insurance/Medicaid/sponsorship through [facility]  Faith and Families [address]                                    [place], [place] [phone](336) [phone] Therapy/tele-psych/case  [facility] [address].   [place], [place] [phone](336) [phone]    Dr. Lolly MustacheArfeen  [phone](336) [phone]   [facility] [place]  [employer] [facility] 1) [address], [place] 2) [address], [place] 3)  [address], [place] [phone](336) [phone]  [phone](336) [phone]   [facility] Child Abuse Hotline [phone](336) [phone] or [phone](336) [phone] (After Hours)

## 2014-05-01 NOTE — ED Notes (Addendum)
Pt reports his nose stared bleeding this morning from the left side. No history of nose bleeds. States he just has not been feeling well, has had a headache and is suppose to see a neurologist.

## 2014-05-01 NOTE — ED Provider Notes (Signed)
CSN: [id number]     Arrival date & time 05/01/14  1142 History   First MD Initiated Contact with Patient 05/01/14 1346     Chief Complaint  Patient presents with  . Epistaxis     (Consider location/radiation/quality/duration/timing/severity/associated sxs/prior Treatment) Patient is a 39 y.o. male presenting with nosebleeds. The history is provided by the patient and a significant other. No language interpreter was used.  Epistaxis [name] [name] is a 39 y.o. black male with a history of asthma, schizophrenia, and bipolar disorder who presents to the ED for epistaxis that began at 11:00 today and resolved at 11:30.  He noticed several clots but was able to stop the bleeding with pressure. He also states he passed out 4 times in the past week and was seen at [facility] and a head CT was done.  He does not know the results of the CT but they referred him to a neurologist. He has not seen the neurologist yet but called today when he had another episode of passing out for a few seconds while seated at group therapy today and they advised him to come to the ED. He denies any chest pain, nausea, vomiting, or hematochezia. Girlfriend says he has been manic for the last 4 days. He says he does not take his medications for schizophrenia and bipolar depression.  Past Medical History  Diagnosis Date  . Asthma   . Homeless   . Schizophrenia    History reviewed. No pertinent past surgical history. History reviewed. No pertinent family history. History  Substance Use Topics  . Smoking status: Current Every Day Smoker  . Smokeless tobacco: Not on file  . Alcohol Use: Yes    Review of Systems  Constitutional: Positive for chills.  HENT: Positive for nosebleeds.   All other systems reviewed and are negative.     Allergies  Peach seed and Sudafed  Home Medications   Prior to Admission medications   Medication Sig Start Date End Date Taking? Authorizing Provider  albuterol (PROVENTIL  HFA;VENTOLIN HFA) 108 (90 BASE) MCG/ACT inhaler Inhale 2 puffs into the lungs every 6 (six) hours as needed. For wheezing   Yes Historical Provider, MD  benztropine (COGENTIN) 1 MG tablet Take 1 mg by mouth 2 (two) times daily.   Yes Historical Provider, MD  busPIRone (BUSPAR) 5 MG tablet Take 5 mg by mouth 3 (three) times daily.   Yes Historical Provider, MD  divalproex (DEPAKOTE ER) 500 MG 24 hr tablet Take 2 tablets (1,000 mg total) by mouth daily. 04/03/12  Yes Leata Mouse, MD  haloperidol decanoate (HALDOL DECANOATE) 100 MG/ML injection Inject 175 mg into the muscle every 14 (fourteen) days.    Yes Historical Provider, MD   BP 109/71 mmHg  Pulse 78  Temp(Src) 98.4 F (36.9 C) (Oral)  Resp 19  SpO2 99% Physical Exam  Constitutional: He is oriented to person, place, and time. He appears well-developed and well-nourished.  HENT:  Head: Normocephalic and atraumatic.  Right Ear: External ear normal.  Left Ear: External ear normal.  Mouth/Throat: Oropharynx is clear and moist.  There is no active bleeding from the nose.  There is some dry blood in the left nare.   Eyes: Conjunctivae are normal.  Neck: Normal range of motion. Neck supple.  Cardiovascular: Normal rate, regular rhythm and normal heart sounds.   Pulmonary/Chest: Effort normal and breath sounds normal.  Abdominal: Soft. There is no tenderness.  Musculoskeletal: Normal range of motion.  Neurological: He is  alert and oriented to person, place, and time.  Skin: Skin is warm and dry.  Psychiatric:  Patient is cooprative.   Nursing note and vitals reviewed.   ED Course  Procedures (including critical care time) Labs Review Labs Reviewed  I-STAT CHEM 8, ED - Abnormal; Notable for the following:    Calcium, Ion 1.27 (*)    Hemoglobin 18.0 (*)    HCT 53.0 (*)    All other components within normal limits  CBC  URINALYSIS, ROUTINE W REFLEX MICROSCOPIC    Imaging Review No results found.   EKG  Interpretation None      MDM   Final diagnoses:  Epistaxis  Patient has a known history of schizophrenia and is non compliant with his medications according to his girlfriend and the patient.  I do not want to repeat the head CT on this patient since he had one two days ago at [facility].  The secretary was able to obtain a copy of CT head w/o contrast that was done on 04/29/14 which showed no acute intracranial abnormality, no mass lesion, no acute hemorrhage, no acute infarct. Patient told nursing staff he had a brain tumor which was not identified on the CT.  The patient's nose bleed has resolved on its own. There is no active bleeding. Patients vitals are unremarkable and he is afebrile. Filed Vitals:   05/01/14 1705  BP: 109/71  Pulse: 78  Temp: 98.4 F (36.9 C)  Resp: 19    I checked his electrolytes, UA, and Hgb due to weakness and all labs were unremarkable. I gave him an ibuprofen while in the ED and he was sent home with f/u to his PCP. I also told him to keep his f/u appointment with his neurologist.     [name], PA-C 05/02/14 [id number]  [name], MD 05/05/14 [phone]

## 2014-07-14 ENCOUNTER — Emergency Department (HOSPITAL_COMMUNITY)
Admission: EM | Admit: 2014-07-14 | Discharge: 2014-07-14 | Disposition: A | Discharge status: Home / Self Care | Payer: Medicaid Other | Attending: Emergency Medicine | Admitting: Emergency Medicine

## 2014-07-14 ENCOUNTER — Encounter (HOSPITAL_COMMUNITY): Payer: Self-pay | Admitting: *Deleted

## 2014-07-14 ENCOUNTER — Ambulatory Visit (HOSPITAL_COMMUNITY)
Admission: RE | Admit: 2014-07-14 | Discharge: 2014-07-14 | Disposition: A | Discharge status: Home / Self Care | Payer: Medicaid Other | Attending: Psychiatry | Admitting: Psychiatry

## 2014-07-14 DIAGNOSIS — Z590 Homelessness: Secondary | ICD-10-CM | POA: Insufficient documentation

## 2014-07-14 DIAGNOSIS — F329 Major depressive disorder, single episode, unspecified: Secondary | ICD-10-CM | POA: Diagnosis not present

## 2014-07-14 DIAGNOSIS — Z720 Tobacco use: Secondary | ICD-10-CM | POA: Insufficient documentation

## 2014-07-14 DIAGNOSIS — Z79899 Other long term (current) drug therapy: Secondary | ICD-10-CM | POA: Diagnosis not present

## 2014-07-14 DIAGNOSIS — F32A Depression, unspecified: Secondary | ICD-10-CM

## 2014-07-14 DIAGNOSIS — F419 Anxiety disorder, unspecified: Secondary | ICD-10-CM | POA: Diagnosis not present

## 2014-07-14 DIAGNOSIS — J45909 Unspecified asthma, uncomplicated: Secondary | ICD-10-CM | POA: Diagnosis not present

## 2014-07-14 DIAGNOSIS — R45851 Suicidal ideations: Secondary | ICD-10-CM | POA: Diagnosis present

## 2014-07-14 LAB — RAPID URINE DRUG SCREEN, HOSP PERFORMED
Amphetamines: NOT DETECTED
Barbiturates: NOT DETECTED
Benzodiazepines: NOT DETECTED
Cocaine: NOT DETECTED
Opiates: NOT DETECTED
Tetrahydrocannabinol: NOT DETECTED

## 2014-07-14 MED ORDER — ZIPRASIDONE MESYLATE 20 MG IM SOLR
20.0000 mg | Freq: Once | INTRAMUSCULAR | Status: DC
  Filled 2014-07-14: qty 20

## 2014-07-14 MED ORDER — LORAZEPAM 2 MG/ML IJ SOLN
2.0000 mg | Freq: Once | INTRAMUSCULAR | Status: DC
  Filled 2014-07-14: qty 1

## 2014-07-14 MED ORDER — DIPHENHYDRAMINE HCL 50 MG/ML IJ SOLN
50.0000 mg | Freq: Once | INTRAMUSCULAR | Status: DC
  Filled 2014-07-14: qty 1

## 2014-07-14 MED ORDER — STERILE WATER FOR INJECTION IJ SOLN
INTRAMUSCULAR | Status: AC
  Filled 2014-07-14: qty 10

## 2014-07-14 MED ORDER — LORAZEPAM 1 MG PO TABS: 2.0000 mg | ORAL_TABLET | Freq: Once | ORAL | Status: DC

## 2014-07-14 NOTE — [facility] Counselor (Signed)
Per Inetta Fermoina [facility]
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
## 2014-07-14 NOTE — ED Notes (Signed)
Patient stated he did not want to stay and denied having suicidal ideation. MD Robley Friesougherty notified, MD spoke with patient. Pt signed no-harm contract before leaving.

## 2014-07-14 NOTE — ED Notes (Addendum)
Pt brought in by [employer]. [facility]. [employer] state the patient told them he "would [facility] be [facility] Pt states he is depressed and suicidal, states he would "overdose and go in the shower". Pt denies HI. When asked what is bothering the pt, pt handed this nurse a list of his problems. List appears to be flight of ideas including "cowboy", "guy step on shoes", and "library 1-year ban". List is in pt's chart.

## 2014-07-14 NOTE — ED Notes (Signed)
Patient refused for me to collect labs.  I made nurse aware.
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
## 2014-07-14 NOTE — ED Notes (Addendum)
Pt belongings in locker 31 °

## 2014-07-14 NOTE — Progress Notes (Signed)
CSW consulted for homelessness and need for psychiatric services. At this time patient recommended for inpatient treatment. However, if patient is able to stabilize prior to being admitted to a behavioral health hosptial, patient may be ellgible for housing first actt team services. CSW to complete referral once patient psychiatrically stable.   CSW will also assess further for further resources once patient evaluated further by psychiatric treatment team.   Olga CoasterKristen Pauline Trainer, LCSW  Clinical Social Work  [facility] [phone]

## 2014-07-14 NOTE — ED Notes (Signed)
Bed: WA31 Expected date:  Expected time:  Means of arrival:  Comments: 

## 2014-07-14 NOTE — Progress Notes (Signed)
ED and SW discussed pt after charge ED RN [facility].  Noted covering [employer] working on services for pt for staff and pt safety Pt now has pcp [facility] and medicaid [place] access coverage EPIC updated

## 2014-07-14 NOTE — Discharge Instructions (Signed)
Suicidal Feelings, How to Help Yourself °Everyone feels sad or unhappy at times, but depressing thoughts and feelings of hopelessness can lead to thoughts of suicide. It can seem as if life is too tough to handle. If you feel as though you have reached the point where suicide is the only answer, it is time to let someone know immediately.  °HOW TO COPE AND PREVENT SUICIDE °· Let family, friends, teachers, or counselors know. Get help. Try not to isolate yourself from those who care about you. Even though you may not feel sociable, talk with someone every day. It is best if it is face-to-face. Remember, they will want to help you. °· Eat a regularly spaced and well-balanced diet. °· Get plenty of rest. °· Avoid alcohol and drugs because they will only make you feel worse and may also lower your inhibitions. Remove them from the home. If you are thinking of taking an overdose of your prescribed medicines, give your medicines to someone who can give them to you one day at a time. If you are on antidepressants, let your caregiver know of your feelings so he or she can provide a safer medicine, if that is a concern. °· Remove weapons or poisons from your home. °· Try to stick to routines. Follow a schedule and remind yourself that you have to keep that schedule every day. °· Set some realistic goals and achieve them. Make a list and cross things off as you go. Accomplishments give a sense of worth. Wait until you are feeling better before doing things you find difficult or unpleasant to do. °· If you are able, try to start exercising. Even half-hour periods of exercise each day will make you feel better. Getting out in the sun or into nature helps you recover from depression faster. If you have a favorite place to walk, take advantage of that. °· Increase safe activities that have always given you pleasure. This may include playing your favorite music, reading a good book, painting a picture, or playing your favorite  instrument. Do whatever takes your mind off your depression. °· Keep your living space well-lighted. °GET HELP °Contact a suicide hotline, crisis center, or local suicide prevention center for help right away. Local centers may include a hospital, clinic, community service organization, social service provider, or health department. °· Call your local emergency services (911 in the [place]). °· Call a suicide hotline: °¨ 1-800-273-TALK (1-800-273-8255) in the [place]. °¨ 1-800-SUICIDE (1-800-784-2433) in the [place]. °¨ 1-888-628-9454 in the [place] for Spanish-speaking counselors. °¨ 1-800-799-4TTY (1-800-799-4889) in the [place] for TTY users. °· Visit the following websites for information and help: °¨ National Suicide Prevention Lifeline: www.suicidepreventionlifeline.org °¨ Hopeline: www.hopeline.com °¨ American Foundation for Suicide Prevention: www.afsp.org °· For lesbian, gay, bisexual, transgender, or questioning youth, contact The Trevor Project: °¨ 1-866-4-U-TREVOR (1-866-488-7386) in the [place]. °¨ www.thetrevorproject.org °· In [place], treatment resources are listed in each province with listings available under [employer] or similar titles. Another source for Crisis Centres by Province is located at http://www.suicideprevention.ca/in-crisis-now/find-a-crisis-centre-now/crisis-centres °Document Released: 08/13/2002 Document Revised: 05/01/2011 Document Reviewed: 06/03/2013 °ExitCare® Patient Information ©2015 ExitCare, LLC. This information is not intended to replace advice given to you by your health care provider. Make sure you discuss any questions you have with your health care provider. ° °

## 2014-07-14 NOTE — BHH Counselor (Signed)
[facility]  Per Renata Capriceonrad, NP - patient meets criteria for inpatient hospitalization.  Writer [facility] that the patient would be coming to the ED for medical clearance.

## 2014-08-04 ENCOUNTER — Encounter (HOSPITAL_COMMUNITY): Payer: Self-pay | Admitting: *Deleted

## 2014-08-04 ENCOUNTER — Emergency Department (HOSPITAL_COMMUNITY)
Admission: EM | Admit: 2014-08-04 | Discharge: 2014-08-04 | Discharge status: Against Medical Advice | Payer: Medicaid Other | Attending: Emergency Medicine | Admitting: Emergency Medicine

## 2014-08-04 DIAGNOSIS — Y939 Activity, unspecified: Secondary | ICD-10-CM | POA: Insufficient documentation

## 2014-08-04 DIAGNOSIS — S0990XA Unspecified injury of head, initial encounter: Secondary | ICD-10-CM | POA: Insufficient documentation

## 2014-08-04 DIAGNOSIS — Y999 Unspecified external cause status: Secondary | ICD-10-CM | POA: Diagnosis not present

## 2014-08-04 DIAGNOSIS — J45909 Unspecified asthma, uncomplicated: Secondary | ICD-10-CM | POA: Insufficient documentation

## 2014-08-04 DIAGNOSIS — Y929 Unspecified place or not applicable: Secondary | ICD-10-CM | POA: Insufficient documentation

## 2014-08-04 DIAGNOSIS — Z720 Tobacco use: Secondary | ICD-10-CM | POA: Insufficient documentation
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
## 2014-08-04 NOTE — ED Notes (Addendum)
Pt walked out of room stating he was leaving. Pt asked for something to drink, RN gave pt something to drink,  Pt left

## 2014-09-02 ENCOUNTER — Emergency Department (HOSPITAL_COMMUNITY)
Admission: EM | Admit: 2014-09-02 | Discharge: 2014-09-02 | Disposition: A | Discharge status: Home / Self Care | Payer: Medicaid Other | Attending: Emergency Medicine | Admitting: Emergency Medicine

## 2014-09-02 ENCOUNTER — Emergency Department (HOSPITAL_COMMUNITY): Payer: Medicaid Other

## 2014-09-02 ENCOUNTER — Encounter (HOSPITAL_COMMUNITY): Payer: Self-pay | Admitting: *Deleted

## 2014-09-02 DIAGNOSIS — R630 Anorexia: Secondary | ICD-10-CM | POA: Insufficient documentation

## 2014-09-02 DIAGNOSIS — R200 Anesthesia of skin: Secondary | ICD-10-CM | POA: Insufficient documentation

## 2014-09-02 DIAGNOSIS — Z720 Tobacco use: Secondary | ICD-10-CM | POA: Diagnosis not present

## 2014-09-02 DIAGNOSIS — R079 Chest pain, unspecified: Secondary | ICD-10-CM | POA: Diagnosis present

## 2014-09-02 DIAGNOSIS — F1610 Hallucinogen abuse, uncomplicated: Secondary | ICD-10-CM | POA: Diagnosis not present

## 2014-09-02 DIAGNOSIS — R10817 Generalized abdominal tenderness: Secondary | ICD-10-CM | POA: Insufficient documentation

## 2014-09-02 DIAGNOSIS — J45909 Unspecified asthma, uncomplicated: Secondary | ICD-10-CM | POA: Insufficient documentation

## 2014-09-02 DIAGNOSIS — Z79899 Other long term (current) drug therapy: Secondary | ICD-10-CM | POA: Insufficient documentation

## 2014-09-02 DIAGNOSIS — E860 Dehydration: Secondary | ICD-10-CM | POA: Insufficient documentation

## 2014-09-02 DIAGNOSIS — Z590 Homelessness: Secondary | ICD-10-CM | POA: Diagnosis not present

## 2014-09-02 DIAGNOSIS — F209 Schizophrenia, unspecified: Secondary | ICD-10-CM | POA: Insufficient documentation

## 2014-09-02 LAB — BASIC METABOLIC PANEL
Anion gap: 10 (ref 5–15)
BUN: 19 mg/dL (ref 6–20)
CO2: 23 mmol/L (ref 22–32)
Calcium: 9.7 mg/dL (ref 8.9–10.3)
Chloride: 108 mmol/L (ref 101–111)
Creatinine, Ser: 1.34 mg/dL — ABNORMAL HIGH (ref 0.61–1.24)
GFR calc Af Amer: >60 mL/min (ref >60)
GFR calc non Af Amer: >60 mL/min (ref >60)
Glucose, Bld: 80 mg/dL (ref 65–99)
Potassium: 3.9 mmol/L (ref 3.5–5.1)
Sodium: 141 mmol/L (ref 135–145)

## 2014-09-02 LAB — I-STAT TROPONIN, ED: Troponin i, poc: 0 ng/mL (ref 0.00–0.08)

## 2014-09-02 LAB — CBC
HCT: 46.6 % (ref 39.0–52.0)
Hemoglobin: 16 g/dL (ref 13.0–17.0)
MCH: 31.7 pg (ref 26.0–34.0)
MCHC: 34.3 g/dL (ref 30.0–36.0)
MCV: 92.5 fL (ref 78.0–100.0)
Platelets: 168 10*3/uL (ref 150–400)
RBC: 5.04 MIL/uL (ref 4.22–5.81)
RDW: 12.8 % (ref 11.5–15.5)
WBC: 7.7 10*3/uL (ref 4.0–10.5)

## 2014-09-02 MED ORDER — GI COCKTAIL ~~LOC~~
30.0000 mL | Freq: Once | ORAL | Status: DC
  Filled 2014-09-02: qty 30

## 2014-09-02 NOTE — ED Notes (Signed)
Micheline Mazeocherty, MD at bedside discussing plan of care, pt denies SI at this time, pt states, "I was just emotional. I [facility] with [facility] & I am going to see him tomorrow. If I start feeling like I want to hurt myself I have a friend that will bring me in." MD verbalizes that plan is to discharge the pt

## 2014-09-02 NOTE — ED Notes (Addendum)
Patient transported to X-ray per Enid DerryEthan

## 2014-09-02 NOTE — ED Notes (Signed)
Pt tearful, pt states, "I was upset & my girlfriend does not want to have anything to do with me. I took 12 mollys. I want to hurt myself. I don't want to live anymore. [facility]James [facility] to talk to him." pt [facility], pt speaking with [facility], pt calm & cooperative at this time
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
## 2014-09-02 NOTE — ED Notes (Signed)
Notified Staffing for sitter 

## 2014-09-02 NOTE — Discharge Instructions (Signed)
Ecstasy Abuse Ecstasy has been around for more than 80 years but recently achieved popularity as a "recreational drug". Ecstasy is taken by oral ingestion. Although teen use of most drugs has declined in the past 10 years, the use of ecstasy or "e" has gone up. The use of ecstasy affects the brain by increasing the release of serotonin in the brain. This is the "feel good" neurotransmitter between nerves. Studies done on people who have used ecstasy show poorer performance on memory tests, and computer images show that there are fewer serotonin receptors in ecstasy users, which may have irreversible consequences. Much of the ecstasy on the market is impure. A number of people taking this medication die of adulterants (contaminants) purposely added which raise the body temperature to a fatal level (hyperthermia). The use of ecstasy is also followed by a "crash". The crash is associated with depressed feelings, which cause a craving for the drug to regain the high or feeling of normalcy. WHEN IS DRUG USE A PROBLEM? Anytime drug use is interfering with normal living activities it has become abuse. This includes problems with family and friends. Psychological dependence has developed when your mind tells you that the drug is needed. Street drugs are filled with impurities which can cause serious problems or even death. If you have signs of chemical dependency or addiction, you should seek help immediately. There are many resources available to help combat alcoholism and chemical dependency. SOME OF THE SIGNS OF CHEMICAL DEPENDENCY ARE LISTED BELOW:  You have been told by friends or family that drugs have become a problem.  Changes in mood or behavior when using drugs.  Having blackouts (not remembering what you do while using).  Lying about use or amounts of drugs (chemicals) used.  You feel that you need the drug to "get going" or function normally.  You suffer in work performance or school because of  drug use.  Using drugs makes you ill but you continue to use anyway.  You feel you need drugs to relate to people or feel comfortable in social situations.  You use drugs to escape from problems. If you answered "yes" to any of the above signs of chemical dependency it indicates you have a problem. The longer the use of drugs continues, the greater the problems will become. If there is a family history of drug or alcohol abuse it is even more important not to experiment with drugs. Drug dependence definitely follows patterns of inheritance. HOW TO STAY DRUG FREE ONCE YOU HAVE QUIT USING  Develop healthy activities and form friends who do not use drugs.  Stay away from the drug scene.  Continue to utilize any and all outside resources to help you avoid continued or future drug [place] and dependency.  Develop a strong support system of friends or family who will help you avoid future drug use. Document Released: 02/04/2000 Document Revised: 05/01/2011 Document Reviewed: 04/25/2013 [facility] Patient Information 2015 [place], [place]. This information is not intended to replace advice given to you by your health care provider. Make sure you discuss any questions you have with your health care provider.   Dehydration, Adult Dehydration is when you lose more fluids from the body than you take in. Vital organs like the kidneys, brain, and heart cannot function without a proper amount of fluids and salt. Any loss of fluids from the body can cause dehydration.  CAUSES   Vomiting.  Diarrhea.  Excessive sweating.  Excessive urine output.  Fever. SYMPTOMS  Mild  dehydration  Thirst.  Dry lips.  Slightly dry mouth. Moderate dehydration  Very dry mouth.  Sunken eyes.  Skin does not bounce back quickly when lightly pinched and released.  Dark urine and decreased urine production.  Decreased tear production.  Headache. Severe dehydration  Very dry mouth.  Extreme thirst.  Rapid,  weak pulse (more than 100 beats per minute at rest).  Cold hands and feet.  Not able to sweat in spite of heat and temperature.  Rapid breathing.  Blue lips.  Confusion and lethargy.  Difficulty being awakened.  Minimal urine production.  No tears. DIAGNOSIS  Your caregiver will diagnose dehydration based on your symptoms and your exam. Blood and urine tests will help confirm the diagnosis. The diagnostic evaluation should also identify the cause of dehydration. TREATMENT  Treatment of mild or moderate dehydration can often be done at home by increasing the amount of fluids that you drink. It is best to drink small amounts of fluid more often. Drinking too much at one time can make vomiting worse. Refer to the home care instructions below. Severe dehydration needs to be treated at the hospital where you will probably be given intravenous (IV) fluids that contain water and electrolytes. HOME CARE INSTRUCTIONS   Ask your caregiver about specific rehydration instructions.  Drink enough fluids to keep your urine clear or pale yellow.  Drink small amounts frequently if you have nausea and vomiting.  Eat as you normally do.  Avoid:  Foods or drinks high in sugar.  Carbonated drinks.  Juice.  Extremely hot or cold fluids.  Drinks with caffeine.  Fatty, greasy foods.  Alcohol.  Tobacco.  Overeating.  Gelatin desserts.  Wash your hands well to avoid spreading bacteria and viruses.  Only take over-the-counter or prescription medicines for pain, discomfort, or fever as directed by your caregiver.  Ask your caregiver if you should continue all prescribed and over-the-counter medicines.  Keep all follow-up appointments with your caregiver. SEEK MEDICAL CARE IF:  You have abdominal pain and it increases or stays in one area (localizes).  You have a rash, stiff neck, or severe headache.  You are irritable, sleepy, or difficult to awaken.  You are weak, dizzy, or  extremely thirsty. SEEK IMMEDIATE MEDICAL CARE IF:   You are unable to keep fluids down or you get worse despite treatment.  You have frequent episodes of vomiting or diarrhea.  You have blood or green matter (bile) in your vomit.  You have blood in your stool or your stool looks black and tarry.  You have not urinated in 6 to 8 hours, or you have only urinated a small amount of very dark urine.  You have a fever.  You faint. MAKE SURE YOU:   Understand these instructions.  Will watch your condition.  Will get help right away if you are not doing well or get worse. Document Released: 02/06/2005 Document Revised: 05/01/2011 Document Reviewed: 09/26/2010 [facility] Patient Information 2015 [place]. This information is not intended to replace advice given to you by your health care provider. Make sure you discuss any questions you have with your health care provider.

## 2014-09-02 NOTE — ED Notes (Signed)
Patient transported to X-ray 

## 2014-09-02 NOTE — ED Notes (Signed)
EKG exported, would not print. Dr. Micheline Mazeocherty reviewed EKG on monitor.
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
## 2014-09-06 ENCOUNTER — Emergency Department (HOSPITAL_COMMUNITY)
Admission: EM | Admit: 2014-09-06 | Discharge: 2014-09-08 | Disposition: A | Discharge status: Home / Self Care | Payer: Medicaid Other | Attending: Emergency Medicine | Admitting: Emergency Medicine

## 2014-09-06 ENCOUNTER — Encounter (HOSPITAL_COMMUNITY): Payer: Self-pay | Admitting: *Deleted

## 2014-09-06 DIAGNOSIS — Z720 Tobacco use: Secondary | ICD-10-CM | POA: Diagnosis not present

## 2014-09-06 DIAGNOSIS — F1310 Sedative, hypnotic or anxiolytic abuse, uncomplicated: Secondary | ICD-10-CM | POA: Insufficient documentation

## 2014-09-06 DIAGNOSIS — J45909 Unspecified asthma, uncomplicated: Secondary | ICD-10-CM | POA: Insufficient documentation

## 2014-09-06 DIAGNOSIS — Z950 Presence of cardiac pacemaker: Secondary | ICD-10-CM | POA: Insufficient documentation

## 2014-09-06 DIAGNOSIS — R45850 Homicidal ideations: Secondary | ICD-10-CM | POA: Diagnosis present

## 2014-09-06 DIAGNOSIS — R440 Auditory hallucinations: Secondary | ICD-10-CM

## 2014-09-06 LAB — CBC
HCT: 45 % (ref 39.0–52.0)
Hemoglobin: 15.3 g/dL (ref 13.0–17.0)
MCH: 31.5 pg (ref 26.0–34.0)
MCHC: 34 g/dL (ref 30.0–36.0)
MCV: 92.8 fL (ref 78.0–100.0)
Platelets: 177 10*3/uL (ref 150–400)
RBC: 4.85 MIL/uL (ref 4.22–5.81)
RDW: 12.3 % (ref 11.5–15.5)
WBC: 7.8 10*3/uL (ref 4.0–10.5)

## 2014-09-06 LAB — RAPID URINE DRUG SCREEN, HOSP PERFORMED
Amphetamines: NOT DETECTED
Barbiturates: NOT DETECTED
Benzodiazepines: NOT DETECTED
Cocaine: NOT DETECTED
Opiates: NOT DETECTED
Tetrahydrocannabinol: POSITIVE — AB

## 2014-09-06 LAB — COMPREHENSIVE METABOLIC PANEL
ALT: 25 U/L (ref 17–63)
AST: 25 U/L (ref 15–41)
Albumin: 3.8 g/dL (ref 3.5–5.0)
Alkaline Phosphatase: 72 U/L (ref 38–126)
Anion gap: 2 — ABNORMAL LOW (ref 5–15)
BUN: 13 mg/dL (ref 6–20)
CO2: 30 mmol/L (ref 22–32)
Calcium: 9.2 mg/dL (ref 8.9–10.3)
Chloride: 105 mmol/L (ref 101–111)
Creatinine, Ser: 1.12 mg/dL (ref 0.61–1.24)
GFR calc Af Amer: >60 mL/min (ref >60)
GFR calc non Af Amer: >60 mL/min (ref >60)
Glucose, Bld: 90 mg/dL (ref 65–99)
Potassium: 3.8 mmol/L (ref 3.5–5.1)
Sodium: 137 mmol/L (ref 135–145)
Total Bilirubin: 1 mg/dL (ref 0.3–1.2)
Total Protein: 6.8 g/dL (ref 6.5–8.1)

## 2014-09-06 LAB — ETHANOL: Alcohol, Ethyl (B): <5 mg/dL (ref <5)

## 2014-09-06 LAB — ACETAMINOPHEN LEVEL: Acetaminophen (Tylenol), Serum: <10 ug/mL — ABNORMAL LOW (ref 10–30)

## 2014-09-06 LAB — SALICYLATE LEVEL: Salicylate Lvl: <4 mg/dL (ref 2.8–30.0)

## 2014-09-06 MED ORDER — ACETAMINOPHEN 325 MG PO TABS: 650.0000 mg | ORAL_TABLET | ORAL | Status: DC | PRN

## 2014-09-06 MED ORDER — ZIPRASIDONE MESYLATE 20 MG IM SOLR
10.0000 mg | Freq: Once | INTRAMUSCULAR | Status: AC
  Administered 2014-09-06: 10 mg via INTRAMUSCULAR
  Filled 2014-09-06: qty 20

## 2014-09-06 MED ORDER — HALOPERIDOL DECANOATE 100 MG/ML IM SOLN: 175.0000 mg | INTRAMUSCULAR | Status: DC

## 2014-09-06 MED ORDER — LAMOTRIGINE 25 MG PO TABS
25.0000 mg | ORAL_TABLET | Freq: Two times a day (BID) | ORAL | Status: DC
  Administered 2014-09-06 – 2014-09-08 (×4): 25 mg via ORAL
  Filled 2014-09-06 (×5): qty 1

## 2014-09-06 MED ORDER — STERILE WATER FOR INJECTION IJ SOLN
INTRAMUSCULAR | Status: AC
  Administered 2014-09-06: 1.2 mL via INTRAMUSCULAR
  Filled 2014-09-06: qty 10

## 2014-09-06 MED ORDER — STERILE WATER FOR INJECTION IJ SOLN
1.0000 mL | Freq: Once | INTRAMUSCULAR | Status: AC
  Administered 2014-09-06: 1.2 mL via INTRAMUSCULAR

## 2014-09-06 MED ORDER — ALUM & MAG HYDROXIDE-SIMETH 200-200-20 MG/5ML PO SUSP: 30.0000 mL | ORAL | Status: DC | PRN

## 2014-09-06 MED ORDER — ALBUTEROL SULFATE HFA 108 (90 BASE) MCG/ACT IN AERS: 2.0000 | INHALATION_SPRAY | Freq: Four times a day (QID) | RESPIRATORY_TRACT | Status: DC | PRN

## 2014-09-06 MED ORDER — NICOTINE 14 MG/24HR TD PT24
14.0000 mg | MEDICATED_PATCH | Freq: Once | TRANSDERMAL | Status: AC
  Administered 2014-09-06: 14 mg via TRANSDERMAL
  Filled 2014-09-06: qty 1

## 2014-09-06 MED ORDER — ZOLPIDEM TARTRATE 5 MG PO TABS: 5.0000 mg | ORAL_TABLET | Freq: Every evening | ORAL | Status: DC | PRN

## 2014-09-06 MED ORDER — PANTOPRAZOLE SODIUM 40 MG PO TBEC: 40.0000 mg | DELAYED_RELEASE_TABLET | Freq: Every day | ORAL | Status: DC

## 2014-09-06 MED ORDER — ONDANSETRON HCL 4 MG PO TABS: 4.0000 mg | ORAL_TABLET | Freq: Three times a day (TID) | ORAL | Status: DC | PRN

## 2014-09-06 MED ORDER — LORAZEPAM 1 MG PO TABS: 1.0000 mg | ORAL_TABLET | Freq: Three times a day (TID) | ORAL | Status: DC | PRN

## 2014-09-06 MED ORDER — IBUPROFEN 400 MG PO TABS: 600.0000 mg | ORAL_TABLET | Freq: Three times a day (TID) | ORAL | Status: DC | PRN

## 2014-09-06 NOTE — ED Notes (Signed)
Staffing office notified and chg rn
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
## 2014-09-06 NOTE — ED Notes (Signed)
Staffing office states that there is someone coming to sit with the pt.

## 2014-09-06 NOTE — [facility] Counselor (Signed)
This Clinical research associatewriter faxed out supporting documentation for placement of this patient to the following facilities:  [facility]  [place] [place], [place] O[place] Counselor

## 2014-09-06 NOTE — ED Notes (Signed)
wanded by security 

## 2014-09-06 NOTE — ED Notes (Signed)
Pt refusing to eat meal tray ordered and has thrown it away, pt requesting Cheeseburger and fries. Ordered from service response.

## 2014-09-06 NOTE — Progress Notes (Signed)
Per [facility]Bar[facility], pt on [facility].  Ilean SkillMeghan Belisa Eichholz, MSW, [facility] Clinical Social Work, Disposition  09/06/2014 [phone]
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
## 2014-09-06 NOTE — ED Notes (Signed)
Pt on phone calling his mother. [employer] Officer arrived to ED and advised pt his mother was concerned as to his location and status.

## 2014-09-06 NOTE — ED Notes (Addendum)
Sitter at bedside, pt aware of urine sample needed, pt unable to go at this time.

## 2014-09-06 NOTE — ED Notes (Signed)
Gavin PoundDeborah from [facility] called to say patient is declined due to multiple assault charges. Pt. Is too violent for facility

## 2014-09-06 NOTE — ED Notes (Signed)
The pt is saying that he is suicidal and he wants to kiull his sister.  He was just here on the 13 for the same

## 2014-09-06 NOTE — ED Notes (Signed)
PT was found to have his cell phone in the room. Pt advised of the policy. PTs cell phone taken to security. Pt re wanded by security.

## 2014-09-06 NOTE — ED Notes (Signed)
Pt resting comfortably at this time.

## 2014-09-06 NOTE — ED Provider Notes (Signed)
CSN: [id number]     Arrival date & time 09/06/14  0002 History  This chart was scribed for Dione Boozeavid Dontrelle Mazon, MD by Abel PrestoKara Demonbreun, ED Scribe. This patient was seen in room A05C/A05C and the patient's care was started at 1:17 AM.     Chief Complaint  Patient presents with  . Psychiatric Evaluation     The history is provided by the patient. No language interpreter was used.   HPI Comments: [name] [name] is a 39 y.o. male who presents to the Emergency Department for psychiatric evaluation. Pt states he tried to get in contact with his mom and sister. He states his sister was not answering the phone. Pt states he wants to kill her by torture. He reports homicidal thought prior tonight. He notes auditory hallucinations. He denies drug use today, but reports last use was 1 week ago. He states he used MDMA. Pt was last seen in ED on 09/02/14. Pt denies suicidal ideation.   Past Medical History  Diagnosis Date  . Asthma   . Homeless   . Schizophrenia    History reviewed. No pertinent past surgical history. No family history on file. History  Substance Use Topics  . Smoking status: Current Every Day Smoker  . Smokeless tobacco: Not on file  . Alcohol Use: Yes    Review of Systems  Psychiatric/Behavioral: Positive for agitation. Negative for suicidal ideas.       Homicidal ideation  All other systems reviewed and are negative.     Allergies  Peach seed and Sudafed  Home Medications   Prior to Admission medications   Medication Sig Start Date End Date Taking? Authorizing Provider  divalproex (DEPAKOTE ER) 500 MG 24 hr tablet Take 2 tablets (1,000 mg total) by mouth daily. Patient not taking: Reported on 08/04/2014 04/03/12   Leata MouseJanardhana Jonnalagadda, MD  haloperidol decanoate (HALDOL DECANOATE) 100 MG/ML injection Inject 175 mg into the muscle every 14 (fourteen) days.     Historical Provider, MD   BP 129/79 mmHg  Pulse 90  Temp(Src) 98.2 F (36.8 C) (Oral)  Resp 16  SpO2  94% Physical Exam  Constitutional: He is oriented to person, place, and time. He appears well-developed and well-nourished.  HENT:  Head: Normocephalic.  Eyes: Conjunctivae are normal. Pupils are equal, round, and reactive to light.  Neck: Normal range of motion. Neck supple. No JVD present.  Cardiovascular: Normal rate, regular rhythm and normal heart sounds.   No murmur heard. Pulmonary/Chest: Effort normal and breath sounds normal. He has no wheezes. He has no rales. He exhibits no tenderness.  Abdominal: Soft. Bowel sounds are normal. He exhibits no distension and no mass. There is no tenderness.  Musculoskeletal: Normal range of motion. He exhibits no edema.  Lymphadenopathy:    He has no cervical adenopathy.  Neurological: He is alert and oriented to person, place, and time. No cranial nerve deficit. He exhibits normal muscle tone. Coordination normal.  Skin: Skin is warm and dry. No rash noted.  Psychiatric: His affect is angry. He expresses homicidal ideation. He expresses no suicidal ideation.  Nursing note and vitals reviewed.   ED Course  Procedures (including critical care time) DIAGNOSTIC STUDIES: Oxygen Saturation is 94% on room air, adequate by my interpretation.    COORDINATION OF CARE: 1:24 AM Discussed treatment plan with patient at beside, the patient agrees with the plan and has no further questions at this time.   Labs Review Results for orders placed or performed during the hospital encounter  of 09/06/14  Comprehensive metabolic panel  Result Value Ref Range   Sodium 137 135 - 145 mmol/L   Potassium 3.8 3.5 - 5.1 mmol/L   Chloride 105 101 - 111 mmol/L   CO2 30 22 - 32 mmol/L   Glucose, Bld 90 65 - 99 mg/dL   BUN 13 6 - 20 mg/dL   Creatinine, Ser 1.61 0.61 - 1.24 mg/dL   Calcium 9.2 8.9 - 09.6 mg/dL   Total Protein 6.8 6.5 - 8.1 g/dL   Albumin 3.8 3.5 - 5.0 g/dL   AST 25 15 - 41 U/L   ALT 25 17 - 63 U/L   Alkaline Phosphatase 72 38 - 126 U/L   Total  Bilirubin 1.0 0.3 - 1.2 mg/dL   GFR calc non Af Amer >60 >60 mL/min   GFR calc Af Amer >60 >60 mL/min   Anion gap 2 (L) 5 - 15  Ethanol (ETOH)  Result Value Ref Range   Alcohol, Ethyl (B) <5 <5 mg/dL  Salicylate level  Result Value Ref Range   Salicylate Lvl <4.0 2.8 - 30.0 mg/dL  Acetaminophen level  Result Value Ref Range   Acetaminophen (Tylenol), Serum <10 (L) 10 - 30 ug/mL  CBC  Result Value Ref Range   WBC 7.8 4.0 - 10.5 K/uL   RBC 4.85 4.22 - 5.81 MIL/uL   Hemoglobin 15.3 13.0 - 17.0 g/dL   HCT 04.5 40.9 - 81.1 %   MCV 92.8 78.0 - 100.0 fL   MCH 31.5 26.0 - 34.0 pg   MCHC 34.0 30.0 - 36.0 g/dL   RDW 91.4 78.2 - 95.6 %   Platelets 177 150 - 400 K/uL    MDM   Final diagnoses:  Homicidal ideation  Auditory hallucination     Patient with known psychiatric history with auditory hallucinations and homicidal ideation. There is real anger in his voice when he states he is clinically sister. He is placed under involuntary commitment because of threat to others, and TTS consultation is obtained to assist with appropriate placement. Old records are reviewed confirming past psychiatric history.   I personally performed the services described in this documentation, which was scribed in my presence. The recorded information has been reviewed and is accurate.       Dione Booze, MD 09/06/14 [phone]

## 2014-09-06 NOTE — ED Notes (Signed)
Chg rn notified

## 2014-09-06 NOTE — ED Notes (Signed)
Dr Denton LankSteinl aware pt asking for Geodon 10mg  - states d/t feels upset and anxiety level is increasing. Pt asking [employer] Off Duty Officers to put handcuffs on him.

## 2014-09-06 NOTE — ED Notes (Signed)
Security called to wand the pt.  

## 2014-09-06 NOTE — ED Notes (Signed)
Sitter at bedside.

## 2014-09-06 NOTE — Progress Notes (Signed)
Seeking inpatient psychiatric placement for pt.  Referred to: [facility]- per [facility]- [facility]- obtained authorization [id number] from [facility] cln DixieSonya. [facility] with [facility] and faxed referral (including 3 page regional form) at 09:55. Hicks states intake RN will call when referral is reviewed.  Declined due to voilent hx: [facility]- [facility]- per [facility]  All other facilities contacted ([facility], [facility], [facility], [facility], [facility], [facility], [facility], [facility], [facility]) at capacity today and not accepting referrals. [facility] and [facility] unable to accept adult [facility] therefore not referred there. Left voicemail with Turner DanielsRowan and will refer if there is bed availability.   Ilean SkillMeghan Shaianne Nucci, MSW, LCSWA Clinical Social Work, Disposition  09/06/2014 [phone]

## 2014-09-06 NOTE — ED Notes (Signed)
Off Duty [employer] at bedside per pt's request during injection. Pt tolerated well. Pt ate snack given also.

## 2014-09-06 NOTE — ED Notes (Signed)
Pt states his girlfriend may ask for "[place]" when she calls him back. Advised pt she will need to ask for his real name d/t any staff member may answer phone and he may miss his call. Pt called her back to advise her of visiting hours and his real name. Pt then returned to room w/sitter.

## 2014-09-07 MED ORDER — ZIPRASIDONE MESYLATE 20 MG IM SOLR
10.0000 mg | Freq: Once | INTRAMUSCULAR | Status: AC
  Administered 2014-09-07: 10 mg via INTRAMUSCULAR
  Filled 2014-09-07: qty 20
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
## 2014-09-07 NOTE — ED Notes (Signed)
Pt at nurses station making 2nd phone call of the day. After phone call pt requesting "a shot." MD being notified.

## 2014-09-07 NOTE — ED Notes (Signed)
Patient Meal tray delivered.

## 2014-09-07 NOTE — Progress Notes (Addendum)
1:00pm: Patient is active with [facility], [facility]:  [phone]. Reports patient took a large amount of JordanMali (ectacy) 2 weeks ago and keeps reporting he is not feeling right.  At that time he reported it was SI/?OD, [facility] and was psychiatrically cleared. On Friday July 15th, patient called the crisis line in which he reported he was still not feeling right, aware and asked NOT to come to any [facility], and was dropped off at [facility].  Patient cleared from [facility]. Called ACT team again to pick him up and then brought to [facility].  Patient is active with injections and is consistent with following medication treatment plan. ([facility] manages his medications) Haldol IM every 2 weeks, most recent is Thursday July 14th. Patient's current stressors: 1. Homeless (gf recently broke up with him and he had to move out of her home)     2. Sister is his payee and he does not want her as his payee any longer.  ACT team is working on this, but it is not a fast process with new change being September at the earliest.   LCSW aware of patient and previous aggressive behaviors towards staff and long history of legal issues. Patient resting in room, medicated and sitter at bedside. [employer] is in POD C for additional support.  Call placed to Geoffry Paradiseichard Aronson as requested by AD with Security. [facility] also [facility] to [facility] and notes sent with most recent behaviors, however behaviors do not meet criteria at this time for patient to be placed on expedited list for admission per [facility]. Previous behaviors of aggression are all dated back in 2014 and beyond.  For patient to be expedited with admission, behaviors must be documented by nursing and other clinical staff that show aggression (physical, elopement, and dangerous involving restraints/[employer]) and MUST BE ACTIVE THIS ADMISSION  [facility] aware of patient and verified that he is on the wait list, but wait list for males is very long.  LCSW will continue to stay in contact with  [facility], security, and [employer] regarding patient's status on wait list.  Deretha EmoryHannah Phillips Goulette LCSW, MSW Clinical Social Work: Emergency Room [phone]

## 2014-09-07 NOTE — ED Notes (Signed)
MD Radford PaxBeaton  made aware patient request for "shot" MD states let the next shift MD handle. Next Shift not here at time.

## 2014-09-07 NOTE — ED Notes (Signed)
Meal Tray delivered.

## 2014-09-07 NOTE — ED Notes (Signed)
Pt has utilized his 2 phone calls for the day.

## 2014-09-07 NOTE — ED Notes (Signed)
Pt upset about being asked to go back into room. RN assessing patient when patient got verbally aggressive and started making threating physical gestures. RN deescalated the situation. Pt now calm and in room. Pt requesting  of Geodon.

## 2014-09-07 NOTE — ED Notes (Signed)
Family at bedside. 

## 2014-09-07 NOTE — ED Notes (Signed)
Meal tray at bedside.  

## 2014-09-08 NOTE — ED Provider Notes (Addendum)
Resting comfortably in bed. Alert, cooperative, denies complaint.  Results for orders placed or performed during the hospital encounter of 09/06/14  Comprehensive metabolic panel  Result Value Ref Range   Sodium 137 135 - 145 mmol/L   Potassium 3.8 3.5 - 5.1 mmol/L   Chloride 105 101 - 111 mmol/L   CO2 30 22 - 32 mmol/L   Glucose, Bld 90 65 - 99 mg/dL   BUN 13 6 - 20 mg/dL   Creatinine, Ser 8.461.12 0.61 - 1.24 mg/dL   Calcium 9.2 8.9 - 96.210.3 mg/dL   Total Protein 6.8 6.5 - 8.1 g/dL   Albumin 3.8 3.5 - 5.0 g/dL   AST 25 15 - 41 U/L   ALT 25 17 - 63 U/L   Alkaline Phosphatase 72 38 - 126 U/L   Total Bilirubin 1.0 0.3 - 1.2 mg/dL   GFR calc non Af Amer >60 >60 mL/min   GFR calc Af Amer >60 >60 mL/min   Anion gap 2 (L) 5 - 15  Ethanol (ETOH)  Result Value Ref Range   Alcohol, Ethyl (B) <5 <5 mg/dL  Salicylate level  Result Value Ref Range   Salicylate Lvl <4.0 2.8 - 30.0 mg/dL  Acetaminophen level  Result Value Ref Range   Acetaminophen (Tylenol), Serum <10 (L) 10 - 30 ug/mL  CBC  Result Value Ref Range   WBC 7.8 4.0 - 10.5 K/uL   RBC 4.85 4.22 - 5.81 MIL/uL   Hemoglobin 15.3 13.0 - 17.0 g/dL   HCT 95.245.0 84.139.0 - 32.452.0 %   MCV 92.8 78.0 - 100.0 fL   MCH 31.5 26.0 - 34.0 pg   MCHC 34.0 30.0 - 36.0 g/dL   RDW 40.112.3 02.711.5 - 25.315.5 %   Platelets 177 150 - 400 K/uL  Urine rapid drug screen (hosp performed) (Not [facility])  Result Value Ref Range   Opiates NONE DETECTED NONE DETECTED   Cocaine NONE DETECTED NONE DETECTED   Benzodiazepines NONE DETECTED NONE DETECTED   Amphetamines NONE DETECTED NONE DETECTED   Tetrahydrocannabinol POSITIVE (A) NONE DETECTED   Barbiturates NONE DETECTED NONE DETECTED   Dg Chest 2 View  09/02/2014   CLINICAL DATA:  Chest pain  EXAM: CHEST  2 VIEW  COMPARISON:  April 22, 2014  FINDINGS: Lungs are clear. Heart size and pulmonary vascularity are normal. No adenopathy. No pneumothorax. No bone lesions.  IMPRESSION: No abnormality noted.   Electronically Signed    By: Bretta BangWilliam  Woodruff III M.D.   On: 09/02/2014 10:14     Doug SouSam Dakai Braithwaite, MD 09/08/14 [phone]  240 alert cooperative denies si or hi Plan  f/u with ACT team who will coe to get pt. IVC rescinded by me  Doug SouSam Brinden Kincheloe, MD 09/08/14 1445

## 2014-09-08 NOTE — ED Notes (Signed)
[facility]. Dahlia ClientHannah, SW, in w/pt.
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
## 2014-09-08 NOTE — ED Notes (Signed)
Pt asleep, sitter at bedside, [employer] remains in Pod C

## 2014-09-08 NOTE — ED Notes (Addendum)
Pt on phone w/Jeff, [facility] - [phone]

## 2014-09-08 NOTE — Progress Notes (Addendum)
LCSW followed up with patient after TTS assessment. Patient requesting LCSW call his sister who is his Payee: [name] [name] [phone] with regards to his money and wanting $200.00.   [name] [name] reports she has managed his money for the last 3 months, but it is becoming stressful and he may need his ACT team to manage his money. Patient is aware his check has been cut from $733.00 to $453.00. He reports he wants his money to get a room, buy some food, and pay off a guy whom he offers money. He reports he does not want to use it for drugs.  Sister reports she is not going to enable his behavior and let him spend all of his money. Reports she does not need his money, but is going to help manage.   Plan at this time is patient has been re-assessed for discharge.  Remains under IVC as well as [facility] wait list until notes completed documenting he can be discharged.  Contacted patient's ACT team, [name] [name], left a message and will follow up once call returned for transport purposes. [name] [name] called back as he is on the crisis team today. Reports give patient a bus pass and have him call [facility] crisis line in efforts to know where he will be and they will follow up.   Plan: DC Home with ACT Services Call placed to sister in effort to get money for patient to have a place to stay tonight. Reports he cannot stay with mother or sister, wants to stay in a motel. ACT willing to pick patient up.  Have not yet reached sister in effort to make reservation for hotel.  Patient becoming restless, still redirectable, reporting he does not want a shelter.  ACT  Can be here at 3:15 for pick up.  [name] EmoryHannah Katerina Zurn LCSW, MSW Clinical Social Work: Emergency Room [phone]

## 2014-09-08 NOTE — ED Notes (Signed)
Breakfast tray ordered 

## 2014-09-08 NOTE — BH Assessment (Signed)
Re-Assessment   Writer re-assessed patient.  Patient denies SI/HI/Psychos/  Patient reports, "I want some type of counseling set up with me and my girlfriend, because she is mad at me".   Patient reports that he has an Investment banker, operationalACTT Team.  Patient reports having positive family supports with his mother.  Per Dr. Lucianne MussKumar patient does not meet criteria for inpatient hospitalization.  Patient will be discharged back to his ACTT Team.

## 2014-09-08 NOTE — Discharge Instructions (Signed)
If you have any thoughts of wanting to harm yourself or others, call 911 or contact your ACT team member

## 2014-10-09 ENCOUNTER — Encounter (HOSPITAL_COMMUNITY): Payer: Self-pay | Admitting: Vascular Surgery

## 2014-10-09 ENCOUNTER — Emergency Department (HOSPITAL_COMMUNITY)
Admission: EM | Admit: 2014-10-09 | Discharge: 2014-10-10 | Disposition: A | Discharge status: Home / Self Care | Payer: Medicaid Other | Attending: Emergency Medicine | Admitting: Emergency Medicine

## 2014-10-09 ENCOUNTER — Emergency Department (HOSPITAL_COMMUNITY)
Admission: EM | Admit: 2014-10-09 | Discharge: 2014-10-09 | Discharge status: Against Medical Advice | Payer: Medicaid Other | Attending: Emergency Medicine | Admitting: Emergency Medicine

## 2014-10-09 DIAGNOSIS — Z720 Tobacco use: Secondary | ICD-10-CM | POA: Insufficient documentation

## 2014-10-09 DIAGNOSIS — R45850 Homicidal ideations: Secondary | ICD-10-CM | POA: Insufficient documentation

## 2014-10-09 DIAGNOSIS — Z79899 Other long term (current) drug therapy: Secondary | ICD-10-CM | POA: Diagnosis not present

## 2014-10-09 DIAGNOSIS — F209 Schizophrenia, unspecified: Secondary | ICD-10-CM | POA: Diagnosis not present

## 2014-10-09 DIAGNOSIS — R451 Restlessness and agitation: Secondary | ICD-10-CM | POA: Diagnosis not present

## 2014-10-09 DIAGNOSIS — J45909 Unspecified asthma, uncomplicated: Secondary | ICD-10-CM | POA: Insufficient documentation

## 2014-10-09 DIAGNOSIS — Z590 Homelessness: Secondary | ICD-10-CM | POA: Diagnosis not present

## 2014-10-09 DIAGNOSIS — R454 Irritability and anger: Secondary | ICD-10-CM | POA: Insufficient documentation

## 2014-10-09 DIAGNOSIS — F419 Anxiety disorder, unspecified: Secondary | ICD-10-CM | POA: Diagnosis present

## 2014-10-09 MED ORDER — ZIPRASIDONE MESYLATE 20 MG IM SOLR
10.0000 mg | Freq: Once | INTRAMUSCULAR | Status: AC
  Administered 2014-10-10: 10 mg via INTRAMUSCULAR
  Filled 2014-10-09: qty 20

## 2014-10-09 NOTE — ED Notes (Signed)
Pt reports to the ED for eval of anxiety. Reports he was seen earlier and needed a Geodon shot but he could not wait because he had to get his check. Denies any HI/SI. Reports he is just anxious because he knows that another individual is out to get him. Pt denies any hallucinations or substance abuse. Pt A&Ox4, resp e/u, and skin warm and dry.

## 2014-10-09 NOTE — ED Notes (Signed)
Pt reports to the ED for eval of HI. He was at group therapy today and made some comments about hurting another person. He reports he just needs a shot of Geodon and he will be better. Pt denies any SI at this time and reports he no longer feels like hurting the other person any more. Pt A&Ox4, resp e/u, and skin warm and dry.

## 2014-10-09 NOTE — ED Provider Notes (Signed)
CSN: [id number]     Arrival date & time 10/09/14  2207 History  This chart was scribed for Wynetta Emery, PA-C, working with Elwin Mocha, MD by Elon Spanner, ED Scribe. This patient was seen in room TR09C/TR09C and the patient's care was started at 10:42 PM.   Chief Complaint  Patient presents with  . Panic Attack   The history is provided by the patient. No language interpreter was used.   HPI Comments: [name] [name] is a 39 y.o. homeless male with hx of schizophrenia who presents to the Emergency Department complaining of anxiety and requesting a shot of Geodon to "calm his nerves."  Patient reports he spoke to his ACT team today and was told to come to ED to receive his Geodon shot.  There is some current, vague HI but denies SI. Patient is accompanied by police, he is become violent and agitated during questioning, not willing to go into the details of his homicidal thoughts.  Past Medical History  Diagnosis Date  . Asthma   . Homeless   . Schizophrenia    History reviewed. No pertinent past surgical history. No family history on file. Social History  Substance Use Topics  . Smoking status: Current Every Day Smoker  . Smokeless tobacco: None  . Alcohol Use: Yes    Review of Systems  Unable to perform ROS: Psychiatric disorder  Psychiatric/Behavioral: Negative for suicidal ideas.       Nonspecific HI      Allergies  Peach seed and Sudafed  Home Medications   Prior to Admission medications   Medication Sig Start Date End Date Taking? Authorizing Provider  albuterol (PROVENTIL HFA;VENTOLIN HFA) 108 (90 BASE) MCG/ACT inhaler Inhale 2 puffs into the lungs every 6 (six) hours as needed (asthma symptoms).    Historical Provider, MD  divalproex (DEPAKOTE ER) 500 MG 24 hr tablet Take 2 tablets (1,000 mg total) by mouth daily. Patient not taking: Reported on 08/04/2014 04/03/12   Leata Mouse, MD  haloperidol decanoate (HALDOL DECANOATE) 100 MG/ML injection  Inject 175 mg into the muscle every 14 (fourteen) days.     Historical Provider, MD  lamoTRIgine (LAMICTAL) 25 MG tablet Take 25 mg by mouth 2 (two) times daily.    Historical Provider, MD   BP 125/78 mmHg  Pulse 63  Temp(Src) 98.2 F (36.8 C) (Oral)  Resp 18  Ht 5\' 10"  (1.778 m)  Wt 220 lb (99.791 kg)  BMI 31.57 kg/m2  SpO2 100% Physical Exam  Constitutional: He is oriented to person, place, and time. He appears well-developed and well-nourished. No distress.  HENT:  Head: Normocephalic and atraumatic.  Eyes: Conjunctivae and EOM are normal.  Neck: Neck supple. No tracheal deviation present.  Cardiovascular: Normal rate.   Pulmonary/Chest: Effort normal. No respiratory distress.  Musculoskeletal: Normal range of motion.  Neurological: He is alert and oriented to person, place, and time.  Skin: Skin is warm and dry.  Psychiatric: His affect is angry and labile. He is agitated and aggressive. He expresses homicidal ideation. He expresses no suicidal ideation. He is noncommunicative.  Nursing note and vitals reviewed.   ED Course  Procedures (including critical care time)  DIAGNOSTIC STUDIES: Oxygen Saturation is 100% on RA, normal by my interpretation.    COORDINATION OF CARE:  10:53 PM Discussed treatment plan with patient at bedside.  Patient acknowledges and agrees with plan.    Labs Review Labs Reviewed  CBC WITH DIFFERENTIAL/PLATELET  COMPREHENSIVE METABOLIC PANEL  URINE RAPID DRUG SCREEN,  HOSP PERFORMED  ETHANOL    Imaging Review No results found. I have personally reviewed and evaluated these images and lab results as part of my medical decision-making.   EKG Interpretation None      MDM   Final diagnoses:  Homicidal ideation    Filed Vitals:   10/09/14 2222  BP: 125/78  Pulse: 63  Temp: 98.2 F (36.8 C)  TempSrc: Oral  Resp: 18  Height:  (1.778 m)  Weight: 220 lb (99.791 kg)  SpO2: 100%    Medications  ziprasidone (GEODON)  injection 10 mg (not administered)    [name] [name] is a pleasant 38 y.o. male presenting with acute agitation, he was sent to the ED by Christiane Ha from act team because he was having homicidal thoughts, patient will not give more information about these thoughts. He becomes agitated and aggressive when questioned on this. Patient is here voluntarily but will need to be involuntarily committed if he would like to leave the ED. Police have been very helpful in calming him down and convincing him to stay. Patient will be given a small dose of Geodon IM pending medical clearance.   Case is discussed with attending Dr. Mora Bellman. PA [place] will evaluate patient's blood and urine and medically clear him.  I personally performed the services described in this documentation, which was scribed in my presence. The recorded information has been reviewed and is accurate.    [name] Reining Reyes Fifield, PA-C 10/09/14 8119  Tomasita Crumble, MD 10/10/14 [phone]

## 2014-10-09 NOTE — ED Notes (Signed)
Pt with [employer] states he cannot wait any more because he has to pick up his check. Pt denies any HI or SI at this time.States if he cannot just get his shot he will leave. Pt cautioned against leaving and RN attempted to have patient stay. However, he continues to want to leave. Pt not IVC'ed at this time.

## 2014-10-09 NOTE — ED Notes (Signed)
Contacted Act team and spoke with Quillian Quince and he stated that pt admitted to homicidal ideations earlier today. MD made aware.

## 2014-10-10 DIAGNOSIS — F209 Schizophrenia, unspecified: Secondary | ICD-10-CM | POA: Diagnosis present

## 2014-10-10 LAB — COMPREHENSIVE METABOLIC PANEL
ALT: 12 U/L — ABNORMAL LOW (ref 17–63)
AST: 19 U/L (ref 15–41)
Albumin: 4.1 g/dL (ref 3.5–5.0)
Alkaline Phosphatase: 73 U/L (ref 38–126)
Anion gap: 8 (ref 5–15)
BUN: 8 mg/dL (ref 6–20)
CO2: 24 mmol/L (ref 22–32)
Calcium: 9.5 mg/dL (ref 8.9–10.3)
Chloride: 107 mmol/L (ref 101–111)
Creatinine, Ser: 1.27 mg/dL — ABNORMAL HIGH (ref 0.61–1.24)
GFR calc Af Amer: >60 mL/min (ref >60)
GFR calc non Af Amer: >60 mL/min (ref >60)
Glucose, Bld: 90 mg/dL (ref 65–99)
Potassium: 4 mmol/L (ref 3.5–5.1)
Sodium: 139 mmol/L (ref 135–145)
Total Bilirubin: 1 mg/dL (ref 0.3–1.2)
Total Protein: 7 g/dL (ref 6.5–8.1)

## 2014-10-10 LAB — CBC WITH DIFFERENTIAL/PLATELET
Basophils Absolute: 0 10*3/uL (ref 0.0–0.1)
Basophils Relative: 0 % (ref 0–1)
Eosinophils Absolute: 0.1 10*3/uL (ref 0.0–0.7)
Eosinophils Relative: 1 % (ref 0–5)
HCT: 48.4 % (ref 39.0–52.0)
Hemoglobin: 16.6 g/dL (ref 13.0–17.0)
Lymphocytes Relative: 34 % (ref 12–46)
Lymphs Abs: 2.3 10*3/uL (ref 0.7–4.0)
MCH: 32.2 pg (ref 26.0–34.0)
MCHC: 34.3 g/dL (ref 30.0–36.0)
MCV: 93.8 fL (ref 78.0–100.0)
Monocytes Absolute: 0.5 10*3/uL (ref 0.1–1.0)
Monocytes Relative: 8 % (ref 3–12)
Neutro Abs: 3.8 10*3/uL (ref 1.7–7.7)
Neutrophils Relative %: 57 % (ref 43–77)
Platelets: 193 10*3/uL (ref 150–400)
RBC: 5.16 MIL/uL (ref 4.22–5.81)
RDW: 12.2 % (ref 11.5–15.5)
WBC: 6.7 10*3/uL (ref 4.0–10.5)

## 2014-10-10 LAB — RAPID URINE DRUG SCREEN, HOSP PERFORMED
Amphetamines: NOT DETECTED
Barbiturates: NOT DETECTED
Benzodiazepines: NOT DETECTED
Cocaine: NOT DETECTED
Opiates: NOT DETECTED
Tetrahydrocannabinol: POSITIVE — AB

## 2014-10-10 LAB — ETHANOL: Alcohol, Ethyl (B): <5 mg/dL (ref <5)

## 2014-10-10 NOTE — ED Notes (Signed)
All clothing removed and placed in wine scrubs.  Security in to wand pt.  Sitter at bedside.

## 2014-10-10 NOTE — ED Notes (Addendum)
Pt standing at nurses' desk talking w/[employer] officers and security. Pt noted to be ambulatory w/o difficulty. Pt noted to be calm, cooperative. 2nd tray ordered d/t pt does not like food on first. Pt asking if may go see his mother who is in the hospital. RN advised pt when he is d/c'd, he may. Off-Duty [employer] Officer assisting w/pt. Pt then asking if may go out and smoke - advised pt not at this time and reminded pt of no smoking policy. Voiced understanding.

## 2014-10-10 NOTE — ED Notes (Signed)
Molly Maduro, EMT gave sitter lunch break

## 2014-10-10 NOTE — ED Notes (Signed)
Beulah Gandy, ACT, [phone] - pt being d/c'd to home. Aware pt has his number and is aware to contact him when he gets to wherever he is staying. Pt advised he can arrange transportation from hospital.

## 2014-10-10 NOTE — ED Notes (Signed)
John, pt's ACT team person, called inquiring plan for pt and asking if pt is IVC. Advised pt is voluntary and may be d/c'd to home pending telepsych. Asked if will be picking up pt. John advised no. Stating when pt's come in to the hospital voluntarily, it is not ACT's responsibility to pick them up. John requested for RN to notify pt to contact him when he is d/c'd so may be able to follow up - [phone].
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
## 2014-10-10 NOTE — Discharge Instructions (Signed)
Take your medicines as prescribed.  See your doctor.   Return to ER if you are agitated, thoughts of harming yourself or others, hallucinations.

## 2014-10-10 NOTE — ED Provider Notes (Signed)
  Physical Exam  BP 127/83 mmHg  Pulse 69  Temp(Src) 98.1 F (36.7 C) (Oral)  Resp 20  Ht  (1.778 m)  Wt 220 lb (99.791 kg)  BMI 31.57 kg/m2  SpO2 100%  Physical Exam  ED Course  Procedures  MDM Patient here with agitation, given geodon yesterday. No issues as per nursing. Will get psych consult this AM.   12:51 PM Psych saw patient. Constance Haw, psych NP, told me that he is cleared to be discharged. His act team contacted and will dc.   Richardean Canal, MD 10/10/14 [phone]
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
## 2014-10-10 NOTE — BH Assessment (Signed)
Received notification of TTS consult request. Spoke to Dr. Elwin Mocha who said Pt was seen by previous provider. Tele-assessment will be initiated.  Harlin Rain Patsy Baltimore, LPC, [facility], [facility] Triage Specialist [phone]

## 2014-10-10 NOTE — ED Notes (Signed)
Security aware, per Tanna Savoy, [facility], request - [employer] is to be w/pt 24/7.

## 2014-10-12 ENCOUNTER — Encounter (HOSPITAL_COMMUNITY): Payer: Self-pay | Admitting: *Deleted

## 2014-10-12 ENCOUNTER — Emergency Department (HOSPITAL_COMMUNITY)
Admission: EM | Admit: 2014-10-12 | Discharge: 2014-10-13 | Disposition: A | Discharge status: Home / Self Care | Payer: Medicaid Other | Attending: Emergency Medicine | Admitting: Emergency Medicine

## 2014-10-12 DIAGNOSIS — R45850 Homicidal ideations: Secondary | ICD-10-CM

## 2014-10-12 DIAGNOSIS — Z79899 Other long term (current) drug therapy: Secondary | ICD-10-CM | POA: Diagnosis not present

## 2014-10-12 DIAGNOSIS — J45909 Unspecified asthma, uncomplicated: Secondary | ICD-10-CM | POA: Insufficient documentation

## 2014-10-12 DIAGNOSIS — R451 Restlessness and agitation: Secondary | ICD-10-CM | POA: Insufficient documentation

## 2014-10-12 DIAGNOSIS — Z590 Homelessness: Secondary | ICD-10-CM | POA: Insufficient documentation

## 2014-10-12 DIAGNOSIS — Z720 Tobacco use: Secondary | ICD-10-CM | POA: Insufficient documentation

## 2014-10-12 DIAGNOSIS — R2689 Other abnormalities of gait and mobility: Secondary | ICD-10-CM | POA: Diagnosis not present

## 2014-10-12 DIAGNOSIS — F209 Schizophrenia, unspecified: Secondary | ICD-10-CM | POA: Diagnosis present

## 2014-10-12 DIAGNOSIS — R55 Syncope and collapse: Secondary | ICD-10-CM | POA: Diagnosis present

## 2014-10-12 LAB — COMPREHENSIVE METABOLIC PANEL
ALT: 12 U/L — ABNORMAL LOW (ref 17–63)
AST: 21 U/L (ref 15–41)
Albumin: 4.4 g/dL (ref 3.5–5.0)
Alkaline Phosphatase: 81 U/L (ref 38–126)
Anion gap: 7 (ref 5–15)
BUN: 9 mg/dL (ref 6–20)
CO2: 23 mmol/L (ref 22–32)
Calcium: 9.7 mg/dL (ref 8.9–10.3)
Chloride: 108 mmol/L (ref 101–111)
Creatinine, Ser: 1.04 mg/dL (ref 0.61–1.24)
GFR calc Af Amer: >60 mL/min (ref >60)
GFR calc non Af Amer: >60 mL/min (ref >60)
Glucose, Bld: 108 mg/dL — ABNORMAL HIGH (ref 65–99)
Potassium: 4.1 mmol/L (ref 3.5–5.1)
Sodium: 138 mmol/L (ref 135–145)
Total Bilirubin: 1.2 mg/dL (ref 0.3–1.2)
Total Protein: 7.8 g/dL (ref 6.5–8.1)

## 2014-10-12 LAB — RAPID URINE DRUG SCREEN, HOSP PERFORMED
Amphetamines: NOT DETECTED
Barbiturates: NOT DETECTED
Benzodiazepines: NOT DETECTED
Cocaine: NOT DETECTED
Opiates: NOT DETECTED
Tetrahydrocannabinol: POSITIVE — AB

## 2014-10-12 LAB — CBC
HCT: 52.2 % — ABNORMAL HIGH (ref 39.0–52.0)
Hemoglobin: 17.7 g/dL — ABNORMAL HIGH (ref 13.0–17.0)
MCH: 31.7 pg (ref 26.0–34.0)
MCHC: 33.9 g/dL (ref 30.0–36.0)
MCV: 93.4 fL (ref 78.0–100.0)
Platelets: 210 10*3/uL (ref 150–400)
RBC: 5.59 MIL/uL (ref 4.22–5.81)
RDW: 12.2 % (ref 11.5–15.5)
WBC: 7.8 10*3/uL (ref 4.0–10.5)

## 2014-10-12 LAB — SALICYLATE LEVEL: Salicylate Lvl: <4 mg/dL (ref 2.8–30.0)

## 2014-10-12 LAB — ETHANOL: Alcohol, Ethyl (B): <5 mg/dL (ref <5)

## 2014-10-12 LAB — ACETAMINOPHEN LEVEL: Acetaminophen (Tylenol), Serum: <10 ug/mL — ABNORMAL LOW (ref 10–30)

## 2014-10-12 MED ORDER — ZIPRASIDONE MESYLATE 20 MG IM SOLR
10.0000 mg | Freq: Once | INTRAMUSCULAR | Status: AC
  Administered 2014-10-12: 10 mg via INTRAMUSCULAR
  Filled 2014-10-12: qty 20

## 2014-10-12 MED ORDER — LORAZEPAM 1 MG PO TABS
1.0000 mg | ORAL_TABLET | Freq: Once | ORAL | Status: AC
  Administered 2014-10-12: 1 mg via ORAL
  Filled 2014-10-12: qty 1

## 2014-10-12 MED ORDER — LAMOTRIGINE 100 MG PO TABS
100.0000 mg | ORAL_TABLET | Freq: Two times a day (BID) | ORAL | Status: DC
  Administered 2014-10-12: 50 mg via ORAL
  Filled 2014-10-12 (×2): qty 1

## 2014-10-12 MED ORDER — LORAZEPAM 1 MG PO TABS
1.0000 mg | ORAL_TABLET | Freq: Three times a day (TID) | ORAL | Status: DC | PRN
  Administered 2014-10-12 – 2014-10-13 (×2): 1 mg via ORAL
  Filled 2014-10-12 (×3): qty 1

## 2014-10-12 MED ORDER — STERILE WATER FOR INJECTION IJ SOLN
INTRAMUSCULAR | Status: AC
  Administered 2014-10-12: 10 mL
  Filled 2014-10-12: qty 10

## 2014-10-12 MED ORDER — ONDANSETRON HCL 4 MG PO TABS: 4.0000 mg | ORAL_TABLET | Freq: Three times a day (TID) | ORAL | Status: DC | PRN

## 2014-10-12 MED ORDER — GABAPENTIN 300 MG PO CAPS
300.0000 mg | ORAL_CAPSULE | Freq: Two times a day (BID) | ORAL | Status: DC
  Administered 2014-10-12 – 2014-10-13 (×3): 300 mg via ORAL
  Filled 2014-10-12 (×3): qty 1

## 2014-10-12 MED ORDER — IBUPROFEN 200 MG PO TABS: 600.0000 mg | ORAL_TABLET | Freq: Three times a day (TID) | ORAL | Status: DC | PRN

## 2014-10-12 MED ORDER — ALUM & MAG HYDROXIDE-SIMETH 200-200-20 MG/5ML PO SUSP: 30.0000 mL | ORAL | Status: DC | PRN

## 2014-10-12 MED ORDER — DIVALPROEX SODIUM ER 500 MG PO TB24
1000.0000 mg | ORAL_TABLET | Freq: Every day | ORAL | Status: DC
  Administered 2014-10-13: 1000 mg via ORAL
  Filled 2014-10-12 (×2): qty 2

## 2014-10-12 MED ORDER — LAMOTRIGINE 25 MG PO TABS
25.0000 mg | ORAL_TABLET | Freq: Two times a day (BID) | ORAL | Status: DC
  Administered 2014-10-12 – 2014-10-13 (×2): 25 mg via ORAL
  Filled 2014-10-12 (×4): qty 1

## 2014-10-12 MED ORDER — NICOTINE 21 MG/24HR TD PT24: 21.0000 mg | MEDICATED_PATCH | Freq: Every day | TRANSDERMAL | Status: DC

## 2014-10-12 MED ORDER — ZOLPIDEM TARTRATE 5 MG PO TABS
5.0000 mg | ORAL_TABLET | Freq: Every evening | ORAL | Status: DC | PRN
  Administered 2014-10-12: 5 mg via ORAL
  Filled 2014-10-12: qty 1

## 2014-10-12 MED ORDER — ACETAMINOPHEN 325 MG PO TABS: 650.0000 mg | ORAL_TABLET | ORAL | Status: DC | PRN

## 2014-10-12 NOTE — ED Notes (Signed)
Bed: [facility]PT4 Expected date:  Expected time:  Means of arrival:  Comments: hold 

## 2014-10-12 NOTE — ED Notes (Signed)
Bed: WA33 Expected date:  Expected time:  Means of arrival:  Comments: Triage 4 

## 2014-10-12 NOTE — ED Notes (Signed)
Now pt upset, wants to be in a room. rn explained there are no rooms available. Triage chair laid down flat, given blanket.   Now pt getting more agitated, talking about wanting to go to jail, shouting.
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
## 2014-10-12 NOTE — ED Notes (Signed)
Pt requesting Geodon, will tell provider

## 2014-10-12 NOTE — ED Notes (Signed)
[name] [name] AD speaking with pt.  Pt keeps talking about how he wants to leave if he cannot go back to a bed.

## 2014-10-12 NOTE — ED Provider Notes (Signed)
Pt is homicidal,  He is IVCed and will see psyc again in the am  Bethann Berkshire, MD 10/12/14 1801
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
## 2014-10-12 NOTE — Progress Notes (Signed)
Pt has a Emergency planning/management officer with him,.Presently he is pleasant and cooperative. Pt does have his GF back. He initially wanted family to bring him a cheeseburger and fries. Security went and brought the pt back what he wanted. Pt does appear calm now and requested his meds after his company leaves
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
## 2014-10-13 ENCOUNTER — Encounter (HOSPITAL_COMMUNITY): Payer: Self-pay | Admitting: Emergency Medicine

## 2014-10-13 ENCOUNTER — Emergency Department (HOSPITAL_COMMUNITY)
Admission: EM | Admit: 2014-10-13 | Discharge: 2014-10-13 | Discharge status: Against Medical Advice | Payer: Medicaid Other | Attending: Emergency Medicine | Admitting: Emergency Medicine

## 2014-10-13 ENCOUNTER — Emergency Department (HOSPITAL_COMMUNITY)
Admission: EM | Admit: 2014-10-13 | Discharge: 2014-10-13 | Disposition: A | Discharge status: Home / Self Care | Payer: Medicaid Other | Source: Home / Self Care | Attending: Emergency Medicine | Admitting: Emergency Medicine

## 2014-10-13 DIAGNOSIS — F99 Mental disorder, not otherwise specified: Secondary | ICD-10-CM

## 2014-10-13 DIAGNOSIS — R451 Restlessness and agitation: Secondary | ICD-10-CM

## 2014-10-13 DIAGNOSIS — J45909 Unspecified asthma, uncomplicated: Secondary | ICD-10-CM | POA: Diagnosis not present

## 2014-10-13 DIAGNOSIS — F209 Schizophrenia, unspecified: Secondary | ICD-10-CM

## 2014-10-13 DIAGNOSIS — R2689 Other abnormalities of gait and mobility: Secondary | ICD-10-CM | POA: Diagnosis present

## 2014-10-13 DIAGNOSIS — Z720 Tobacco use: Secondary | ICD-10-CM | POA: Insufficient documentation

## 2014-10-13 HISTORY — DX: Alcohol abuse, uncomplicated: F10.10

## 2014-10-13 HISTORY — DX: Other symptoms and signs involving appearance and behavior: R46.89

## 2014-10-13 LAB — CBG MONITORING, ED: Glucose-Capillary: 88 mg/dL (ref 65–99)

## 2014-10-13 MED ORDER — LORAZEPAM 2 MG/ML IJ SOLN
2.0000 mg | Freq: Once | INTRAMUSCULAR | Status: AC
  Administered 2014-10-13: 2 mg via INTRAMUSCULAR
  Filled 2014-10-13: qty 1

## 2014-10-13 MED ORDER — ZIPRASIDONE MESYLATE 20 MG IM SOLR
INTRAMUSCULAR | Status: AC
  Filled 2014-10-13: qty 20

## 2014-10-13 MED ORDER — LORAZEPAM 1 MG PO TABS: 1.0000 mg | ORAL_TABLET | Freq: Three times a day (TID) | ORAL | Status: DC | PRN

## 2014-10-13 MED ORDER — HALOPERIDOL LACTATE 5 MG/ML IJ SOLN
5.0000 mg | Freq: Once | INTRAMUSCULAR | Status: AC
  Administered 2014-10-13: 5 mg via INTRAMUSCULAR
  Filled 2014-10-13: qty 1

## 2014-10-13 MED ORDER — STERILE WATER FOR INJECTION IJ SOLN
INTRAMUSCULAR | Status: AC
  Filled 2014-10-13: qty 10

## 2014-10-13 MED ORDER — TRAZODONE HCL 50 MG PO TABS: 50.0000 mg | ORAL_TABLET | Freq: Every evening | ORAL | Status: DC | PRN

## 2014-10-13 MED ORDER — ZIPRASIDONE MESYLATE 20 MG IM SOLR
10.0000 mg | Freq: Once | INTRAMUSCULAR | Status: AC
  Administered 2014-10-13: 10 mg via INTRAMUSCULAR
  Filled 2014-10-13: qty 20

## 2014-10-13 MED ORDER — AMMONIA AROMATIC IN INHA
RESPIRATORY_TRACT | Status: AC
  Filled 2014-10-13: qty 10

## 2014-10-13 MED ORDER — ACETAMINOPHEN 325 MG PO TABS: 650.0000 mg | ORAL_TABLET | ORAL | Status: DC | PRN
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
## 2014-10-13 NOTE — Discharge Instructions (Signed)
For your ongoing behavioral health needs, you are advised to continue treatment with [facility], your current provider:       [facility]      [facility], [address]      [address]      [place], [place]  [address]      [phone]

## 2014-10-13 NOTE — ED Notes (Signed)
Pt at the nursing station asking for specific nutrition diet. States he does not eat pork. Pt states "Why is there all the cops back here when last night there was only one?" [employer] informed patient of procedure.

## 2014-10-13 NOTE — B[facility] Suicide Risk Assessment (Signed)
   [facility] Discharge Suicide Risk Assessment   Nursing information obtained from:    Demographic factors:    Current Mental Status:    Loss Factors:    Historical Factors:    Risk Reduction Factors:    Total Time spent with patient: 25 minutes Principal Problem: Schizophrenia Diagnosis:   Patient Active Problem List   Diagnosis Date Noted  . Schizophrenia [F20.9] 10/10/2014    Priority: High     Continued Clinical Symptoms:    The "Alcohol Use Disorders Identification Test", Guidelines for Use in Primary Care, Second Edition.  World Science writer [facility]). Score between 0-7:  no or low risk or alcohol related problems. Score between 8-15:  moderate risk of alcohol related problems. Score between 16-19:  high risk of alcohol related problems. Score 20 or above:  warrants further diagnostic evaluation for alcohol dependence and treatment.   CLINICAL FACTORS:   Depression:   Aggression Comorbid alcohol abuse/dependence Impulsivity Insomnia Alcohol/Substance Abuse/Dependencies   Musculoskeletal: Strength & Muscle Tone: within normal limits Gait & Station: normal Patient leans: N/A  Psychiatric Specialty Exam:     Blood pressure 135/75, pulse 64, temperature 97.7 F (36.5 C), temperature source Oral, resp. rate 18, SpO2 100 %.There is no weight on file to calculate BMI.     General Appearance: Casual and Fairly Groomed  Patent attorney:: Good  Speech: Clear and Coherent and Normal Rate  Volume: Normal  Mood: Anxious  Affect: Appropriate and Congruent  Thought Process: Coherent, Goal Directed, Intact, Linear and Logical  Orientation: Full (Time, Place, and Person)  Thought Content: WDL  Suicidal Thoughts: No  Homicidal Thoughts: No intermittent toward a man named Angel, but inconsistent in reporting, quoted as stating that he would only harm someone else in self defense  Memory: Immediate; Fair Recent; Fair Remote; Fair  Judgement:  Fair  Insight: Fair  Psychomotor Activity: Normal  Concentration: Good  Recall: Good  Fund of Knowledge:Fair  Language: Fair  Akathisia: No  Handed:   AIMS (if indicated):    Assets: Communication Skills Desire for Improvement Resilience Social Support  ADL's: Intact  Cognition: WNL  Sleep:          COGNITIVE FEATURES THAT CONTRIBUTE TO RISK:  Closed-mindedness    SUICIDE RISK:   Minimal: No identifiable suicidal ideation.  Patients presenting with no risk factors but with morbid ruminations; may be classified as minimal risk based on the severity of the depressive symptoms  PLAN OF CARE: Discharge home; ACT Team notified.  Medical Decision Making:  Established Problem, Stable/Improving (1), Review of Psycho-Social Stressors (1), Review or order clinical lab tests (1), Review of Medication Regimen & Side Effects (2) and Review of New Medication or Change in Dosage (2)  Beau Fanny, FNP-BC 10/13/2014, 2:14 PM

## 2014-10-13 NOTE — ED Notes (Signed)
Patient is resting comfortably in chair.

## 2014-10-13 NOTE — Progress Notes (Signed)
CSW informed [facility] actt team of patient discharge.  CSW received call from probation officer asking to speak with patient, csw connected probation officer with patient. No further disposition at this time.   Olga Coaster, LCSW  Clinical Social Work  [employer] [phone]

## 2014-10-13 NOTE — Progress Notes (Signed)
Per psychiatrist and NP, patient disposition pending assessment from Actt team. Pt states that he is not homicidal towards anyone including Lawanna Kobus unless he is attacked first and its in self defense. Patient states if you can give me Geodon pills or medications i have never heard of I'll take those and never make another homicidal threat.   CSW called actt team lead, Trey Paula from [facility] who stated he can be at hospital around 5/6 pm due to prior commitments and no other team members available at this time but will send someone if available sooner.    CSW updated patient of disposition and need for [facility] actt team assessment per psychiatrist. Patient upset about having to wait till 5/6 pm stating he brought himself in voluntary. CSW explained that patient is currently under IVC. Pt requested to call actt team. Pt called Trey Paula who told him he could not come till 5/6. Patient became angry and yelling on the phone. Patient then threatened to blow up the office if he didn't come now. CSW attempted to proivde supportive counseling and redirect patient. Patient states, "I'll show my ass until I leave here." Pt stated that actt team just said they aren't coming till tomorrow now. CSW and pt discussed patient actions and homicidal threats to the actt team. CSW attempted to help patient to process his actions.  Patient states, "I'm am going to starve myself to death and I am not taking any more meds until i get out of here."   Olga Coaster, LCSW  Clinical Social Work  [employer] [phone]

## 2014-10-13 NOTE — ED Notes (Signed)
Patient brought bed sheets and belongings out of the room. Yelling Bring them on. I will beat his ass. I won't him that one right there. Security and [employer] present to patients room. Patient using hands to bring them on and is rolling up sleeves. EDP and Charge RN made aware.

## 2014-10-13 NOTE — Progress Notes (Signed)
Pt requested CSW to reach out to probation officer Ms. Grovespring, [phone].   Olga Coaster, LCSW  Clinical Social Work  [employer] [phone]

## 2014-10-13 NOTE — ED Notes (Signed)
MD at bedside. 

## 2014-10-13 NOTE — ED Notes (Signed)
[name] [name] with patient, patient laying on the floor.

## 2014-10-13 NOTE — ED Notes (Signed)
Pt in shower.  

## 2014-10-13 NOTE — ED Notes (Signed)
Pt was easily redirected to room after his request for cheese.  [employer] outside of room.

## 2014-10-13 NOTE — Progress Notes (Signed)
Patient ID: [name] [name], male   DOB: [birth year], 39 y.o.   MRN: [id number] Patient being seen tonight by this writer per request of Dr. Alyse Low. Upon initial conversation with patient he asks, "are you a psychistrist"? "I want to speak to the psychiatrist". He explains that he wishes to inform psychiatry of his wishes to be admitted inpatient.  Asks Clinical research associate, "Can I wear my belt when I go over there"? Why not, because I am not suicidal". Writer explained to patient that it has not been determined what the plan is in regards to his care and that this will be re-addressed and he will be evaluated by the psychiatrist in the morning. Patient states, "I am trying to fight these meds so I can be awake in the morning when the psychiatrist comes". Writer informed patient that in the event that he is asleep when the provider comes in the morning to assess him the provider will wake him and perform assessment.  "I want my wife to be there too, what time do they come?". "I want you to tell him that I want her to bet there too because we take care of each other".  Encouraged patient to lie down and attempt to get some rest.     Patient cooperates during assessment and is looking forward to addressing plan of care in the morning.

## 2014-10-13 NOTE — Progress Notes (Signed)
Pt signed consent to release information to Mrs. Malen Gauze, Engineer, drilling [phone]. Per probation officer patient does not have court today, but they were suppose to meet at 9am today. Pt was afraid of being violated of parole. Patient probation officer shares that patient has formal charges due to communicating threats to patient brother and sister inlaw due to them acting as patient payee and not giving patient money for drugs. Per probation officer patient has been increasing abusing drugs at this time. Probation officer was not aware of patient making threats towards [place]. Pt probation officer states she will be discussing patient with her supervisor and court. Pt probation officer informed of court order regarding patient having to have police escort while at [facility] ED. Pt speaking with probation officer now by phone. Pt stated to CSW while obtaining consent, "I just want to go back to jail anyway."  Olga Coaster, LCSW  Clinical Social Work  [employer] [phone]

## 2014-10-13 NOTE — ED Provider Notes (Signed)
Patient has been seen by psychiatry and they feel that he is stable to be discharged at this time. I have received his IVC.  Lorre Nick, MD 10/13/14 1352

## 2014-10-13 NOTE — ED Notes (Signed)
Pt called nurse to request Geodon medication, MD notified.

## 2014-10-13 NOTE — ED Notes (Signed)
Pt given discharge instructions and left ambulatory with [employer] and security.  Pt was in no distress.  All belongings were returned to pt.

## 2014-10-13 NOTE — ED Notes (Signed)
Multiple attempts to keep patient in bed. Unsuccessful. [employer] asked to stay in room with patient charge informed.

## 2014-10-13 NOTE — ED Notes (Signed)
Pt recently dc'd from [facility]. Pt found staggering downtown and was brought to ED.

## 2014-10-13 NOTE — BH Assessment (Signed)
[facility] Assessment Progress Note  Per Lorre Nick, MD, this pt does not require psychiatric hospitalization at this time.  He is to be released from [facility] and discharged from [facility] with recommendation to follow up with his current provider, [facility].  IVC has been rescinded.  Discharged instructions include contact information for the [facility].  Pt's nurse, Kendal Hymen, has been notified.  Doylene Canning, MA Triage Specialist [phone]

## 2014-10-13 NOTE — ED Provider Notes (Signed)
12:20 AM Patient mildly agitated. Alert and cooperative at present. He is requesting Geodon. Ordered by me  Doug Sou, MD 10/13/14 [phone]

## 2014-10-13 NOTE — ED Notes (Signed)
Patient is resting comfortably in chair, police sitter continued for protocol and for safety. Breathing WNL, no distress assessed.

## 2014-10-13 NOTE — ED Notes (Signed)
Pt refused to use a urinal. Pt was walked to the bathroom by security.

## 2014-10-13 NOTE — ED Notes (Signed)
Dr Zavitz at bedside  

## 2014-10-13 NOTE — ED Notes (Signed)
Pt was seen in ED today prior to this incident; pt left AMA and returned to ED and became unresponsive in waiting room. Pt wheeled to trauma B.
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
## 2014-10-13 NOTE — ED Notes (Signed)
Patient yelling at the nurses station due to discussion with SW who states that pt will be d/c to ACT team today but it won't be until 5-6 pm.Pt stating I will show my ass and blow up they office if they wait to come get me. I am going to jail if they make me stay.    Patient making threats to the ACT because he is not getting picked up right now.   Currently being explained the follow up plan, psych meds and adjustments.   "I am going to starve myself to death and I am not taking any more meds until I get out of here.
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
## 2014-10-13 NOTE — ED Notes (Signed)
Pt stated to this RN and tech, "I want more gpd in this room." Asked pt why. Pts response, "I'm about to tear this place up." Informed security and [employer]. Pt then got out of bed and walked out of department. No distress noted. MD updated.

## 2014-10-13 NOTE — ED Provider Notes (Signed)
CSN: [id number]     Arrival date & time 10/13/14  1846 History   First MD Initiated Contact with Patient 10/13/14 1904     Chief Complaint  Patient presents with  . Loss of Consciousness    unresponsive     (Consider location/radiation/quality/duration/timing/severity/associated sxs/prior Treatment) HPI Comments: 39 year old male with smoking and drug abuse history, homeless but currently has a place to stay, schizophrenia that takes Haldol and Geodon intermittently presents from the waiting room after agitation and syncopal episode. Patient was briefly seen in pod A, was agitated when the waiting room and then had a near-syncopal E/syncopal event. Patient has had multiple agitation/drug abuse/psychiatric visits in the past. Patient feels mild sleepy since last Haldol however alert and oriented. Patient has no suicidal ideation and passive homicidal ideation but no details or plan. Patient mitts to cocaine use.  Patient is a 39 y.o. male presenting with syncope. The history is provided by the patient and medical records.  Loss of Consciousness Associated symptoms: no chest pain, no fever, no headaches, no shortness of breath and no vomiting     Past Medical History  Diagnosis Date  . Asthma   . Homeless   . Schizophrenia   . Alcohol abuse   . Aggression    History reviewed. No pertinent past surgical history. History reviewed. No pertinent family history. Social History  Substance Use Topics  . Smoking status: Current Every Day Smoker  . Smokeless tobacco: None  . Alcohol Use: Yes    Review of Systems  Constitutional: Negative for fever and chills.  HENT: Negative for congestion.   Eyes: Negative for visual disturbance.  Respiratory: Negative for shortness of breath.   Cardiovascular: Positive for syncope. Negative for chest pain.  Gastrointestinal: Negative for vomiting and abdominal pain.  Genitourinary: Negative for dysuria and flank pain.  Musculoskeletal: Negative for  back pain, neck pain and neck stiffness.  Skin: Negative for rash.  Neurological: Negative for light-headedness and headaches.  Psychiatric/Behavioral: Positive for agitation. Negative for self-injury.      Allergies  Peach seed and Sudafed  Home Medications   Prior to Admission medications   Medication Sig Start Date End Date Taking? Authorizing Provider  gabapentin (NEURONTIN) 300 MG capsule Take 300 mg by mouth 2 (two) times daily.   Yes Historical Provider, MD  haloperidol decanoate (HALDOL DECANOATE) 100 MG/ML injection Inject 50 mg into the muscle every 14 (fourteen) days.    Yes Historical Provider, MD  lamoTRIgine (LAMICTAL) 100 MG tablet Take 100 mg by mouth 2 (two) times daily.   Yes Historical Provider, MD  divalproex (DEPAKOTE ER) 500 MG 24 hr tablet Take 2 tablets (1,000 mg total) by mouth daily. Patient not taking: Reported on 10/12/2014 04/03/12   Leata Mouse, MD   There were no vitals taken for this visit. Physical Exam  Constitutional: He is oriented to person, place, and time. He appears well-developed and well-nourished.  HENT:  Head: Normocephalic and atraumatic.  Eyes: Conjunctivae are normal. Right eye exhibits no discharge. Left eye exhibits no discharge.  Neck: Normal range of motion. Neck supple. No tracheal deviation present.  Cardiovascular: Normal rate.   Pulmonary/Chest: Effort normal.  Abdominal: Soft. He exhibits no distension. There is no tenderness. There is no guarding.  Musculoskeletal: He exhibits no edema.  Neurological: He is alert and oriented to person, place, and time. No cranial nerve deficit.  Skin: Skin is warm. No rash noted.  Psychiatric: His speech is not rapid and/or pressured. He is  not slowed. He expresses impulsivity.  Patient's mood labile initially agitated and threatening nurse and then was able to be calmed. Patient not actively suicidal or homicidal.  Nursing note and vitals reviewed.   ED Course  Procedures  (including critical care time) Labs Review Labs Reviewed  CBG MONITORING, ED    Imaging Review No results found. I have personally reviewed and evaluated these images and lab results as part of my medical decision-making.   EKG Interpretation   Date/Time:  Tuesday October 13 2014 18:56:18 EDT Ventricular Rate:  109 PR Interval:  156 QRS Duration: 90 QT Interval:  310 QTC Calculation: 417 R Axis:   28 Text Interpretation:  Sinus tachycardia LAE, consider biatrial enlargement  Anteroseptal infarct, age indeterminate Confirmed by Bibiana Gillean  MD, Robynn Marcel  ([id number]) on 10/13/2014 7:07:09 PM      MDM   Final diagnoses:  None   Patient presents from waiting room after recently being seen in the other pod. Patient had near syncopal episode, EKG reviewed similar previous. Glucose normal. I had a long discussion with the patient trying to find the best way to help his care. Initially patient was voluntary to discuss further with psychiatry for other treatment options. Police and security in the room. Verbal de-escalation was successful at times. I do feel patient has capacity make decisions she has had multiple different event similar in the past. Patient change his mind and wants to leave and smoke a cigarette. Police aware and they will also let to [employer] no as he sometimes goes back to [employer].  There were no vitals filed for this visit.    Blane Ohara, MD 10/13/14 [phone]

## 2014-10-16 DIAGNOSIS — G4733 Obstructive sleep apnea (adult) (pediatric): Secondary | ICD-10-CM | POA: Insufficient documentation

## 2014-10-16 DIAGNOSIS — R45850 Homicidal ideations: Secondary | ICD-10-CM | POA: Insufficient documentation

## 2015-02-21 ENCOUNTER — Emergency Department (HOSPITAL_COMMUNITY)
Admission: EM | Admit: 2015-02-21 | Discharge: 2015-02-22 | Discharge status: Against Medical Advice | Payer: Medicaid Other | Attending: Emergency Medicine | Admitting: Emergency Medicine

## 2015-02-21 ENCOUNTER — Encounter (HOSPITAL_COMMUNITY): Payer: Self-pay | Admitting: Emergency Medicine

## 2015-02-21 DIAGNOSIS — I10 Essential (primary) hypertension: Secondary | ICD-10-CM | POA: Insufficient documentation

## 2015-02-21 DIAGNOSIS — Z590 Homelessness: Secondary | ICD-10-CM | POA: Insufficient documentation

## 2015-02-21 DIAGNOSIS — F17200 Nicotine dependence, unspecified, uncomplicated: Secondary | ICD-10-CM | POA: Diagnosis not present

## 2015-02-21 DIAGNOSIS — R440 Auditory hallucinations: Secondary | ICD-10-CM | POA: Insufficient documentation

## 2015-02-21 DIAGNOSIS — J45909 Unspecified asthma, uncomplicated: Secondary | ICD-10-CM | POA: Diagnosis not present

## 2015-02-21 HISTORY — DX: Essential (primary) hypertension: I10

## 2015-02-21 NOTE — ED Notes (Signed)
Pt brought in by police  Pt states he is not suicidal or homicidal  Pt states [facility] would not fill his prescriptions and told him it would be Tuesday before they would  Pt states he has been off his meds for three days and is now having audible hallucinations
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
## 2015-02-22 NOTE — ED Notes (Signed)
Pt states he is leaving because "ya'll won't do nothing for me"  Pt walked out of department

## 2015-02-23 ENCOUNTER — Encounter (HOSPITAL_COMMUNITY): Payer: Self-pay | Admitting: Emergency Medicine

## 2015-02-23 ENCOUNTER — Emergency Department (HOSPITAL_COMMUNITY)
Admission: EM | Admit: 2015-02-23 | Discharge: 2015-02-23 | Disposition: A | Discharge status: Home / Self Care | Payer: Medicaid Other | Attending: Emergency Medicine | Admitting: Emergency Medicine

## 2015-02-23 ENCOUNTER — Other Ambulatory Visit: Payer: Self-pay

## 2015-02-23 DIAGNOSIS — Z590 Homelessness: Secondary | ICD-10-CM | POA: Diagnosis not present

## 2015-02-23 DIAGNOSIS — J45909 Unspecified asthma, uncomplicated: Secondary | ICD-10-CM | POA: Insufficient documentation

## 2015-02-23 DIAGNOSIS — F17200 Nicotine dependence, unspecified, uncomplicated: Secondary | ICD-10-CM | POA: Diagnosis not present

## 2015-02-23 DIAGNOSIS — R55 Syncope and collapse: Secondary | ICD-10-CM | POA: Diagnosis not present

## 2015-02-23 DIAGNOSIS — F209 Schizophrenia, unspecified: Secondary | ICD-10-CM | POA: Diagnosis not present

## 2015-02-23 DIAGNOSIS — I159 Secondary hypertension, unspecified: Secondary | ICD-10-CM | POA: Diagnosis not present

## 2015-02-23 DIAGNOSIS — I10 Essential (primary) hypertension: Secondary | ICD-10-CM | POA: Diagnosis not present

## 2015-02-23 DIAGNOSIS — R51 Headache: Secondary | ICD-10-CM | POA: Diagnosis present

## 2015-02-23 DIAGNOSIS — Z79899 Other long term (current) drug therapy: Secondary | ICD-10-CM | POA: Diagnosis not present

## 2015-02-23 LAB — BASIC METABOLIC PANEL
Anion gap: 9 (ref 5–15)
BUN: 7 mg/dL (ref 6–20)
CO2: 27 mmol/L (ref 22–32)
Calcium: 10.1 mg/dL (ref 8.9–10.3)
Chloride: 105 mmol/L (ref 101–111)
Creatinine, Ser: 1.18 mg/dL (ref 0.61–1.24)
GFR calc Af Amer: >60 mL/min (ref >60)
GFR calc non Af Amer: >60 mL/min (ref >60)
Glucose, Bld: 118 mg/dL — ABNORMAL HIGH (ref 65–99)
Potassium: 3.9 mmol/L (ref 3.5–5.1)
Sodium: 141 mmol/L (ref 135–145)

## 2015-02-23 LAB — CBC
HCT: 49.6 % (ref 39.0–52.0)
Hemoglobin: 16.6 g/dL (ref 13.0–17.0)
MCH: 30.7 pg (ref 26.0–34.0)
MCHC: 33.5 g/dL (ref 30.0–36.0)
MCV: 91.7 fL (ref 78.0–100.0)
Platelets: 231 10*3/uL (ref 150–400)
RBC: 5.41 MIL/uL (ref 4.22–5.81)
RDW: 12.6 % (ref 11.5–15.5)
WBC: 8.3 10*3/uL (ref 4.0–10.5)

## 2015-02-23 LAB — CBG MONITORING, ED: Glucose-Capillary: 103 mg/dL — ABNORMAL HIGH (ref 65–99)

## 2015-02-23 LAB — VALPROIC ACID LEVEL: Valproic Acid Lvl: <10 ug/mL — ABNORMAL LOW (ref 50.0–100.0)

## 2015-02-23 MED ORDER — IBUPROFEN 200 MG PO TABS
400.0000 mg | ORAL_TABLET | Freq: Once | ORAL | Status: AC
  Administered 2015-02-23: 400 mg via ORAL
  Filled 2015-02-23: qty 2

## 2015-02-23 NOTE — ED Notes (Signed)
[facility] and had a sycpal esposide that last about 3 mins. EMS states patient alert and oriented x4 on arrival. Patient did complain of headache and blurred vision. Patient states he has not taken his BP medication in 4 days "because it makes me pee". Patient states he has taken all other medications and denies SI/HI.

## 2015-02-23 NOTE — Discharge Instructions (Signed)
Hypertension Hypertension is another name for high blood pressure. High blood pressure forces your heart to work harder to pump blood. A blood pressure reading has two numbers, which includes a higher number over a lower number (example: 110/72). HOME CARE   Have your blood pressure rechecked by your doctor.  Only take medicine as told by your doctor. Follow the directions carefully. The medicine does not work as well if you skip doses. Skipping doses also puts you at risk for problems.  Do not smoke.  Monitor your blood pressure at home as told by your doctor. GET HELP IF:  You think you are having a reaction to the medicine you are taking.  You have repeat headaches or feel dizzy.  You have puffiness (swelling) in your ankles.  You have trouble with your vision. GET HELP RIGHT AWAY IF:   You get a very bad headache and are confused.  You feel weak, numb, or faint.  You get chest or belly (abdominal) pain.  You throw up (vomit).  You cannot breathe very well. MAKE SURE YOU:   Understand these instructions.  Will watch your condition.  Will get help right away if you are not doing well or get worse.   This information is not intended to replace advice given to you by your health care provider. Make sure you discuss any questions you have with your health care provider.   Document Released: 07/26/2007 Document Revised: 02/11/2013 Document Reviewed: 11/29/2012 Elsevier Interactive Patient Education 2016 [employer]  Syncope Syncope is a medical term for fainting or passing out. This means you lose consciousness and drop to the ground. People are generally unconscious for less than 5 minutes. You may have some muscle twitches for up to 15 seconds before waking up and returning to normal. Syncope occurs more often in older adults, but it can happen to anyone. While most causes of syncope are not dangerous, syncope can be a sign of a serious medical problem. It is important  to seek medical care.  CAUSES  Syncope is caused by a sudden drop in blood flow to the brain. The specific cause is often not determined. Factors that can bring on syncope include:  Taking medicines that lower blood pressure.  Sudden changes in posture, such as standing up quickly.  Taking more medicine than prescribed.  Standing in one place for too long.  Seizure disorders.  Dehydration and excessive exposure to heat.  Low blood sugar (hypoglycemia).  Straining to have a bowel movement.  Heart disease, irregular heartbeat, or other circulatory problems.  Fear, emotional distress, seeing blood, or severe pain. SYMPTOMS  Right before fainting, you may:  Feel dizzy or light-headed.  Feel nauseous.  See all white or all black in your field of vision.  Have cold, clammy skin. DIAGNOSIS  Your health care provider will ask about your symptoms, perform a physical exam, and perform an electrocardiogram (ECG) to record the electrical activity of your heart. Your health care provider may also perform other heart or blood tests to determine the cause of your syncope which may include:  Transthoracic echocardiogram (TTE). During echocardiography, sound waves are used to evaluate how blood flows through your heart.  Transesophageal echocardiogram (TEE).  Cardiac monitoring. This allows your health care provider to monitor your heart rate and rhythm in real time.  Holter monitor. This is a portable device that records your heartbeat and can help diagnose heart arrhythmias. It allows your health care provider to track your heart activity  for several days, if needed.  Stress tests by exercise or by giving medicine that makes the heart beat faster. TREATMENT  In most cases, no treatment is needed. Depending on the cause of your syncope, your health care provider may recommend changing or stopping some of your medicines. HOME CARE INSTRUCTIONS  Have someone stay with you until you  feel stable.  Do not drive, use machinery, or play sports until your health care provider says it is okay.  Keep all follow-up appointments as directed by your health care provider.  Lie down right away if you start feeling like you might faint. Breathe deeply and steadily. Wait until all the symptoms have passed.  Drink enough fluids to keep your urine clear or pale yellow.  If you are taking blood pressure or heart medicine, get up slowly and take several minutes to sit and then stand. This can reduce dizziness. SEEK IMMEDIATE MEDICAL CARE IF:   You have a severe headache.  You have unusual pain in the chest, abdomen, or back.  You are bleeding from your mouth or rectum, or you have black or tarry stool.  You have an irregular or very fast heartbeat.  You have pain with breathing.  You have repeated fainting or seizure-like jerking during an episode.  You faint when sitting or lying down.  You have confusion.  You have trouble walking.  You have severe weakness.  You have vision problems. If you fainted, call your local emergency services (911 in [place] Do not drive yourself to the hospital.    This information is not intended to replace advice given to you by your health care provider. Make sure you discuss any questions you have with your health care provider.   Document Released: 02/06/2005 Document Revised: 06/23/2014 Document Reviewed: 04/07/2011 Elsevier Interactive Patient Education [employer]

## 2015-02-23 NOTE — ED Provider Notes (Signed)
CSN: [id number]     Arrival date & time 02/23/15  1454 History   First MD Initiated Contact with Patient 02/23/15 1508     Chief Complaint  Patient presents with  . Hypertension     (Consider location/radiation/quality/duration/timing/severity/associated sxs/prior Treatment) HPI   [name] [name] is a 40 y.o. male who states that his blood pressure has been high several times when [facility]. Also, today he passed out while there for 3 minutes. He states he stopped taking his blood pressure medication, hydrochlorothiazide, one week ago because he didn't like how often it made him urinate. He is now committed to restarting the hydrochlorothiazide. He has a mild headache at this time. He denies neck pain, back pain, chest pain, nausea, vomiting, weakness or dizziness. There are no other known modifying factors.   Past Medical History  Diagnosis Date  . Asthma   . Homeless   . Schizophrenia (HCC)   . Alcohol abuse   . Aggression   . Hypertension    Past Surgical History  Procedure Laterality Date  . Knee surgery    . Mouth surgery     No family history on file. Social History  Substance Use Topics  . Smoking status: Current Every Day Smoker  . Smokeless tobacco: None  . Alcohol Use: Yes    Review of Systems  All other systems reviewed and are negative.     Allergies  Peach seed and Sudafed  Home Medications   Prior to Admission medications   Medication Sig Start Date End Date Taking? Authorizing Provider  cetirizine (ZYRTEC) 10 MG tablet Take 10 mg by mouth daily. 01/19/15  Yes Historical Provider, MD  chlorproMAZINE (THORAZINE) 100 MG tablet Take 1 tablet by mouth at bedtime. 01/21/15  Yes Historical Provider, MD  chlorproMAZINE (THORAZINE) 50 MG tablet Take 50 mg by mouth every morning. 01/21/15  Yes Historical Provider, MD  hydrochlorothiazide (HYDRODIURIL) 12.5 MG tablet Take 12.5 mg by mouth daily. 01/22/15  Yes Historical  Provider, MD  lithium 300 MG tablet Take 1,200 mg by mouth at bedtime. 01/21/15  Yes Historical Provider, MD  LORazepam (ATIVAN) 1 MG tablet Take 1 tablet by mouth every 8 (eight) hours as needed. As needed for anxiety 11/20/14  Yes Historical Provider, MD  oxcarbazepine (TRILEPTAL) 600 MG tablet Take 600 mg by mouth 2 (two) times daily. 01/21/15  Yes Historical Provider, MD  traZODone (DESYREL) 100 MG tablet Take 100 mg by mouth at bedtime. 01/19/15  Yes Historical Provider, MD  haloperidol decanoate (HALDOL DECANOATE) 100 MG/ML injection Inject 50 mg into the muscle every 14 (fourteen) days.     Historical Provider, MD   BP 149/102 mmHg  Pulse 93  Temp(Src) 98.9 F (37.2 C) (Oral)  Resp 13  SpO2 97% Physical Exam  Constitutional: He is oriented to person, place, and time. He appears well-developed and well-nourished. No distress.  HENT:  Head: Normocephalic and atraumatic.  Right Ear: External ear normal.  Left Ear: External ear normal.  No visible injury to head or face.  Eyes: Conjunctivae and EOM are normal. Pupils are equal, round, and reactive to light.  Neck: Normal range of motion and phonation normal. Neck supple.  Cardiovascular: Normal rate, regular rhythm and normal heart sounds.   Pulmonary/Chest: Effort normal and breath sounds normal. He exhibits no bony tenderness.  Abdominal: Soft. There is no tenderness.  Musculoskeletal: Normal range of motion. He exhibits no tenderness.  Was all extremities well without limitation.  Normal gait.  Neurological: He is alert and oriented to person, place, and time. No cranial nerve deficit or sensory deficit. He exhibits normal muscle tone. Coordination normal.  No dysarthria and aphasia or nystagmus  Skin: Skin is warm, dry and intact.  Psychiatric: He has a normal mood and affect. His behavior is normal. Judgment and thought content normal.  Nursing note and vitals reviewed.   ED Course  Procedures (including critical care  time)  Medications  ibuprofen (ADVIL,MOTRIN) tablet 400 mg (not administered)    Patient Vitals for the past 24 hrs:  BP Temp Temp src Pulse Resp SpO2  02/23/15 1502 (!) 149/102 mmHg 98.9 F (37.2 C) Oral 93 13 97 %    4:11 PM Reevaluation with update and discussion. After initial assessment and treatment, an updated evaluation reveals findings discussed with patient, all questions answered.Mancel Bale L    Labs Review Labs Reviewed  BASIC METABOLIC PANEL - Abnormal; Notable for the following:    Glucose, Bld 118 (*)    All other components within normal limits  CBG MONITORING, ED - Abnormal; Notable for the following:    Glucose-Capillary 103 (*)    All other components within normal limits  CBC  URINALYSIS, ROUTINE W REFLEX MICROSCOPIC (NOT AT [facility])  VALPROIC ACID LEVEL    Imaging Review No results found. I have personally reviewed and evaluated these images and lab results as part of my medical decision-making.   EKG Interpretation   Date/Time:  Tuesday February 23 2015 14:53:06 EST Ventricular Rate:  83 PR Interval:  174 QRS Duration: 103 QT Interval:  388 QTC Calculation: 456 R Axis:   4 Text Interpretation:  Sinus rhythm Probable left atrial enlargement  Anteroseptal infarct, age indeterminate since last tracing no significant  change Confirmed by Effie Shy  MD, Betheny Suchecki [phone]) on 02/23/2015 3:24:37 PM      MDM   Final diagnoses:  Secondary hypertension, unspecified  Syncope, unspecified syncope type    Hypertension secondary to noncompliance with treatment. Syncope, without injury or abnormal findings on evaluation. Doubt ACS, PE, pneumonia, or metabolic instability.   Nursing Notes Reviewed/ Care Coordinated Applicable Imaging Reviewed Interpretation of Laboratory Data incorporated into ED treatment  The patient appears reasonably screened and/or stabilized for discharge and I doubt any other medical condition or other [facility] requiring further  screening, evaluation, or treatment in the ED at this time prior to discharge.  Plan: Home Medications- usual; Home Treatments- rest; return here if the recommended treatment, does not improve the symptoms; Recommended follow up- PCP 1 week     Mancel Bale, MD 02/23/15 1736

## 2015-03-29 ENCOUNTER — Emergency Department (HOSPITAL_COMMUNITY)
Admission: EM | Admit: 2015-03-29 | Discharge: 2015-03-29 | Discharge status: Against Medical Advice | Payer: Medicaid Other | Attending: Emergency Medicine | Admitting: Emergency Medicine

## 2015-03-29 ENCOUNTER — Emergency Department (HOSPITAL_COMMUNITY): Payer: Medicaid Other

## 2015-03-29 DIAGNOSIS — Z79899 Other long term (current) drug therapy: Secondary | ICD-10-CM | POA: Diagnosis not present

## 2015-03-29 DIAGNOSIS — Y9289 Other specified places as the place of occurrence of the external cause: Secondary | ICD-10-CM | POA: Diagnosis not present

## 2015-03-29 DIAGNOSIS — S0081XA Abrasion of other part of head, initial encounter: Secondary | ICD-10-CM | POA: Diagnosis not present

## 2015-03-29 DIAGNOSIS — S0990XA Unspecified injury of head, initial encounter: Secondary | ICD-10-CM

## 2015-03-29 DIAGNOSIS — Y9389 Activity, other specified: Secondary | ICD-10-CM | POA: Insufficient documentation

## 2015-03-29 DIAGNOSIS — S4991XA Unspecified injury of right shoulder and upper arm, initial encounter: Secondary | ICD-10-CM | POA: Insufficient documentation

## 2015-03-29 DIAGNOSIS — S29001A Unspecified injury of muscle and tendon of front wall of thorax, initial encounter: Secondary | ICD-10-CM | POA: Diagnosis not present

## 2015-03-29 DIAGNOSIS — Y998 Other external cause status: Secondary | ICD-10-CM | POA: Insufficient documentation

## 2015-03-29 DIAGNOSIS — W1839XA Other fall on same level, initial encounter: Secondary | ICD-10-CM | POA: Insufficient documentation

## 2015-03-29 DIAGNOSIS — R55 Syncope and collapse: Secondary | ICD-10-CM

## 2015-03-29 DIAGNOSIS — Z590 Homelessness: Secondary | ICD-10-CM | POA: Diagnosis not present

## 2015-03-29 DIAGNOSIS — I10 Essential (primary) hypertension: Secondary | ICD-10-CM | POA: Insufficient documentation

## 2015-03-29 DIAGNOSIS — J45909 Unspecified asthma, uncomplicated: Secondary | ICD-10-CM | POA: Insufficient documentation

## 2015-03-29 DIAGNOSIS — S0083XA Contusion of other part of head, initial encounter: Secondary | ICD-10-CM | POA: Diagnosis not present

## 2015-03-29 DIAGNOSIS — F209 Schizophrenia, unspecified: Secondary | ICD-10-CM | POA: Diagnosis not present

## 2015-03-29 DIAGNOSIS — F17200 Nicotine dependence, unspecified, uncomplicated: Secondary | ICD-10-CM | POA: Diagnosis not present

## 2015-03-29 LAB — CBC WITH DIFFERENTIAL/PLATELET
Basophils Absolute: 0 10*3/uL (ref 0.0–0.1)
Basophils Relative: 0 %
Eosinophils Absolute: 0.1 10*3/uL (ref 0.0–0.7)
Eosinophils Relative: 1 %
HCT: 47.2 % (ref 39.0–52.0)
Hemoglobin: 15.6 g/dL (ref 13.0–17.0)
Lymphocytes Relative: 26 %
Lymphs Abs: 1.6 10*3/uL (ref 0.7–4.0)
MCH: 30.6 pg (ref 26.0–34.0)
MCHC: 33.1 g/dL (ref 30.0–36.0)
MCV: 92.7 fL (ref 78.0–100.0)
Monocytes Absolute: 0.6 10*3/uL (ref 0.1–1.0)
Monocytes Relative: 9 %
Neutro Abs: 3.9 10*3/uL (ref 1.7–7.7)
Neutrophils Relative %: 64 %
Platelets: 187 10*3/uL (ref 150–400)
RBC: 5.09 MIL/uL (ref 4.22–5.81)
RDW: 12.2 % (ref 11.5–15.5)
WBC: 6.2 10*3/uL (ref 4.0–10.5)

## 2015-03-29 LAB — COMPREHENSIVE METABOLIC PANEL
ALT: 30 U/L (ref 17–63)
AST: 20 U/L (ref 15–41)
Albumin: 3.9 g/dL (ref 3.5–5.0)
Alkaline Phosphatase: 94 U/L (ref 38–126)
Anion gap: 11 (ref 5–15)
BUN: 7 mg/dL (ref 6–20)
CO2: 28 mmol/L (ref 22–32)
Calcium: 9.6 mg/dL (ref 8.9–10.3)
Chloride: 104 mmol/L (ref 101–111)
Creatinine, Ser: 1.2 mg/dL (ref 0.61–1.24)
GFR calc Af Amer: >60 mL/min (ref >60)
GFR calc non Af Amer: >60 mL/min (ref >60)
Glucose, Bld: 88 mg/dL (ref 65–99)
Potassium: 4 mmol/L (ref 3.5–5.1)
Sodium: 143 mmol/L (ref 135–145)
Total Bilirubin: 0.6 mg/dL (ref 0.3–1.2)
Total Protein: 6.3 g/dL — ABNORMAL LOW (ref 6.5–8.1)

## 2015-03-29 LAB — ETHANOL: Alcohol, Ethyl (B): <5 mg/dL (ref <5)

## 2015-03-29 NOTE — ED Notes (Signed)
Pt refuses to stay and is walking out.  Unable to hold d/t fact that pt is not ivc'd.  MD notified.

## 2015-03-29 NOTE — ED Notes (Addendum)
Pt here via [facility] from [place].  EMS not sure why pt was there, but pt was in waiting room of [facility] waiting to be triaged.  RN's stated pt lost consciousness while sitting and did not respond to sternal rub.  When EMS arrived, pt was awake and could recall everything.  EKG unremarkable.  Pt refused tx, walked back up hill to [place], went into waiting room and lost consciousness again in front of nurses. When EMS arrived pt was ao x 4 and wondering why he had hematoma to R forehead. Pt c/o sternal chest pain post fall.  CBG 107.

## 2015-03-29 NOTE — ED Notes (Signed)
Pt is becoming agitated and wanting to leave.  Pt d/c IV.  MD made aware and security called to bedside.

## 2015-03-29 NOTE — ED Notes (Signed)
Pt requesting pain meds for increasing abdominal and chest pain.

## 2015-03-29 NOTE — ED Provider Notes (Signed)
CSN: [id number]     Arrival date & time 03/29/15  1446 History   First MD Initiated Contact with Patient 03/29/15 1500     Chief Complaint  Patient presents with  . Near Syncope     (Consider location/radiation/quality/duration/timing/severity/associated sxs/prior Treatment) HPI Comments: 40yo M w/ PMH including HTN, alcohol abuse, schizophrenia, asthma who p/w syncope. History obtained with the assistance of EMS. According to EMS, the patient was in the waiting room of [facility] awaiting triage when he reportedly lost consciousness while sitting and was unresponsive to sternal rub for a few minutes. He was awake and alert on EMS arrival and initially refused treatment. He walked back up a hill to [place] and lost consciousness again in front of several nurses in the waiting room. When EMS arrived again, he was awake and alert, with hematoma on his forehead but unsure of the events.  The patient himself does not recall events surrounding syncopal episodes but he denies any presyncopal feeling. He reports some vomiting a few days ago but states that this resolved. No fevers or cough/cold symptoms. He states he has intermittent blurry vision but denies any currently. He reports some central chest tenderness since the fall that was not present prior to his syncopal events. He also reports some right upper arm pain. No loss of bowel or bladder, no tongue biting.  Patient is a 40 y.o. male presenting with near-syncope. The history is provided by the patient and the EMS personnel.  Near Syncope    Past Medical History  Diagnosis Date  . Asthma   . Homeless   . Schizophrenia (HCC)   . Alcohol abuse   . Aggression   . Hypertension    Past Surgical History  Procedure Laterality Date  . Knee surgery    . Mouth surgery     No family history on file. Social History  Substance Use Topics  . Smoking status: Current Every Day Smoker  . Smokeless tobacco: Not on file  . Alcohol Use: Yes     Review of Systems  Cardiovascular: Positive for near-syncope.    10 Systems reviewed and are negative for acute change except as noted in the HPI.   Allergies  Peach seed and Sudafed  Home Medications   Prior to Admission medications   Medication Sig Start Date End Date Taking? Authorizing Provider  amLODipine (NORVASC) 5 MG tablet Take 5 mg by mouth daily.   Yes Historical Provider, MD  cetirizine (ZYRTEC) 10 MG tablet Take 10 mg by mouth daily. 01/19/15  Yes Historical Provider, MD  chlorproMAZINE (THORAZINE) 100 MG tablet Take 300 mg by mouth at bedtime.   Yes Historical Provider, MD  chlorproMAZINE (THORAZINE) 25 MG tablet Take 25 mg by mouth 3 (three) times daily as needed (for agitation).   Yes Historical Provider, MD  chlorproMAZINE (THORAZINE) 50 MG tablet Take 50 mg by mouth every morning. 01/21/15  Yes Historical Provider, MD  chlorproMAZINE (THORAZINE) 50 MG tablet Take 50 mg by mouth every morning.   Yes Historical Provider, MD  haloperidol decanoate (HALDOL DECANOATE) 100 MG/ML injection Inject 150 mg into the muscle every 28 (twenty-eight) days.    Yes Historical Provider, MD  lithium 300 MG tablet Take 1,200 mg by mouth at bedtime. 01/21/15  Yes Historical Provider, MD  LORazepam (ATIVAN) 1 MG tablet Take 1 tablet by mouth every 8 (eight) hours as needed. As needed for anxiety 11/20/14  Yes Historical Provider, MD  oxcarbazepine (TRILEPTAL) 600 MG tablet Take 600 mg  by mouth 2 (two) times daily. 01/21/15  Yes Historical Provider, MD  traZODone (DESYREL) 100 MG tablet Take 100 mg by mouth at bedtime. 01/19/15  Yes Historical Provider, MD   BP 139/73 mmHg  Pulse 92  Temp(Src) 98.8 F (37.1 C) (Oral)  Resp 16  SpO2 100% Physical Exam  Constitutional: He is oriented to person, place, and time. He appears well-developed and well-nourished. No distress.  HENT:  Head: Normocephalic.  Small hematoma and abrasion R forehead lateral to R eyebrow; Moist mucous membranes   Eyes: Conjunctivae and EOM are normal. Pupils are equal, round, and reactive to light.  Neck:  c-collar in place  Cardiovascular: Normal rate, regular rhythm and normal heart sounds.   No murmur heard. Pulmonary/Chest: Effort normal and breath sounds normal.  TTP sternum and L lateral chest w/ no obvious deformity or crepitus  Abdominal: Soft. Bowel sounds are normal. He exhibits no distension. There is no tenderness.  Musculoskeletal: Normal range of motion. He exhibits no edema or tenderness.  Neurological: He is alert and oriented to person, place, and time. No cranial nerve deficit. He exhibits normal muscle tone.  Fluent speech  Skin: Skin is warm and dry.  Psychiatric:  Bizarre affect, poor historian  Nursing note and vitals reviewed.   ED Course  Procedures (including critical care time) Labs Review Labs Reviewed  COMPREHENSIVE METABOLIC PANEL - Abnormal; Notable for the following:    Total Protein 6.3 (*)    All other components within normal limits  ETHANOL  CBC WITH DIFFERENTIAL/PLATELET    Imaging Review Dg Chest 2 View  03/29/2015  CLINICAL DATA:  Chest pain after losing consciousness today and falling. History of asthma and hypertension. EXAM: CHEST  2 VIEW COMPARISON:  09/02/2014 and 04/22/2014. FINDINGS: The heart size and mediastinal contours are stable. There are chronic low lung volumes without evidence of airspace disease, edema or significant pleural effusion. The bones appear unchanged. IMPRESSION: Stable chest.  No acute cardiopulmonary process. Electronically Signed   By: Carey Bullocks M.D.   On: 03/29/2015 15:34   Ct Head Wo Contrast  03/29/2015  CLINICAL DATA:  Syncope with fall EXAM: CT HEAD WITHOUT CONTRAST CT CERVICAL SPINE WITHOUT CONTRAST TECHNIQUE: Multidetector CT imaging of the head and cervical spine was performed following the standard protocol without intravenous contrast. Multiplanar CT image reconstructions of the cervical spine were also  generated. COMPARISON:  Cervical spine CT April 03, 2009; head CT April 29, 2014 FINDINGS: CT HEAD FINDINGS The ventricles are normal in size and configuration. There is no intracranial mass, hemorrhage, extra-axial fluid collection, or midline shift. The gray-white compartments appear normal. No acute infarct evident. The bony calvarium appears intact. The mastoid air cells are clear. No intraorbital lesions are identified. CT CERVICAL SPINE FINDINGS There is no fracture or spondylolisthesis. Prevertebral soft tissues and predental space regions are normal. Disc spaces appear normal. There is no nerve root edema or effacement. No disc extrusion or stenosis. Thyroid appears prominent in a generalized manner, stable. No focal thyroid lesion is apparent. IMPRESSION: CT head:  Study within normal limits. CT cervical spine: No fracture or spondylolisthesis. No apparent arthropathy. Thyroid is prominent in a generalized manner without focal lesion, stable in appearance compared to prior study from 2011. Electronically Signed   By: Bretta Bang III M.D.   On: 03/29/2015 16:03   Ct Cervical Spine Wo Contrast  03/29/2015  CLINICAL DATA:  Syncope with fall EXAM: CT HEAD WITHOUT CONTRAST CT CERVICAL SPINE WITHOUT CONTRAST TECHNIQUE:  Multidetector CT imaging of the head and cervical spine was performed following the standard protocol without intravenous contrast. Multiplanar CT image reconstructions of the cervical spine were also generated. COMPARISON:  Cervical spine CT April 03, 2009; head CT April 29, 2014 FINDINGS: CT HEAD FINDINGS The ventricles are normal in size and configuration. There is no intracranial mass, hemorrhage, extra-axial fluid collection, or midline shift. The gray-white compartments appear normal. No acute infarct evident. The bony calvarium appears intact. The mastoid air cells are clear. No intraorbital lesions are identified. CT CERVICAL SPINE FINDINGS There is no fracture or  spondylolisthesis. Prevertebral soft tissues and predental space regions are normal. Disc spaces appear normal. There is no nerve root edema or effacement. No disc extrusion or stenosis. Thyroid appears prominent in a generalized manner, stable. No focal thyroid lesion is apparent. IMPRESSION: CT head:  Study within normal limits. CT cervical spine: No fracture or spondylolisthesis. No apparent arthropathy. Thyroid is prominent in a generalized manner without focal lesion, stable in appearance compared to prior study from 2011. Electronically Signed   By: Bretta Bang III M.D.   On: 03/29/2015 16:03   I have personally reviewed and evaluated these lab results as part of my medical decision-making.   EKG Interpretation   Date/Time:  Monday March 29 2015 15:02:33 EST Ventricular Rate:  86 PR Interval:  170 QRS Duration: 89 QT Interval:  360 QTC Calculation: 430 R Axis:   4 Text Interpretation:  Sinus rhythm ST elev, probable normal early repol  pattern No significant change since last tracing Confirmed by LITTLE MD,  RACHEL ([id number]) on 03/30/2015 12:29:34 AM      MDM   Final diagnoses:  Syncope and collapse  Minor head injury, initial encounter    Pt presents from [facility] for 2 witnessed syncopal episodes that occurred just prior to arrival. Patient does not recall events surrounding episodes. On exam, he was awake and alert. Vital signs stable. Abrasion and small hematoma on right forehead that no other signs of injury. EKG shows sinus rhythm with no evidence of WPW, QT prolongation, or Brugada. Obtained above lab work as well as CT head and neck, chest x-ray to rule out injury.  He was unsure of tetanus status but refused booster and wanted to check with his mother regarding his vaccination status. Lab work was unremarkable and CTs as well as chest x-ray showed no acute injuries. Patient was pacing around the room and eventually security was called over to keep him calm. Security  eventually left him in his room and before I was able to review his results, the patient eloped.   Laurence Spates, MD 03/30/15 [phone]

## 2015-04-04 DIAGNOSIS — F1910 Other psychoactive substance abuse, uncomplicated: Secondary | ICD-10-CM | POA: Insufficient documentation

## 2015-04-04 DIAGNOSIS — Z72811 Adult antisocial behavior: Secondary | ICD-10-CM | POA: Insufficient documentation

## 2015-04-07 ENCOUNTER — Encounter (HOSPITAL_COMMUNITY): Payer: Self-pay | Admitting: Emergency Medicine

## 2015-04-07 ENCOUNTER — Emergency Department (HOSPITAL_COMMUNITY): Payer: Medicaid Other

## 2015-04-07 ENCOUNTER — Emergency Department (HOSPITAL_COMMUNITY)
Admission: EM | Admit: 2015-04-07 | Discharge: 2015-04-07 | Disposition: A | Discharge status: Home / Self Care | Payer: Medicaid Other | Attending: Emergency Medicine | Admitting: Emergency Medicine

## 2015-04-07 DIAGNOSIS — R0789 Other chest pain: Secondary | ICD-10-CM | POA: Diagnosis not present

## 2015-04-07 DIAGNOSIS — F209 Schizophrenia, unspecified: Secondary | ICD-10-CM | POA: Insufficient documentation

## 2015-04-07 DIAGNOSIS — J45901 Unspecified asthma with (acute) exacerbation: Secondary | ICD-10-CM | POA: Diagnosis not present

## 2015-04-07 DIAGNOSIS — Z590 Homelessness: Secondary | ICD-10-CM | POA: Insufficient documentation

## 2015-04-07 DIAGNOSIS — I10 Essential (primary) hypertension: Secondary | ICD-10-CM | POA: Diagnosis not present

## 2015-04-07 DIAGNOSIS — Z79899 Other long term (current) drug therapy: Secondary | ICD-10-CM | POA: Diagnosis not present

## 2015-04-07 DIAGNOSIS — F17200 Nicotine dependence, unspecified, uncomplicated: Secondary | ICD-10-CM | POA: Diagnosis not present

## 2015-04-07 DIAGNOSIS — F329 Major depressive disorder, single episode, unspecified: Secondary | ICD-10-CM | POA: Insufficient documentation

## 2015-04-07 DIAGNOSIS — I159 Secondary hypertension, unspecified: Secondary | ICD-10-CM | POA: Diagnosis not present

## 2015-04-07 DIAGNOSIS — R079 Chest pain, unspecified: Secondary | ICD-10-CM | POA: Diagnosis present

## 2015-04-07 LAB — CBC
HCT: 44.8 % (ref 39.0–52.0)
Hemoglobin: 14.8 g/dL (ref 13.0–17.0)
MCH: 30.3 pg (ref 26.0–34.0)
MCHC: 33 g/dL (ref 30.0–36.0)
MCV: 91.6 fL (ref 78.0–100.0)
Platelets: 174 10*3/uL (ref 150–400)
RBC: 4.89 MIL/uL (ref 4.22–5.81)
RDW: 12.2 % (ref 11.5–15.5)
WBC: 7.8 10*3/uL (ref 4.0–10.5)

## 2015-04-07 LAB — BASIC METABOLIC PANEL
Anion gap: 10 (ref 5–15)
BUN: 10 mg/dL (ref 6–20)
CO2: 27 mmol/L (ref 22–32)
Calcium: 10 mg/dL (ref 8.9–10.3)
Chloride: 106 mmol/L (ref 101–111)
Creatinine, Ser: 1.32 mg/dL — ABNORMAL HIGH (ref 0.61–1.24)
GFR calc Af Amer: >60 mL/min (ref >60)
GFR calc non Af Amer: >60 mL/min (ref >60)
Glucose, Bld: 95 mg/dL (ref 65–99)
Potassium: 3.4 mmol/L — ABNORMAL LOW (ref 3.5–5.1)
Sodium: 143 mmol/L (ref 135–145)

## 2015-04-07 LAB — I-STAT TROPONIN, ED: Troponin i, poc: 0 ng/mL (ref 0.00–0.08)

## 2015-04-07 MED ORDER — BUSPIRONE HCL 15 MG PO TABS: 15.0000 mg | ORAL_TABLET | Freq: Three times a day (TID) | ORAL | Status: DC

## 2015-04-07 MED ORDER — ARIPIPRAZOLE 10 MG PO TABS: 10.0000 mg | ORAL_TABLET | Freq: Every day | ORAL | Status: DC

## 2015-04-07 MED ORDER — DIVALPROEX SODIUM ER 500 MG PO TB24
500.0000 mg | ORAL_TABLET | Freq: Every day | ORAL | Status: DC
Start: 2015-04-07 — End: 2015-04-29

## 2015-04-07 MED ORDER — BENZTROPINE MESYLATE 1 MG PO TABS: 1.0000 mg | ORAL_TABLET | Freq: Every day | ORAL | Status: DC

## 2015-04-07 MED ORDER — LITHIUM CARBONATE 300 MG PO TABS: 1200.0000 mg | ORAL_TABLET | Freq: Every day | ORAL | Status: DC

## 2015-04-07 NOTE — ED Notes (Signed)
Patient is alert and orientedx4.  Patient was explained discharge instructions and they understood them with no questions.   

## 2015-04-07 NOTE — ED Notes (Signed)
Patient transported to X-ray 

## 2015-04-07 NOTE — ED Notes (Signed)
Pt. reports left chest pain with SOB onset yesterday , denies nausea or diaphoresis , received 4 baby ASA by EMS prior to arrival .
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
## 2015-04-07 NOTE — Care Management (Signed)
CM . Spoke with Marily Memos at [facility] will waive $3 co-pay. Prescription faxed in to [facility] on [address]

## 2015-04-15 ENCOUNTER — Emergency Department (HOSPITAL_COMMUNITY)
Admission: EM | Admit: 2015-04-15 | Discharge: 2015-04-16 | Disposition: A | Discharge status: Home / Self Care | Payer: Medicaid Other | Attending: Emergency Medicine | Admitting: Emergency Medicine

## 2015-04-15 ENCOUNTER — Encounter (HOSPITAL_COMMUNITY): Payer: Self-pay | Admitting: *Deleted

## 2015-04-15 DIAGNOSIS — F203 Undifferentiated schizophrenia: Secondary | ICD-10-CM

## 2015-04-15 DIAGNOSIS — F1210 Cannabis abuse, uncomplicated: Secondary | ICD-10-CM | POA: Diagnosis not present

## 2015-04-15 DIAGNOSIS — F419 Anxiety disorder, unspecified: Secondary | ICD-10-CM | POA: Insufficient documentation

## 2015-04-15 DIAGNOSIS — R456 Violent behavior: Secondary | ICD-10-CM | POA: Diagnosis not present

## 2015-04-15 DIAGNOSIS — I10 Essential (primary) hypertension: Secondary | ICD-10-CM | POA: Diagnosis not present

## 2015-04-15 DIAGNOSIS — Z79899 Other long term (current) drug therapy: Secondary | ICD-10-CM | POA: Insufficient documentation

## 2015-04-15 DIAGNOSIS — J45909 Unspecified asthma, uncomplicated: Secondary | ICD-10-CM | POA: Diagnosis not present

## 2015-04-15 DIAGNOSIS — F17200 Nicotine dependence, unspecified, uncomplicated: Secondary | ICD-10-CM | POA: Diagnosis not present

## 2015-04-15 DIAGNOSIS — Z590 Homelessness: Secondary | ICD-10-CM | POA: Insufficient documentation

## 2015-04-15 DIAGNOSIS — R451 Restlessness and agitation: Secondary | ICD-10-CM | POA: Insufficient documentation

## 2015-04-15 DIAGNOSIS — F209 Schizophrenia, unspecified: Secondary | ICD-10-CM | POA: Diagnosis present

## 2015-04-15 DIAGNOSIS — R45850 Homicidal ideations: Secondary | ICD-10-CM | POA: Insufficient documentation

## 2015-04-15 DIAGNOSIS — R443 Hallucinations, unspecified: Secondary | ICD-10-CM | POA: Diagnosis present

## 2015-04-15 LAB — COMPREHENSIVE METABOLIC PANEL
ALT: 18 U/L (ref 17–63)
AST: 27 U/L (ref 15–41)
Albumin: 3.8 g/dL (ref 3.5–5.0)
Alkaline Phosphatase: 79 U/L (ref 38–126)
Anion gap: 10 (ref 5–15)
BUN: 7 mg/dL (ref 6–20)
CO2: 25 mmol/L (ref 22–32)
Calcium: 9.9 mg/dL (ref 8.9–10.3)
Chloride: 112 mmol/L — ABNORMAL HIGH (ref 101–111)
Creatinine, Ser: 1.19 mg/dL (ref 0.61–1.24)
GFR calc Af Amer: >60 mL/min (ref >60)
GFR calc non Af Amer: >60 mL/min (ref >60)
Glucose, Bld: 69 mg/dL (ref 65–99)
Potassium: 4.4 mmol/L (ref 3.5–5.1)
Sodium: 147 mmol/L — ABNORMAL HIGH (ref 135–145)
Total Bilirubin: 0.2 mg/dL — ABNORMAL LOW (ref 0.3–1.2)
Total Protein: 6.5 g/dL (ref 6.5–8.1)

## 2015-04-15 LAB — CBC WITH DIFFERENTIAL/PLATELET
Basophils Absolute: 0 10*3/uL (ref 0.0–0.1)
Basophils Relative: 0 %
Eosinophils Absolute: 0.2 10*3/uL (ref 0.0–0.7)
Eosinophils Relative: 3 %
HCT: 48.4 % (ref 39.0–52.0)
Hemoglobin: 15.6 g/dL (ref 13.0–17.0)
Lymphocytes Relative: 28 %
Lymphs Abs: 2.5 10*3/uL (ref 0.7–4.0)
MCH: 30.4 pg (ref 26.0–34.0)
MCHC: 32.2 g/dL (ref 30.0–36.0)
MCV: 94.3 fL (ref 78.0–100.0)
Monocytes Absolute: 0.7 10*3/uL (ref 0.1–1.0)
Monocytes Relative: 8 %
Neutro Abs: 5.3 10*3/uL (ref 1.7–7.7)
Neutrophils Relative %: 61 %
Platelets: 187 10*3/uL (ref 150–400)
RBC: 5.13 MIL/uL (ref 4.22–5.81)
RDW: 12.6 % (ref 11.5–15.5)
WBC: 8.7 10*3/uL (ref 4.0–10.5)

## 2015-04-15 LAB — RAPID URINE DRUG SCREEN, HOSP PERFORMED
Amphetamines: NOT DETECTED
Barbiturates: NOT DETECTED
Benzodiazepines: NOT DETECTED
Cocaine: NOT DETECTED
Opiates: NOT DETECTED
Tetrahydrocannabinol: POSITIVE — AB

## 2015-04-15 LAB — ETHANOL: Alcohol, Ethyl (B): <5 mg/dL (ref <5)

## 2015-04-15 LAB — LITHIUM LEVEL: Lithium Lvl: 0.19 mmol/L — ABNORMAL LOW (ref 0.60–1.20)

## 2015-04-15 MED ORDER — STERILE WATER FOR INJECTION IJ SOLN
INTRAMUSCULAR | Status: AC
  Filled 2015-04-15: qty 10

## 2015-04-15 MED ORDER — ARIPIPRAZOLE 10 MG PO TABS
10.0000 mg | ORAL_TABLET | Freq: Every day | ORAL | Status: DC
Start: 2015-04-15 — End: 2015-04-16
  Administered 2015-04-15: 10 mg via ORAL
  Filled 2015-04-15: qty 1

## 2015-04-15 MED ORDER — ZIPRASIDONE MESYLATE 20 MG IM SOLR
20.0000 mg | Freq: Four times a day (QID) | INTRAMUSCULAR | Status: DC | PRN
  Filled 2015-04-15: qty 20

## 2015-04-15 MED ORDER — LORAZEPAM 2 MG/ML IJ SOLN
2.0000 mg | INTRAMUSCULAR | Status: DC | PRN
  Administered 2015-04-15: 2 mg via INTRAMUSCULAR
  Filled 2015-04-15: qty 1

## 2015-04-15 MED ORDER — LORAZEPAM 1 MG PO TABS
2.0000 mg | ORAL_TABLET | ORAL | Status: DC | PRN
  Administered 2015-04-16: 2 mg via ORAL
  Filled 2015-04-15: qty 2

## 2015-04-15 MED ORDER — BUSPIRONE HCL 10 MG PO TABS
15.0000 mg | ORAL_TABLET | Freq: Three times a day (TID) | ORAL | Status: DC
  Administered 2015-04-15 – 2015-04-16 (×2): 15 mg via ORAL
  Filled 2015-04-15 (×2): qty 2

## 2015-04-15 MED ORDER — STERILE WATER FOR INJECTION IJ SOLN
INTRAMUSCULAR | Status: AC
  Administered 2015-04-15: 10 mL
  Filled 2015-04-15: qty 10

## 2015-04-15 MED ORDER — LITHIUM CARBONATE 300 MG PO CAPS
1200.0000 mg | ORAL_CAPSULE | Freq: Every day | ORAL | Status: DC
  Filled 2015-04-15: qty 4

## 2015-04-15 MED ORDER — LORAZEPAM 2 MG/ML IJ SOLN
INTRAMUSCULAR | Status: AC
  Administered 2015-04-15: 2 mg
  Filled 2015-04-15: qty 1

## 2015-04-15 MED ORDER — AMLODIPINE BESYLATE 5 MG PO TABS: 5.0000 mg | ORAL_TABLET | Freq: Every day | ORAL | Status: DC

## 2015-04-15 MED ORDER — BENZTROPINE MESYLATE 1 MG PO TABS
1.0000 mg | ORAL_TABLET | Freq: Every day | ORAL | Status: DC
  Administered 2015-04-16: 1 mg via ORAL
  Filled 2015-04-15: qty 1

## 2015-04-15 MED ORDER — DIPHENHYDRAMINE HCL 50 MG/ML IJ SOLN
INTRAMUSCULAR | Status: AC
  Administered 2015-04-15: 50 mg
  Filled 2015-04-15: qty 1

## 2015-04-15 MED ORDER — DIVALPROEX SODIUM ER 500 MG PO TB24
500.0000 mg | ORAL_TABLET | Freq: Every day | ORAL | Status: DC
  Administered 2015-04-16: 500 mg via ORAL
  Filled 2015-04-15 (×2): qty 1

## 2015-04-15 MED ORDER — ZIPRASIDONE MESYLATE 20 MG IM SOLR
20.0000 mg | Freq: Once | INTRAMUSCULAR | Status: AC
  Administered 2015-04-15: 20 mg via INTRAMUSCULAR
  Filled 2015-04-15: qty 20

## 2015-04-15 NOTE — ED Notes (Signed)
Patient cooperatively sitting in bed at this time. Given 2 cups of ice water.

## 2015-04-15 NOTE — ED Notes (Signed)
Upon initial assessment, patient crying and yelling. Pt reports hearing voices and needing help. Pt agreed to IM geodon, ativan, and benadryl.

## 2015-04-15 NOTE — ED Notes (Signed)
Patient refused blood work, left room and walked around the hall. [employer] and security both present with patient in sight. Phlebotomy able to get blood from patient, but pt on phone yelling about being woken up for blood work.

## 2015-04-15 NOTE — BH Assessment (Signed)
[facility] Assessment Progress Note   Clinician talked to nurse Trina Ao regarding how alert and oriented patient was at this time.  She said that he was very sleepy because of being given geodon, ativan and benedryl.  He had also been administered his nighttime meds which included welbutrin.    Clinician also talked to Dr. Clydene Pugh who said he would withdraw the TTS request for the time being.  He will probably re-instate it around midnight before he leaves.

## 2015-04-15 NOTE — ED Notes (Signed)
Faxed IVC papers to magistrate 

## 2015-04-15 NOTE — ED Notes (Signed)
[employer] and security called when pt arrived.

## 2015-04-15 NOTE — BHH Counselor (Signed)
Received TTS Consult to assess pt.  Called nurse [facility] to setup TA and nurse advised that pt ha been given Geodon and has been extremely agitated so assessment is not possible at this time.  Acknowledged and requested that nurse ask EDP to cancel the TTS Consult until pt can be assessed.  Nurse voiced agreement.  Beryle Flock, MS, CRC, [facility] Triage Specialist [facility]
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
## 2015-04-15 NOTE — ED Notes (Signed)
Pt still in street clothes. Pt calmly talking with staff at this time. Pt ambulatory to bed, given meal.

## 2015-04-15 NOTE — ED Notes (Signed)
Pt asleep at this time

## 2015-04-15 NOTE — ED Notes (Signed)
Original IVC paperwork placed in red bin. Copy placed in medical records. Copy faxed to [facility].
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
## 2015-04-16 DIAGNOSIS — F203 Undifferentiated schizophrenia: Secondary | ICD-10-CM
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
## 2015-04-16 NOTE — ED Notes (Addendum)
Patient standing at door at this time will not go back in room at this time. Sitter at door with patient , Officer Rickest standing outside door also.

## 2015-04-16 NOTE — ED Notes (Signed)
TTS being completed, Plan of care is for pt. To be discharged to home.

## 2015-04-16 NOTE — Discharge Instructions (Signed)
Schizophrenia °Schizophrenia is a mental illness. It may cause disturbed or disorganized thinking, speech, or behavior. People with schizophrenia have problems functioning in one or more areas of life: work, school, home, or relationships. People with schizophrenia are at increased risk for suicide, certain chronic physical illnesses, and unhealthy behaviors, such as smoking and drug use. °People who have family members with schizophrenia are at higher risk of developing the illness. Schizophrenia affects men and women equally but usually appears at an earlier age (teenage or early adult years) in men.  °SYMPTOMS °The earliest symptoms are often subtle (prodrome) and may go unnoticed until the illness becomes more severe (first-break psychosis). Symptoms of schizophrenia may be continuous or may come and go in severity. Episodes often are triggered by major life events, such as family stress, college, military service, marriage, pregnancy or child birth, divorce, or loss of a loved one. People with schizophrenia may see, hear, or feel things that do not exist (hallucinations). They may have false beliefs in spite of obvious proof to the contrary (delusions). Sometimes speech is incoherent or behavior is odd or withdrawn.  °DIAGNOSIS °Schizophrenia is diagnosed through an assessment by your caregiver. Your caregiver will ask questions about your thoughts, behavior, mood, and ability to function in daily life. Your caregiver may ask questions about your medical history and use of alcohol or drugs, including prescription medication. Your caregiver may also order blood tests and imaging exams. Certain medical conditions and substances can cause symptoms that resemble schizophrenia. Your caregiver may refer you to a mental health specialist for evaluation. There are three major criterion for a diagnosis of schizophrenia: °· Two or more of the following five symptoms are present for a month or longer: °¨ Delusions. Often  the delusions are that you are being attacked, harassed, cheated, persecuted or conspired against (persecutory delusions). °¨ Hallucinations.   °¨ Disorganized speech that does not make sense to others. °¨ Grossly disorganized (confused or unfocused) behavior or extremely overactive or underactive motor activity (catatonia). °¨ Negative symptoms such as bland or blunted emotions (flat affect), loss of will power (avolition), and withdrawal from social contacts (social isolation). °· Level of functioning in one or more major areas of life (work, school, relationships, or self-care) is markedly below the level of functioning before the onset of illness.   °· There are continuous signs of illness (either mild symptoms or decreased level of functioning) for at least 6 months or longer. °TREATMENT  °Schizophrenia is a long-term illness. It is best controlled with continuous treatment rather than treatment only when symptoms occur. The following treatments are used to manage schizophrenia: °· Medication--Medication is the most effective and important form of treatment for schizophrenia. Antipsychotic medications are usually prescribed to help manage schizophrenia. Other types of medication may be added to relieve any symptoms that may occur despite the use of antipsychotic medications. °· Counseling or talk therapy--Individual, group, or family counseling may be helpful in providing education, support, and guidance. Many people with schizophrenia also benefit from social skills and job skills (vocational) training. °A combination of medication and counseling is best for managing the disorder over time. A procedure in which electricity is applied to the brain through the scalp (electroconvulsive therapy) may be used to treat catatonic schizophrenia or schizophrenia in people who cannot take or do not respond to medication and counseling. °  °This information is not intended to replace advice given to you by your health  care provider. Make sure you discuss any questions you have with   your health care provider. °  °Document Released: 02/04/2000 Document Revised: 02/27/2014 Document Reviewed: 05/01/2012 °Elsevier Interactive Patient Education ©2016 [employer] ° °

## 2015-04-16 NOTE — Progress Notes (Signed)
IVC Rescinded- Notice of Commitment Change signed by EDP and faxed to Magistrate  Noe Gens, LCSW [facility] ED/ 2 [facility] Clinical Social Worker [phone]

## 2015-04-16 NOTE — ED Provider Notes (Signed)
TTS evaluated the patient feel like he is safe to go home. Patient is now calm and cooperative. States that he will follow-up with [facility]. Denies suicidal or homicidal ideation. Discharge home.  Melene Plan, DO 04/16/15 1053

## 2015-04-16 NOTE — ED Notes (Signed)
[facility] called in regards to TTS assessment. Due to patient's refusal to wake up at this time to do the assessment, [facility] plans to do it after their shift change.

## 2015-04-16 NOTE — ED Notes (Signed)
Patient was given a snack and drink. 

## 2015-04-16 NOTE — [facility] Counselor (Signed)
Called nurse Nehemiah Settle working with pt to check on pt and readiness for assessment.  Nurse sts that staff attempted to awaken pt for blood to be draw about 1 hr ago with little success.  Nurse sts that pt is currently in a deep sleep and snoring. Advised no TTS Consult order is in currently but when pt is ready EDP can re-order if/when needed.  Beryle Flock, MS, CRC, [facility] Triage Specialist [facility]

## 2015-04-27 ENCOUNTER — Emergency Department (HOSPITAL_COMMUNITY)
Admission: EM | Admit: 2015-04-27 | Discharge: 2015-04-29 | Disposition: A | Discharge status: Home / Self Care | Payer: Medicaid Other | Attending: Emergency Medicine | Admitting: Emergency Medicine

## 2015-04-27 DIAGNOSIS — I10 Essential (primary) hypertension: Secondary | ICD-10-CM | POA: Diagnosis not present

## 2015-04-27 DIAGNOSIS — F209 Schizophrenia, unspecified: Secondary | ICD-10-CM | POA: Diagnosis not present

## 2015-04-27 DIAGNOSIS — F17200 Nicotine dependence, unspecified, uncomplicated: Secondary | ICD-10-CM | POA: Diagnosis not present

## 2015-04-27 DIAGNOSIS — Z046 Encounter for general psychiatric examination, requested by authority: Secondary | ICD-10-CM | POA: Diagnosis present

## 2015-04-27 DIAGNOSIS — F919 Conduct disorder, unspecified: Secondary | ICD-10-CM | POA: Diagnosis not present

## 2015-04-27 DIAGNOSIS — Z79899 Other long term (current) drug therapy: Secondary | ICD-10-CM | POA: Insufficient documentation

## 2015-04-27 DIAGNOSIS — J45909 Unspecified asthma, uncomplicated: Secondary | ICD-10-CM | POA: Diagnosis not present

## 2015-04-27 DIAGNOSIS — Z590 Homelessness: Secondary | ICD-10-CM | POA: Diagnosis not present

## 2015-04-27 DIAGNOSIS — F1210 Cannabis abuse, uncomplicated: Secondary | ICD-10-CM | POA: Diagnosis not present

## 2015-04-27 DIAGNOSIS — F250 Schizoaffective disorder, bipolar type: Secondary | ICD-10-CM | POA: Diagnosis not present

## 2015-04-27 LAB — CBC WITH DIFFERENTIAL/PLATELET
Basophils Absolute: 0.1 10*3/uL (ref 0.0–0.1)
Basophils Relative: 1 %
Eosinophils Absolute: 0.1 10*3/uL (ref 0.0–0.7)
Eosinophils Relative: 1 %
HCT: 53.3 % — ABNORMAL HIGH (ref 39.0–52.0)
Hemoglobin: 17.6 g/dL — ABNORMAL HIGH (ref 13.0–17.0)
Lymphocytes Relative: 19 %
Lymphs Abs: 1.6 10*3/uL (ref 0.7–4.0)
MCH: 31.1 pg (ref 26.0–34.0)
MCHC: 33 g/dL (ref 30.0–36.0)
MCV: 94.2 fL (ref 78.0–100.0)
Monocytes Absolute: 0.6 10*3/uL (ref 0.1–1.0)
Monocytes Relative: 7 %
Neutro Abs: 6 10*3/uL (ref 1.7–7.7)
Neutrophils Relative %: 72 %
Platelets: 198 10*3/uL (ref 150–400)
RBC: 5.66 MIL/uL (ref 4.22–5.81)
RDW: 12.2 % (ref 11.5–15.5)
WBC: 8.4 10*3/uL (ref 4.0–10.5)

## 2015-04-27 LAB — RAPID URINE DRUG SCREEN, HOSP PERFORMED
Amphetamines: NOT DETECTED
Barbiturates: NOT DETECTED
Benzodiazepines: NOT DETECTED
Cocaine: NOT DETECTED
Opiates: NOT DETECTED
Tetrahydrocannabinol: POSITIVE — AB

## 2015-04-27 LAB — COMPREHENSIVE METABOLIC PANEL
ALT: 19 U/L (ref 17–63)
AST: 22 U/L (ref 15–41)
Albumin: 4.7 g/dL (ref 3.5–5.0)
Alkaline Phosphatase: 87 U/L (ref 38–126)
Anion gap: 12 (ref 5–15)
BUN: 14 mg/dL (ref 6–20)
CO2: 25 mmol/L (ref 22–32)
Calcium: 10.4 mg/dL — ABNORMAL HIGH (ref 8.9–10.3)
Chloride: 107 mmol/L (ref 101–111)
Creatinine, Ser: 0.96 mg/dL (ref 0.61–1.24)
GFR calc Af Amer: >60 mL/min (ref >60)
GFR calc non Af Amer: >60 mL/min (ref >60)
Glucose, Bld: 105 mg/dL — ABNORMAL HIGH (ref 65–99)
Potassium: 4 mmol/L (ref 3.5–5.1)
Sodium: 144 mmol/L (ref 135–145)
Total Bilirubin: 0.8 mg/dL (ref 0.3–1.2)
Total Protein: 8.1 g/dL (ref 6.5–8.1)

## 2015-04-27 LAB — ETHANOL: Alcohol, Ethyl (B): <5 mg/dL (ref <5)

## 2015-04-27 LAB — ACETAMINOPHEN LEVEL: Acetaminophen (Tylenol), Serum: <10 ug/mL — ABNORMAL LOW (ref 10–30)

## 2015-04-27 LAB — SALICYLATE LEVEL: Salicylate Lvl: <4 mg/dL (ref 2.8–30.0)

## 2015-04-27 MED ORDER — ZIPRASIDONE MESYLATE 20 MG IM SOLR
20.0000 mg | Freq: Four times a day (QID) | INTRAMUSCULAR | Status: DC | PRN
  Filled 2015-04-27: qty 20

## 2015-04-27 MED ORDER — LORAZEPAM 1 MG PO TABS: 2.0000 mg | ORAL_TABLET | Freq: Once | ORAL | Status: DC

## 2015-04-27 MED ORDER — LORAZEPAM 2 MG/ML IJ SOLN
2.0000 mg | Freq: Once | INTRAMUSCULAR | Status: DC
  Administered 2015-04-27: 2 mg via INTRAMUSCULAR
  Filled 2015-04-27: qty 1

## 2015-04-27 MED ORDER — LORAZEPAM 2 MG/ML IJ SOLN
2.0000 mg | Freq: Once | INTRAMUSCULAR | Status: AC
  Administered 2015-04-27: 2 mg via INTRAMUSCULAR

## 2015-04-27 MED ORDER — ZIPRASIDONE MESYLATE 20 MG IM SOLR
20.0000 mg | Freq: Once | INTRAMUSCULAR | Status: AC
  Administered 2015-04-27: 20 mg via INTRAMUSCULAR

## 2015-04-27 MED ORDER — STERILE WATER FOR INJECTION IJ SOLN
INTRAMUSCULAR | Status: AC
  Filled 2015-04-27: qty 10
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
## 2015-04-27 NOTE — BHH Counselor (Signed)
Per [name] CottonJosephine, NP [facility] need to [facility] prior to discharge. Psychiatry has also requested patient's ACTT provider to come and see patient face to face . Writer made a attempt to contact the ACTT provider crises line [phone]. Spoke to staff and they are agreeable to come and seek patient today. ETA of ACTT provider is 1730.
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
## 2015-04-27 NOTE — ED Notes (Signed)
Patient has four bags of belongings in locker 26.

## 2015-04-27 NOTE — Progress Notes (Signed)
Entered in d/c instructions  [facility], [facility] This is your assigned Medicaid [place] access doctor If you prefer another contact DSS [phone] DSS assigned your doctor *You may receive a bill if you go to any family Dr not assigned to you [address] [place] [id number] [phone] Medicaid [place] Access Covered Patient [place]: [phone] [place], [place] [address] [email or web address].ncdhhs.gov/ Use this website to assist with understanding your coverage & to renew application As a Medicaid client you MUST contact DSS/SSI each time you change address, move to another [place] or another state to keep your address updated  [place] Transportation to Dr appts if you are have full Medicaid: [phone], [phone]

## 2015-04-27 NOTE — BH Assessment (Signed)
[facility] with [facility] Lead [facility] who states that she received a call to contact this Clinical research associatewriter.  This Clinical research associatewriter informed her that the patients nurse came in and stated that the patients ACTT requested to speak with someone and was told to call back after report.  Patients ACTT Lead asked if the patient had been seen and this writer informed the ACTT Lead that the patient was seen earlier today by a Nurse Practitioner who originally agreed to discharge the patient and after speaking with the ACTT Lead decided to observe the aptient overnight with the ACT Team agreeing to see the patient in the morning.  ACT Team Lead states that a staff member will be here to see the patient around 9:30AM to do an assessment and communicate collateral information and speak with psychiatric team.   Spoke with patient who states that he spoke to someone on the ACT Team who states that he can go home. Informed patient that he is under IVC and is not able to go home at this time.  Patient states that he can go home and was adamant that he spoke with someone that said he could leave. He states that he "just came to get some medicine and I can leave." Patient was informed of the IVC and that he is una le to leave until further notice.  Patient asked the patients nurse to use the phone to speak with his ACTT.   Davina PokeJoVea Collyn Ribas, LCSW Therapeutic Triage Specialist [facility] 04/27/2015 7:34 PM
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
## 2015-04-28 ENCOUNTER — Encounter (HOSPITAL_COMMUNITY): Payer: Self-pay | Admitting: *Deleted

## 2015-04-28 DIAGNOSIS — F250 Schizoaffective disorder, bipolar type: Secondary | ICD-10-CM | POA: Diagnosis present

## 2015-04-28 MED ORDER — LORAZEPAM 2 MG/ML IJ SOLN
1.0000 mg | Freq: Once | INTRAMUSCULAR | Status: AC
Start: 2015-04-28 — End: 2015-04-28
  Administered 2015-04-28: 1 mg via INTRAMUSCULAR
  Filled 2015-04-28: qty 1

## 2015-04-28 MED ORDER — ACETAMINOPHEN 325 MG PO TABS: 650.0000 mg | ORAL_TABLET | ORAL | Status: DC | PRN

## 2015-04-28 MED ORDER — LITHIUM CARBONATE 300 MG PO CAPS
1200.0000 mg | ORAL_CAPSULE | Freq: Every day | ORAL | Status: DC
  Administered 2015-04-28: 1200 mg via ORAL
  Filled 2015-04-28: qty 4

## 2015-04-28 MED ORDER — BUSPIRONE HCL 10 MG PO TABS
15.0000 mg | ORAL_TABLET | Freq: Three times a day (TID) | ORAL | Status: DC
  Administered 2015-04-28 (×2): 15 mg via ORAL
  Filled 2015-04-28 (×2): qty 2

## 2015-04-28 MED ORDER — TRAZODONE HCL 100 MG PO TABS
100.0000 mg | ORAL_TABLET | Freq: Every day | ORAL | Status: DC
  Administered 2015-04-28: 100 mg via ORAL
  Filled 2015-04-28: qty 1

## 2015-04-28 MED ORDER — AMLODIPINE BESYLATE 5 MG PO TABS
5.0000 mg | ORAL_TABLET | Freq: Every day | ORAL | Status: DC
  Administered 2015-04-28 – 2015-04-29 (×2): 5 mg via ORAL
  Filled 2015-04-28 (×2): qty 1

## 2015-04-28 MED ORDER — DIVALPROEX SODIUM ER 500 MG PO TB24
500.0000 mg | ORAL_TABLET | Freq: Every day | ORAL | Status: DC
  Filled 2015-04-28 (×2): qty 1

## 2015-04-28 MED ORDER — ZOLPIDEM TARTRATE 5 MG PO TABS: 5.0000 mg | ORAL_TABLET | Freq: Every evening | ORAL | Status: DC | PRN

## 2015-04-28 MED ORDER — CHLORPROMAZINE HCL 100 MG PO TABS
300.0000 mg | ORAL_TABLET | Freq: Every day | ORAL | Status: DC
  Administered 2015-04-28: 300 mg via ORAL
  Filled 2015-04-28 (×2): qty 3

## 2015-04-28 MED ORDER — HYDROCHLOROTHIAZIDE 12.5 MG PO CAPS
12.5000 mg | ORAL_CAPSULE | Freq: Every day | ORAL | Status: DC
  Administered 2015-04-29: 12.5 mg via ORAL
  Filled 2015-04-28: qty 1

## 2015-04-28 MED ORDER — DIPHENHYDRAMINE HCL 50 MG/ML IJ SOLN
25.0000 mg | Freq: Once | INTRAMUSCULAR | Status: AC
Start: 2015-04-28 — End: 2015-04-28
  Administered 2015-04-28: 25 mg via INTRAMUSCULAR
  Filled 2015-04-28: qty 1

## 2015-04-28 MED ORDER — ZIPRASIDONE MESYLATE 20 MG IM SOLR
20.0000 mg | Freq: Once | INTRAMUSCULAR | Status: DC | PRN
  Filled 2015-04-28: qty 20

## 2015-04-28 MED ORDER — HYDROCHLOROTHIAZIDE 25 MG PO TABS
12.5000 mg | ORAL_TABLET | Freq: Every day | ORAL | Status: DC
  Filled 2015-04-28: qty 0.5

## 2015-04-28 MED ORDER — ARIPIPRAZOLE 10 MG PO TABS
10.0000 mg | ORAL_TABLET | Freq: Every day | ORAL | Status: DC
Start: 2015-04-28 — End: 2015-04-28

## 2015-04-28 MED ORDER — BENZTROPINE MESYLATE 1 MG PO TABS
1.0000 mg | ORAL_TABLET | Freq: Every day | ORAL | Status: DC
  Administered 2015-04-28: 1 mg via ORAL
  Filled 2015-04-28: qty 1

## 2015-04-28 MED ORDER — ONDANSETRON HCL 4 MG PO TABS: 4.0000 mg | ORAL_TABLET | Freq: Three times a day (TID) | ORAL | Status: DC | PRN

## 2015-04-28 MED ORDER — LORAZEPAM 1 MG PO TABS
1.0000 mg | ORAL_TABLET | Freq: Three times a day (TID) | ORAL | Status: DC | PRN
  Administered 2015-04-28 (×2): 1 mg via ORAL
  Filled 2015-04-28 (×2): qty 1

## 2015-04-28 MED ORDER — STERILE WATER FOR INJECTION IJ SOLN
INTRAMUSCULAR | Status: AC
  Administered 2015-04-28: 14:00:00
  Filled 2015-04-28: qty 10

## 2015-04-28 MED ORDER — ZIPRASIDONE MESYLATE 20 MG IM SOLR
20.0000 mg | Freq: Once | INTRAMUSCULAR | Status: AC
  Administered 2015-04-28: 20 mg via INTRAMUSCULAR

## 2015-04-28 NOTE — ED Notes (Signed)
Pt stated "It's a good thing I like white people."  Pt outside room stating "they told me I could go home."  Requested pt go back to room; pt was cooperative and complied.
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
## 2015-04-28 NOTE — ED Notes (Signed)
Pt given peanut butter, crackers and soda. He is calm and cooperative at this time.

## 2015-04-28 NOTE — ED Notes (Signed)
Patient refused food tray.  Placed call for patient to have hamburger and fries.  Food service stated they would bring his food up.

## 2015-04-28 NOTE — ED Notes (Signed)
Pt ambulated to bathroom, [employer] officer released handcuffs from right arm. Pt remains handcuffed to bed rail (left arm)
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
## 2015-04-28 NOTE — ED Notes (Signed)
Patient given tray but he was asleep and would not wake up to eat.
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
## 2015-04-28 NOTE — ED Notes (Signed)
Care assumed at 1600; pt observed lying in the bed, resting with eyes closed, respirations even, unlabored. Pt refused his medications stating:"Two can play this game, if you won't let me go home I won't take my pills". Pt is handcuffed to bed rail, [employer] officers at bedside

## 2015-04-29 DIAGNOSIS — F250 Schizoaffective disorder, bipolar type: Secondary | ICD-10-CM

## 2015-04-29 DIAGNOSIS — F919 Conduct disorder, unspecified: Secondary | ICD-10-CM

## 2015-04-29 MED ORDER — BUSPIRONE HCL 10 MG PO TABS: 10.0000 mg | ORAL_TABLET | Freq: Three times a day (TID) | ORAL | Status: DC

## 2015-04-29 MED ORDER — BUSPIRONE HCL 10 MG PO TABS
10.0000 mg | ORAL_TABLET | Freq: Three times a day (TID) | ORAL | Status: DC
  Administered 2015-04-29: 10 mg via ORAL
  Filled 2015-04-29: qty 1

## 2015-04-29 MED ORDER — TRAZODONE HCL 100 MG PO TABS: 100.0000 mg | ORAL_TABLET | Freq: Every day | ORAL | Status: DC

## 2015-04-29 MED ORDER — BENZTROPINE MESYLATE 1 MG PO TABS
1.0000 mg | ORAL_TABLET | Freq: Two times a day (BID) | ORAL | Status: DC
  Administered 2015-04-29: 1 mg via ORAL
  Filled 2015-04-29: qty 1

## 2015-04-29 MED ORDER — ZIPRASIDONE HCL 20 MG PO CAPS
60.0000 mg | ORAL_CAPSULE | Freq: Two times a day (BID) | ORAL | Status: DC
  Administered 2015-04-29: 60 mg via ORAL
  Filled 2015-04-29: qty 3

## 2015-04-29 MED ORDER — BENZTROPINE MESYLATE 1 MG PO TABS: 1.0000 mg | ORAL_TABLET | Freq: Two times a day (BID) | ORAL | Status: DC

## 2015-04-29 MED ORDER — ZIPRASIDONE HCL 60 MG PO CAPS: 60.0000 mg | ORAL_CAPSULE | Freq: Two times a day (BID) | ORAL | Status: DC

## 2015-04-29 MED ORDER — LITHIUM CARBONATE 600 MG PO CAPS: 1200.0000 mg | ORAL_CAPSULE | Freq: Every day | ORAL | Status: DC

## 2015-04-29 NOTE — [facility] Suicide Risk Assessment (Cosign Needed)
Suicide Risk Assessment  Discharge Assessment   [facility]   Principal Problem: Schizoaffective disorder, bipolar type [facility](HCC) Discharge Diagnoses:  Patient Active Problem List   Diagnosis Date Noted  . Schizophrenia (HCC) [F20.9] 10/10/2014    Priority: High  . Schizoaffective disorder, bipolar type (HCC) [F25.0] 04/28/2015    Priority: Medium  . Behavior disturbance [F91.9]     Total Time spent with patient: 20 minutes  Musculoskeletal: Strength & Muscle Tone: within normal limits Gait & Station: normal Patient leans: N/A  Psychiatric Specialty Exam:   Blood pressure 131/71, pulse 107, temperature 98.2 F (36.8 C), temperature source Oral, resp. rate 19, SpO2 98 %.There is no weight on file to calculate BMI.  General Appearance: Casual  Eye Contact:: Good  Speech: Clear and Coherent and Normal Rate  Volume: Normal  Mood: Anxious  Affect: Congruent  Thought Process: Coherent, Goal Directed and Intact  Orientation: Full (Time, Place, and Person)  Thought Content: WDL  Suicidal Thoughts: No  Homicidal Thoughts: No  Memory: Immediate; Good Recent; Good Remote; Good  Judgement: Intact  Insight: Good  Psychomotor Activity: Normal  Concentration: Good  Recall: Good  Fund of Knowledge: Good  Language: Good  Akathisia: NA  Handed: Right  AIMS (if indicated):    Assets: Desire for Improvement  ADL's: Intact  Cognition: WNL         Mental Status Per Nursing Assessment::   On Admission:     Demographic Factors:  Male, Adolescent or young adult, Low socioeconomic status and Unemployed  Loss Factors: NA  Historical Factors: Impulsivity  Risk Reduction Factors:   Living with another person, especially a relative  Continued Clinical Symptoms:  Schizophrenia:   Paranoid or undifferentiated type Previous Psychiatric Diagnoses and Treatments  Cognitive Features That Contribute  To Risk:  Polarized thinking    Suicide Risk:  Minimal: No identifiable suicidal ideation.  Patients presenting with no risk factors but with morbid ruminations; may be classified as minimal risk based on the severity of the depressive symptoms  Follow-up Information    Follow up with [facility]   Specialty:  Internal Medicine   Why:  This is your assigned Medicaid [place] access doctor If you prefer another contact DSS [phone] DSS assigned your doctor *You may receive a bill if you go to any family Dr not assigned to you   Contact information:   [address] [place] [id number] [phone]       Follow up with Medicaid [place] Access Covered Patient .   WhyHaynes Bast:  [phone]: [phone] [address] [place], [place] [address] [email or web address].ncdhhs.gov/ Use this website to assist with understanding your coverage & to renew application   Contact information:   As a Medicaid client you MUST contact DSS/SSI each time you change address, move to another [place] or another state to keep your address updated  [place] Medicaid Transportation to Dr appts if you are have full Medicaid: [phone], [phone]      Plan Of Care/Follow-up recommendations:  Activity:  As tolerated Diet:  regular  Earney NavyJosephine C Tramane Gorum, NP   PMHNP-BC 04/29/2015, 3:15 PM

## 2015-04-29 NOTE — Discharge Instructions (Signed)
For your ongoing behavioral health needs, you are being recommended for ACT Team services through [facility].  They will be contacting you within the next few days at your mother's home for an intake interview:       [facility]      [address]      [place], [place] [address]      [phone]

## 2015-04-29 NOTE — Consult Note (Signed)
Psychiatric Specialty Exam: Physical Exam  ROS  Blood pressure 131/71, pulse 107, temperature 98.2 F (36.8 C), temperature source Oral, resp. rate 19, SpO2 98 %.There is no weight on file to calculate BMI.  General Appearance: Casual  Eye Contact::  Good  Speech:  Clear and Coherent and Normal Rate  Volume:  Normal  Mood:  Anxious  Affect:  Congruent  Thought Process:  Coherent, Goal Directed and Intact  Orientation:  Full (Time, Place, and Person)  Thought Content:  WDL  Suicidal Thoughts:  No  Homicidal Thoughts:  No  Memory:  Immediate;   Good Recent;   Good Remote;   Good  Judgement:  Intact  Insight:  Good  Psychomotor Activity:  Normal  Concentration:  Good  Recall:  Good  Fund of Knowledge:  Good  Language:  Good  Akathisia:  NA  Handed:  Right  AIMS (if indicated):     Assets:  Desire for Improvement  ADL's:  Intact  Cognition:  WNL  Sleep:      Patient has been cooperative and calm today.  He was started on Geodon 60 mg po twice a day and he promises to be compliant with this.  His Depakote is discontinued because he states he was not going to take it.  Patient will continue taking his Haldol decanoate when due.  Patient reports good night sleep last night.  Mother is willing to keep patient and help him with his medications.  Patient does not have an ACT team at this time but staff from [facility] ACT team will be going to interview him at his mother's home.  Dr Lucianne MussKumar is aware that patient does not have an ACT team and has instructed that patient to be discharged to [facility] pending evaluation by [facility] ACT team.  Patient is discharged home. Schizoaffective disorder, bipolar type [facility](HCC)   Plan: Discharge home  [name] ByesJosephine [name]   PMHNP-BC Patient seen face-to-face for psychiatric evaluation, chart reviewed and case discussed with the physician extender and developed treatment plan. Reviewed the  information documented and agree with the treatment plan. [name] MinsMojeed Angalina Ante, MD

## 2015-04-29 NOTE — BH Assessment (Signed)
BHH Assessment Progress Note  Per Thedore MinsMojeed Akintayo, MD, this pt will be safe for release from IVC and discharge into the community, provided that he has a place to stay, as well as a new ACT Team provider.  The following calls have been placed to facilitate these accommodations:  11:32 - This Clinical research associatewriter called Porfirio Mylararmen with the [facility].  She confirms that pt is no longer eligible for services through them. 11:55 - I spoke to pt's mother, [name] [name] [phone]).  She is agreeable to having pt go to her home, but wants him to have an outpatient provider in place, as well as medications 11:58 - I left a message for pt's girlfriend, [name] [name] [phone]).  At 13:05 she calls back.  She feels safe with pt returning to the home that she and the pt share, but also feels that pt needs to have an outpatient provider in place 12:06 - I spoke to [facility] with [facility].  Pt has been in treatment with them and is no longer eligible for their services. 12:12 - I left a message for Judeth CornfieldStephanie with [facility] ACT Team.  At 13:00 she calls back.  She is uncertain whether someone can come to [facility] today for an intake interview, but could arrange for this in the community in the next day or two. 13:55 - I spoke to [place] with [facility] ACT Team.  They are familiar with the pt and he is not eligible for services through them.  It was then decided that pt is to follow up with the [facility] ACT [facility].  It is agreed that they will arrange for an intake interview with him at his mother's home within the next day or two.  [name] ByesJosephine Onuoha, NP concurs with this decision.  Discharge instructions include contact information for [facility].  Pt has been released from IVC.  Pt's nurse has been notified.  [name] Canninghomas Leeland Lovelady, MA Triage Specialist [phone]

## 2015-06-17 ENCOUNTER — Encounter (HOSPITAL_COMMUNITY): Payer: Self-pay | Admitting: Emergency Medicine

## 2015-06-17 ENCOUNTER — Emergency Department (HOSPITAL_COMMUNITY)
Admission: EM | Admit: 2015-06-17 | Discharge: 2015-06-18 | Disposition: A | Discharge status: Home / Self Care | Payer: Medicaid Other | Attending: Emergency Medicine | Admitting: Emergency Medicine

## 2015-06-17 DIAGNOSIS — F209 Schizophrenia, unspecified: Secondary | ICD-10-CM | POA: Insufficient documentation

## 2015-06-17 DIAGNOSIS — Z7951 Long term (current) use of inhaled steroids: Secondary | ICD-10-CM | POA: Insufficient documentation

## 2015-06-17 DIAGNOSIS — J45909 Unspecified asthma, uncomplicated: Secondary | ICD-10-CM | POA: Diagnosis not present

## 2015-06-17 DIAGNOSIS — Z79899 Other long term (current) drug therapy: Secondary | ICD-10-CM | POA: Diagnosis not present

## 2015-06-17 DIAGNOSIS — F17200 Nicotine dependence, unspecified, uncomplicated: Secondary | ICD-10-CM | POA: Insufficient documentation

## 2015-06-17 DIAGNOSIS — I10 Essential (primary) hypertension: Secondary | ICD-10-CM | POA: Insufficient documentation

## 2015-06-17 DIAGNOSIS — F250 Schizoaffective disorder, bipolar type: Secondary | ICD-10-CM | POA: Diagnosis present

## 2015-06-17 DIAGNOSIS — F1220 Cannabis dependence, uncomplicated: Secondary | ICD-10-CM | POA: Diagnosis present

## 2015-06-17 DIAGNOSIS — R45851 Suicidal ideations: Secondary | ICD-10-CM | POA: Insufficient documentation

## 2015-06-17 LAB — COMPREHENSIVE METABOLIC PANEL
ALT: 20 U/L (ref 17–63)
AST: 25 U/L (ref 15–41)
Albumin: 4.7 g/dL (ref 3.5–5.0)
Alkaline Phosphatase: 80 U/L (ref 38–126)
Anion gap: 10 (ref 5–15)
BUN: 9 mg/dL (ref 6–20)
CO2: 27 mmol/L (ref 22–32)
Calcium: 9.8 mg/dL (ref 8.9–10.3)
Chloride: 106 mmol/L (ref 101–111)
Creatinine, Ser: 1.13 mg/dL (ref 0.61–1.24)
GFR calc Af Amer: >60 mL/min (ref >60)
GFR calc non Af Amer: >60 mL/min (ref >60)
Glucose, Bld: 121 mg/dL — ABNORMAL HIGH (ref 65–99)
Potassium: 3.5 mmol/L (ref 3.5–5.1)
Sodium: 143 mmol/L (ref 135–145)
Total Bilirubin: 1.2 mg/dL (ref 0.3–1.2)
Total Protein: 7.8 g/dL (ref 6.5–8.1)

## 2015-06-17 LAB — CBC
HCT: 49.5 % (ref 39.0–52.0)
Hemoglobin: 16.7 g/dL (ref 13.0–17.0)
MCH: 31.3 pg (ref 26.0–34.0)
MCHC: 33.7 g/dL (ref 30.0–36.0)
MCV: 92.7 fL (ref 78.0–100.0)
Platelets: 201 10*3/uL (ref 150–400)
RBC: 5.34 MIL/uL (ref 4.22–5.81)
RDW: 12.8 % (ref 11.5–15.5)
WBC: 9.8 10*3/uL (ref 4.0–10.5)

## 2015-06-17 LAB — ACETAMINOPHEN LEVEL: Acetaminophen (Tylenol), Serum: <10 ug/mL — ABNORMAL LOW (ref 10–30)

## 2015-06-17 LAB — RAPID URINE DRUG SCREEN, HOSP PERFORMED
Amphetamines: NOT DETECTED
Barbiturates: NOT DETECTED
Benzodiazepines: NOT DETECTED
Cocaine: NOT DETECTED
Opiates: NOT DETECTED
Tetrahydrocannabinol: POSITIVE — AB

## 2015-06-17 LAB — ETHANOL: Alcohol, Ethyl (B): <5 mg/dL (ref <5)

## 2015-06-17 LAB — SALICYLATE LEVEL: Salicylate Lvl: <4 mg/dL (ref 2.8–30.0)

## 2015-06-17 MED ORDER — BENZTROPINE MESYLATE 1 MG PO TABS
1.0000 mg | ORAL_TABLET | Freq: Two times a day (BID) | ORAL | Status: DC
  Filled 2015-06-17: qty 1

## 2015-06-17 MED ORDER — BENZTROPINE MESYLATE 1 MG PO TABS
1.0000 mg | ORAL_TABLET | Freq: Every day | ORAL | Status: DC
  Administered 2015-06-18: 1 mg via ORAL

## 2015-06-17 MED ORDER — BUSPIRONE HCL 10 MG PO TABS
15.0000 mg | ORAL_TABLET | Freq: Three times a day (TID) | ORAL | Status: DC
  Administered 2015-06-18 (×2): 15 mg via ORAL
  Filled 2015-06-17 (×2): qty 2

## 2015-06-17 MED ORDER — HYDROCHLOROTHIAZIDE 12.5 MG PO CAPS: 12.5000 mg | ORAL_CAPSULE | Freq: Every day | ORAL | Status: DC

## 2015-06-17 MED ORDER — LITHIUM CARBONATE 300 MG PO TABS: 1200.0000 mg | ORAL_TABLET | Freq: Every day | ORAL | Status: DC

## 2015-06-17 MED ORDER — LITHIUM CARBONATE 300 MG PO CAPS
1200.0000 mg | ORAL_CAPSULE | Freq: Every day | ORAL | Status: DC
  Administered 2015-06-18: 1200 mg via ORAL
  Filled 2015-06-17 (×2): qty 4

## 2015-06-17 MED ORDER — ZIPRASIDONE HCL 20 MG PO CAPS
60.0000 mg | ORAL_CAPSULE | Freq: Two times a day (BID) | ORAL | Status: DC
  Administered 2015-06-18: 60 mg via ORAL
  Filled 2015-06-17: qty 3

## 2015-06-17 MED ORDER — TRAZODONE HCL 100 MG PO TABS
100.0000 mg | ORAL_TABLET | Freq: Every day | ORAL | Status: DC
  Administered 2015-06-18: 100 mg via ORAL
  Filled 2015-06-17: qty 1

## 2015-06-17 MED ORDER — HYDROCHLOROTHIAZIDE 25 MG PO TABS: 12.5000 mg | ORAL_TABLET | Freq: Every day | ORAL | Status: DC
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
## 2015-06-17 NOTE — ED Notes (Addendum)
Pt brought in by [employer] IVC'd. Per IVC papers pt told respondent he had no reason to live, wanted to kill himself, and had been using drugs. Pt denies SI/HI.

## 2015-06-17 NOTE — ED Provider Notes (Signed)
CSN: [id number]     Arrival date & time 06/17/15  1841 History   First MD Initiated Contact with Patient 06/17/15 2036     Chief Complaint  Patient presents with  . IVC suicidal      (Consider location/radiation/quality/duration/timing/severity/associated sxs/prior Treatment) HPI Comments: Patient presents to the emergency department under IVC for suicidal ideation. Reportedly, he told someone that he had no reason to live and that he wanted to kill himself. He also endorsed using drugs. Patient states now that he is not suicidal. He states that he did get into an argument with his wife, and felt depressed, but states that he never felt suicidal. He denies any homicidal ideation. He does admit to using drugs, but states that he only used marijuana today. He denies any other illicit drug uses. Denies any alcohol use. Denies any recent illnesses. He denies any pain. There are no modifying factors.  The history is provided by the patient. No language interpreter was used.    Past Medical History  Diagnosis Date  . Asthma   . Homeless   . Schizophrenia (HCC)   . Alcohol abuse   . Aggression   . Hypertension    Past Surgical History  Procedure Laterality Date  . Knee surgery    . Mouth surgery     History reviewed. No pertinent family history. Social History  Substance Use Topics  . Smoking status: Current Every Day Smoker  . Smokeless tobacco: None  . Alcohol Use: Yes    Review of Systems  Constitutional: Negative for fever and chills.  Respiratory: Negative for shortness of breath.   Cardiovascular: Negative for chest pain.  Gastrointestinal: Negative for nausea, vomiting, diarrhea and constipation.  Genitourinary: Negative for dysuria.  All other systems reviewed and are negative.     Allergies  Peach seed and Sudafed  Home Medications   Prior to Admission medications   Medication Sig Start Date End Date Taking? Authorizing Provider  busPIRone (BUSPAR) 10 MG  tablet Take 1 tablet (10 mg total) by mouth 3 (three) times daily. 04/29/15  Yes Earney Navy, NP  haloperidol decanoate (HALDOL DECANOATE) 100 MG/ML injection Inject 150 mg into the muscle every 28 (twenty-eight) days.    Yes Historical Provider, MD  hydrochlorothiazide (HYDRODIURIL) 12.5 MG tablet Take 12.5 mg by mouth daily.   Yes Historical Provider, MD  lithium carbonate 600 MG capsule Take 2 capsules (1,200 mg total) by mouth at bedtime. 04/29/15  Yes Earney Navy, NP  traZODone (DESYREL) 100 MG tablet Take 1 tablet (100 mg total) by mouth at bedtime. 04/29/15  Yes Earney Navy, NP  ziprasidone (GEODON) 60 MG capsule Take 1 capsule (60 mg total) by mouth 2 (two) times daily with a meal. 04/29/15  Yes Earney Navy, NP  benztropine (COGENTIN) 1 MG tablet Take 1 tablet (1 mg total) by mouth daily. Patient not taking: Reported on 06/17/2015 04/07/15   Gwyneth Sprout, MD  benztropine (COGENTIN) 1 MG tablet Take 1 tablet (1 mg total) by mouth 2 (two) times daily. Patient not taking: Reported on 06/17/2015 04/29/15   Earney Navy, NP  busPIRone (BUSPAR) 15 MG tablet Take 1 tablet (15 mg total) by mouth 3 (three) times daily. Patient not taking: Reported on 06/17/2015 04/07/15   Gwyneth Sprout, MD  lithium 300 MG tablet Take 4 tablets (1,200 mg total) by mouth at bedtime. Patient not taking: Reported on 06/17/2015 04/07/15   Gwyneth Sprout, MD  PROAIR HFA 108 [phone])  MCG/ACT inhaler INHALE 2 PUFFS BY MOUTH EVERY 6 HOURS PRN FOR WHEEZING AND SOB 05/21/15   Historical Provider, MD   BP 108/57 mmHg  Pulse 73  Temp(Src) 97.9 F (36.6 C) (Oral)  Resp 16  SpO2 98% Physical Exam  Constitutional: He is oriented to person, place, and time. He appears well-developed and well-nourished.  HENT:  Head: Normocephalic and atraumatic.  Eyes: Conjunctivae and EOM are normal. Pupils are equal, round, and reactive to light. Right eye exhibits no discharge. Left eye exhibits no discharge. No  scleral icterus.  Neck: Normal range of motion. Neck supple. No JVD present.  Cardiovascular: Normal rate, regular rhythm and normal heart sounds.  Exam reveals no gallop and no friction rub.   No murmur heard. Pulmonary/Chest: Effort normal and breath sounds normal. No respiratory distress. He has no wheezes. He has no rales. He exhibits no tenderness.  Abdominal: Soft. He exhibits no distension and no mass. There is no tenderness. There is no rebound and no guarding.  Musculoskeletal: Normal range of motion. He exhibits no edema or tenderness.  Neurological: He is alert and oriented to person, place, and time.  Skin: Skin is warm and dry.  Psychiatric: He has a normal mood and affect. His behavior is normal. Judgment and thought content normal.  Nursing note and vitals reviewed.   ED Course  Procedures (including critical care time) Results for orders placed or performed during the hospital encounter of 06/17/15  Comprehensive metabolic panel  Result Value Ref Range   Sodium 143 135 - 145 mmol/L   Potassium 3.5 3.5 - 5.1 mmol/L   Chloride 106 101 - 111 mmol/L   CO2 27 22 - 32 mmol/L   Glucose, Bld 121 (H) 65 - 99 mg/dL   BUN 9 6 - 20 mg/dL   Creatinine, Ser 1.611.13 0.61 - 1.24 mg/dL   Calcium 9.8 8.9 - 09.610.3 mg/dL   Total Protein 7.8 6.5 - 8.1 g/dL   Albumin 4.7 3.5 - 5.0 g/dL   AST 25 15 - 41 U/L   ALT 20 17 - 63 U/L   Alkaline Phosphatase 80 38 - 126 U/L   Total Bilirubin 1.2 0.3 - 1.2 mg/dL   GFR calc non Af Amer >60 >60 mL/min   GFR calc Af Amer >60 >60 mL/min   Anion gap 10 5 - 15  Ethanol (ETOH)  Result Value Ref Range   Alcohol, Ethyl (B) <5 <5 mg/dL  Salicylate level  Result Value Ref Range   Salicylate Lvl <4.0 2.8 - 30.0 mg/dL  Acetaminophen level  Result Value Ref Range   Acetaminophen (Tylenol), Serum <10 (L) 10 - 30 ug/mL  CBC  Result Value Ref Range   WBC 9.8 4.0 - 10.5 K/uL   RBC 5.34 4.22 - 5.81 MIL/uL   Hemoglobin 16.7 13.0 - 17.0 g/dL   HCT 04.549.5 40.939.0 -  81.152.0 %   MCV 92.7 78.0 - 100.0 fL   MCH 31.3 26.0 - 34.0 pg   MCHC 33.7 30.0 - 36.0 g/dL   RDW 91.412.8 78.211.5 - 95.615.5 %   Platelets 201 150 - 400 K/uL  Urine rapid drug screen (hosp performed) (Not at [facility])  Result Value Ref Range   Opiates NONE DETECTED NONE DETECTED   Cocaine NONE DETECTED NONE DETECTED   Benzodiazepines NONE DETECTED NONE DETECTED   Amphetamines NONE DETECTED NONE DETECTED   Tetrahydrocannabinol POSITIVE (A) NONE DETECTED   Barbiturates NONE DETECTED NONE DETECTED   No results found.  I  have personally reviewed and evaluated these images and lab results as part of my medical decision-making.    MDM   Final diagnoses:  Suicidal ideation    Patient here under IVC for suicidal ideation. He denies this now. TTS consult pending. Patient is medically clear.  Plan for psychiatry to see patient in AM.     Roxy Horseman, PA-C 06/17/15 2308  Jacalyn Lefevre, MD 06/21/15 [phone]

## 2015-06-17 NOTE — ED Notes (Signed)
Bed: WA12 Expected date:  Expected time:  Means of arrival:  Comments: 

## 2015-06-18 DIAGNOSIS — F250 Schizoaffective disorder, bipolar type: Secondary | ICD-10-CM

## 2015-06-18 DIAGNOSIS — F1220 Cannabis dependence, uncomplicated: Secondary | ICD-10-CM

## 2015-06-18 NOTE — BHH Counselor (Signed)
Psych disposition: Pt will need to be re-assessed in the morning by psychiatry to uphold or rescind IVC paperwork. EDP not comfortable rescinding IVC tonight.  Roxy Horsemanobert Browning, PA-C made aware of disposition and is in agreement.   - Cyndie MullAnna Jilberto Vanderwall, [facility]   Therapeutic Triage   [facility]

## 2015-06-18 NOTE — Progress Notes (Signed)
LCSW met with patient when security called and made LCSW aware that patient was in ED. This Probation officer is very familiar with patient and has worked with him numerous times in the past. LCSW met with patient at bedside. Patient reports he was eating at [facility] and commented that he was suicidal and reports he was taken into custody under IVC. Patient reports he wanted to run, however he knew if he did he would catch a charge, so he complied with authority and came to ED.  Patient reports he was not suicidal. Reports all he wanted was bus passes and could not get any.   Patient has been active with [facility] in the past. Merry Proud was his case worker and is no longer with [facility]. Still goes to [facility] for medication. NO therapy due to his recent aggression towards therapist.  Patient continues to live in [place] with his wife. Reports wife was in [place] as well when he was brought to hospital.  Reports he is no longer with his ACT team, but had a Haldol injection on Wednesday April 26th.  (this is not confirmed nor is not having an ACT team.   Reports he has no new legal charges at this time.  Reports he wants to get out of ED and wants to leave and go home.  [employer] at bedside, reports no behaviors at this time.   Patient aware of all his community resources.  Declines needs at this time. Understands where to get food and shelter information if needed. Reports his brother is part of his care more than his sister who has always been his payee in the past.  Lane Hacker, MSW Clinical Social Work: [place] [phone]

## 2015-06-18 NOTE — BHH Suicide Risk Assessment (Signed)
Suicide Risk Assessment  Discharge Assessment   [facility]   Principal Problem: Schizoaffective disorder, bipolar type [facility](HCC) Discharge Diagnoses:  Patient Active Problem List   Diagnosis Date Noted  . Schizoaffective disorder, bipolar type (HCC) [F25.0] 04/28/2015    Priority: High  . Cannabis use disorder, severe, dependence (HCC) [F12.20] 06/18/2015  . Behavior disturbance [F91.9]   . Schizophrenia (HCC) [F20.9] 10/10/2014    Total Time spent with patient: 45 minutes  Musculoskeletal: Strength & Muscle Tone: within normal limits Gait & Station: normal Patient leans: N/A  Psychiatric Specialty Exam: Review of Systems  Constitutional: Negative.   HENT: Negative.   Eyes: Negative.   Respiratory: Negative.   Cardiovascular: Negative.   Gastrointestinal: Negative.   Genitourinary: Negative.   Musculoskeletal: Negative.   Skin: Negative.   Neurological: Negative.   Endo/Heme/Allergies: Negative.   Psychiatric/Behavioral: Negative.     Blood pressure 116/82, pulse 82, temperature 97.9 F (36.6 C), temperature source Oral, resp. rate 16, SpO2 100 %.There is no weight on file to calculate BMI.  General Appearance: Casual  Eye Contact::  Good  Speech:  Normal Rate  Volume:  Normal  Mood:  Euthymic  Affect:  Congruent  Thought Process:  Coherent  Orientation:  Full (Time, Place, and Person)  Thought Content:  WDL  Suicidal Thoughts:  No  Homicidal Thoughts:  No  Memory:  Immediate;   Good Recent;   Good Remote;   Good  Judgement:  Fair  Insight:  Fair  Psychomotor Activity:  Normal  Concentration:  Good  Recall:  Good  Fund of Knowledge:Good  Language: Good  Akathisia:  No  Handed:  Right  AIMS (if indicated):     Assets:  Housing Leisure Time Physical Health Resilience Social Support  ADL's:  Intact  Cognition: WNL  Sleep:      Mental Status Per Nursing Assessment::   On Admission:   Suicidal ideations with substance  abuse  Demographic Factors:  Male  Loss Factors: NA  Historical Factors: Family history of mental illness or substance abuse  Risk Reduction Factors:   Sense of responsibility to family, Living with another person, especially a relative, Positive social support and Positive therapeutic relationship  Continued Clinical Symptoms:  None  Cognitive Features That Contribute To Risk:  None    Suicide Risk:  Minimal: No identifiable suicidal ideation.  Patients presenting with no risk factors but with morbid ruminations; may be classified as minimal risk based on the severity of the depressive symptoms   Plan Of Care/Follow-up recommendations:  Activity:  as tolerated  Diet:  heart healthy diet  LORD, JAMISON, NP 06/18/2015, 11:11 AM

## 2015-06-18 NOTE — ED Notes (Signed)
Patient taking shower-[employer] at bedside

## 2015-06-18 NOTE — Consult Note (Signed)
[facility] Face-to-Face Psychiatry Consult   Reason for Consult:  Substance abuse with suicidal ideations Referring Physician:  EDP Patient Identification: [name] [name] MRN:  [id number] Principal Diagnosis: Schizoaffective disorder, bipolar type [facility]) Diagnosis:   Patient Active Problem List   Diagnosis Date Noted  . Schizoaffective disorder, bipolar type (HCC) [F25.0] 04/28/2015    Priority: High  . Cannabis use disorder, severe, dependence (HCC) [F12.20] 06/18/2015  . Behavior disturbance [F91.9]   . Schizophrenia (HCC) [F20.9] 10/10/2014    Total Time spent with patient: 45 minutes  Subjective:   [name] [name] is a 40 y.o. male patient does not warrant admission.  HPI:  40 yo male who presented to the ED after using drugs and wanted to kill himself.  Today, he has cleared the drugs and does not feel this way.  Denies suicidal/homicidal ideations, hallucinations, and withdrawal symptoms.  He wants to leave and return to [facility] for his antipsychotic injectable.  Stable for discharge.  Kalid is going to take a shower and wants to get to [employer] for lunch, does not want lunch here.  Past Psychiatric History: schizoaffective disorder  Risk to Self: Suicidal Ideation: No Suicidal Intent: No Is patient at risk for suicide?: No Suicidal Plan?: No-Not Currently/Within Last 6 Months Specify Current Suicidal Plan: Cut self with knife Access to Means: Yes Specify Access to Suicidal Means: Access to anything sharp, knives What has been your use of drugs/alcohol within the last 12 months?: Hx of cocaine, Ecstasy, and Marijuana use How many times?: 4 Other Self Harm Risks: None known Triggers for Past Attempts: Unpredictable Intentional Self Injurious Behavior: None Risk to Others: Homicidal Ideation: No-Not Currently/Within Last 6 Months Thoughts of Harm to Others: No-Not Currently Present/Within Last 6 Months Comment - Thoughts of Harm to Others: Pt denies currently Current  Homicidal Intent: No Current Homicidal Plan: No Access to Homicidal Means: Yes Describe Access to Homicidal Means: Knives Identified Victim: None currently History of harm to others?: Yes Assessment of Violence: In past 6-12 months Violent Behavior Description: Hx of homicidal threats to multiple others, aggressive towards [employer] in past Does patient have access to weapons?: Yes (Comment) Criminal Charges Pending?: No Does patient have a court date: No Prior Inpatient Therapy: Prior Inpatient Therapy: Yes Prior Therapy Dates: Multiple Prior Therapy Facilty/Provider(s): [facility] Reason for Treatment: SI/HI, Psychosis Prior Outpatient Therapy: Prior Outpatient Therapy: Yes Prior Therapy Dates: 11/2014-04/2015 Prior Therapy Facilty/Provider(s): [facility] Reason for Treatment: ACTT Does patient have an ACCT team?: No ([facility] no longer seeing pt as of last month) Does patient have Intensive In-House Services?  : No Does patient have [facility] services? : No Does patient have P4CC services?: No  Past Medical History:  Past Medical History  Diagnosis Date  . Asthma   . Homeless   . Schizophrenia (HCC)   . Alcohol abuse   . Aggression   . Hypertension     Past Surgical History  Procedure Laterality Date  . Knee surgery    . Mouth surgery     Family History: History reviewed. No pertinent family history. Family Psychiatric  History: substance abuse Social History:  History  Alcohol Use  . Yes     History  Drug Use No    Comment: former    Social History   Social History  . Marital Status: Married    Spouse Name: N/A  . Number of Children: N/A  . Years of Education: N/A   Social History Main Topics  . Smoking status: Current Every  Day Smoker  . Smokeless tobacco: None  . Alcohol Use: Yes  . Drug Use: No     Comment: former  . Sexual Activity: Not Asked   Other Topics Concern  . None   Social History Narrative   Additional Social History:    Allergies:    Allergies  Allergen Reactions  . Peach Seed Swelling  . Sudafed [Pseudoephedrine] Swelling and Rash    Labs:  Results for orders placed or performed during the hospital encounter of 06/17/15 (from the past 48 hour(s))  Urine rapid drug screen (hosp performed) (Not at [facility])     Status: Abnormal   Collection Time: 06/17/15  8:24 PM  Result Value Ref Range   Opiates NONE DETECTED NONE DETECTED   Cocaine NONE DETECTED NONE DETECTED   Benzodiazepines NONE DETECTED NONE DETECTED   Amphetamines NONE DETECTED NONE DETECTED   Tetrahydrocannabinol POSITIVE (A) NONE DETECTED   Barbiturates NONE DETECTED NONE DETECTED    Comment:        DRUG SCREEN FOR MEDICAL PURPOSES ONLY.  IF CONFIRMATION IS NEEDED FOR ANY PURPOSE, NOTIFY LAB WITHIN 5 DAYS.        LOWEST DETECTABLE LIMITS FOR URINE DRUG SCREEN Drug Class       Cutoff (ng/mL) Amphetamine      1000 Barbiturate      200 Benzodiazepine   616 Tricyclics       073 Opiates          300 Cocaine          300 THC              50   Comprehensive metabolic panel     Status: Abnormal   Collection Time: 06/17/15  8:33 PM  Result Value Ref Range   Sodium 143 135 - 145 mmol/L   Potassium 3.5 3.5 - 5.1 mmol/L   Chloride 106 101 - 111 mmol/L   CO2 27 22 - 32 mmol/L   Glucose, Bld 121 (H) 65 - 99 mg/dL   BUN 9 6 - 20 mg/dL   Creatinine, Ser 1.13 0.61 - 1.24 mg/dL   Calcium 9.8 8.9 - 10.3 mg/dL   Total Protein 7.8 6.5 - 8.1 g/dL   Albumin 4.7 3.5 - 5.0 g/dL   AST 25 15 - 41 U/L   ALT 20 17 - 63 U/L   Alkaline Phosphatase 80 38 - 126 U/L   Total Bilirubin 1.2 0.3 - 1.2 mg/dL   GFR calc non Af Amer >60 >60 mL/min   GFR calc Af Amer >60 >60 mL/min    Comment: (NOTE) The eGFR has been calculated using the CKD EPI equation. This calculation has not been validated in all clinical situations. eGFR's persistently <60 mL/min signify possible Chronic Kidney Disease.    Anion gap 10 5 - 15  CBC     Status: None   Collection Time: 06/17/15   8:33 PM  Result Value Ref Range   WBC 9.8 4.0 - 10.5 K/uL   RBC 5.34 4.22 - 5.81 MIL/uL   Hemoglobin 16.7 13.0 - 17.0 g/dL   HCT 49.5 39.0 - 52.0 %   MCV 92.7 78.0 - 100.0 fL   MCH 31.3 26.0 - 34.0 pg   MCHC 33.7 30.0 - 36.0 g/dL   RDW 12.8 11.5 - 15.5 %   Platelets 201 150 - 400 K/uL  Ethanol (ETOH)     Status: None   Collection Time: 06/17/15  8:34 PM  Result Value Ref Range  Alcohol, Ethyl (B) <5 <5 mg/dL    Comment:        LOWEST DETECTABLE LIMIT FOR SERUM ALCOHOL IS 5 mg/dL FOR MEDICAL PURPOSES ONLY   Salicylate level     Status: None   Collection Time: 06/17/15  8:34 PM  Result Value Ref Range   Salicylate Lvl <7.3 2.8 - 30.0 mg/dL  Acetaminophen level     Status: Abnormal   Collection Time: 06/17/15  8:34 PM  Result Value Ref Range   Acetaminophen (Tylenol), Serum <10 (L) 10 - 30 ug/mL    Comment:        THERAPEUTIC CONCENTRATIONS VARY SIGNIFICANTLY. A RANGE OF 10-30 ug/mL MAY BE AN EFFECTIVE CONCENTRATION FOR MANY PATIENTS. HOWEVER, SOME ARE BEST TREATED AT CONCENTRATIONS OUTSIDE THIS RANGE. ACETAMINOPHEN CONCENTRATIONS >150 ug/mL AT 4 HOURS AFTER INGESTION AND >50 ug/mL AT 12 HOURS AFTER INGESTION ARE OFTEN ASSOCIATED WITH TOXIC REACTIONS.     Current Facility-Administered Medications  Medication Dose Route Frequency Provider Last Rate Last Dose  . benztropine (COGENTIN) tablet 1 mg  1 mg Oral Daily Montine Circle, PA-C   1 mg at 06/18/15 0021  . busPIRone (BUSPAR) tablet 15 mg  15 mg Oral TID Montine Circle, PA-C   15 mg at 06/18/15 1051  . hydrochlorothiazide (MICROZIDE) capsule 12.5 mg  12.5 mg Oral Daily Isla Pence, MD   12.5 mg at 06/18/15 7106  . lithium carbonate capsule 1,200 mg  1,200 mg Oral QHS Isla Pence, MD   1,200 mg at 06/18/15 0023  . traZODone (DESYREL) tablet 100 mg  100 mg Oral QHS Montine Circle, PA-C   100 mg at 06/18/15 0041  . ziprasidone (GEODON) capsule 60 mg  60 mg Oral BID WC Montine Circle, PA-C   60 mg at 06/18/15  2694   Current Outpatient Prescriptions  Medication Sig Dispense Refill  . busPIRone (BUSPAR) 10 MG tablet Take 1 tablet (10 mg total) by mouth 3 (three) times daily. 90 tablet 0  . haloperidol decanoate (HALDOL DECANOATE) 100 MG/ML injection Inject 150 mg into the muscle every 28 (twenty-eight) days.     . hydrochlorothiazide (HYDRODIURIL) 12.5 MG tablet Take 12.5 mg by mouth daily.    Marland Kitchen lithium carbonate 600 MG capsule Take 2 capsules (1,200 mg total) by mouth at bedtime. 60 capsule 0  . traZODone (DESYREL) 100 MG tablet Take 1 tablet (100 mg total) by mouth at bedtime. 30 tablet 0  . ziprasidone (GEODON) 60 MG capsule Take 1 capsule (60 mg total) by mouth 2 (two) times daily with a meal. 60 capsule 0  . benztropine (COGENTIN) 1 MG tablet Take 1 tablet (1 mg total) by mouth daily. (Patient not taking: Reported on 06/17/2015) 30 tablet 0  . benztropine (COGENTIN) 1 MG tablet Take 1 tablet (1 mg total) by mouth 2 (two) times daily. (Patient not taking: Reported on 06/17/2015) 30 tablet 0  . busPIRone (BUSPAR) 15 MG tablet Take 1 tablet (15 mg total) by mouth 3 (three) times daily. (Patient not taking: Reported on 06/17/2015) 90 tablet 0  . lithium 300 MG tablet Take 4 tablets (1,200 mg total) by mouth at bedtime. (Patient not taking: Reported on 06/17/2015) 120 tablet 0  . PROAIR HFA 108 (90 Base) MCG/ACT inhaler INHALE 2 PUFFS BY MOUTH EVERY 6 HOURS PRN FOR WHEEZING AND SOB  5    Musculoskeletal: Strength & Muscle Tone: within normal limits Gait & Station: normal Patient leans: N/A  Psychiatric Specialty Exam: Review of Systems  Constitutional: Negative.  HENT: Negative.   Eyes: Negative.   Respiratory: Negative.   Cardiovascular: Negative.   Gastrointestinal: Negative.   Genitourinary: Negative.   Musculoskeletal: Negative.   Skin: Negative.   Neurological: Negative.   Endo/Heme/Allergies: Negative.   Psychiatric/Behavioral: Negative.     Blood pressure 116/82, pulse 82,  temperature 97.9 F (36.6 C), temperature source Oral, resp. rate 16, SpO2 100 %.There is no weight on file to calculate BMI.  General Appearance: Casual  Eye Contact::  Good  Speech:  Normal Rate  Volume:  Normal  Mood:  Euthymic  Affect:  Congruent  Thought Process:  Coherent  Orientation:  Full (Time, Place, and Person)  Thought Content:  WDL  Suicidal Thoughts:  No  Homicidal Thoughts:  No  Memory:  Immediate;   Good Recent;   Good Remote;   Good  Judgement:  Fair  Insight:  Fair  Psychomotor Activity:  Normal  Concentration:  Good  Recall:  Good  Fund of Knowledge:Good  Language: Good  Akathisia:  No  Handed:  Right  AIMS (if indicated):     Assets:  Housing Leisure Time Physical Health Resilience Social Support  ADL's:  Intact  Cognition: WNL  Sleep:      Treatment Plan Summary: Daily contact with patient to assess and evaluate symptoms and progress in treatment, Medication management and Plan schizoaffective disorder, bipolar type: -Crisis stabilization -Medication management:  Continued his Cogentin 1 mg daily for EPS, Buspar 15 mg TID for anxiety, Lithium 1200 mg at bedtime for mood stabilization, Trazodone 100 mg at bedtime for sleep, and Geodon 60 mg BID for psychosis.  Refer to [facility] to continue with his antipsychotic injectables -Individual and substance abuse counseling  Disposition: No evidence of imminent risk to self or others at present.    Waylan Boga, NP 06/18/2015 10:55 AM Patient seen face-to-face for psychiatric evaluation, chart reviewed and case discussed with the physician extender and developed treatment plan. Reviewed the information documented and agree with the treatment plan. Corena Pilgrim, MD

## 2015-06-18 NOTE — ED Notes (Signed)
Patient is resting comfortably. Sitter at bedside.  

## 2015-09-09 ENCOUNTER — Emergency Department (HOSPITAL_COMMUNITY)
Admission: EM | Admit: 2015-09-09 | Discharge: 2015-09-11 | Disposition: A | Discharge status: Home / Self Care | Payer: Medicaid Other | Attending: Emergency Medicine | Admitting: Emergency Medicine

## 2015-09-09 ENCOUNTER — Encounter (HOSPITAL_COMMUNITY): Payer: Self-pay

## 2015-09-09 DIAGNOSIS — I10 Essential (primary) hypertension: Secondary | ICD-10-CM | POA: Diagnosis not present

## 2015-09-09 DIAGNOSIS — F209 Schizophrenia, unspecified: Secondary | ICD-10-CM | POA: Insufficient documentation

## 2015-09-09 DIAGNOSIS — F17200 Nicotine dependence, unspecified, uncomplicated: Secondary | ICD-10-CM | POA: Diagnosis not present

## 2015-09-09 DIAGNOSIS — R45851 Suicidal ideations: Secondary | ICD-10-CM

## 2015-09-09 DIAGNOSIS — F909 Attention-deficit hyperactivity disorder, unspecified type: Secondary | ICD-10-CM | POA: Insufficient documentation

## 2015-09-09 DIAGNOSIS — F250 Schizoaffective disorder, bipolar type: Secondary | ICD-10-CM | POA: Diagnosis not present

## 2015-09-09 DIAGNOSIS — J45909 Unspecified asthma, uncomplicated: Secondary | ICD-10-CM | POA: Insufficient documentation

## 2015-09-09 DIAGNOSIS — F918 Other conduct disorders: Secondary | ICD-10-CM | POA: Insufficient documentation

## 2015-09-09 DIAGNOSIS — R4789 Other speech disturbances: Secondary | ICD-10-CM | POA: Diagnosis not present

## 2015-09-09 DIAGNOSIS — Z79899 Other long term (current) drug therapy: Secondary | ICD-10-CM | POA: Diagnosis not present

## 2015-09-09 DIAGNOSIS — F1220 Cannabis dependence, uncomplicated: Secondary | ICD-10-CM | POA: Diagnosis present

## 2015-09-09 LAB — COMPREHENSIVE METABOLIC PANEL
ALT: 17 U/L (ref 17–63)
AST: 21 U/L (ref 15–41)
Albumin: 4.6 g/dL (ref 3.5–5.0)
Alkaline Phosphatase: 82 U/L (ref 38–126)
Anion gap: 8 (ref 5–15)
BUN: 8 mg/dL (ref 6–20)
CO2: 24 mmol/L (ref 22–32)
Calcium: 9.5 mg/dL (ref 8.9–10.3)
Chloride: 107 mmol/L (ref 101–111)
Creatinine, Ser: 1.16 mg/dL (ref 0.61–1.24)
GFR calc Af Amer: >60 mL/min (ref >60)
GFR calc non Af Amer: >60 mL/min (ref >60)
Glucose, Bld: 98 mg/dL (ref 65–99)
Potassium: 3.6 mmol/L (ref 3.5–5.1)
Sodium: 139 mmol/L (ref 135–145)
Total Bilirubin: 1.3 mg/dL — ABNORMAL HIGH (ref 0.3–1.2)
Total Protein: 7.7 g/dL (ref 6.5–8.1)

## 2015-09-09 LAB — CBC
HCT: 50.3 % (ref 39.0–52.0)
Hemoglobin: 16.5 g/dL (ref 13.0–17.0)
MCH: 30.7 pg (ref 26.0–34.0)
MCHC: 32.8 g/dL (ref 30.0–36.0)
MCV: 93.7 fL (ref 78.0–100.0)
Platelets: 202 10*3/uL (ref 150–400)
RBC: 5.37 MIL/uL (ref 4.22–5.81)
RDW: 13 % (ref 11.5–15.5)
WBC: 10.8 10*3/uL — ABNORMAL HIGH (ref 4.0–10.5)

## 2015-09-09 LAB — SALICYLATE LEVEL: Salicylate Lvl: <4 mg/dL (ref 2.8–30.0)

## 2015-09-09 LAB — ACETAMINOPHEN LEVEL: Acetaminophen (Tylenol), Serum: <10 ug/mL — ABNORMAL LOW (ref 10–30)

## 2015-09-09 LAB — RAPID URINE DRUG SCREEN, HOSP PERFORMED
Amphetamines: NOT DETECTED
Barbiturates: NOT DETECTED
Benzodiazepines: NOT DETECTED
Cocaine: NOT DETECTED
Opiates: NOT DETECTED
Tetrahydrocannabinol: POSITIVE — AB

## 2015-09-09 LAB — LITHIUM LEVEL: Lithium Lvl: <0.06 mmol/L — ABNORMAL LOW (ref 0.60–1.20)

## 2015-09-09 LAB — ETHANOL: Alcohol, Ethyl (B): <5 mg/dL (ref <5)

## 2015-09-09 MED ORDER — OLANZAPINE 10 MG PO TBDP
10.0000 mg | ORAL_TABLET | Freq: Once | ORAL | Status: AC
  Administered 2015-09-09: 10 mg via ORAL
  Filled 2015-09-09: qty 1

## 2015-09-09 MED ORDER — ONDANSETRON HCL 4 MG PO TABS: 4.0000 mg | ORAL_TABLET | Freq: Three times a day (TID) | ORAL | Status: DC | PRN

## 2015-09-09 MED ORDER — NICOTINE 21 MG/24HR TD PT24
21.0000 mg | MEDICATED_PATCH | Freq: Every day | TRANSDERMAL | Status: DC
  Administered 2015-09-10 – 2015-09-11 (×2): 21 mg via TRANSDERMAL
  Filled 2015-09-09 (×2): qty 1

## 2015-09-09 MED ORDER — LITHIUM CARBONATE 300 MG PO CAPS: 1200.0000 mg | ORAL_CAPSULE | Freq: Every day | ORAL | Status: DC

## 2015-09-09 MED ORDER — ZIPRASIDONE HCL 20 MG PO CAPS
60.0000 mg | ORAL_CAPSULE | Freq: Two times a day (BID) | ORAL | Status: DC
  Administered 2015-09-09 – 2015-09-11 (×4): 60 mg via ORAL
  Filled 2015-09-09 (×5): qty 3

## 2015-09-09 MED ORDER — IBUPROFEN 200 MG PO TABS: 600.0000 mg | ORAL_TABLET | Freq: Three times a day (TID) | ORAL | Status: DC | PRN

## 2015-09-09 MED ORDER — OLANZAPINE 10 MG PO TBDP: 10.0000 mg | ORAL_TABLET | Freq: Every day | ORAL | Status: DC

## 2015-09-09 MED ORDER — HYDROCHLOROTHIAZIDE 25 MG PO TABS
12.5000 mg | ORAL_TABLET | Freq: Every day | ORAL | Status: DC
  Filled 2015-09-09: qty 0.5

## 2015-09-09 MED ORDER — TRAZODONE HCL 100 MG PO TABS
100.0000 mg | ORAL_TABLET | Freq: Every day | ORAL | Status: DC
  Administered 2015-09-10: 100 mg via ORAL
  Filled 2015-09-09: qty 1

## 2015-09-09 MED ORDER — DIPHENHYDRAMINE HCL 50 MG/ML IJ SOLN
INTRAMUSCULAR | Status: AC
  Administered 2015-09-09: 50 mg
  Filled 2015-09-09: qty 1

## 2015-09-09 MED ORDER — CHLORPROMAZINE HCL 25 MG/ML IJ SOLN
50.0000 mg | Freq: Once | INTRAMUSCULAR | Status: AC
  Administered 2015-09-09: 50 mg via INTRAMUSCULAR
  Filled 2015-09-09: qty 2

## 2015-09-09 MED ORDER — ALUM & MAG HYDROXIDE-SIMETH 200-200-20 MG/5ML PO SUSP: 30.0000 mL | ORAL | Status: DC | PRN

## 2015-09-09 MED ORDER — ACETAMINOPHEN 325 MG PO TABS: 650.0000 mg | ORAL_TABLET | ORAL | Status: DC | PRN

## 2015-09-09 MED ORDER — GABAPENTIN 400 MG PO CAPS
400.0000 mg | ORAL_CAPSULE | Freq: Four times a day (QID) | ORAL | Status: DC
  Filled 2015-09-09 (×2): qty 1

## 2015-09-09 MED ORDER — HYDROCHLOROTHIAZIDE 12.5 MG PO CAPS
12.5000 mg | ORAL_CAPSULE | Freq: Every day | ORAL | Status: DC
  Administered 2015-09-10: 12.5 mg via ORAL
  Filled 2015-09-09 (×3): qty 1

## 2015-09-09 MED ORDER — DIPHENHYDRAMINE HCL 50 MG/ML IJ SOLN: 50.0000 mg | Freq: Once | INTRAMUSCULAR | Status: DC

## 2015-09-09 MED ORDER — CARBAMAZEPINE 200 MG PO TABS: 200.0000 mg | ORAL_TABLET | Freq: Two times a day (BID) | ORAL | Status: DC

## 2015-09-09 NOTE — ED Provider Notes (Signed)
CSN: [id number]     Arrival date & time 09/09/15  1416 History   First MD Initiated Contact with Patient 09/09/15 1450     Chief Complaint  Patient presents with  . IVC   . Suicidal     (Consider location/radiation/quality/duration/timing/severity/associated sxs/prior Treatment) HPI Comments: 40 year old male here with suicidal ideations with a plan to take medications to overdose and sit in the bathtub until they become infected. He also has homicidal ideations towards his family. Patient told his mother about these thoughts and requested that she placement IVC which she did. [facility] and did receive his dose of Haldol prior to arrival. He admits to using cocaine over the past several days. Patient has extensive history of psychiatric disorders including schizophrenia. States that he has not been compliant with his psychiatric medications. States he has been responding to internal stimuli.  The history is provided by the patient and the police.    Past Medical History  Diagnosis Date  . Asthma   . Homeless   . Schizophrenia (HCC)   . Alcohol abuse   . Aggression   . Hypertension    Past Surgical History  Procedure Laterality Date  . Knee surgery    . Mouth surgery     History reviewed. No pertinent family history. Social History  Substance Use Topics  . Smoking status: Current Every Day Smoker  . Smokeless tobacco: None  . Alcohol Use: Yes    Review of Systems  All other systems reviewed and are negative.     Allergies  Peach seed and Sudafed  Home Medications   Prior to Admission medications   Medication Sig Start Date End Date Taking? Authorizing Provider  benztropine (COGENTIN) 1 MG tablet Take 1 tablet (1 mg total) by mouth daily. Patient not taking: Reported on 06/17/2015 04/07/15   Gwyneth SproutWhitney Plunkett, MD  benztropine (COGENTIN) 1 MG tablet Take 1 tablet (1 mg total) by mouth 2 (two) times daily. Patient not taking: Reported on 06/17/2015 04/29/15    Earney NavyJosephine C Onuoha, NP  busPIRone (BUSPAR) 10 MG tablet Take 1 tablet (10 mg total) by mouth 3 (three) times daily. 04/29/15   Earney NavyJosephine C Onuoha, NP  busPIRone (BUSPAR) 15 MG tablet Take 1 tablet (15 mg total) by mouth 3 (three) times daily. Patient not taking: Reported on 06/17/2015 04/07/15   Gwyneth SproutWhitney Plunkett, MD  haloperidol decanoate (HALDOL DECANOATE) 100 MG/ML injection Inject 150 mg into the muscle every 28 (twenty-eight) days.     Historical Provider, MD  hydrochlorothiazide (HYDRODIURIL) 12.5 MG tablet Take 12.5 mg by mouth daily.    Historical Provider, MD  lithium 300 MG tablet Take 4 tablets (1,200 mg total) by mouth at bedtime. Patient not taking: Reported on 06/17/2015 04/07/15   Gwyneth SproutWhitney Plunkett, MD  lithium carbonate 600 MG capsule Take 2 capsules (1,200 mg total) by mouth at bedtime. 04/29/15   Earney NavyJosephine C Onuoha, NP  PROAIR HFA 108 (90 Base) MCG/ACT inhaler INHALE 2 PUFFS BY MOUTH EVERY 6 HOURS PRN FOR WHEEZING AND SOB 05/21/15   Historical Provider, MD  traZODone (DESYREL) 100 MG tablet Take 1 tablet (100 mg total) by mouth at bedtime. 04/29/15   Earney NavyJosephine C Onuoha, NP  ziprasidone (GEODON) 60 MG capsule Take 1 capsule (60 mg total) by mouth 2 (two) times daily with a meal. 04/29/15   Earney NavyJosephine C Onuoha, NP   BP 136/96 mmHg  Pulse 87  Temp(Src) 98.3 F (36.8 C) (Oral)  Resp 18  SpO2 99% Physical  Exam  Constitutional: He is oriented to person, place, and time. He appears well-developed and well-nourished.  Non-toxic appearance. No distress.  HENT:  Head: Normocephalic and atraumatic.  Eyes: Conjunctivae, EOM and lids are normal. Pupils are equal, round, and reactive to light.  Neck: Normal range of motion. Neck supple. No tracheal deviation present. No thyroid mass present.  Cardiovascular: Normal rate, regular rhythm and normal heart sounds.  Exam reveals no gallop.   No murmur heard. Pulmonary/Chest: Effort normal and breath sounds normal. No stridor. No respiratory distress. He  has no decreased breath sounds. He has no wheezes. He has no rhonchi. He has no rales.  Abdominal: Soft. Normal appearance and bowel sounds are normal. He exhibits no distension. There is no tenderness. There is no rebound and no CVA tenderness.  Musculoskeletal: Normal range of motion. He exhibits no edema or tenderness.  Neurological: He is alert and oriented to person, place, and time. He has normal strength. No cranial nerve deficit or sensory deficit. GCS eye subscore is 4. GCS verbal subscore is 5. GCS motor subscore is 6.  Skin: Skin is warm and dry. No abrasion and no rash noted.  Psychiatric: His affect is labile. His speech is rapid and/or pressured. He is agitated, aggressive and hyperactive.  Nursing note and vitals reviewed.   ED Course  Procedures (including critical care time) Labs Review Labs Reviewed  CBC - Abnormal; Notable for the following:    WBC 10.8 (*)    All other components within normal limits  COMPREHENSIVE METABOLIC PANEL  ETHANOL  SALICYLATE LEVEL  ACETAMINOPHEN LEVEL  URINE RAPID DRUG SCREEN, HOSP PERFORMED  LITHIUM LEVEL    Imaging Review No results found. I have personally reviewed and evaluated these images and lab results as part of my medical decision-making.   EKG Interpretation None      MDM   Final diagnoses:  None    Patient to have medical clearance labs performed and will be seen by TTS for disposition  Lorre Nick, MD 09/09/15 [id number]

## 2015-09-09 NOTE — ED Notes (Signed)
Pt presents w/ [employer] after being IVC'd by his mother.  Per IVC paperwork, Pt is a danger to himself and others.  Pt diagnosed w/ schizophrenia.  He has been institutionalized in the past.  Pt attempted to take an overdose of pill but was stopped by his mother.  Pt reported that the voices are telling him to hurt people and that if somebody doesn't help him, then he is going to hurt his girlfriend, her granddaughter, and kill himself.

## 2015-09-09 NOTE — ED Notes (Signed)
Pt reports that he is SI w/ plan to "take a bunch of pills and sit in a bathtub until they kick in."  Pt reports he is SI, because "[facility]  Sts "[facility] me that I was being sexually inappropriate w/ someone, but I wasn't."    Pt reports that he received his [facility]" [facility] today.

## 2015-09-09 NOTE — ED Notes (Signed)
Patient awoke and ambulatory to restroom [facility]

## 2015-09-09 NOTE — BH Assessment (Signed)
Tele Assessment Note   [name] [name] is an 40 y.o. male, African American , Married with suicidal ideations with a plan to take medications to overdose and sit in the bathtub until they become infected. He also has homicidal ideations towards his family. Patient told his mother about these thoughts and requested that she placement IVC which she did. Patient was at [facility] and did receive his dose of Haldol prior to arrival. He admits to using cocaine over the past several days. Patient has extensive history of psychiatric disorders including schizophrenia. States that he has not been compliant with his psychiatric medications. States he has been responding to internal stimuli. Patient states that primary concern is to seek help for mental health and substance abuse. Patient acknowledges history of psychotic symptoms with paranoia and AVH. Patient wife was present during assessment, and patient currently resides with wife. Patient states that sleep has declined with 4 or less hours of sleep per night. Patient stated that he is med compliant, but only with monthly Haldol injections. Patient acknowledges history of receiving ACTT team services, but declined and discontinued on numerous occassions due to aggression.   Patient acknowledges current SI with plan to sit in bathtub and absorb pills/ medication. Patient acknowledges history of SI. Patient acknowledges current HI towards "[facility]" with unspecified means, and pt acknowledges a history of aggression. Patient acknowledges current AVH with AH commands to hurt self or others stating that he fights the voices. Patient acknowledges history of Substance abuse with cocaine [age of first use 24 with unknown amounts at random frequencies with last use on 09/07/15 and with marijuana last use on 09/06/15 for 3-4 blunts per day average since the age of 40 with longest period of sobriety 6 years. Patient acknowledges history of inpatient psychiatric care with numerous  hospitalizations in 2017 and before for schizophrenia. Patient acknowledges current outpatient treatment with [facility].  atient is dressed in scrubs and is alert and oriented x4. Patient speech was circular in nature, but within normal limits and motor behavior appeared exaggerated [pt was in 4 pt. restraints during assessment].Patient thought process is coherent. Patient  does not appear to be responding to internal stimuli. Patient was cooperative throughout the assessment and states that he is agreeable to inpatient psychiatric treatment.   Diagnosis: Schizophrenia  Past Medical History:  Past Medical History  Diagnosis Date  . Asthma   . Homeless   . Schizophrenia (HCC)   . Alcohol abuse   . Aggression   . Hypertension     Past Surgical History  Procedure Laterality Date  . Knee surgery    . Mouth surgery      Family History: History reviewed. No pertinent family history.  Social History:  reports that he has been smoking.  He does not have any smokeless tobacco history on file. He reports that he drinks alcohol. He reports that he does not use illicit drugs.  Additional Social History:  Alcohol / Drug Use Pain Medications: SEE MAR Prescriptions: SEE MAR Over the Counter: SEE MAR History of alcohol / drug use?: Yes Longest period of sobriety (when/how long): 6 years  Negative Consequences of Use: Financial, Legal, Personal relationships Withdrawal Symptoms: Patient aware of relationship between substance abuse and physical/medical complications Substance #1 Name of Substance 1: cocaine 1 - Age of First Use: 24 1 - Amount (size/oz): unknown 1 - Frequency: random 1 - Duration: years 1 - Last Use / Amount: 09/07/15 Substance #2 Name of Substance 2: Marijuana 2 -  Age of First Use: 14 2 - Amount (size/oz): 3-4 blunts 2 - Frequency: random 2 - Duration: years 2 - Last Use / Amount: 09/06/15  CIWA: CIWA-Ar BP: 141/90 mmHg Pulse Rate: 80 COWS:     PATIENT STRENGTHS: (choose at least two) Average or above average intelligence Capable of independent living  Allergies:  Allergies  Allergen Reactions  . Peach Seed Swelling  . Sudafed [Pseudoephedrine] Swelling and Rash    Home Medications:  (Not in a hospital admission)  OB/GYN Status:  No LMP for male patient.  General Assessment Data Location of Assessment: [facility] TTS Assessment: In system Is this a Tele or Face-to-Face Assessment?: Tele Assessment Is this an Initial Assessment or a Re-assessment for this encounter?: Initial Assessment Marital status: Married Paxtang name: n/a Is patient pregnant?: No Pregnancy Status: No Living Arrangements: Spouse/significant other Can pt return to current living arrangement?: Yes Admission Status: Involuntary Is patient capable of signing voluntary admission?: Yes Referral Source: Self/Family/Friend Insurance type: Medicaid     Crisis Care Plan Living Arrangements: Spouse/significant other Name of Psychiatrist: Transport planner Name of Therapist: Transport planner  Education Status Is patient currently in school?: No Current Grade: n/a Highest grade of school patient has completed: unspecified Name of school: unknown Contact person: [name] [name], Wife  Risk to self with the past 6 months Suicidal Ideation: Yes-Currently Present Has patient been a risk to self within the past 6 months prior to admission? : Yes Suicidal Intent: Yes-Currently Present Has patient had any suicidal intent within the past 6 months prior to admission? : No Is patient at risk for suicide?: Yes Suicidal Plan?: Yes-Currently Present Has patient had any suicidal plan within the past 6 months prior to admission? : Yes Specify Current Suicidal Plan: take pills / absorb in bathtub Access to Means: Yes Specify Access to Suicidal Means: access to bath and medication What has been your use of drugs/alcohol within the last 12 months?: cocaine, Marijuana Previous  Attempts/Gestures: Yes How many times?: 3 Other Self Harm Risks: no Triggers for Past Attempts: Unpredictable Intentional Self Injurious Behavior: None Family Suicide History: Unknown Recent stressful life event(s): Conflict (Comment) Persecutory voices/beliefs?: Yes Depression: Yes Depression Symptoms: Feeling angry/irritable Substance abuse history and/or treatment for substance abuse?: Yes Suicide prevention information given to non-admitted patients: Yes  Risk to Others within the past 6 months Homicidal Ideation: Yes-Currently Present Does patient have any lifetime risk of violence toward others beyond the six months prior to admission? : Yes (comment) Thoughts of Harm to Others: Yes-Currently Present Comment - Thoughts of Harm to Others: [facility] Current Homicidal Intent: Yes-Currently Present Current Homicidal Plan: Yes-Currently Present Describe Current Homicidal Plan: no direcdt given, just [facility] Access to Homicidal Means: Yes Describe Access to Homicidal Means: any can be used Identified Victim: [facility] History of harm to others?: Yes Assessment of Violence: On admission Violent Behavior Description:  (hitting others) Does patient have access to weapons?: No Criminal Charges Pending?: Yes Describe Pending Criminal Charges: communicate threats upcoming court Does patient have a court date: Yes Court Date: 10/20/15 Is patient on probation?: No  Psychosis Hallucinations: Auditory, Visual, With command Delusions: Grandiose, Persecutory  Mental Status Report Appearance/Hygiene: In scrubs Eye Contact: Fair Motor Activity: Agitation Speech: Rapid, Pressured, Loud Level of Consciousness: Alert Mood: Angry Affect: Angry, Anxious Anxiety Level: Severe Thought Processes: Circumstantial, Flight of Ideas Judgement: Impaired Orientation: Person, Place, Time, Situation, Appropriate for developmental age Obsessive Compulsive Thoughts/Behaviors: None  Cognitive  Functioning Concentration: Normal Memory: Recent Intact, Remote Intact IQ:  Average Insight: Poor Impulse Control: Poor Appetite: Fair Weight Loss: 0 Weight Gain: 0 Sleep: Decreased Total Hours of Sleep: 4 Vegetative Symptoms: None  ADLScreening [facility] Assessment Services) Patient's cognitive ability adequate to safely complete daily activities?: Yes Patient able to express need for assistance with ADLs?: Yes Independently performs ADLs?: Yes (appropriate for developmental age)  Prior Inpatient Therapy Prior Inpatient Therapy: Yes Prior Therapy Dates: multiple Prior Therapy Facilty/Provider(s): [facility] [place], [place] Reason for Treatment: Schizophrenia  Prior Outpatient Therapy Prior Outpatient Therapy: Yes Prior Therapy Dates: current Prior Therapy Facilty/Provider(s): [facility] Reason for Treatment: Schizophrenia Does patient have an ACCT team?: No (has had in past declined various due to agressive behavior) Does patient have Intensive In-House Services?  : No Does patient have [facility] services? : Yes Does patient have P4CC services?: No  ADL Screening (condition at time of admission) Patient's cognitive ability adequate to safely complete daily activities?: Yes Is the patient deaf or have difficulty hearing?: No Does the patient have difficulty seeing, even when wearing glasses/contacts?: No Does the patient have difficulty concentrating, remembering, or making decisions?: No Patient able to express need for assistance with ADLs?: Yes Does the patient have difficulty dressing or bathing?: No Independently performs ADLs?: Yes (appropriate for developmental age) Does the patient have difficulty walking or climbing stairs?: No Weakness of Legs: None Weakness of Arms/Hands: None  Home Assistive Devices/Equipment Home Assistive Devices/Equipment: None    Abuse/Neglect Assessment (Assessment to be complete while patient is alone) Physical Abuse: Denies Verbal  Abuse: Denies Sexual Abuse: Denies Exploitation of patient/patient's resources: Denies Self-Neglect: Denies Values / Beliefs Cultural Requests During Hospitalization: None Spiritual Requests During Hospitalization: None   Advance Directives (For Healthcare) Does patient have an advance directive?: No Would patient like information on creating an advanced directive?: No - patient declined information    Additional Information 1:1 In Past 12 Months?: Yes CIRT Risk: Yes Elopement Risk: Yes Does patient have medical clearance?: Yes     Disposition: Per [name] Means, NP meets inpt criteria. Disposition Initial Assessment Completed for this Encounter: Yes Disposition of Patient: Inpatient treatment program Type of inpatient treatment program: Adult  Hipolito Bayley 09/09/2015 5:55 PM

## 2015-09-09 NOTE — ED Notes (Signed)
On admission to the [facility] pt was immediately distressed and anxious because he thought he was being put in a padded room.

## 2015-09-09 NOTE — Progress Notes (Signed)
Per [name] MeansJamison Lord, meets inpatient criteria, recommends [facility] referral. [name], [name], [name], [facility]  Counselor 09/09/2015 6:10 PM

## 2015-09-09 NOTE — ED Notes (Signed)
Meal tray heated up and given to patient. Encouragement and support provided and safety maintain. Q 15 min safety checks remain in place. [employer] officer 1:1 monitored.

## 2015-09-10 DIAGNOSIS — F250 Schizoaffective disorder, bipolar type: Secondary | ICD-10-CM

## 2015-09-10 DIAGNOSIS — R45851 Suicidal ideations: Secondary | ICD-10-CM

## 2015-09-10 MED ORDER — GABAPENTIN 400 MG PO CAPS
800.0000 mg | ORAL_CAPSULE | Freq: Two times a day (BID) | ORAL | Status: DC
  Filled 2015-09-10: qty 2

## 2015-09-10 MED ORDER — BENZTROPINE MESYLATE 1 MG PO TABS
1.0000 mg | ORAL_TABLET | Freq: Two times a day (BID) | ORAL | Status: DC
  Administered 2015-09-10 – 2015-09-11 (×3): 1 mg via ORAL
  Filled 2015-09-10 (×3): qty 1

## 2015-09-10 MED ORDER — LORAZEPAM 2 MG/ML IJ SOLN
2.0000 mg | Freq: Once | INTRAMUSCULAR | Status: AC
  Administered 2015-09-10: 2 mg via INTRAMUSCULAR
  Filled 2015-09-10: qty 1

## 2015-09-10 MED ORDER — LITHIUM CARBONATE 300 MG PO CAPS
600.0000 mg | ORAL_CAPSULE | Freq: Every day | ORAL | Status: DC
  Administered 2015-09-10: 600 mg via ORAL
  Filled 2015-09-10: qty 2

## 2015-09-10 MED ORDER — HALOPERIDOL DECANOATE 100 MG/ML IM SOLN: 150.0000 mg | INTRAMUSCULAR | Status: DC

## 2015-09-10 MED ORDER — ZIPRASIDONE MESYLATE 20 MG IM SOLR
10.0000 mg | Freq: Once | INTRAMUSCULAR | Status: AC
  Administered 2015-09-10: 10 mg via INTRAMUSCULAR
  Filled 2015-09-10: qty 20

## 2015-09-10 MED ORDER — DIPHENHYDRAMINE HCL 50 MG/ML IJ SOLN
50.0000 mg | Freq: Once | INTRAMUSCULAR | Status: AC
  Administered 2015-09-10: 50 mg via INTRAMUSCULAR
  Filled 2015-09-10: qty 1

## 2015-09-10 NOTE — ED Notes (Signed)
[employer] within sight on unit with patient.

## 2015-09-10 NOTE — ED Notes (Addendum)
Pt entered the [facility] and he presented with paranoid thinking. He said loudly and angrily, "What are you doing? You're putting me in a padded room here."  Reassurance that he is not being put in a padded room did not calm him down. He stomped around the unit, looking into other rooms and he approached a male patient. He said, "[facility] He was accompanied by 3 police officers (2 off duty and one assigned to him), and 3 security guards.  He would not go to his room. He demanded to be put in restraints before he hurt someone. He said, "I'm going to go off, and I might hurt someone."  Staff attempted to talk with pt to reason with him and calm him down, but he would not calm down. He went into the day room that was open and threw himself onto the couch and began to rock back and forth and kick saying, "I cannot handle this."  This Clinical research associatewriter talked with [name] MeansJamison Lord, NP, four point rigid restraints were ordered in addition to medication to help pt calm himself down. With assistance of the police and staff pt was put in restraints. His wife was also present as a calming presence. Pt stayed in restraints (see restraint log) for about an hour. Injections given (see mar) and pt felt calm enough, and in control enough,  to allow restraints to be removed.  He soon fell asleep and his wife left.

## 2015-09-10 NOTE — ED Notes (Signed)
Patient came to me asking for a shot to calm down.  I stated he had nothing ordered, but he persisted and started to become agitated.  Spoke with Dr. Mervyn SkeetersA and received orders for lorazepam, ziprasidone and diphenhydramine.  When I went to the patients room with them he became very agitated and stated he was not going to take them.  [employer] and security were present.  He was made to understand that if he did not take the shots willingly he would have to be held down.  He finally consented to the 3 injections and allowed myself and another nurse to administer them.  [employer] remains at the bedside at all times.

## 2015-09-10 NOTE — ED Notes (Signed)
Patient noted in room with visiter. No complaints, stable, in no acute distress. Q15 minute rounds and monitoring via security cameras continue for safety.

## 2015-09-10 NOTE — Consult Note (Signed)
[facility] Consult   Reason for Consult:  Psychiatric Evaluation Referring Physician:  EDP Patient Identification: [name] [name] MRN:  [id number] Principal Diagnosis: Schizoaffective disorder, bipolar type [facility]) Diagnosis:   Patient Active Problem List   Diagnosis Date Noted  . Cannabis use disorder, severe, dependence ([place]) [F12.20] 06/18/2015  . Behavior disturbance [F91.9]   . Schizoaffective disorder, bipolar type ([place]) [F25.0] 04/28/2015  . Schizophrenia ([place]) [F20.9] 10/10/2014    Total Time spent with patient: 45 minutes  Subjective:   [name] [name] is a 40 y.o. male patient who states "put me in the hospital."  HPI:  [name] [name] is a 40 yo African American male who presented [facility] after being IVC'd by his mother. The IVC states "patient is a danger to himself and others. Patient is diagnosed with schizophrenia. He has been institutionalized in the past. Patient attempted to take an overdose of pills but was stopped by his mother. Patient reported that the voices are telling him to hurt people and that if somebody doesn't help him, then he is going to hurt his girlfriend, her granddaughter, and kill himself."  He is seen today face-to-face with Dr. Darleene Cleaver and nurse practitioner. Patient is requesting inpatient hospitalization. States he was seen at [facility] yesterday and was called into the office by the director "for talking sexually inappropriate last." He states he got upset "because they were lying on me." He states he got his Haldol injection and was going to the leave but he was told by the director that he could not leave until he saw someone else. He said this made him really upset. He called his girlfriend but couldn't reach her, his brother didn't answer so he called mother and told her "to admit me to the hospital because I am about to take pills and drown in bathtub."  He continues to endorse suicidal ideation. He denies homicidal  ideation, intent or plan.   Past Psychiatric History: Schizophrenia  Risk to Self: Suicidal Ideation: Yes-Currently Present Suicidal Intent: Yes-Currently Present Is patient at risk for suicide?: Yes Suicidal Plan?: Yes-Currently Present Specify Current Suicidal Plan: take pills / absorb in bathtub Access to Means: Yes Specify Access to Suicidal Means: access to bath and medication What has been your use of drugs/alcohol within the last 12 months?: cocaine, Marijuana How many times?: 3 Other Self Harm Risks: no Triggers for Past Attempts: Unpredictable Intentional Self Injurious Behavior: None Risk to Others: Homicidal Ideation: Yes-Currently Present Thoughts of Harm to Others: Yes-Currently Present Comment - Thoughts of Harm to Others: [facility] Current Homicidal Intent: Yes-Currently Present Current Homicidal Plan: Yes-Currently Present Describe Current Homicidal Plan: no direcdt given, just [facility] Access to Homicidal Means: Yes Describe Access to Homicidal Means: any can be used Identified Victim: [facility] History of harm to others?: Yes Assessment of Violence: On admission Violent Behavior Description:  (hitting others) Does patient have access to weapons?: No Criminal Charges Pending?: Yes Describe Pending Criminal Charges: communicate threats upcoming court Does patient have a court date: Yes Court Date: 10/20/15 Prior Inpatient Therapy: Prior Inpatient Therapy: Yes Prior Therapy Dates: multiple Prior Therapy Facilty/Provider(s): [place] [place], [place], [place] Reason for Treatment: Schizophrenia Prior Outpatient Therapy: Prior Outpatient Therapy: Yes Prior Therapy Dates: current Prior Therapy Facilty/Provider(s): [facility] Reason for Treatment: Schizophrenia Does patient have an ACCT team?: No (has had in past declined various due to agressive behavior) Does patient have Intensive In-House Services?  : No Does patient have [facility] services? : Yes Does patient have  [facility]  services?: No  Past Medical History:  Past Medical History  Diagnosis Date  . Asthma   . Homeless   . Schizophrenia ([place])   . Alcohol abuse   . Aggression   . Hypertension     Past Surgical History  Procedure Laterality Date  . Knee surgery    . Mouth surgery     Family History: History reviewed. No pertinent family history. Family Psychiatric  History: Unknown Social History:  History  Alcohol Use  . Yes     History  Drug Use No    Comment: former    Social History   Social History  . Marital Status: Married    Spouse Name: N/A  . Number of Children: N/A  . Years of Education: N/A   Social History Main Topics  . Smoking status: Current Every Day Smoker  . Smokeless tobacco: None  . Alcohol Use: Yes  . Drug Use: No     Comment: former  . Sexual Activity: Not Asked   Other Topics Concern  . None   Social History Narrative   Additional Social History:    Allergies:   Allergies  Allergen Reactions  . Peach Seed Swelling  . Sudafed [Pseudoephedrine] Swelling and Rash    Labs:  Results for orders placed or performed during the hospital encounter of 09/09/15 (from the past 48 hour(s))  Comprehensive metabolic panel     Status: Abnormal   Collection Time: 09/09/15  2:49 PM  Result Value Ref Range   Sodium 139 135 - 145 mmol/L   Potassium 3.6 3.5 - 5.1 mmol/L   Chloride 107 101 - 111 mmol/L   CO2 24 22 - 32 mmol/L   Glucose, Bld 98 65 - 99 mg/dL   BUN 8 6 - 20 mg/dL   Creatinine, Ser 1.16 0.61 - 1.24 mg/dL   Calcium 9.5 8.9 - 10.3 mg/dL   Total Protein 7.7 6.5 - 8.1 g/dL   Albumin 4.6 3.5 - 5.0 g/dL   AST 21 15 - 41 U/L   ALT 17 17 - 63 U/L   Alkaline Phosphatase 82 38 - 126 U/L   Total Bilirubin 1.3 (H) 0.3 - 1.2 mg/dL   GFR calc non Af Amer >60 >60 mL/min   GFR calc Af Amer >60 >60 mL/min    Comment: (NOTE) The eGFR has been calculated using the CKD EPI equation. This calculation has not been validated in all clinical  situations. eGFR's persistently <60 mL/min signify possible Chronic Kidney Disease.    Anion gap 8 5 - 15  Ethanol     Status: None   Collection Time: 09/09/15  2:49 PM  Result Value Ref Range   Alcohol, Ethyl (B) <5 <5 mg/dL    Comment:        LOWEST DETECTABLE LIMIT FOR SERUM ALCOHOL IS 5 mg/dL FOR MEDICAL PURPOSES ONLY   Salicylate level     Status: None   Collection Time: 09/09/15  2:49 PM  Result Value Ref Range   Salicylate Lvl <3.8 2.8 - 30.0 mg/dL  Acetaminophen level     Status: Abnormal   Collection Time: 09/09/15  2:49 PM  Result Value Ref Range   Acetaminophen (Tylenol), Serum <10 (L) 10 - 30 ug/mL    Comment:        THERAPEUTIC CONCENTRATIONS VARY SIGNIFICANTLY. A RANGE OF 10-30 ug/mL MAY BE AN EFFECTIVE CONCENTRATION FOR MANY PATIENTS. HOWEVER, SOME ARE BEST TREATED AT CONCENTRATIONS OUTSIDE THIS RANGE. ACETAMINOPHEN CONCENTRATIONS >150 ug/mL AT  4 HOURS AFTER INGESTION AND >50 ug/mL AT 12 HOURS AFTER INGESTION ARE OFTEN ASSOCIATED WITH TOXIC REACTIONS.   cbc     Status: Abnormal   Collection Time: 09/09/15  2:49 PM  Result Value Ref Range   WBC 10.8 (H) 4.0 - 10.5 K/uL   RBC 5.37 4.22 - 5.81 MIL/uL   Hemoglobin 16.5 13.0 - 17.0 g/dL   HCT 34.0 68.4 - 03.3 %   MCV 93.7 78.0 - 100.0 fL   MCH 30.7 26.0 - 34.0 pg   MCHC 32.8 30.0 - 36.0 g/dL   RDW 53.3 17.4 - 09.9 %   Platelets 202 150 - 400 K/uL  Rapid urine drug screen (hospital performed)     Status: Abnormal   Collection Time: 09/09/15  2:52 PM  Result Value Ref Range   Opiates NONE DETECTED NONE DETECTED   Cocaine NONE DETECTED NONE DETECTED   Benzodiazepines NONE DETECTED NONE DETECTED   Amphetamines NONE DETECTED NONE DETECTED   Tetrahydrocannabinol POSITIVE (A) NONE DETECTED   Barbiturates NONE DETECTED NONE DETECTED    Comment:        DRUG SCREEN FOR MEDICAL PURPOSES ONLY.  IF CONFIRMATION IS NEEDED FOR ANY PURPOSE, NOTIFY LAB WITHIN 5 DAYS.        LOWEST DETECTABLE LIMITS FOR URINE  DRUG SCREEN Drug Class       Cutoff (ng/mL) Amphetamine      1000 Barbiturate      200 Benzodiazepine   200 Tricyclics       300 Opiates          300 Cocaine          300 THC              50   Lithium level     Status: Abnormal   Collection Time: 09/09/15  4:03 PM  Result Value Ref Range   Lithium Lvl <0.06 (L) 0.60 - 1.20 mmol/L    Current Facility-Administered Medications  Medication Dose Route Frequency Provider Last Rate Last Dose  . acetaminophen (TYLENOL) tablet 650 mg  650 mg Oral Q4H PRN Lorre Nick, MD      . alum & mag hydroxide-simeth (MAALOX/MYLANTA) 200-200-20 MG/5ML suspension 30 mL  30 mL Oral PRN Lorre Nick, MD      . benztropine (COGENTIN) tablet 1 mg  1 mg Oral BID Lutie Pickler, MD      . diphenhydrAMINE (BENADRYL) injection 50 mg  50 mg Intramuscular Once Charm Rings, NP   50 mg at 09/09/15 1748  . gabapentin (NEURONTIN) capsule 800 mg  800 mg Oral BID Xcaret Morad, MD      . hydrochlorothiazide (MICROZIDE) capsule 12.5 mg  12.5 mg Oral Daily Lorre Nick, MD   12.5 mg at 09/10/15 0927  . ibuprofen (ADVIL,MOTRIN) tablet 600 mg  600 mg Oral Q8H PRN Lorre Nick, MD      . lithium carbonate capsule 600 mg  600 mg Oral QHS Liset Mcmonigle, MD      . nicotine (NICODERM CQ - dosed in mg/24 hours) patch 21 mg  21 mg Transdermal Daily Lorre Nick, MD   21 mg at 09/10/15 0923  . ondansetron (ZOFRAN) tablet 4 mg  4 mg Oral Q8H PRN Lorre Nick, MD      . traZODone (DESYREL) tablet 100 mg  100 mg Oral QHS Lorre Nick, MD   100 mg at 09/10/15 0126  . ziprasidone (GEODON) capsule 60 mg  60 mg Oral BID WC Lorre Nick, MD  60 mg at 09/10/15 5797   Current Outpatient Prescriptions  Medication Sig Dispense Refill  . benztropine (COGENTIN) 1 MG tablet Take 1 tablet (1 mg total) by mouth 2 (two) times daily. 30 tablet 0  . haloperidol decanoate (HALDOL DECANOATE) 100 MG/ML injection Inject 150 mg into the muscle every 28 (twenty-eight) days.     Marland Kitchen lithium  carbonate 600 MG capsule Take 2 capsules (1,200 mg total) by mouth at bedtime. 60 capsule 0  . PROAIR HFA 108 (90 Base) MCG/ACT inhaler INHALE 2 PUFFS BY MOUTH EVERY 6 HOURS PRN FOR WHEEZING AND SOB  5  . traZODone (DESYREL) 100 MG tablet Take 1 tablet (100 mg total) by mouth at bedtime. 30 tablet 0  . ziprasidone (GEODON) 60 MG capsule Take 1 capsule (60 mg total) by mouth 2 (two) times daily with a meal. 60 capsule 0  . busPIRone (BUSPAR) 10 MG tablet Take 1 tablet (10 mg total) by mouth 3 (three) times daily. (Patient not taking: Reported on 09/09/2015) 90 tablet 0  . busPIRone (BUSPAR) 15 MG tablet Take 1 tablet (15 mg total) by mouth 3 (three) times daily. (Patient not taking: Reported on 06/17/2015) 90 tablet 0  . hydrochlorothiazide (HYDRODIURIL) 12.5 MG tablet Take 12.5 mg by mouth daily. Reported on 09/09/2015    . lithium 300 MG tablet Take 4 tablets (1,200 mg total) by mouth at bedtime. (Patient not taking: Reported on 06/17/2015) 120 tablet 0    Musculoskeletal: Strength & Muscle Tone: within normal limits Gait & Station: normal Patient leans: N/A  Psychiatric Specialty Exam: Physical Exam  Constitutional: He is oriented to person, place, and time. He appears well-developed and well-nourished.  HENT:  Head: Normocephalic.  Neck: Normal range of motion.  Respiratory: Effort normal.  Musculoskeletal: Normal range of motion.  Neurological: He is alert and oriented to person, place, and time.  Skin: Skin is warm and dry.    Review of Systems  Constitutional: Negative.   HENT: Negative.   Eyes: Negative.   Respiratory: Negative.   Cardiovascular: Negative.   Gastrointestinal: Negative.   Genitourinary: Negative.   Musculoskeletal: Negative.   Skin: Negative.   Psychiatric/Behavioral: Positive for suicidal ideas. The patient is nervous/anxious.     Blood pressure 141/90, pulse 80, temperature 98 F (36.7 C), temperature source Oral, resp. rate 20, SpO2 100 %.There is no  weight on file to calculate BMI.  General Appearance: Casual  Eye Contact:  Fair  Speech:  Clear and Coherent and Normal Rate  Volume:  Normal  Mood:  Anxious  Affect:  Congruent  Thought Process:  Coherent  Orientation:  Full (Time, Place, and Person)  Thought Content:  Rumination  Suicidal Thoughts:  Yes.  with intent/plan  Homicidal Thoughts:  No  Memory:  Immediate;   Good Recent;   Fair Remote;   Fair  Judgement:  Fair  Insight:  Fair  Psychomotor Activity:  Normal  Concentration:  Concentration: Fair and Attention Span: Fair  Recall:  [employer] of Knowledge:  Fair  Language:  Fair  Akathisia:  No  Handed:  Right  AIMS (if indicated):     Assets:  Communication Skills Desire for Improvement Housing Physical Health  ADL's:  Intact  Cognition:  WNL  Sleep:        Treatment Plan Summary: Daily contact with patient to assess and evaluate symptoms and progress in treatment and Medication management Continue home medications. Cogentin 1 mg PO BID for EPS Lithium 1,200 mg PO QHS  for mood Trazodone 100 mg PO QHS for insomnia Geodon 60 mg PO BID for schizophrenia   Disposition: Recommend psychiatric Inpatient admission when medically cleared. Supportive therapy provided about ongoing stressors.  Serena Colonel, FNP-BC [place] 09/10/2015 3:38 PM Patient seen face-to-face for psychiatric evaluation, chart reviewed and case discussed with the physician extender and developed treatment plan. Reviewed the information documented and agree with the treatment plan. Corena Pilgrim, MD

## 2015-09-10 NOTE — BH Assessment (Signed)
[facility] Assessment Progress Note  Per Thedore MinsMojeed Akintayo, MD, this pt requires psychiatric hospitalization at this time.  The following facilities have been contacted to seek placement for this pt, with results as noted:  Beds available, information sent, decision pending:  [facility]   [facility]   Doylene Canninghomas Tyrel Lex, [place] Triage Specialist [phone]

## 2015-09-10 NOTE — ED Notes (Addendum)
Patient has become beligerant over visiting hours.  He is demanding that his visitor stay.  Became threatening to this Clinical research associatewriter.  [employer] and security verbally deescalating.

## 2015-09-11 MED ORDER — ZIPRASIDONE HCL 60 MG PO CAPS: 60.0000 mg | ORAL_CAPSULE | Freq: Two times a day (BID) | ORAL | Status: DC

## 2015-09-11 MED ORDER — HYDROCHLOROTHIAZIDE 12.5 MG PO CAPS: 12.5000 mg | ORAL_CAPSULE | Freq: Every day | ORAL | Status: DC

## 2015-09-11 MED ORDER — LITHIUM CARBONATE 600 MG PO CAPS: 1200.0000 mg | ORAL_CAPSULE | Freq: Every day | ORAL | Status: DC

## 2015-09-11 MED ORDER — TRAZODONE HCL 100 MG PO TABS: 100.0000 mg | ORAL_TABLET | Freq: Every day | ORAL | Status: DC

## 2015-09-11 MED ORDER — BENZTROPINE MESYLATE 1 MG PO TABS: 1.0000 mg | ORAL_TABLET | Freq: Two times a day (BID) | ORAL | Status: DC

## 2015-09-11 NOTE — ED Notes (Signed)
During morning rounds with psychiatry team pt reports he is ready to be discharged home. Special checks q 15 mins in place for safety. Video monitoring in place.
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
## 2015-09-11 NOTE — ED Notes (Signed)
Pt discharged home per MD order. Pt reports he wants to be discharged, g/f verbalizes understanding. Pt denies SI/HI. Denies AVH. Discharge summary reviewed with pt. RX provided. Pt verbalizes understanding of discharge summary. Pt signed for personal belongings and belongings returned. Pt signed e-signature. Ambulatory off unit.

## 2015-09-11 NOTE — Clinical Social Work Note (Signed)
CSW provided rescinding paperwork per psychiatry request.  Patient is being discharged today.  Forms signed and placed in patient's chart.  Resources for [employer] provided to patient for follow up.  Elray Buba, LCSW [facility] Clinical Social Worker - Weekend Coverage cell #: [phone]

## 2015-09-11 NOTE — [facility] Suicide Risk Assessment (Signed)
Suicide Risk Assessment  Discharge Assessment   [facility] Discharge Suicide Risk Assessment   Principal Problem: Schizoaffective disorder, bipolar type [facility]) Discharge Diagnoses:  Patient Active Problem List   Diagnosis Date Noted  . Cannabis use disorder, severe, dependence ([facility]) [F12.20] 06/18/2015  . Behavior disturbance [F91.9]   . Schizoaffective disorder, bipolar type ([facility]) [F25.0] 04/28/2015  . Schizophrenia ([facility]) [F20.9] 10/10/2014    Total Time spent with patient: 15 minutes  Musculoskeletal: Strength & Muscle Tone: within normal limits Gait & Station: normal Patient leans: N/A  Psychiatric Specialty Exam:   Blood pressure 132/71, pulse 87, temperature 98.7 F (37.1 C), temperature source Oral, resp. rate 18, SpO2 100 %.There is no weight on file to calculate BMI.   General Appearance: Fairly Groomed  Eye Contact:  Good  Speech:  Clear and Coherent and Normal Rate  Volume:  Normal  Mood:  Anxious  Affect:  Congruent  Thought Process:  Coherent and Goal Directed  Orientation:  Full (Time, Place, and Person)  Thought Content:  No psychosis  Suicidal Thoughts:  No  Homicidal Thoughts:  No  Memory:  Immediate;   Good Recent;   Fair Remote;   Fair  Judgement:  Intact  Insight:  Present  Psychomotor Activity:  Normal  Concentration:  Concentration: Fair and Attention Span: Fair  Recall:  [employer] of Knowledge:  Fair  Language:  Good  Akathisia:  No  Handed:  Right  AIMS (if indicated):     Assets:  Communication Skills Desire for Improvement Housing Physical Health Social Support  ADL's:  Intact  Cognition:  WNL  Sleep:      Mental Status Per Nursing Assessment::   On Admission:     Demographic Factors:  Male, Low socioeconomic status and Unemployed  Loss Factors: NA  Historical Factors: Family history of mental illness or substance abuse  Risk Reduction Factors:   Living with another person, especially a relative, Positive social support and  Patient is followed by [employer]  Continued Clinical Symptoms:  Schizophrenia:   Less than 61 years old  Cognitive Features That Contribute To Risk:  None    Suicide Risk:  Minimal: No identifiable suicidal ideation.  Patients presenting with no risk factors but with morbid ruminations; may be classified as minimal risk based on the severity of the depressive symptoms  Follow-up Information    Follow up with follow up with [facility] for medication and therapy. Go in 2 days.   Contact information:   [address] [place] [address] [phone]      Plan Of Care/Follow-up recommendations:  Activity:  As tolerated Diet:  Regular Tests:  As determined by PCP Other:  Keep follow up with [facility]  1.Take all your medications as prescribed.  2. Report any adverse side effects to your medication to your outpatient provider. 3. Do not use alcohol or illegal drugs while taking prescription medications. 4. In the event of worsening symptoms, call 911, the crisis hotline or go to nearest emergency room for evaluation of symptoms.  Alberteen Sam, FNP-BC [facility] 09/11/2015, 12:16 PM

## 2015-09-11 NOTE — ED Notes (Signed)
[employer] on unit, pt within sight.

## 2015-09-11 NOTE — ED Notes (Signed)
Pt g/f at bedside visiting with pt. Visitor voicing concerns in regards to pt discharge, demanding to speak with MD. Dr A notified and at bedside.

## 2015-09-11 NOTE — BH Assessment (Addendum)
09/11/15, 0945 Dr Jannifer Franklin recommends inpt treatment for this pt in 500 hall bed due to SI, HI, and aggressive, agitated behavior. Daleen Squibb, LCSW  09/11/15: 1130.  After further eval, Dr Jannifer Franklin now planning to discharge this pt today with follow up at [facility] Daleen Squibb, LCSW

## 2015-09-11 NOTE — ED Notes (Signed)
Patient is sleeping  

## 2015-09-21 DIAGNOSIS — R45851 Suicidal ideations: Secondary | ICD-10-CM

## 2015-09-28 ENCOUNTER — Encounter (HOSPITAL_COMMUNITY): Payer: Self-pay | Admitting: Emergency Medicine

## 2015-09-28 ENCOUNTER — Emergency Department (HOSPITAL_COMMUNITY)
Admission: EM | Admit: 2015-09-28 | Discharge: 2015-09-28 | Disposition: A | Discharge status: Home / Self Care | Payer: Medicaid Other | Attending: Emergency Medicine | Admitting: Emergency Medicine

## 2015-09-28 DIAGNOSIS — Y92019 Unspecified place in single-family (private) house as the place of occurrence of the external cause: Secondary | ICD-10-CM | POA: Diagnosis not present

## 2015-09-28 DIAGNOSIS — W57XXXA Bitten or stung by nonvenomous insect and other nonvenomous arthropods, initial encounter: Secondary | ICD-10-CM

## 2015-09-28 DIAGNOSIS — Y939 Activity, unspecified: Secondary | ICD-10-CM | POA: Diagnosis not present

## 2015-09-28 DIAGNOSIS — Y999 Unspecified external cause status: Secondary | ICD-10-CM | POA: Insufficient documentation

## 2015-09-28 DIAGNOSIS — S20469A Insect bite (nonvenomous) of unspecified back wall of thorax, initial encounter: Secondary | ICD-10-CM | POA: Diagnosis not present

## 2015-09-28 DIAGNOSIS — S40862A Insect bite (nonvenomous) of left upper arm, initial encounter: Secondary | ICD-10-CM | POA: Insufficient documentation

## 2015-09-28 DIAGNOSIS — F17200 Nicotine dependence, unspecified, uncomplicated: Secondary | ICD-10-CM | POA: Insufficient documentation

## 2015-09-28 DIAGNOSIS — R21 Rash and other nonspecific skin eruption: Secondary | ICD-10-CM | POA: Diagnosis present

## 2015-09-28 DIAGNOSIS — S40861A Insect bite (nonvenomous) of right upper arm, initial encounter: Secondary | ICD-10-CM | POA: Diagnosis not present

## 2015-09-28 DIAGNOSIS — Z79899 Other long term (current) drug therapy: Secondary | ICD-10-CM | POA: Diagnosis not present

## 2015-09-28 MED ORDER — HYDROCORTISONE 1 % EX CREA
TOPICAL_CREAM | Freq: Two times a day (BID) | CUTANEOUS | Status: DC
  Administered 2015-09-28: 19:00:00 via TOPICAL

## 2015-09-28 MED ORDER — HYDROCORTISONE 1 % EX CREA: TOPICAL_CREAM | CUTANEOUS | 0 refills | Status: DC

## 2015-09-28 MED ORDER — DIPHENHYDRAMINE HCL 25 MG PO TABS
25.0000 mg | ORAL_TABLET | Freq: Four times a day (QID) | ORAL | 0 refills | Status: DC
Start: 2015-09-28 — End: 2018-04-23

## 2015-09-28 MED ORDER — DIPHENHYDRAMINE HCL 25 MG PO CAPS: 25.0000 mg | ORAL_CAPSULE | Freq: Once | ORAL | Status: DC

## 2015-09-28 MED ORDER — HYDROCORTISONE 1 % EX CREA
TOPICAL_CREAM | Freq: Two times a day (BID) | CUTANEOUS | Status: DC
  Filled 2015-09-28: qty 28

## 2015-09-28 MED ORDER — DIPHENHYDRAMINE HCL 50 MG/ML IJ SOLN
12.5000 mg | Freq: Once | INTRAMUSCULAR | Status: AC
  Administered 2015-09-28: 12.5 mg via INTRAVENOUS
  Filled 2015-09-28: qty 1

## 2015-09-28 NOTE — Discharge Instructions (Signed)
Medications: Benadryl, hydrocortisone cream  Treatment: Take Benadryl every 6 hours as needed for itching. Apply hydrocortisone twice daily to affected areas. Wash areas with warm soapy water at least twice daily.  Follow-up: Please follow-up with your primary care provider at [facility] if your symptoms are not improving. Please return to emergency department if you develop any new or worsening symptoms.
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
## 2015-09-28 NOTE — ED Triage Notes (Signed)
Pt presents with areas of swelling noted to arms, hand torso and legs. Pt denies pain, pt states he first noticed bites last night but could not find bugs in home or bed. Male sleeps in home and does not have bites. Pt very animated during triage.

## 2015-09-28 NOTE — ED Provider Notes (Signed)
[facility] DEPT Provider Note   CSN: [id number] Arrival date & time: 09/28/15  1749  First Provider Contact:   First MD Initiated Contact with Patient 09/28/15 1847      By signing my name below, I, [name] [name], attest that this documentation has been prepared under the direction and in the presence of [name] ReamAlexandra Kenzli Barritt, PA-C.  Electronically Signed: Placido SouLogan [name], ED Scribe. 09/28/15. 6:57 PM.    History   Chief Complaint Chief Complaint  Patient presents with  . Other    Bed Bugs    HPI HPI Comments: [name] [name] is a 40 y.o. male with a history of homelessness and schizophrenia who presents to the Emergency Department by ambulance complaining of moderate rash to his torso, face, hands and BUE onset last night. Pt states that he has experienced bed bugs in the past, confirms his symptoms are consistent with prior exacerbations and states he received an injection in the past "and went on his merry way". He reports associated itchiness across the affected regions. Per nursing note, a male sleeps with him at home and does not have similar symptoms. He denies fevers, CP, SOB, abd pain and n/v.   The history is provided by the patient. No language interpreter was used.    Past Medical History:  Diagnosis Date  . Aggression   . Alcohol abuse   . Asthma   . Homeless   . Hypertension   . Schizophrenia [facility](HCC)     Patient Active Problem List   Diagnosis Date Noted  . Suicidal ideation 09/21/2015  . Cannabis use disorder, severe, dependence (HCC) 06/18/2015  . Behavior disturbance   . Schizoaffective disorder, bipolar type (HCC) 04/28/2015  . Schizophrenia (HCC) 10/10/2014    Past Surgical History:  Procedure Laterality Date  . KNEE SURGERY    . MOUTH SURGERY         Home Medications    Prior to Admission medications   Medication Sig Start Date End Date Taking? Authorizing Provider  benztropine (COGENTIN) 1 MG tablet Take 1 tablet (1 mg total) by mouth 2  (two) times daily. 09/11/15  Yes Kristeen MansFran E Hobson, NP  haloperidol decanoate (HALDOL DECANOATE) 100 MG/ML injection Inject 150 mg into the muscle every 28 (twenty-eight) days.    Yes Historical Provider, MD  hydrochlorothiazide (MICROZIDE) 12.5 MG capsule Take 1 capsule (12.5 mg total) by mouth daily. 09/11/15  Yes Kristeen MansFran E Hobson, NP  lithium carbonate 600 MG capsule Take 2 capsules (1,200 mg total) by mouth at bedtime. 09/11/15  Yes Kristeen MansFran E Hobson, NP  traZODone (DESYREL) 100 MG tablet Take 1 tablet (100 mg total) by mouth at bedtime. 09/11/15  Yes Kristeen MansFran E Hobson, NP  ziprasidone (GEODON) 60 MG capsule Take 1 capsule (60 mg total) by mouth 2 (two) times daily with a meal. 09/11/15  Yes Kristeen MansFran E Hobson, NP  diphenhydrAMINE (BENADRYL) 25 MG tablet Take 1 tablet (25 mg total) by mouth every 6 (six) hours. 09/28/15   Emi HolesAlexandra M Jakaya Jacobowitz, PA-C  hydrochlorothiazide (HYDRODIURIL) 12.5 MG tablet Take 12.5 mg by mouth daily. Reported on 09/09/2015    Historical Provider, MD  hydrocortisone cream 1 % Apply to affected area 2 times daily 09/28/15   Waylan BogaAlexandra M Emmalin Jaquess, PA-C  PROAIR HFA 108 (90 Base) MCG/ACT inhaler INHALE 2 PUFFS BY MOUTH EVERY 6 HOURS PRN FOR WHEEZING AND SOB 05/21/15   Historical Provider, MD    Family History History reviewed. No pertinent family history.  Social History Social History  Substance Use  Topics  . Smoking status: Current Every Day Smoker  . Smokeless tobacco: Never Used  . Alcohol use Yes     Allergies   Peach seed and Sudafed [pseudoephedrine]   Review of Systems Review of Systems  Constitutional: Negative for chills and fever.  HENT: Negative for facial swelling and sore throat.   Respiratory: Negative for shortness of breath.   Cardiovascular: Negative for chest pain.  Gastrointestinal: Negative for abdominal pain, nausea and vomiting.  Genitourinary: Negative for dysuria.  Musculoskeletal: Negative for back pain.  Skin: Positive for color change and rash. Negative for wound.    Neurological: Negative for headaches.  Psychiatric/Behavioral: The patient is not nervous/anxious.    Physical Exam Updated Vital Signs BP 141/99 (BP Location: Left Arm)   Pulse 69   Temp 98.3 F (36.8 C) (Oral)   Resp 16   SpO2 97%   Physical Exam  Constitutional: He appears well-developed and well-nourished. No distress.  HENT:  Head: Normocephalic and atraumatic.  Eyes: Conjunctivae are normal. Pupils are equal, round, and reactive to light. Right eye exhibits no discharge. Left eye exhibits no discharge. No scleral icterus.  Neck: Normal range of motion. Neck supple. No thyromegaly present.  Cardiovascular: Normal rate, regular rhythm and normal heart sounds.  Exam reveals no gallop and no friction rub.   No murmur heard. Pulmonary/Chest: Effort normal and breath sounds normal. No stridor. No respiratory distress. He has no wheezes. He has no rales.  Abdominal: Soft. Bowel sounds are normal. He exhibits no distension. There is no tenderness. There is no rebound and no guarding.  Musculoskeletal: He exhibits no edema.  Lymphadenopathy:    He has no cervical adenopathy.  Neurological: He is alert. Coordination normal.  Skin: Skin is warm and dry. Rash noted. He is not diaphoretic. There is erythema. No pallor.  Diffuse areas of raised erythema; non tender, nonfluctuant; resembling insect bites; RUE, dorsum of hands bilaterally and back; spares finger web spaces  Psychiatric: He has a normal mood and affect.  Nursing note and vitals reviewed.  ED Treatments / Results  Labs (all labs ordered are listed, but only abnormal results are displayed) Labs Reviewed - No data to display  EKG  EKG Interpretation None       Radiology No results found.  Procedures Procedures  DIAGNOSTIC STUDIES: Oxygen Saturation is 97% on RA, normal by my interpretation.    COORDINATION OF CARE: 6:53 PM Discussed next steps with pt. Pt verbalized understanding and is agreeable with the  plan.    Medications Ordered in ED Medications  hydrocortisone cream 1 % ( Topical Given 09/28/15 1908)  diphenhydrAMINE (BENADRYL) injection 12.5 mg (12.5 mg Intravenous Given 09/28/15 1910)     Initial Impression / Assessment and Plan / ED Course  I have reviewed the triage vital signs and the nursing notes.  Pertinent labs & imaging results that were available during my care of the patient were reviewed by me and considered in my medical decision making (see chart for details).  Clinical Course   Patient with nonspecific eruption resembling insect bites. No signs of infection. Patient advised to wash his sheets and close with hot water to treat bedbug infestation. Discharge with symptomatic treatment, Including Benadryl and hydrocortisone cream. Benadryl injection IM given in ED per patient request. Follow up with PCP if symptoms are not improving. Patient understands and agrees with plan. Patient vitals stable throughout ED course and discharged in satisfactory condition.  I personally performed the services described in  this documentation, which was scribed in my presence. The recorded information has been reviewed and is accurate.   Final Clinical Impressions(s) / ED Diagnoses   Final diagnoses:  Insect bites    New Prescriptions Discharge Medication List as of 09/28/2015  7:09 PM    START taking these medications   Details  diphenhydrAMINE (BENADRYL) 25 MG tablet Take 1 tablet (25 mg total) by mouth every 6 (six) hours., Starting Tue 09/28/2015, Print    hydrocortisone cream 1 % Apply to affected area 2 times daily, Print         Emi Holes, PA-C 09/28/15 1924    Shaune Pollack, MD 09/29/15 1224

## 2015-09-30 ENCOUNTER — Encounter (HOSPITAL_COMMUNITY): Payer: Self-pay | Admitting: Emergency Medicine

## 2015-09-30 ENCOUNTER — Emergency Department (HOSPITAL_COMMUNITY)
Admission: EM | Admit: 2015-09-30 | Discharge: 2015-09-30 | Disposition: A | Discharge status: Home / Self Care | Payer: Medicaid Other | Attending: Emergency Medicine | Admitting: Emergency Medicine

## 2015-09-30 DIAGNOSIS — F209 Schizophrenia, unspecified: Secondary | ICD-10-CM | POA: Diagnosis not present

## 2015-09-30 DIAGNOSIS — I10 Essential (primary) hypertension: Secondary | ICD-10-CM | POA: Diagnosis not present

## 2015-09-30 DIAGNOSIS — Z79899 Other long term (current) drug therapy: Secondary | ICD-10-CM | POA: Insufficient documentation

## 2015-09-30 DIAGNOSIS — J45909 Unspecified asthma, uncomplicated: Secondary | ICD-10-CM | POA: Diagnosis not present

## 2015-09-30 DIAGNOSIS — F17200 Nicotine dependence, unspecified, uncomplicated: Secondary | ICD-10-CM | POA: Diagnosis not present

## 2015-09-30 DIAGNOSIS — Z7951 Long term (current) use of inhaled steroids: Secondary | ICD-10-CM | POA: Diagnosis not present

## 2015-09-30 DIAGNOSIS — Z0000 Encounter for general adult medical examination without abnormal findings: Secondary | ICD-10-CM | POA: Diagnosis present

## 2015-09-30 MED ORDER — HALOPERIDOL DECANOATE 100 MG/ML IM SOLN
150.0000 mg | Freq: Once | INTRAMUSCULAR | Status: AC
  Administered 2015-09-30: 150 mg via INTRAMUSCULAR
  Filled 2015-09-30: qty 1.5

## 2015-09-30 NOTE — ED Provider Notes (Signed)
[facility] DE[facility] Provider Note   CSN: [id number] Arrival date & time: 09/30/15  1230  First Provider Contact:  First MD Initiated Contact with Patient 09/30/15 1247        History   Chief Complaint Chief Complaint  Patient presents with  . Medical Clearance    HPI [name] [name] is a 40 y.o. male.  The history is provided by the patient. No language interpreter was used.    [name] [name] is a 40 y.o. male who presents to the Emergency Department complaining of requesting haldol injection.  He has a history of schizophrenia and receives Haldol depo injections every 4 weeks. He reports being under increased stress over the last week since his girlfriend left him and took out a restraining order on him. He denies any SI, HI, or hallucinations. He is requesting his Haldol injection early because he is afraid he might "lose it". He went to [facility] to receive the injection today but was told that his dose isn't due until August 18. His last dose was on July 20. He was certain that the dose was due to today.  Past Medical History:  Diagnosis Date  . Aggression   . Alcohol abuse   . Asthma   . Homeless   . Hypertension   . Schizophrenia [facility])     Patient Active Problem List   Diagnosis Date Noted  . Suicidal ideation 09/21/2015  . Cannabis use disorder, severe, dependence (HCC) 06/18/2015  . Behavior disturbance   . Schizoaffective disorder, bipolar type (HCC) 04/28/2015  . Schizophrenia (HCC) 10/10/2014    Past Surgical History:  Procedure Laterality Date  . KNEE SURGERY    . MOUTH SURGERY         Home Medications    Prior to Admission medications   Medication Sig Start Date End Date Taking? Authorizing Provider  benztropine (COGENTIN) 1 MG tablet Take 1 tablet (1 mg total) by mouth 2 (two) times daily. 09/11/15   Kristeen Mans, NP  diphenhydrAMINE (BENADRYL) 25 MG tablet Take 1 tablet (25 mg total) by mouth every 6 (six) hours. 09/28/15   Emi Holes,  PA-C  haloperidol decanoate (HALDOL DECANOATE) 100 MG/ML injection Inject 150 mg into the muscle every 28 (twenty-eight) days.     Historical Provider, MD  hydrochlorothiazide (HYDRODIURIL) 12.5 MG tablet Take 12.5 mg by mouth daily. Reported on 09/09/2015    Historical Provider, MD  hydrochlorothiazide (MICROZIDE) 12.5 MG capsule Take 1 capsule (12.5 mg total) by mouth daily. 09/11/15   Kristeen Mans, NP  hydrocortisone cream 1 % Apply to affected area 2 times daily 09/28/15   Emi Holes, PA-C  lithium carbonate 600 MG capsule Take 2 capsules (1,200 mg total) by mouth at bedtime. 09/11/15   Kristeen Mans, NP  PROAIR HFA 108 [phone] Base) MCG/ACT inhaler INHALE 2 PUFFS BY MOUTH EVERY 6 HOURS PRN FOR WHEEZING AND SOB 05/21/15   Historical Provider, MD  traZODone (DESYREL) 100 MG tablet Take 1 tablet (100 mg total) by mouth at bedtime. 09/11/15   Kristeen Mans, NP  ziprasidone (GEODON) 60 MG capsule Take 1 capsule (60 mg total) by mouth 2 (two) times daily with a meal. 09/11/15   Kristeen Mans, NP    Family History No family history on file.  Social History Social History  Substance Use Topics  . Smoking status: Current Every Day Smoker  . Smokeless tobacco: Never Used  . Alcohol use Yes     Allergies  Peach seed and Sudafed [pseudoephedrine]   Review of Systems Review of Systems  All other systems reviewed and are negative.    Physical Exam Updated Vital Signs BP (!) 147/108   Pulse 75   Temp 98.3 F (36.8 C) (Oral)   Resp 18   SpO2 99%   Physical Exam  Constitutional: He is oriented to person, place, and time. He appears well-developed and well-nourished.  HENT:  Head: Normocephalic and atraumatic.  Cardiovascular: Normal rate and regular rhythm.   No murmur heard. Pulmonary/Chest: Effort normal and breath sounds normal. No respiratory distress.  Abdominal: Soft. There is no tenderness. There is no rebound and no guarding.  Musculoskeletal: He exhibits no edema or  tenderness.  Neurological: He is alert and oriented to person, place, and time.  Skin: Skin is warm and dry.  Psychiatric:  Anxious appearing  Nursing note and vitals reviewed.    ED Treatments / Results  Labs (all labs ordered are listed, but only abnormal results are displayed) Labs Reviewed - No data to display  EKG  EKG Interpretation None       Radiology No results found.  Procedures Procedures (including critical care time)  Medications Ordered in ED Medications - No data to display   Initial Impression / Assessment and Plan / ED Course  I have reviewed the triage vital signs and the nursing notes.  Pertinent labs & imaging results that were available during my care of the patient were reviewed by me and considered in my medical decision making (see chart for details).  Clinical Course    Patient with history of schizophrenia requesting his haldol depo injection early due to feeling stress. He is not suicidal, homicidal or acutely psychotic. Discussed the case with the pharmacist and they state that it should be safe to provide the dose early. Contacted [facility] to let them know that he will need a follow-up appointment sooner and that his dosing regimen will need adjusted. Discussed with patient home care, close outpatient follow-up, return precautions.  Final Clinical Impressions(s) / ED Diagnoses   Final diagnoses:  Schizophrenia, unspecified type [facility](HCC)    New Prescriptions New Prescriptions   No medications on file     Tilden FossaElizabeth Daeton Kluth, MD 09/30/15 1628

## 2015-09-30 NOTE — ED Triage Notes (Addendum)
Patient brought in via [employer], requesting shots because he is "on the verge of losing it."

## 2015-09-30 NOTE — ED Notes (Signed)
Patient given meal tray. Discharge instructions reviewed with patient, patient verbalizes understanding of same. Escorted by [employer] upon d/c in NAD.

## 2015-10-03 ENCOUNTER — Emergency Department (HOSPITAL_COMMUNITY)
Admission: EM | Admit: 2015-10-03 | Discharge: 2015-10-04 | Disposition: A | Discharge status: Home / Self Care | Payer: Medicaid Other | Attending: Emergency Medicine | Admitting: Emergency Medicine

## 2015-10-03 ENCOUNTER — Encounter (HOSPITAL_COMMUNITY): Payer: Self-pay | Admitting: Emergency Medicine

## 2015-10-03 DIAGNOSIS — F17200 Nicotine dependence, unspecified, uncomplicated: Secondary | ICD-10-CM | POA: Insufficient documentation

## 2015-10-03 DIAGNOSIS — Z046 Encounter for general psychiatric examination, requested by authority: Secondary | ICD-10-CM | POA: Diagnosis present

## 2015-10-03 DIAGNOSIS — F209 Schizophrenia, unspecified: Secondary | ICD-10-CM | POA: Diagnosis not present

## 2015-10-03 DIAGNOSIS — J45909 Unspecified asthma, uncomplicated: Secondary | ICD-10-CM | POA: Diagnosis not present

## 2015-10-03 DIAGNOSIS — F1490 Cocaine use, unspecified, uncomplicated: Secondary | ICD-10-CM | POA: Insufficient documentation

## 2015-10-03 DIAGNOSIS — F250 Schizoaffective disorder, bipolar type: Secondary | ICD-10-CM | POA: Diagnosis not present

## 2015-10-03 DIAGNOSIS — F1290 Cannabis use, unspecified, uncomplicated: Secondary | ICD-10-CM | POA: Diagnosis not present

## 2015-10-03 DIAGNOSIS — Z79899 Other long term (current) drug therapy: Secondary | ICD-10-CM | POA: Diagnosis not present

## 2015-10-03 DIAGNOSIS — I10 Essential (primary) hypertension: Secondary | ICD-10-CM | POA: Diagnosis not present

## 2015-10-03 DIAGNOSIS — F1410 Cocaine abuse, uncomplicated: Secondary | ICD-10-CM | POA: Diagnosis present

## 2015-10-03 DIAGNOSIS — R45851 Suicidal ideations: Secondary | ICD-10-CM | POA: Insufficient documentation

## 2015-10-03 LAB — COMPREHENSIVE METABOLIC PANEL
ALT: 18 U/L (ref 17–63)
AST: 23 U/L (ref 15–41)
Albumin: 4.1 g/dL (ref 3.5–5.0)
Alkaline Phosphatase: 77 U/L (ref 38–126)
Anion gap: 10 (ref 5–15)
BUN: 7 mg/dL (ref 6–20)
CO2: 23 mmol/L (ref 22–32)
Calcium: 9.5 mg/dL (ref 8.9–10.3)
Chloride: 109 mmol/L (ref 101–111)
Creatinine, Ser: 1.12 mg/dL (ref 0.61–1.24)
GFR calc Af Amer: >60 mL/min (ref >60)
GFR calc non Af Amer: >60 mL/min (ref >60)
Glucose, Bld: 85 mg/dL (ref 65–99)
Potassium: 4.1 mmol/L (ref 3.5–5.1)
Sodium: 142 mmol/L (ref 135–145)
Total Bilirubin: 0.8 mg/dL (ref 0.3–1.2)
Total Protein: 7.1 g/dL (ref 6.5–8.1)

## 2015-10-03 LAB — RAPID URINE DRUG SCREEN, HOSP PERFORMED
Amphetamines: NOT DETECTED
Barbiturates: NOT DETECTED
Benzodiazepines: NOT DETECTED
Cocaine: POSITIVE — AB
Opiates: NOT DETECTED
Tetrahydrocannabinol: POSITIVE — AB

## 2015-10-03 LAB — CBC WITH DIFFERENTIAL/PLATELET
Basophils Absolute: 0 10*3/uL (ref 0.0–0.1)
Basophils Relative: 0 %
Eosinophils Absolute: 0.1 10*3/uL (ref 0.0–0.7)
Eosinophils Relative: 2 %
HCT: 44 % (ref 39.0–52.0)
Hemoglobin: 14.8 g/dL (ref 13.0–17.0)
Lymphocytes Relative: 17 %
Lymphs Abs: 1.1 10*3/uL (ref 0.7–4.0)
MCH: 31.5 pg (ref 26.0–34.0)
MCHC: 33.6 g/dL (ref 30.0–36.0)
MCV: 93.6 fL (ref 78.0–100.0)
Monocytes Absolute: 0.8 10*3/uL (ref 0.1–1.0)
Monocytes Relative: 11 %
Neutro Abs: 4.7 10*3/uL (ref 1.7–7.7)
Neutrophils Relative %: 70 %
Platelets: 216 10*3/uL (ref 150–400)
RBC: 4.7 MIL/uL (ref 4.22–5.81)
RDW: 13.1 % (ref 11.5–15.5)
WBC: 6.7 10*3/uL (ref 4.0–10.5)

## 2015-10-03 LAB — ETHANOL: Alcohol, Ethyl (B): <5 mg/dL (ref <5)

## 2015-10-03 LAB — ACETAMINOPHEN LEVEL: Acetaminophen (Tylenol), Serum: <10 ug/mL — ABNORMAL LOW (ref 10–30)

## 2015-10-03 LAB — SALICYLATE LEVEL: Salicylate Lvl: <4 mg/dL (ref 2.8–30.0)

## 2015-10-03 LAB — LITHIUM LEVEL: Lithium Lvl: <0.06 mmol/L — ABNORMAL LOW (ref 0.60–1.20)

## 2015-10-03 MED ORDER — HYDROCHLOROTHIAZIDE 12.5 MG PO CAPS
12.5000 mg | ORAL_CAPSULE | Freq: Every day | ORAL | Status: DC
  Filled 2015-10-03: qty 1

## 2015-10-03 MED ORDER — LORAZEPAM 2 MG/ML IJ SOLN: 2.0000 mg | Freq: Once | INTRAMUSCULAR | Status: DC | PRN

## 2015-10-03 MED ORDER — TRAZODONE HCL 100 MG PO TABS
100.0000 mg | ORAL_TABLET | Freq: Every evening | ORAL | Status: DC | PRN
  Administered 2015-10-03: 100 mg via ORAL
  Filled 2015-10-03: qty 1

## 2015-10-03 MED ORDER — HALOPERIDOL 5 MG PO TABS
5.0000 mg | ORAL_TABLET | Freq: Two times a day (BID) | ORAL | Status: DC
  Administered 2015-10-03 – 2015-10-04 (×3): 5 mg via ORAL
  Filled 2015-10-03 (×3): qty 1

## 2015-10-03 MED ORDER — BENZTROPINE MESYLATE 1 MG PO TABS
1.0000 mg | ORAL_TABLET | Freq: Two times a day (BID) | ORAL | Status: DC
  Administered 2015-10-03 – 2015-10-04 (×3): 1 mg via ORAL
  Filled 2015-10-03 (×4): qty 1

## 2015-10-03 MED ORDER — DIPHENHYDRAMINE HCL 50 MG/ML IJ SOLN: 50.0000 mg | Freq: Once | INTRAMUSCULAR | Status: DC | PRN

## 2015-10-03 MED ORDER — ZOLPIDEM TARTRATE 5 MG PO TABS: 5.0000 mg | ORAL_TABLET | Freq: Every day | ORAL | Status: DC

## 2015-10-03 MED ORDER — HALOPERIDOL LACTATE 5 MG/ML IJ SOLN: 5.0000 mg | Freq: Once | INTRAMUSCULAR | Status: DC | PRN

## 2015-10-03 NOTE — ED Notes (Addendum)
Patient noted in room, visitor at bedside. No complaints, stable, in no acute distress. Q15 minute rounds and monitoring via [employer] to continue.

## 2015-10-03 NOTE — ED Triage Notes (Signed)
Pt signed a consent for his girlfriend to receive medical information about him. Pt girlfriend stated that "I want to talk to the doctor before he is discharged and standing on my porch". This Clinical research associatewriter informed girlfriend and pt that I would make a note so the doctor and TTS to see.

## 2015-10-03 NOTE — ED Notes (Signed)
Patient noted sleeping in room. No complaints, stable, in no acute distress. Q15 minute rounds and monitoring via Security Cam[employer] to continue.  

## 2015-10-03 NOTE — ED Provider Notes (Addendum)
[facility] DEPT Provider Note   CSN: [id number] Arrival date & time: 10/03/15  1224  First Provider Contact:  First MD Initiated Contact with Patient 10/03/15 1240        History   Chief Complaint Chief Complaint  Patient presents with  . Psychiatric Evaluation    HPI [name] [name] is a 40 y.o. male.  The history is provided by the patient.  Patient presents requesting psychologic workup. States that he's been off his medications for a while. States he's been feeling bad. Denies hallucinations. Somewhat difficult to get history from. States he's been doing many different drugs including Molly. States besides the shot of Haldol he had in the ER 3 days ago he has not been taking his medications. He presents under police escort since he has attempted to harm ED staff in the past. He is upset with one of the police officers because he has been tasered by him in the past. Has some suicidal thoughts which is somewhat chronic for him.. States he gets seen at [facility] and his next appointment is 3 or 4 days from now.  Past Medical History:  Diagnosis Date  . Aggression   . Alcohol abuse   . Asthma   . Homeless   . Hypertension   . Schizophrenia [facility])     Patient Active Problem List   Diagnosis Date Noted  . Suicidal ideation 09/21/2015  . Cannabis use disorder, severe, dependence (HCC) 06/18/2015  . Behavior disturbance   . Schizoaffective disorder, bipolar type (HCC) 04/28/2015  . Schizophrenia (HCC) 10/10/2014    Past Surgical History:  Procedure Laterality Date  . KNEE SURGERY    . MOUTH SURGERY         Home Medications    Prior to Admission medications   Medication Sig Start Date End Date Taking? Authorizing Provider  albuterol (PROVENTIL HFA;VENTOLIN HFA) 108 (90 Base) MCG/ACT inhaler Inhale 2 puffs into the lungs every 6 (six) hours as needed for wheezing or shortness of breath.   Yes Historical Provider, MD  haloperidol decanoate (HALDOL DECANOATE) 100  MG/ML injection Inject 150 mg into the muscle every 28 (twenty-eight) days.    Yes Historical Provider, MD  benztropine (COGENTIN) 1 MG tablet Take 1 tablet (1 mg total) by mouth 2 (two) times daily. Patient not taking: Reported on 10/03/2015 09/11/15   Kristeen Mans, NP  diphenhydrAMINE (BENADRYL) 25 MG tablet Take 1 tablet (25 mg total) by mouth every 6 (six) hours. Patient not taking: Reported on 09/30/2015 09/28/15   Emi Holes, PA-C  hydrochlorothiazide (MICROZIDE) 12.5 MG capsule Take 1 capsule (12.5 mg total) by mouth daily. Patient not taking: Reported on 09/30/2015 09/11/15   Kristeen Mans, NP  hydrocortisone cream 1 % Apply to affected area 2 times daily Patient not taking: Reported on 09/30/2015 09/28/15   Emi Holes, PA-C  lithium carbonate 600 MG capsule Take 2 capsules (1,200 mg total) by mouth at bedtime. Patient not taking: Reported on 10/03/2015 09/11/15   Kristeen Mans, NP  traZODone (DESYREL) 100 MG tablet Take 1 tablet (100 mg total) by mouth at bedtime. Patient not taking: Reported on 10/03/2015 09/11/15   Kristeen Mans, NP  ziprasidone (GEODON) 60 MG capsule Take 1 capsule (60 mg total) by mouth 2 (two) times daily with a meal. Patient not taking: Reported on 10/03/2015 09/11/15   Kristeen Mans, NP    Family History History reviewed. No pertinent family history.  Social History Social History  Substance  Use Topics  . Smoking status: Current Every Day Smoker  . Smokeless tobacco: Never Used  . Alcohol use Yes     Allergies   Sudafed [pseudoephedrine] and Food   Review of Systems Review of Systems  Unable to perform ROS: Psychiatric disorder     Physical Exam Updated Vital Signs BP 136/88 (BP Location: Left Arm)   Pulse (!) 58   Temp 97.7 F (36.5 C) (Oral)   Resp 20   SpO2 97%   Physical Exam  Constitutional: He appears well-developed.  HENT:  Head: Atraumatic.  Eyes: EOM are normal.  Cardiovascular: Normal rate.   Pulmonary/Chest: Effort normal.    Abdominal: Soft.  Musculoskeletal: Normal range of motion.  Neurological: He is alert.  Skin: Skin is warm.  Psychiatric:  Patient is somewhat pressured.     ED Treatments / Results  Labs (all labs ordered are listed, but only abnormal results are displayed) Labs Reviewed  URINE RAPID DRUG SCREEN, HOSP PERFORMED - Abnormal; Notable for the following:       Result Value   Cocaine POSITIVE (*)    Tetrahydrocannabinol POSITIVE (*)    All other components within normal limits  ACETAMINOPHEN LEVEL - Abnormal; Notable for the following:    Acetaminophen (Tylenol), Serum <10 (*)    All other components within normal limits  LITHIUM LEVEL - Abnormal; Notable for the following:    Lithium Lvl <0.06 (*)    All other components within normal limits  COMPREHENSIVE METABOLIC PANEL  ETHANOL  CBC WITH DIFFERENTIAL/PLATELET  SALICYLATE LEVEL    EKG  EKG Interpretation None       Radiology No results found.  Procedures Procedures (including critical care time)  Medications Ordered in ED Medications  benztropine (COGENTIN) tablet 1 mg (1 mg Oral Given 10/03/15 1359)  hydrochlorothiazide (MICROZIDE) capsule 12.5 mg (12.5 mg Oral Not Given 10/03/15 1401)  haloperidol (HALDOL) tablet 5 mg (5 mg Oral Given 10/03/15 1359)  haloperidol lactate (HALDOL) injection 5 mg (not administered)  diphenhydrAMINE (BENADRYL) injection 50 mg (not administered)  LORazepam (ATIVAN) injection 2 mg (not administered)  traZODone (DESYREL) tablet 100 mg (not administered)     Initial Impression / Assessment and Plan / ED Course  I have reviewed the triage vital signs and the nursing notes.  Pertinent labs & imaging results that were available during my care of the patient were reviewed by me and considered in my medical decision making (see chart for details).  Clinical Course  Value Comment By Time  Lithium: (!) <0.06 (Reviewed) Benjiman Core, MD 08/13 2043    Patient was schizophrenia and  substance abuse. Initially told me he had only mild suicidal thoughts. Now has told the TTS team that he is suicidal with plan of stepping in front of a plane or going into a bathtub and put his pills in the water so they can absorb through his skin. Psychiatry knows patient well. This date with his statements now he will likely require inpatient treatment or at least evaluation in the morning.  Final Clinical Impressions(s) / ED Diagnoses   Final diagnoses:  Schizophrenia, unspecified type (HCC)  Suicidal ideation    New Prescriptions New Prescriptions   No medications on file     Benjiman Core, MD 10/03/15 1345  Patient is voluntary at this time. Has had suicidal thoughts but states he wants help and while he is seeking treatment and don't think he needs IVC. If he attempts to leave he may need IVC.  Benjiman CoreNathan Ingrid Shifrin, MD 10/03/15 1453  Patient will be IVC. He has made threatening statements to the police officer. Has a history of violence in the ER.     Benjiman CoreNathan Giankarlo Leamer, MD 10/03/15 1549    Benjiman CoreNathan Rosey Eide, MD 10/03/15 2052

## 2015-10-03 NOTE — ED Notes (Signed)
[facility] Expected date:  Expected time:  Means of arrival:  Comments: Hold for Triage 3

## 2015-10-03 NOTE — ED Notes (Signed)
Visitor at bedside.

## 2015-10-03 NOTE — ED Notes (Signed)
On the phone 

## 2015-10-03 NOTE — ED Notes (Signed)
Patient noted in room. No complaints, stable, in no acute distress. Q15 minute rounds and monitoring via Security Cameras to continue.  

## 2015-10-03 NOTE — ED Notes (Signed)
Report to include Situation, Background, Assessment, and Recommendations received from Janie Rambo RN. Patient alert and oriented, warm and dry, in no acute distress. Patient denies SI, HI, AVH and pain. Patient made aware of Q15 minute rounds and security cameras for their safety. Patient instructed to come to me with needs or concerns.  

## 2015-10-03 NOTE — ED Notes (Signed)
Up tot he bathroom to shower and change scrubs 

## 2015-10-03 NOTE — ED Triage Notes (Addendum)
Pt got into argument with significant other earlier today. Pt reports he was told by significant other that he needed to get his psychiatric condition under control before he could go back home. Pt denies any new SI. Was given IM haldol in ED a few days ago. Has not been taking other medications. Pt requesting IM injections. Pt has multiple [employer] officers and security officers at bedside due to hx of violent behavior against ED staff. Pt has gotten verbally aggressive towards off duty officer during ED visit.

## 2015-10-03 NOTE — ED Notes (Addendum)
Pt. Upset with visitor. States "you just want to commit me". Visitor asked Tech "please don't send him home".
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
## 2015-10-04 DIAGNOSIS — F1410 Cocaine abuse, uncomplicated: Secondary | ICD-10-CM

## 2015-10-04 DIAGNOSIS — F250 Schizoaffective disorder, bipolar type: Secondary | ICD-10-CM | POA: Diagnosis not present

## 2015-10-04 NOTE — ED Notes (Signed)
I spoke with Patient and his wife at length.  Pt appears calm and cooperative and does not appear psychotic.  Pt states that "I quit taking my medication to make her mad"  He also states "cocaine helps me to calm down when I'm off my medication"   Pt did not wish to talk about Narcotics Anonymous.  His wife wants him to have long term drug treatment.
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
## 2015-10-04 NOTE — ED Notes (Signed)
Patient noted sleeping in room. No complaints, stable, in no acute distress. Q15 minute rounds and monitoring via Security Cam[employer] to continue.  
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
## 2015-10-04 NOTE — ED Notes (Signed)
Pt discharged ambulatory with wife.  Police escorted pt. Off the premises.  All belongings were returned and resources were given

## 2015-10-04 NOTE — BHH Suicide Risk Assessment (Signed)
Suicide Risk Assessment  Discharge Assessment   [facility]   Principal Problem: Schizoaffective disorder, bipolar type [facility](HCC) Discharge Diagnoses:  Patient Active Problem List   Diagnosis Date Noted  . Cocaine abuse [F14.10] 10/04/2015    Priority: High  . Schizoaffective disorder, bipolar type (HCC) [F25.0] 04/28/2015    Priority: High  . Suicidal ideation [R45.851] 09/21/2015  . Cannabis use disorder, severe, dependence (HCC) [F12.20] 06/18/2015  . Behavior disturbance [F91.9]   . Schizophrenia (HCC) [F20.9] 10/10/2014    Total Time spent with patient: 45 minutes  Musculoskeletal: Strength & Muscle Tone: within normal limits Gait & Station: normal Patient leans: N/A  Psychiatric Specialty Exam: Physical Exam  Constitutional: He is oriented to person, place, and time. He appears well-developed and well-nourished.  HENT:  Head: Normocephalic.  Neck: Normal range of motion.  Respiratory: Effort normal.  Musculoskeletal: Normal range of motion.  Neurological: He is alert and oriented to person, place, and time.  Skin: Skin is warm and dry.  Psychiatric: He has a normal mood and affect. His speech is normal and behavior is normal. Judgment and thought content normal. Cognition and memory are normal.    Review of Systems  Constitutional: Negative.   HENT: Negative.   Eyes: Negative.   Respiratory: Negative.   Cardiovascular: Negative.   Gastrointestinal: Negative.   Genitourinary: Negative.   Musculoskeletal: Negative.   Skin: Negative.   Neurological: Negative.   Endo/Heme/Allergies: Negative.   Psychiatric/Behavioral: Positive for substance abuse.    Blood pressure 118/67, pulse (!) 57, temperature 98.3 F (36.8 C), temperature source Oral, resp. rate 17, SpO2 100 %.There is no height or weight on file to calculate BMI.  General Appearance: Casual  Eye Contact:  Good  Speech:  Normal Rate  Volume:  Normal  Mood:  Euthymic  Affect:   Congruent  Thought Process:  Coherent and Descriptions of Associations: Intact  Orientation:  Full (Time, Place, and Person)  Thought Content:  WDL  Suicidal Thoughts:  No  Homicidal Thoughts:  No  Memory:  Immediate;   Good Recent;   Good Remote;   Good  Judgement:  Fair  Insight:  Fair  Psychomotor Activity:  Normal  Concentration:  Concentration: Good and Attention Span: Good  Recall:  Good  Fund of Knowledge:  Good  Language:  Good  Akathisia:  No  Handed:  Right  AIMS (if indicated):     Assets:  Leisure Time Physical Health Resilience  ADL's:  Intact  Cognition:  WNL  Sleep:       Mental Status Per Nursing Assessment::   On Admission:   suicidal ideations  Demographic Factors:  NA  Loss Factors: NA  Historical Factors: NA  Risk Reduction Factors:   Responsible for children under 39 years of age, Sense of responsibility to family, Living with another person, especially a relative, Positive social support and Positive therapeutic relationship  Continued Clinical Symptoms:  Irritable at times  Cognitive Features That Contribute To Risk:  None    Suicide Risk:  Minimal: No identifiable suicidal ideation.  Patients presenting with no risk factors but with morbid ruminations; may be classified as minimal risk based on the severity of the depressive symptoms    Plan Of Care/Follow-up recommendations:  Activity:  as tolerated Diet:  heart healthy diet  LORD, JAMISON, NP 10/04/2015, 10:44 AM

## 2015-10-04 NOTE — Discharge Instructions (Signed)
For your ongoing mental health needs, you are advised to continue treatment with [facility].  If you do not currently have an appointment, new and returning patients are seen at their walk-in clinic.  Walk-in hours are Monday - Friday from 8:00 am - 3:00 pm.  Walk-in patients are seen on a first come, first served basis.  Try to arrive as early as possible for he best chance of being seen the same day: ° °     [facility] °     [address] °     [place], [place] [address] °     [phone] °

## 2016-02-14 ENCOUNTER — Encounter (HOSPITAL_COMMUNITY): Payer: Self-pay | Admitting: Oncology

## 2016-02-14 ENCOUNTER — Emergency Department (HOSPITAL_COMMUNITY)
Admission: EM | Admit: 2016-02-14 | Discharge: 2016-02-14 | Disposition: A | Discharge status: Home / Self Care | Payer: Medicaid Other | Attending: Emergency Medicine | Admitting: Emergency Medicine

## 2016-02-14 DIAGNOSIS — I10 Essential (primary) hypertension: Secondary | ICD-10-CM | POA: Diagnosis not present

## 2016-02-14 DIAGNOSIS — Z79899 Other long term (current) drug therapy: Secondary | ICD-10-CM | POA: Insufficient documentation

## 2016-02-14 DIAGNOSIS — F17200 Nicotine dependence, unspecified, uncomplicated: Secondary | ICD-10-CM | POA: Diagnosis not present

## 2016-02-14 DIAGNOSIS — J45909 Unspecified asthma, uncomplicated: Secondary | ICD-10-CM | POA: Diagnosis not present

## 2016-02-14 DIAGNOSIS — T433X2A Poisoning by phenothiazine antipsychotics and neuroleptics, intentional self-harm, initial encounter: Secondary | ICD-10-CM

## 2016-02-14 DIAGNOSIS — R45851 Suicidal ideations: Secondary | ICD-10-CM

## 2016-02-14 LAB — RAPID URINE DRUG SCREEN, HOSP PERFORMED
Amphetamines: NOT DETECTED
Barbiturates: NOT DETECTED
Benzodiazepines: NOT DETECTED
Cocaine: POSITIVE — AB
Opiates: NOT DETECTED
Tetrahydrocannabinol: POSITIVE — AB

## 2016-02-14 LAB — COMPREHENSIVE METABOLIC PANEL
ALT: 17 U/L (ref 17–63)
AST: 16 U/L (ref 15–41)
Albumin: 4.2 g/dL (ref 3.5–5.0)
Alkaline Phosphatase: 68 U/L (ref 38–126)
Anion gap: 7 (ref 5–15)
BUN: 11 mg/dL (ref 6–20)
CO2: 25 mmol/L (ref 22–32)
Calcium: 9.8 mg/dL (ref 8.9–10.3)
Chloride: 112 mmol/L — ABNORMAL HIGH (ref 101–111)
Creatinine, Ser: 0.91 mg/dL (ref 0.61–1.24)
GFR calc Af Amer: >60 mL/min (ref >60)
GFR calc non Af Amer: >60 mL/min (ref >60)
Glucose, Bld: 101 mg/dL — ABNORMAL HIGH (ref 65–99)
Potassium: 3.6 mmol/L (ref 3.5–5.1)
Sodium: 144 mmol/L (ref 135–145)
Total Bilirubin: 0.7 mg/dL (ref 0.3–1.2)
Total Protein: 6.5 g/dL (ref 6.5–8.1)

## 2016-02-14 LAB — ETHANOL: Alcohol, Ethyl (B): <5 mg/dL (ref <5)

## 2016-02-14 LAB — SALICYLATE LEVEL: Salicylate Lvl: <7 mg/dL (ref 2.8–30.0)

## 2016-02-14 LAB — CBC
HCT: 41.8 % (ref 39.0–52.0)
Hemoglobin: 14.4 g/dL (ref 13.0–17.0)
MCH: 31.6 pg (ref 26.0–34.0)
MCHC: 34.4 g/dL (ref 30.0–36.0)
MCV: 91.9 fL (ref 78.0–100.0)
Platelets: 245 10*3/uL (ref 150–400)
RBC: 4.55 MIL/uL (ref 4.22–5.81)
RDW: 12.4 % (ref 11.5–15.5)
WBC: 6.3 10*3/uL (ref 4.0–10.5)

## 2016-02-14 LAB — CBG MONITORING, ED: Glucose-Capillary: 90 mg/dL (ref 65–99)

## 2016-02-14 LAB — ACETAMINOPHEN LEVEL: Acetaminophen (Tylenol), Serum: <10 ug/mL — ABNORMAL LOW (ref 10–30)

## 2016-02-14 LAB — LITHIUM LEVEL: Lithium Lvl: 0.27 mmol/L — ABNORMAL LOW (ref 0.60–1.20)

## 2016-02-14 NOTE — ED Triage Notes (Signed)
Pt bib [facility] accompanied by [facility].  Pt ingested 20, 100 mg tablets of thorazine at 0024 in a suicide attempt.  Pt keeps saying, "I don't want to live no more."  Pt states he is unable to see his children and doesn't want to live any longer.  [employer] to stay at bedside. Pt is calm and cooperative at this time.

## 2016-02-14 NOTE — ED Provider Notes (Signed)
Patient is ambulatory. No neurological deficits. No evidence of psychosis. No suicidal or homicidal ideation. Will discharge.   Donnetta HutchingBrian Talmadge Ganas, MD 02/14/16 [phone]

## 2016-02-14 NOTE — ED Notes (Signed)
Patient ambulated 40 feet with no assistance. 

## 2016-02-14 NOTE — Discharge Instructions (Signed)
Follow-up with community mental health. °
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
## 2016-02-14 NOTE — ED Notes (Signed)
Ambulated to bathroom without difficulty

## 2016-02-14 NOTE — ED Notes (Addendum)
Pt request coke related to dry mouth since just waking up. Pt given coke. Sitter at bedside.

## 2016-02-18 ENCOUNTER — Emergency Department (HOSPITAL_COMMUNITY)
Admission: EM | Admit: 2016-02-18 | Discharge: 2016-02-18 | Disposition: A | Discharge status: Home / Self Care | Payer: Medicaid Other | Attending: Emergency Medicine | Admitting: Emergency Medicine

## 2016-02-18 ENCOUNTER — Encounter (HOSPITAL_COMMUNITY): Payer: Self-pay | Admitting: Emergency Medicine

## 2016-02-18 DIAGNOSIS — Z79899 Other long term (current) drug therapy: Secondary | ICD-10-CM | POA: Insufficient documentation

## 2016-02-18 DIAGNOSIS — F17200 Nicotine dependence, unspecified, uncomplicated: Secondary | ICD-10-CM | POA: Insufficient documentation

## 2016-02-18 DIAGNOSIS — I10 Essential (primary) hypertension: Secondary | ICD-10-CM | POA: Diagnosis not present

## 2016-02-18 DIAGNOSIS — J45909 Unspecified asthma, uncomplicated: Secondary | ICD-10-CM | POA: Insufficient documentation

## 2016-02-18 DIAGNOSIS — Z760 Encounter for issue of repeat prescription: Secondary | ICD-10-CM | POA: Diagnosis not present

## 2016-02-18 MED ORDER — OXCARBAZEPINE 300 MG PO TABS
600.0000 mg | ORAL_TABLET | Freq: Once | ORAL | Status: AC
  Administered 2016-02-18: 600 mg via ORAL
  Filled 2016-02-18: qty 2

## 2016-02-18 MED ORDER — CHLORPROMAZINE HCL 25 MG PO TABS
100.0000 mg | ORAL_TABLET | Freq: Once | ORAL | Status: AC
  Administered 2016-02-18: 100 mg via ORAL
  Filled 2016-02-18: qty 4

## 2016-02-18 MED ORDER — CHLORPROMAZINE HCL 100 MG PO TABS: ORAL_TABLET | ORAL | 0 refills | Status: DC

## 2016-02-18 MED ORDER — BENZTROPINE MESYLATE 1 MG PO TABS: 1.0000 mg | ORAL_TABLET | Freq: Two times a day (BID) | ORAL | 0 refills | Status: DC

## 2016-02-18 MED ORDER — HALOPERIDOL 5 MG PO TABS
10.0000 mg | ORAL_TABLET | Freq: Once | ORAL | Status: AC
  Administered 2016-02-18: 10 mg via ORAL
  Filled 2016-02-18: qty 2

## 2016-02-18 MED ORDER — HALOPERIDOL 10 MG PO TABS: 10.0000 mg | ORAL_TABLET | Freq: Two times a day (BID) | ORAL | 0 refills | Status: DC

## 2016-02-18 MED ORDER — OXCARBAZEPINE 600 MG PO TABS: 600.0000 mg | ORAL_TABLET | Freq: Two times a day (BID) | ORAL | 0 refills | Status: DC

## 2016-02-18 MED ORDER — LITHIUM CARBONATE 300 MG PO TABS: 300.0000 mg | ORAL_TABLET | Freq: Three times a day (TID) | ORAL | 0 refills | Status: DC

## 2016-02-18 MED ORDER — LITHIUM CARBONATE ER 300 MG PO TBCR
600.0000 mg | EXTENDED_RELEASE_TABLET | Freq: Once | ORAL | Status: AC
  Administered 2016-02-18: 600 mg via ORAL
  Filled 2016-02-18: qty 2

## 2016-02-18 MED ORDER — BENZTROPINE MESYLATE 1 MG PO TABS
1.0000 mg | ORAL_TABLET | Freq: Once | ORAL | Status: AC
  Administered 2016-02-18: 1 mg via ORAL
  Filled 2016-02-18: qty 1

## 2016-02-18 NOTE — ED Provider Notes (Signed)
[facility]-EMERGENCY DEPT Provider Note   CSN: [id number] Arrival date & time: 02/18/16  1309     History   Chief Complaint Chief Complaint  Patient presents with  . Medical Clearance    HPI [name] [name] is a 40 y.o. male. Patient has a history of attorney, schizo affective disorder, cocaine and cannabis use. On multiple medications.  He states he is compliant with medications but ran out of them on Christmas. He states [facility]'s [facility] and "they wouldn't refill them". He presents here today calm alert lucid and oriented requesting a refill his medications.  HPI  Past Medical History:  Diagnosis Date  . Aggression   . Alcohol abuse   . Asthma   . Homeless   . Hypertension   . Schizophrenia [facility](HCC)     Patient Active Problem List   Diagnosis Date Noted  . Cocaine abuse 10/04/2015  . Suicidal ideation 09/21/2015  . Cannabis use disorder, severe, dependence (HCC) 06/18/2015  . Behavior disturbance   . Schizoaffective disorder, bipolar type (HCC) 04/28/2015  . Schizophrenia (HCC) 10/10/2014    Past Surgical History:  Procedure Laterality Date  . KNEE SURGERY    . MOUTH SURGERY         Home Medications    Prior to Admission medications   Medication Sig Start Date End Date Taking? Authorizing Provider  albuterol (PROVENTIL HFA;VENTOLIN HFA) 108 (90 Base) MCG/ACT inhaler Inhale 2 puffs into the lungs every 6 (six) hours as needed for wheezing or shortness of breath.   Yes Historical Provider, MD  haloperidol decanoate (HALDOL DECANOATE) 100 MG/ML injection Inject 150 mg into the muscle every 28 (twenty-eight) days.    Yes Historical Provider, MD  benztropine (COGENTIN) 1 MG tablet Take 1 tablet (1 mg total) by mouth 2 (two) times daily. 02/18/16   Rolland PorterMark Journe Hallmark, MD  chlorproMAZINE (THORAZINE) 100 MG tablet 100 mg every morning. 300 Mg every evening 02/18/16   Rolland PorterMark Harlan Ervine, MD  diphenhydrAMINE (BENADRYL) 25 MG tablet Take 1 tablet (25 mg total) by mouth every 6  (six) hours. Patient not taking: Reported on 02/18/2016 09/28/15   Emi HolesAlexandra M Law, PA-C  haloperidol (HALDOL) 10 MG tablet Take 1 tablet (10 mg total) by mouth 2 (two) times daily. 02/18/16   Rolland PorterMark Roxie Kreeger, MD  hydrochlorothiazide (MICROZIDE) 12.5 MG capsule Take 1 capsule (12.5 mg total) by mouth daily. Patient not taking: Reported on 02/18/2016 09/11/15   Kristeen MansFran E Hobson, NP  hydrocortisone cream 1 % Apply to affected area 2 times daily Patient not taking: Reported on 02/18/2016 09/28/15   Emi HolesAlexandra M Law, PA-C  lithium 300 MG tablet Take 1 tablet (300 mg total) by mouth 3 (three) times daily. 02/18/16   Rolland PorterMark Gerlean Cid, MD  oxcarbazepine (TRILEPTAL) 600 MG tablet Take 1 tablet (600 mg total) by mouth 2 (two) times daily. 02/18/16   Rolland PorterMark Tyronne Blann, MD  traZODone (DESYREL) 100 MG tablet Take 1 tablet (100 mg total) by mouth at bedtime. Patient not taking: Reported on 02/18/2016 09/11/15   Kristeen MansFran E Hobson, NP  ziprasidone (GEODON) 60 MG capsule Take 1 capsule (60 mg total) by mouth 2 (two) times daily with a meal. Patient not taking: Reported on 02/18/2016 09/11/15   Kristeen MansFran E Hobson, NP    Family History No family history on file.  Social History Social History  Substance Use Topics  . Smoking status: Current Every Day Smoker  . Smokeless tobacco: Never Used  . Alcohol use Yes     Allergies  Sudafed [pseudoephedrine] and Food   Review of Systems Review of Systems  Constitutional: Negative for appetite change, chills, diaphoresis, fatigue and fever.  HENT: Negative for mouth sores, sore throat and trouble swallowing.   Eyes: Negative for visual disturbance.  Respiratory: Negative for cough, chest tightness, shortness of breath and wheezing.   Cardiovascular: Negative for chest pain.  Gastrointestinal: Negative for abdominal distention, abdominal pain, diarrhea, nausea and vomiting.  Endocrine: Negative for polydipsia, polyphagia and polyuria.  Genitourinary: Negative for dysuria, frequency and  hematuria.  Musculoskeletal: Negative for gait problem.  Skin: Negative for color change, pallor and rash.  Neurological: Negative for dizziness, syncope, light-headedness and headaches.  Hematological: Does not bruise/bleed easily.  Psychiatric/Behavioral: Negative for behavioral problems and confusion.     Physical Exam Updated Vital Signs BP 137/92 (BP Location: Left Arm)   Pulse 100   Temp 98.6 F (37 C) (Oral)   Resp 16   SpO2 100%   Physical Exam  Constitutional: He is oriented to person, place, and time. He appears well-developed and well-nourished. No distress.  HENT:  Head: Normocephalic.  Eyes: Conjunctivae are normal. Pupils are equal, round, and reactive to light. No scleral icterus.  Neck: Normal range of motion. Neck supple. No thyromegaly present.  Cardiovascular: Normal rate and regular rhythm.  Exam reveals no gallop and no friction rub.   No murmur heard. Pulmonary/Chest: Effort normal and breath sounds normal. No respiratory distress. He has no wheezes. He has no rales.  Abdominal: Soft. Bowel sounds are normal. He exhibits no distension. There is no tenderness. There is no rebound.  Musculoskeletal: Normal range of motion.  Neurological: He is alert and oriented to person, place, and time.  Skin: Skin is warm and dry. No rash noted.  Psychiatric: He has a normal mood and affect. His behavior is normal.     ED Treatments / Results  Labs (all labs ordered are listed, but only abnormal results are displayed) Labs Reviewed - No data to display  EKG  EKG Interpretation None       Radiology No results found.  Procedures Procedures (including critical care time)  Medications Ordered in ED Medications  benztropine (COGENTIN) tablet 1 mg (not administered)  lithium carbonate (LITHOBID) CR tablet 600 mg (not administered)  Oxcarbazepine (TRILEPTAL) tablet 600 mg (not administered)  chlorproMAZINE (THORAZINE) tablet 100 mg (not administered)    haloperidol (HALDOL) tablet 10 mg (not administered)     Initial Impression / Assessment and Plan / ED Course  I have reviewed the triage vital signs and the nursing notes.  Pertinent labs & imaging results that were available during my care of the patient were reviewed by me and considered in my medical decision making (see chart for details).  Clinical Course     Medication reviewed and confirmed. Given a one-month supply of his active medications. Was given a first dose in the emergency room today.  Final Clinical Impressions(s) / ED Diagnoses   Final diagnoses:  Medication refill    New Prescriptions New Prescriptions   BENZTROPINE (COGENTIN) 1 MG TABLET    Take 1 tablet (1 mg total) by mouth 2 (two) times daily.   CHLORPROMAZINE (THORAZINE) 100 MG TABLET    100 mg every morning. 300 Mg every evening   HALOPERIDOL (HALDOL) 10 MG TABLET    Take 1 tablet (10 mg total) by mouth 2 (two) times daily.   LITHIUM 300 MG TABLET    Take 1 tablet (300 mg total) by mouth 3 (  three) times daily.   OXCARBAZEPINE (TRILEPTAL) 600 MG TABLET    Take 1 tablet (600 mg total) by mouth 2 (two) times daily.     Rolland Porter, MD 02/18/16 [phone]

## 2016-02-18 NOTE — Discharge Instructions (Signed)
Continue your outpatient appointments, and medications as prescribed.

## 2016-02-18 NOTE — ED Triage Notes (Addendum)
Patient reports he is not SI or HI.  He is here for medication refill. [facility]

## 2016-02-18 NOTE — ED Notes (Signed)
Patient received medication and refused to sign discharge papers

## 2016-02-22 ENCOUNTER — Emergency Department (HOSPITAL_COMMUNITY)
Admission: EM | Admit: 2016-02-22 | Discharge: 2016-02-22 | Discharge status: Against Medical Advice | Payer: Medicaid Other

## 2016-02-22 NOTE — ED Notes (Signed)
Pt here for dose of medications tonight.  [facility] several days ago, unable to get medications filled until tomorrow.  Pt does not want to wait, [facility] [facility]

## 2016-03-03 ENCOUNTER — Emergency Department (HOSPITAL_COMMUNITY)
Admission: EM | Admit: 2016-03-03 | Discharge: 2016-03-04 | Disposition: A | Discharge status: Home / Self Care | Payer: Medicaid Other | Attending: Emergency Medicine | Admitting: Emergency Medicine

## 2016-03-03 ENCOUNTER — Emergency Department (HOSPITAL_COMMUNITY): Payer: Medicaid Other

## 2016-03-03 ENCOUNTER — Encounter (HOSPITAL_COMMUNITY): Payer: Self-pay

## 2016-03-03 DIAGNOSIS — I10 Essential (primary) hypertension: Secondary | ICD-10-CM | POA: Diagnosis not present

## 2016-03-03 DIAGNOSIS — F17200 Nicotine dependence, unspecified, uncomplicated: Secondary | ICD-10-CM | POA: Insufficient documentation

## 2016-03-03 DIAGNOSIS — Z79899 Other long term (current) drug therapy: Secondary | ICD-10-CM | POA: Insufficient documentation

## 2016-03-03 DIAGNOSIS — R52 Pain, unspecified: Secondary | ICD-10-CM

## 2016-03-03 DIAGNOSIS — J45909 Unspecified asthma, uncomplicated: Secondary | ICD-10-CM | POA: Diagnosis not present

## 2016-03-03 DIAGNOSIS — M79601 Pain in right arm: Secondary | ICD-10-CM | POA: Diagnosis not present

## 2016-03-03 MED ORDER — ENOXAPARIN SODIUM 100 MG/ML ~~LOC~~ SOLN
1.0000 mg/kg | Freq: Once | SUBCUTANEOUS | Status: AC
  Administered 2016-03-04: 90 mg via SUBCUTANEOUS
  Filled 2016-03-03: qty 1

## 2016-03-03 NOTE — ED Triage Notes (Addendum)
[facility] [place]; reports pain and swelling to right hand and forearm, denies injury. Note from jail reports "swelling, pain and decreased ROM to right UE, r/o DVT."

## 2016-03-03 NOTE — Discharge Instructions (Signed)
For pain control please take Ibuprofen (also known as Motrin or Advil) 400mg (this is normally 2 over the counter pills) every 6 hours. Take with food to minimize stomach irritation. ° ° °

## 2016-03-03 NOTE — ED Provider Notes (Signed)
[facility]-EMERGENCY DEPT Provider Note   CSN: [id number] Arrival date & time: 03/03/16  2240  By signing my name below, I, Rosario Adie, attest that this documentation has been prepared under the direction and in the presence of [employer], PA-C.  Electronically Signed: Rosario Adie, ED Scribe. 03/03/16. 11:57 PM.  History   Chief Complaint Chief Complaint  Patient presents with  . Arm Pain   The history is provided by the patient and the police. No language interpreter was used.   HPI Comments: [name] [name] is a 41 y.o. male [employer], who presents to the Emergency Department complaining of right hand /forearm pain w/ associated swelling to the area beginning two nights ago. No recent trauma, injury, or falls to precipitate his pain/swelling. No noted treatments for his pain and swelling were tried for his symptoms. Pt was evaluated in his detention facility prior to his arrival in the ED, and he was subsequent referred into the ED to r/o DVT. No Hx of PE/DVT. He denies chest pain, shortness of breath, or any other associated symptoms.   Past Medical History:  Diagnosis Date  . Aggression   . Alcohol abuse   . Asthma   . Homeless   . Hypertension   . Schizophrenia [facility])    Patient Active Problem List   Diagnosis Date Noted  . Cocaine abuse 10/04/2015  . Suicidal ideation 09/21/2015  . Cannabis use disorder, severe, dependence (HCC) 06/18/2015  . Behavior disturbance   . Schizoaffective disorder, bipolar type (HCC) 04/28/2015  . Schizophrenia (HCC) 10/10/2014   Past Surgical History:  Procedure Laterality Date  . KNEE SURGERY    . MOUTH SURGERY      Home Medications    Prior to Admission medications   Medication Sig Start Date End Date Taking? Authorizing Provider  albuterol (PROVENTIL HFA;VENTOLIN HFA) 108 (90 Base) MCG/ACT inhaler Inhale 2 puffs into the lungs every 6 (six) hours as needed for wheezing or shortness of breath.    Historical  Provider, MD  benztropine (COGENTIN) 1 MG tablet Take 1 tablet (1 mg total) by mouth 2 (two) times daily. 02/18/16   Rolland Porter, MD  chlorproMAZINE (THORAZINE) 100 MG tablet 100 mg every morning. 300 Mg every evening 02/18/16   Rolland Porter, MD  diphenhydrAMINE (BENADRYL) 25 MG tablet Take 1 tablet (25 mg total) by mouth every 6 (six) hours. Patient not taking: Reported on 02/18/2016 09/28/15   Emi Holes, PA-C  haloperidol (HALDOL) 10 MG tablet Take 1 tablet (10 mg total) by mouth 2 (two) times daily. 02/18/16   Rolland Porter, MD  haloperidol decanoate (HALDOL DECANOATE) 100 MG/ML injection Inject 150 mg into the muscle every 28 (twenty-eight) days.     Historical Provider, MD  hydrochlorothiazide (MICROZIDE) 12.5 MG capsule Take 1 capsule (12.5 mg total) by mouth daily. Patient not taking: Reported on 02/18/2016 09/11/15   Kristeen Mans, NP  hydrocortisone cream 1 % Apply to affected area 2 times daily Patient not taking: Reported on 02/18/2016 09/28/15   Emi Holes, PA-C  lithium 300 MG tablet Take 1 tablet (300 mg total) by mouth 3 (three) times daily. 02/18/16   Rolland Porter, MD  oxcarbazepine (TRILEPTAL) 600 MG tablet Take 1 tablet (600 mg total) by mouth 2 (two) times daily. 02/18/16   Rolland Porter, MD  traZODone (DESYREL) 100 MG tablet Take 1 tablet (100 mg total) by mouth at bedtime. Patient not taking: Reported on 02/18/2016 09/11/15   Kristeen Mans, NP  ziprasidone (GEODON) 60 MG capsule Take 1 capsule (60 mg total) by mouth 2 (two) times daily with a meal. Patient not taking: Reported on 02/18/2016 09/11/15   Kristeen MansFran E Hobson, NP   Family History No family history on file.  Social History Social History  Substance Use Topics  . Smoking status: Current Every Day Smoker  . Smokeless tobacco: Never Used  . Alcohol use Yes   Allergies   Sudafed [pseudoephedrine] and Food  Review of Systems Review of Systems  A complete 10 system review of systems was obtained and all systems are  negative except as noted in the HPI and PMH.   Physical Exam Updated Vital Signs BP 137/81 (BP Location: Right Arm)   Pulse 71   Temp 97.9 F (36.6 C) (Oral)   Resp 18   Wt 198 lb 6.6 oz (90 kg)   SpO2 100%   BMI 28.47 kg/m   Physical Exam  Constitutional: He is oriented to person, place, and time. He appears well-developed and well-nourished. No distress.  HENT:  Head: Normocephalic and atraumatic.  Mouth/Throat: Oropharynx is clear and moist.  Eyes: Conjunctivae and EOM are normal. Pupils are equal, round, and reactive to light.  Neck: Normal range of motion. No JVD present. No tracheal deviation present.  Cardiovascular: Normal rate, regular rhythm and intact distal pulses.  Exam reveals no gallop and no friction rub.   No murmur heard. Radial pulse equal bilaterally  Pulmonary/Chest: Effort normal and breath sounds normal. No stridor. No respiratory distress. He has no wheezes. He has no rales. He exhibits no tenderness.  Abdominal: Soft. He exhibits no distension and no mass. There is no tenderness. There is no rebound and no guarding. No hernia.  Musculoskeletal: Normal range of motion. He exhibits edema and tenderness.  Diffuse edema to right wrist and hands, 1+ and pitting. Radial pulses 2+, diffusely tender to palpation along hands and distal forearm. Compartments soft, full range of motion, no pain with passive extension.  No calf asymmetry, superficial collaterals, palpable cords, edema, Homans sign negative bilaterally.    Neurological: He is alert and oriented to person, place, and time.  Skin: Skin is warm. Capillary refill takes less than 2 seconds. He is not diaphoretic.  Psychiatric: He has a normal mood and affect.  Nursing note and vitals reviewed.  ED Treatments / Results  DIAGNOSTIC STUDIES: Oxygen Saturation is 100% on RA, normal by my interpretation.   COORDINATION OF CARE: 11:57 PM-Discussed next steps with pt. Pt verbalized understanding and is  agreeable with the plan.   Labs (all labs ordered are listed, but only abnormal results are displayed) Labs Reviewed - No data to display  EKG  EKG Interpretation None      Radiology Dg Wrist Complete Right  Result Date: 03/03/2016 CLINICAL DATA:  Swelling EXAM: RIGHT WRIST - COMPLETE 3+ VIEW COMPARISON:  04/30/2011 FINDINGS: There is no evidence of fracture or dislocation. There is no evidence of arthropathy or other focal bone abnormality. IMPRESSION: No acute osseous abnormality Electronically Signed   By: Jasmine PangKim  Fujinaga M.D.   On: 03/03/2016 23:32   Dg Hand Complete Right  Result Date: 03/03/2016 CLINICAL DATA:  Pain and swelling for 3 days EXAM: RIGHT HAND - COMPLETE 3+ VIEW COMPARISON:  None. FINDINGS: There is no evidence of fracture or dislocation. There is no evidence of arthropathy or other focal bone abnormality. Diffuse soft tissue swelling. IMPRESSION: No acute osseous abnormality Electronically Signed   By: Adrian ProwsKim  Fujinaga M.D.  On: 03/03/2016 23:31   Procedures Procedures   Medications Ordered in ED Medications  enoxaparin (LOVENOX) injection 90 mg (not administered)    Initial Impression / Assessment and Plan / ED Course  I have reviewed the triage vital signs and the nursing notes.  Pertinent labs & imaging results that were available during my care of the patient were reviewed by me and considered in my medical decision making (see chart for details).  Clinical Course    Vitals:   03/03/16 2255 03/03/16 2300  BP: 137/81   Pulse: 71   Resp: 18   Temp: 97.9 F (36.6 C)   TempSrc: Oral   SpO2: 100%   Weight:  90 kg    Medications  enoxaparin (LOVENOX) injection 90 mg (not administered)    Bowman Higbie is 41 y.o. male presenting withAtraumatic swelling and pain to right wrist and arm. X-rays negative. No risk factor for DVT/PE. Neurovascularly intact. Patient given Lovenox and advised to return to vascular imaging suite tomorrow to obtain duplex  study. No chest pain, cough, fever, tachycardia, hemoptysis to suggest PE.  Evaluation does not show pathology that would require ongoing emergent intervention or inpatient treatment. Pt is hemodynamically stable and mentating appropriately. Discussed findings and plan with patient/guardian, who agrees with care plan. All questions answered. Return precautions discussed and outpatient follow up given.      Final Clinical Impressions(s) / ED Diagnoses   Final diagnoses:  Pain  Right arm pain   New Prescriptions New Prescriptions   No medications on file   I personally performed the services described in this documentation, which was scribed in my presence. The recorded information has been reviewed and is accurate.      Wynetta Emery, PA-C 03/04/16 0021    Layla Maw Ward, DO 03/04/16 [phone]

## 2016-03-04 ENCOUNTER — Ambulatory Visit (HOSPITAL_COMMUNITY): Admission: RE | Admit: 2016-03-04 | Payer: Medicaid Other | Source: Ambulatory Visit

## 2016-04-01 IMAGING — CR DG CHEST 2V
2 series · 2 of 2 positions shown · non-contrast
Comparison: 09/02/2014 and 04/22/2014.

CLINICAL DATA: Chest pain after losing consciousness today and
falling. History of asthma and hypertension.

EXAM:
CHEST  2 VIEW

[chest ap]
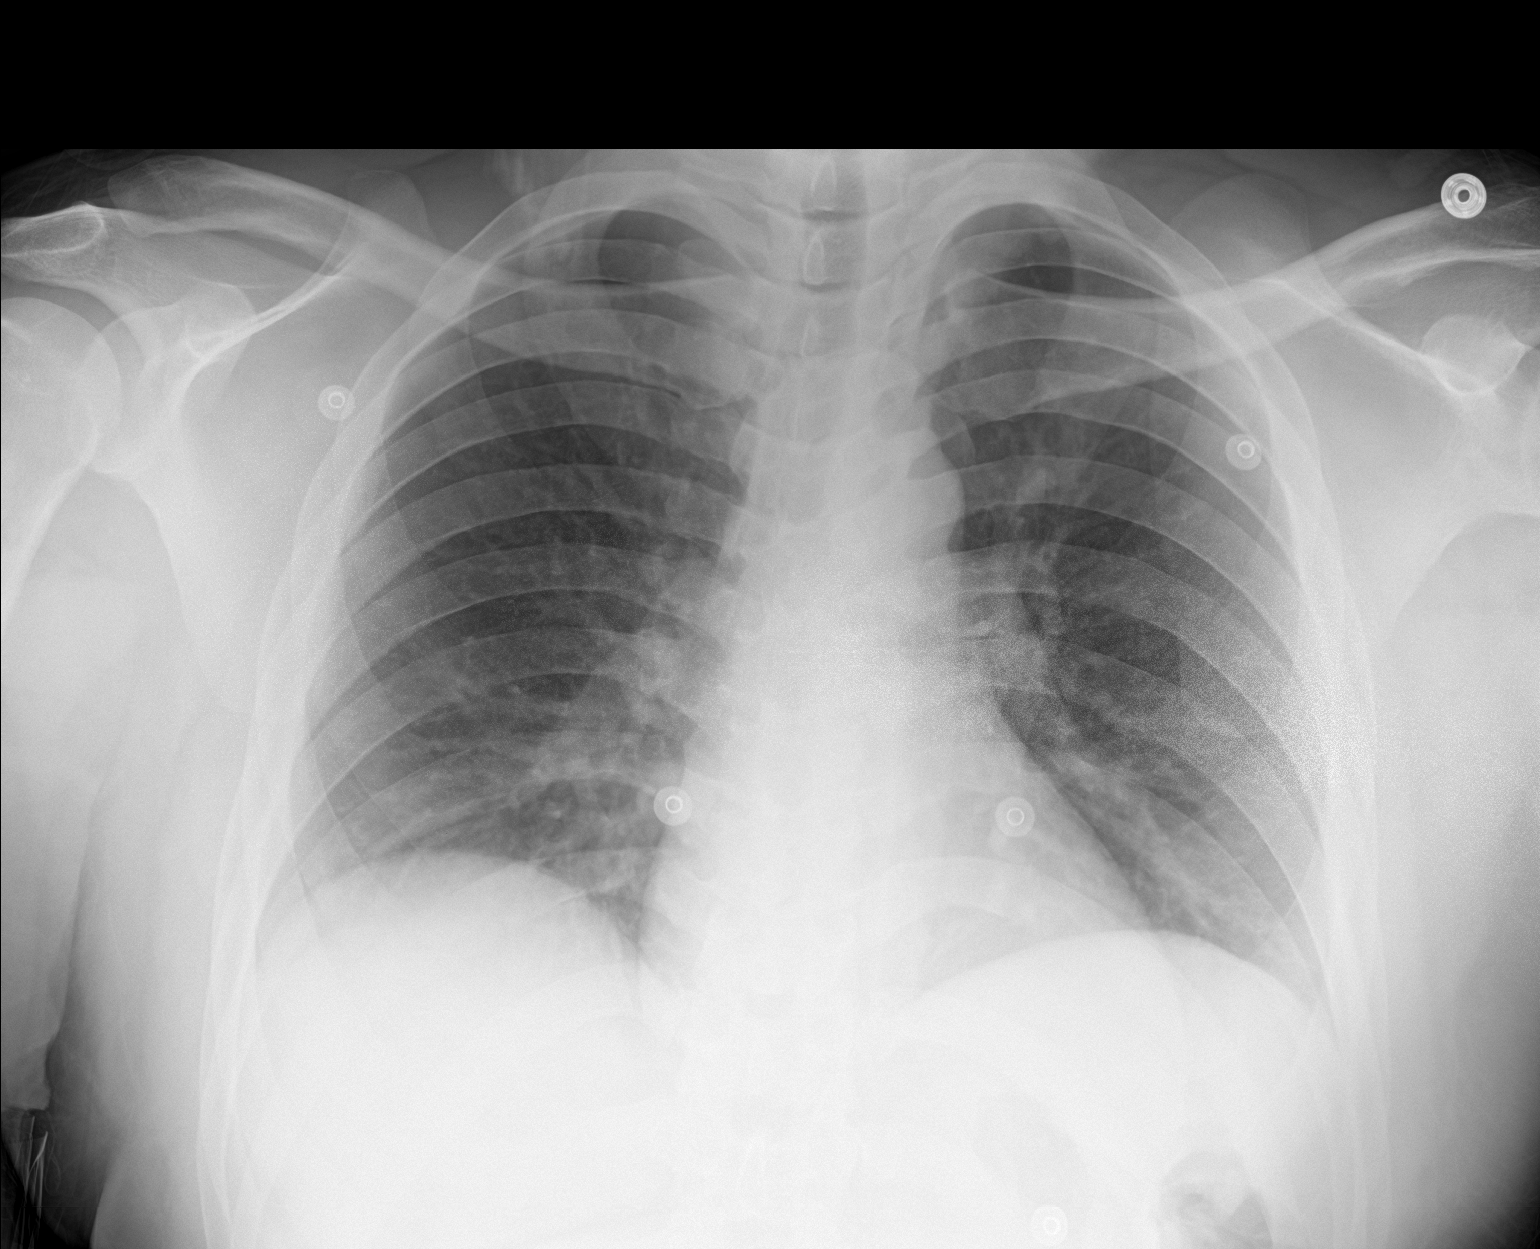

[chest lat]
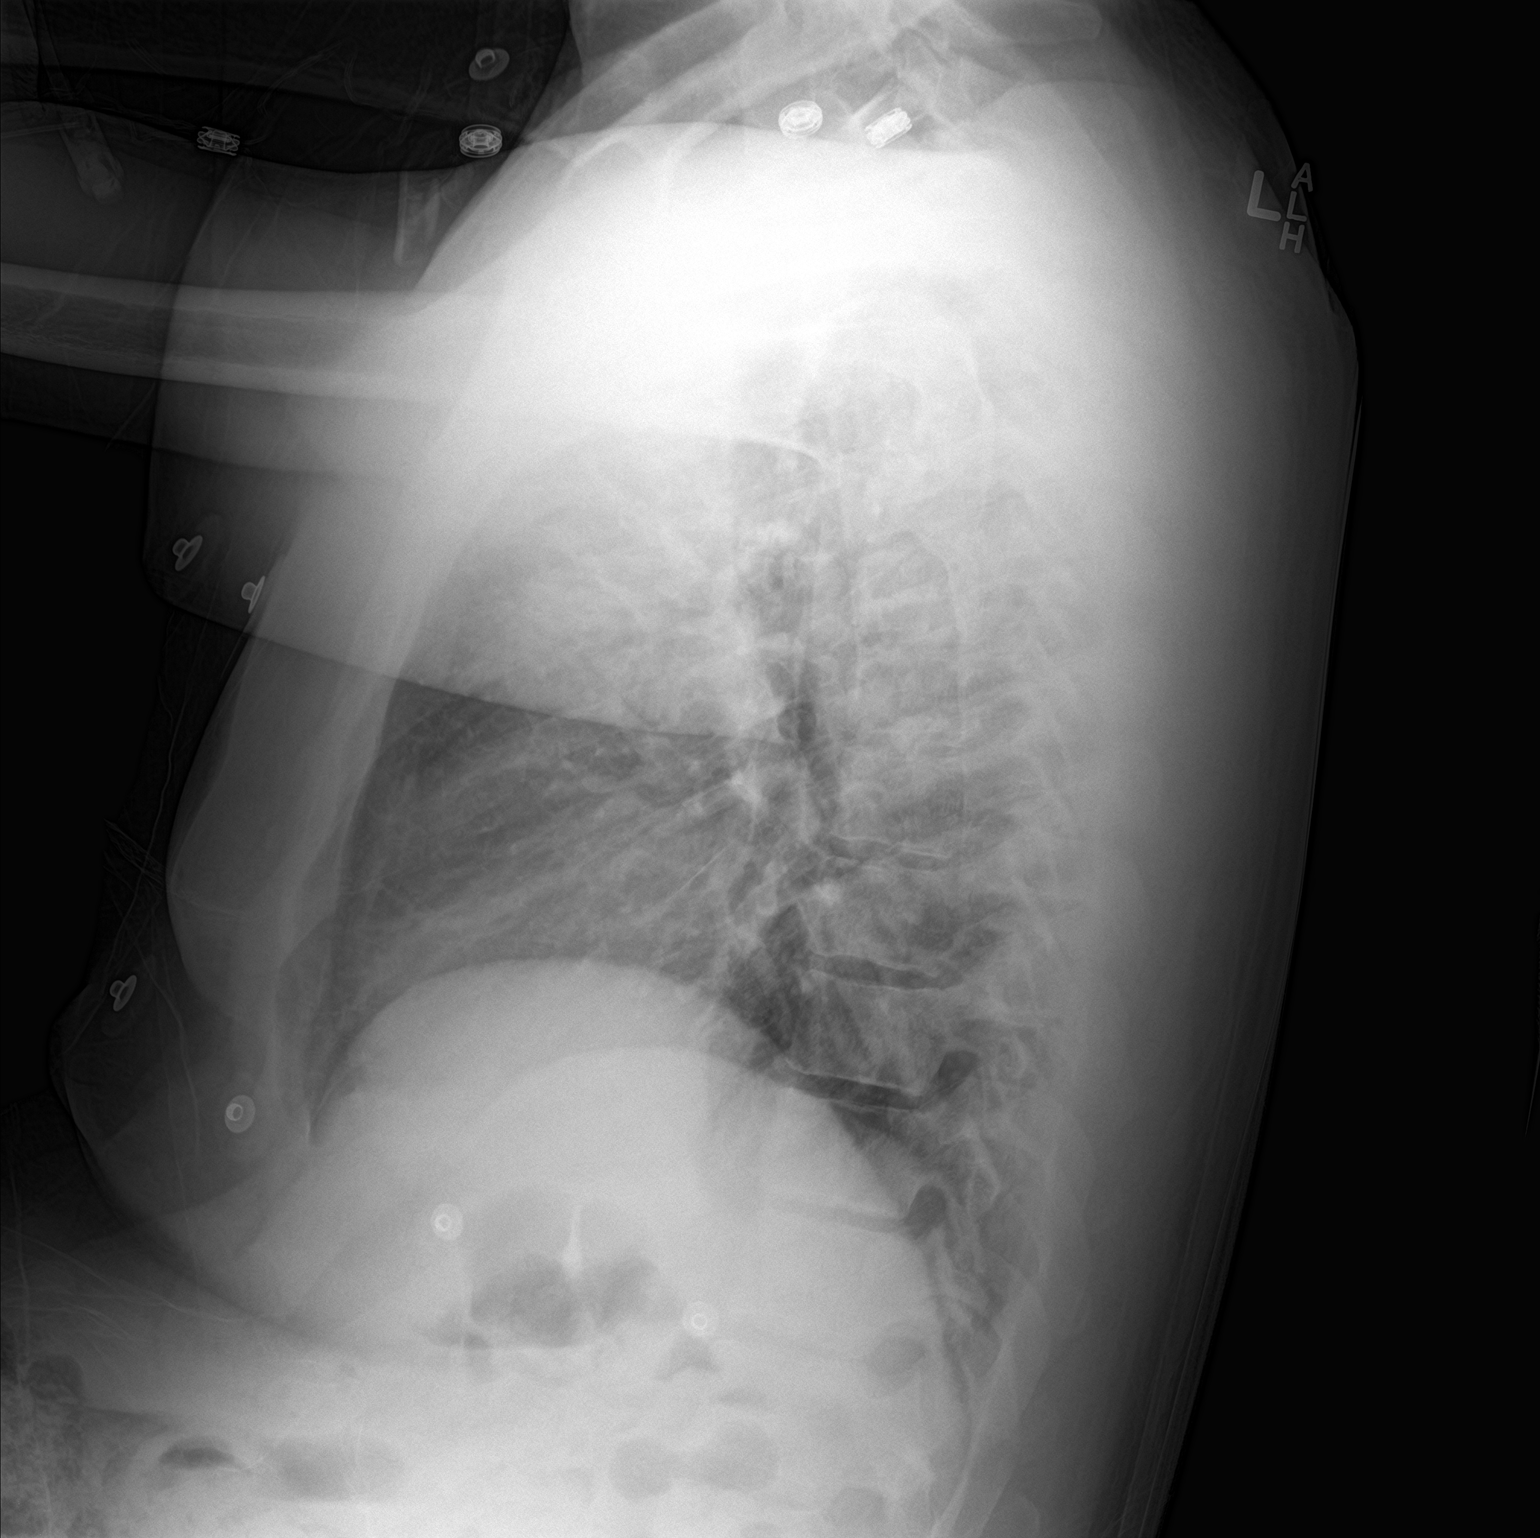

[2 of 2 positions shown; findings below may reference images not displayed]

FINDINGS: The heart size and mediastinal contours are stable. There are
chronic low lung volumes without evidence of airspace disease, edema
or significant pleural effusion. The bones appear unchanged.
IMPRESSION: Stable chest.  No acute cardiopulmonary process.

## 2016-06-05 ENCOUNTER — Encounter (HOSPITAL_COMMUNITY): Payer: Self-pay | Admitting: Emergency Medicine

## 2016-06-05 ENCOUNTER — Emergency Department (HOSPITAL_COMMUNITY)
Admission: EM | Admit: 2016-06-05 | Discharge: 2016-06-05 | Disposition: A | Discharge status: Home / Self Care | Payer: Medicaid Other | Attending: Emergency Medicine | Admitting: Emergency Medicine

## 2016-06-05 ENCOUNTER — Emergency Department (HOSPITAL_COMMUNITY): Payer: Medicaid Other

## 2016-06-05 DIAGNOSIS — M7989 Other specified soft tissue disorders: Secondary | ICD-10-CM | POA: Insufficient documentation

## 2016-06-05 DIAGNOSIS — Z79899 Other long term (current) drug therapy: Secondary | ICD-10-CM | POA: Diagnosis not present

## 2016-06-05 DIAGNOSIS — F17200 Nicotine dependence, unspecified, uncomplicated: Secondary | ICD-10-CM | POA: Diagnosis not present

## 2016-06-05 DIAGNOSIS — I10 Essential (primary) hypertension: Secondary | ICD-10-CM | POA: Diagnosis not present

## 2016-06-05 DIAGNOSIS — J45909 Unspecified asthma, uncomplicated: Secondary | ICD-10-CM | POA: Diagnosis not present

## 2016-06-05 NOTE — ED Notes (Signed)
PT DISCHARGED. INSTRUCTIONS GIVEN TO THE POLICE OFFICERS. AAOX4. PT IN NO APPARENT DISTRESS OR PAIN. THE OPPORTUNITY TO ASK QUESTIONS WAS PROVIDED.

## 2016-06-05 NOTE — ED Provider Notes (Signed)
[facility]-EMERGENCY DEPT Provider Note   CSN: [id number] Arrival date & time: 06/05/16  1903  By signing my name below, I, Diona Browner, attest that this documentation has been prepared under the direction and in the presence of [facility], PA-C.  Electronically Signed: Diona Browner, ED Scribe. 06/05/16. 9:42 PM.   History   Chief Complaint Chief Complaint  Patient presents with  . HAND SWELLING    HPI [name] [name] is a 41 y.o. male who [facility] the Emergency Department from jail [facility]IB [facility] complaining of gradually worsening right hand swelling that started in January. Pt was seen in the ED on 03/03/16 for the same sx. He was evaluated and found to have no fracture and had an ultrasound the next day in the jail with no evidence of DVT. He states swelling is constant but notes it worsened last week. Denies any known trauma or overuse. Also notes cold feeling to hand which has been constant since January. He notes pain is throbbing and an 8/10 severity and radiates from dorsum of hand to mid-forearm. Associated sx include subjective right ankle swelling that started on 06/01/16 and feeling light-headed for the last two days. Pt notes his fingers feel numb due to the swelling. States it is difficult to use his hand due to the swelling. He is currently taking ibuprofen and steroids with no relief. No recent falls or injuries. Pt denies numbness, tingling, HA, fever, chills, back pain, neck pain, nausea, vomiting and diarrhea.  The history is provided by the patient and medical records. No language interpreter was used.    Past Medical History:  Diagnosis Date  . Aggression   . Alcohol abuse   . Asthma   . Homeless   . Hypertension   . Schizophrenia [facility])     Patient Active Problem List   Diagnosis Date Noted  . Cocaine abuse 10/04/2015  . Suicidal ideation 09/21/2015  . Cannabis use disorder, severe, dependence (HCC) 06/18/2015  . Behavior disturbance   . Schizoaffective  disorder, bipolar type (HCC) 04/28/2015  . Schizophrenia (HCC) 10/10/2014    Past Surgical History:  Procedure Laterality Date  . KNEE SURGERY    . MOUTH SURGERY         Home Medications    Prior to Admission medications   Medication Sig Start Date End Date Taking? Authorizing Provider  albuterol (PROVENTIL HFA;VENTOLIN HFA) 108 (90 Base) MCG/ACT inhaler Inhale 2 puffs into the lungs every 6 (six) hours as needed for wheezing or shortness of breath.    Historical Provider, MD  benztropine (COGENTIN) 1 MG tablet Take 1 tablet (1 mg total) by mouth 2 (two) times daily. 02/18/16   Rolland Porter, MD  chlorproMAZINE (THORAZINE) 100 MG tablet 100 mg every morning. 300 Mg every evening 02/18/16   Rolland Porter, MD  diphenhydrAMINE (BENADRYL) 25 MG tablet Take 1 tablet (25 mg total) by mouth every 6 (six) hours. Patient not taking: Reported on 02/18/2016 09/28/15   Emi Holes, PA-C  haloperidol (HALDOL) 10 MG tablet Take 1 tablet (10 mg total) by mouth 2 (two) times daily. 02/18/16   Rolland Porter, MD  haloperidol decanoate (HALDOL DECANOATE) 100 MG/ML injection Inject 150 mg into the muscle every 28 (twenty-eight) days.     Historical Provider, MD  hydrochlorothiazide (MICROZIDE) 12.5 MG capsule Take 1 capsule (12.5 mg total) by mouth daily. Patient not taking: Reported on 02/18/2016 09/11/15   Kristeen Mans, NP  hydrocortisone cream 1 % Apply to affected area 2 times  daily Patient not taking: Reported on 02/18/2016 09/28/15   Emi Holes, PA-C  lithium 300 MG tablet Take 1 tablet (300 mg total) by mouth 3 (three) times daily. 02/18/16   Rolland Porter, MD  oxcarbazepine (TRILEPTAL) 600 MG tablet Take 1 tablet (600 mg total) by mouth 2 (two) times daily. 02/18/16   Rolland Porter, MD  traZODone (DESYREL) 100 MG tablet Take 1 tablet (100 mg total) by mouth at bedtime. Patient not taking: Reported on 02/18/2016 09/11/15   Kristeen Mans, NP  ziprasidone (GEODON) 60 MG capsule Take 1 capsule (60 mg total) by  mouth 2 (two) times daily with a meal. Patient not taking: Reported on 02/18/2016 09/11/15   Kristeen Mans, NP    Family History History reviewed. No pertinent family history.  Social History Social History  Substance Use Topics  . Smoking status: Current Every Day Smoker  . Smokeless tobacco: Never Used  . Alcohol use Yes     Allergies   Sudafed [pseudoephedrine] and Food   Review of Systems Review of Systems  Constitutional: Negative for chills and fever.  Respiratory: Negative for shortness of breath.   Cardiovascular: Negative for chest pain.  Gastrointestinal: Negative for abdominal pain, diarrhea, nausea and vomiting.  Musculoskeletal: Positive for joint swelling. Negative for back pain and neck pain.  Neurological: Positive for light-headedness. Negative for numbness and headaches.     Physical Exam Updated Vital Signs BP (!) 142/83 (BP Location: Left Arm)   Pulse 90   Temp 98 F (36.7 C) (Oral)   Resp 20   Ht  (1.778 m)   Wt 196 lb (88.9 kg)   SpO2 100%   BMI 28.12 kg/m   Physical Exam  Constitutional: He appears well-developed and well-nourished. No distress.  HENT:  Head: Normocephalic and atraumatic.  Eyes: Conjunctivae and EOM are normal. Right eye exhibits no discharge. Left eye exhibits no discharge. No scleral icterus.  Neck: Normal range of motion. No JVD present. No tracheal deviation present.  Cardiovascular: Normal rate, regular rhythm, normal heart sounds and intact distal pulses.   2+ DP/PT pulses, negative Homan's bl and no calf assymetry, 2+ radial pulses bl with good cap refill  Pulmonary/Chest: Effort normal.  Abdominal: He exhibits no distension.  Musculoskeletal: Normal range of motion.  Diffuse swelling of right hand extending to wrist. Limited ROM of wrist. Limited ROM of fingers due to swelling. Pt endorses altered sensation of "it feels rough"; unable to assess strength due to pt not wanting to move his hand. No scaphoid  tenderness. ttp of dorsum of hand, no point of maximal tenderness. No significant ankle effusion noted. No weakness to ankles and full ROM. No point tenderness or ligamentous laxity. No overlying erythema of either joints.   Neurological: He is alert.  Skin: Skin is warm and dry. Capillary refill takes less than 2 seconds. No rash noted. He is not diaphoretic. No erythema. No pallor.  Psychiatric: He has a normal mood and affect. His behavior is normal.  Nursing note and vitals reviewed.    ED Treatments / Results  DIAGNOSTIC STUDIES: Oxygen Saturation is 100% on RA, normal by my interpretation.   COORDINATION OF CARE: 9:16 PM-Discussed next steps with pt. Pt verbalized understanding and is agreeable with the plan.    Labs (all labs ordered are listed, but only abnormal results are displayed) Labs Reviewed - No data to display  EKG  EKG Interpretation None       Radiology Dg Wrist  Complete Right  Result Date: 06/05/2016 CLINICAL DATA:  Chronic intermittent right hand swelling. Initial encounter. EXAM: RIGHT WRIST - COMPLETE 3+ VIEW COMPARISON:  Right hand radiographs performed 03/03/2016 FINDINGS: There is no evidence of fracture or dislocation. The carpal rows are intact, and demonstrate normal alignment. The joint spaces are preserved. Diffuse soft tissue swelling is noted about the hand. IMPRESSION: No evidence of fracture or dislocation. Electronically Signed   By: Roanna Raider M.D.   On: 06/05/2016 21:56   Dg Hand Complete Right  Result Date: 06/05/2016 CLINICAL DATA:  Chronic intermittent right hand swelling. Initial encounter. EXAM: RIGHT HAND - COMPLETE 3+ VIEW COMPARISON:  Right hand radiographs performed 03/03/2016 FINDINGS: There is no evidence of fracture or dislocation. The joint spaces are preserved. The carpal rows are intact, and demonstrate normal alignment. Diffuse soft tissue swelling is noted about the hand. IMPRESSION: No evidence of fracture or dislocation.  Electronically Signed   By: Roanna Raider M.D.   On: 06/05/2016 21:55    Procedures Procedures (including critical care time)  Medications Ordered in ED Medications - No data to display   Initial Impression / Assessment and Plan / ED Course  I have reviewed the triage vital signs and the nursing notes.  Pertinent labs & imaging results that were available during my care of the patient were reviewed by me and considered in my medical decision making (see chart for details).     41 y.o. male presenting with atraumatic swelling and pain to right wrist and arm for 3 months. Pt afebrile, vitals at pt's baseline. X-rays negative. Low suspicion of DVT/PE without risk factors. Do not suspect gout or septic arthritis.  Possibly related to extended wear of handcuffs or presentation of a rheumatoid condition. No further emergent workup required. Givne the chronic nature of his swelling, pt needs to follow up with orthopedics for further workup. Discussed with pt and law enforcement, and provided with information to give to jail clinic. Discussed Ed return precautions. Pt and Hydrographic surveyor verbalized understanding of and agreement with plan and pt is safe for discharge.   Final Clinical Impressions(s) / ED Diagnoses   Final diagnoses:  Swelling of right hand    New Prescriptions Discharge Medication List as of 06/05/2016 10:27 PM    I personally performed the services described in this documentation, which was scribed in my presence. The recorded information has been reviewed and is accurate.     Jeanie Sewer, PA-C 06/06/16 1037    Linwood Dibbles, MD 06/06/16 2135

## 2016-06-05 NOTE — ED Triage Notes (Signed)
PT presents from jail for evaluation of right hand swelling that has been ongoing since March 03, 2016. Pt states that he was seen at [facility] at that time and dx with cellulitis of the right hand and had ultrasound done at jail the next day. Pt reports limited movement in hand. Pulses present distally.

## 2016-06-06 ENCOUNTER — Telehealth (INDEPENDENT_AMBULATORY_CARE_PROVIDER_SITE_OTHER): Payer: Self-pay

## 2016-06-06 NOTE — Telephone Encounter (Signed)
Can you call and see if they are able to bring patient this afternoon in same day slot? Otherwise, we need to work in for next week some time. He is out of the office Friday. Thanks.

## 2016-06-06 NOTE — Telephone Encounter (Signed)
No answer lvm to call office back if he's available to come in today 06/06/16 @ 4:15pm

## 2016-06-06 NOTE — Telephone Encounter (Signed)
This pt was treated in the ER [facility] for hand pain and swelling. Needed follow up with Dr. Ophelia Charter but nothing is open until 07/11/16

## 2016-06-06 NOTE — Telephone Encounter (Signed)
[name] [name] has spoken with [facility] and made appt for patient. He was unable to be brought in this afternoon.

## 2016-06-13 ENCOUNTER — Ambulatory Visit (INDEPENDENT_AMBULATORY_CARE_PROVIDER_SITE_OTHER): Payer: PRIVATE HEALTH INSURANCE | Admitting: Orthopaedic Surgery

## 2016-06-13 ENCOUNTER — Encounter (INDEPENDENT_AMBULATORY_CARE_PROVIDER_SITE_OTHER): Payer: Self-pay | Admitting: Orthopaedic Surgery

## 2016-06-13 VITALS — BP 118/77 | HR 82 | Ht 70.0 in | Wt 205.0 lb

## 2016-06-13 DIAGNOSIS — M79641 Pain in right hand: Secondary | ICD-10-CM

## 2016-06-13 NOTE — Progress Notes (Signed)
Office Visit Note   Patient: [name] [name]           Date of Birth: June 03, 1975           MRN: [id number] Visit Date: 06/13/2016              Requested by: [facility] [address] [place], [place] [address] PCP: [facility]   Assessment & Plan: Visit Diagnoses:  1. Pain in right hand     Plan: Patient's symptoms when he was in the emergency room with numbness and swelling in his hand has resolved. He has a normal exam and return when necessary.  Follow-Up Instructions: No Follow-up on file.   Orders:  No orders of the defined types were placed in this encounter.  No orders of the defined types were placed in this encounter.     Procedures: No procedures performed   Clinical Data: No additional findings.   Subjective: Chief Complaint  Patient presents with  . Right Hand - Pain    HPI patient is incarcerated and was seen [facility] for 1618 at the time he had swelling in his hand per patient history with negative x-rays. He complained of numbness in the hand. Onset was in January states sometimes it got cold and blue. Patient is here with 2 correction officers and he is in handcuffs. Patient state his hand is no longer swollen is no longer numb it's no longer blue and it doesn't bother him at all. Patient had Doppler at the gel which was negative for DVT. He states his symptoms improved resolved and he doesn't have any problem at the present with his hand.  Review of Systems Review of systems updated positive for history of schizophrenia behavior disorder, cocaine abuse behavioral disturbance. Negative for rest cervical spondylosis. Otherwise negative as it pertains to his history of present illness  Objective: Vital Signs: BP 118/77   Pulse 82   Ht  (1.778 m)   Wt 205 lb (93 kg)   BMI 29.41 kg/m   Physical Exam  Constitutional: He is oriented to person, place, and time. He appears well-developed and well-nourished.  HENT:  Head:  Normocephalic and atraumatic.  Eyes: EOM are normal. Pupils are equal, round, and reactive to light.  Neck: No tracheal deviation present. No thyromegaly present.  Cardiovascular: Normal rate.   Pulmonary/Chest: Effort normal. He has no wheezes.  Abdominal: Soft. Bowel sounds are normal.  Musculoskeletal:  Patient has no hand swelling. All the burn scar over the dorsal forearm. Interossei wrist flexion-extension sensation thenar muscle fundi Sobel my are all normal to exam no numbness in the fingertips no swelling. No lymphadenopathy. Range of motion elbows reach full extension biceps triceps brachioradialis reflexes are 2+ and symmetrical.  Neurological: He is alert and oriented to person, place, and time.  Skin: Skin is warm and dry. Capillary refill takes less than 2 seconds.  Psychiatric: He has a normal mood and affect. His behavior is normal. Judgment and thought content normal.    Ortho Exam  Specialty Comments:  No specialty comments available.  Imaging: No results found.   PMFS History: Patient Active Problem List   Diagnosis Date Noted  . Cocaine abuse 10/04/2015  . Suicidal ideation 09/21/2015  . Cannabis use disorder, severe, dependence (HCC) 06/18/2015  . Behavior disturbance   . Schizoaffective disorder, bipolar type (HCC) 04/28/2015  . Schizophrenia (HCC) 10/10/2014   Past Medical History:  Diagnosis Date  . Aggression   .  Alcohol abuse   . Asthma   . Homeless   . Hypertension   . Schizophrenia (HCC)     No family history on file.  Past Surgical History:  Procedure Laterality Date  . KNEE SURGERY    . MOUTH SURGERY     Social History   Occupational History  . Not on file.   Social History Main Topics  . Smoking status: Current Every Day Smoker  . Smokeless tobacco: Never Used  . Alcohol use Yes  . Drug use: Yes    Types: Cocaine, Marijuana, Hydrocodone, MDMA (Ecstacy)     Comment: former  . Sexual activity: Not on file

## 2016-06-16 ENCOUNTER — Ambulatory Visit (INDEPENDENT_AMBULATORY_CARE_PROVIDER_SITE_OTHER): Payer: Self-pay | Admitting: Orthopaedic Surgery

## 2017-03-07 IMAGING — CR DG WRIST COMPLETE 3+V*R*
4 series · 4 of 4 positions shown · non-contrast
Comparison: 04/30/2011

CLINICAL DATA: Swelling

EXAM:
RIGHT WRIST - COMPLETE 3+ VIEW

[wrist pa]
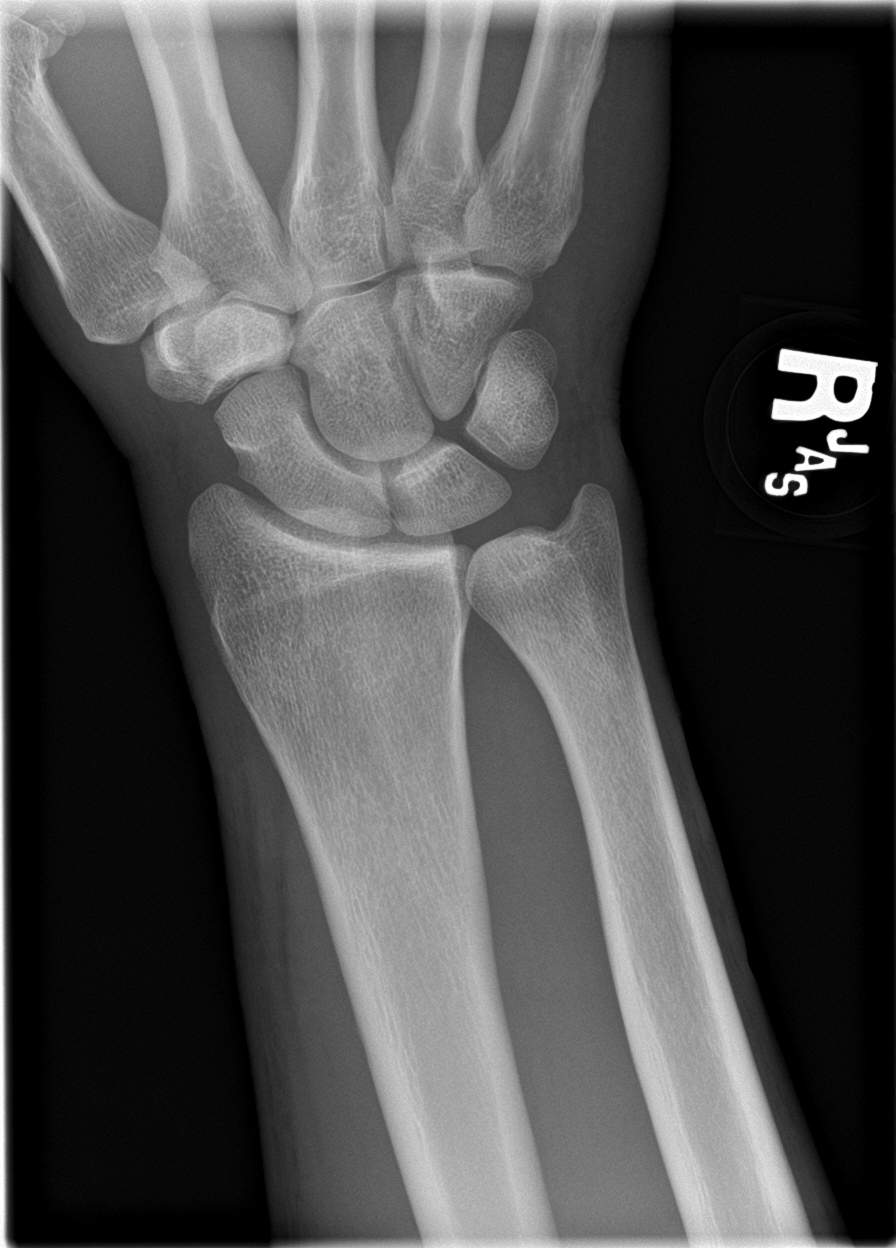

[wrist obl]
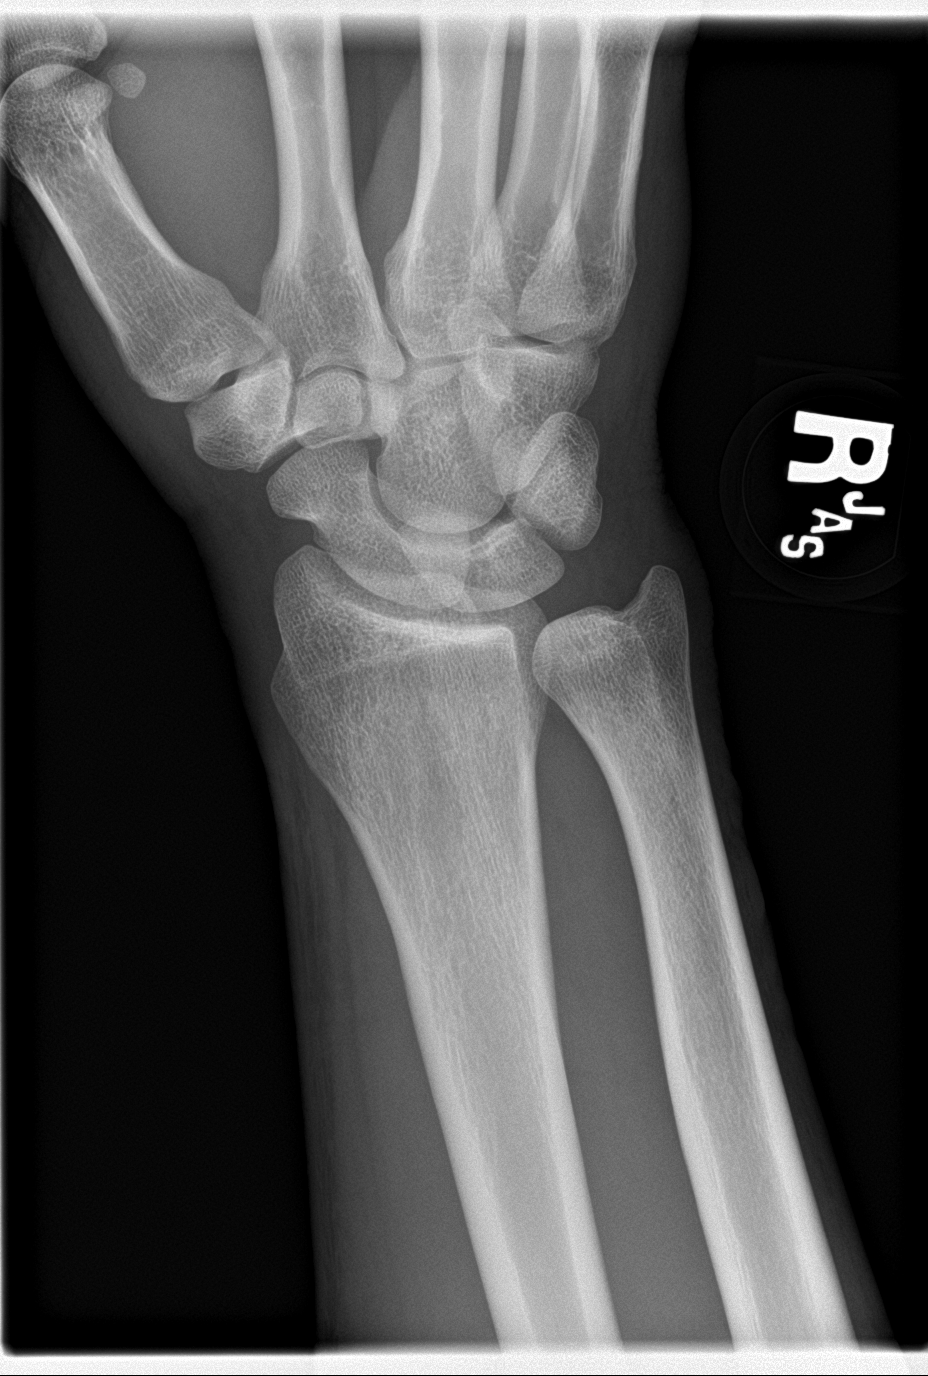

[wrist lat]
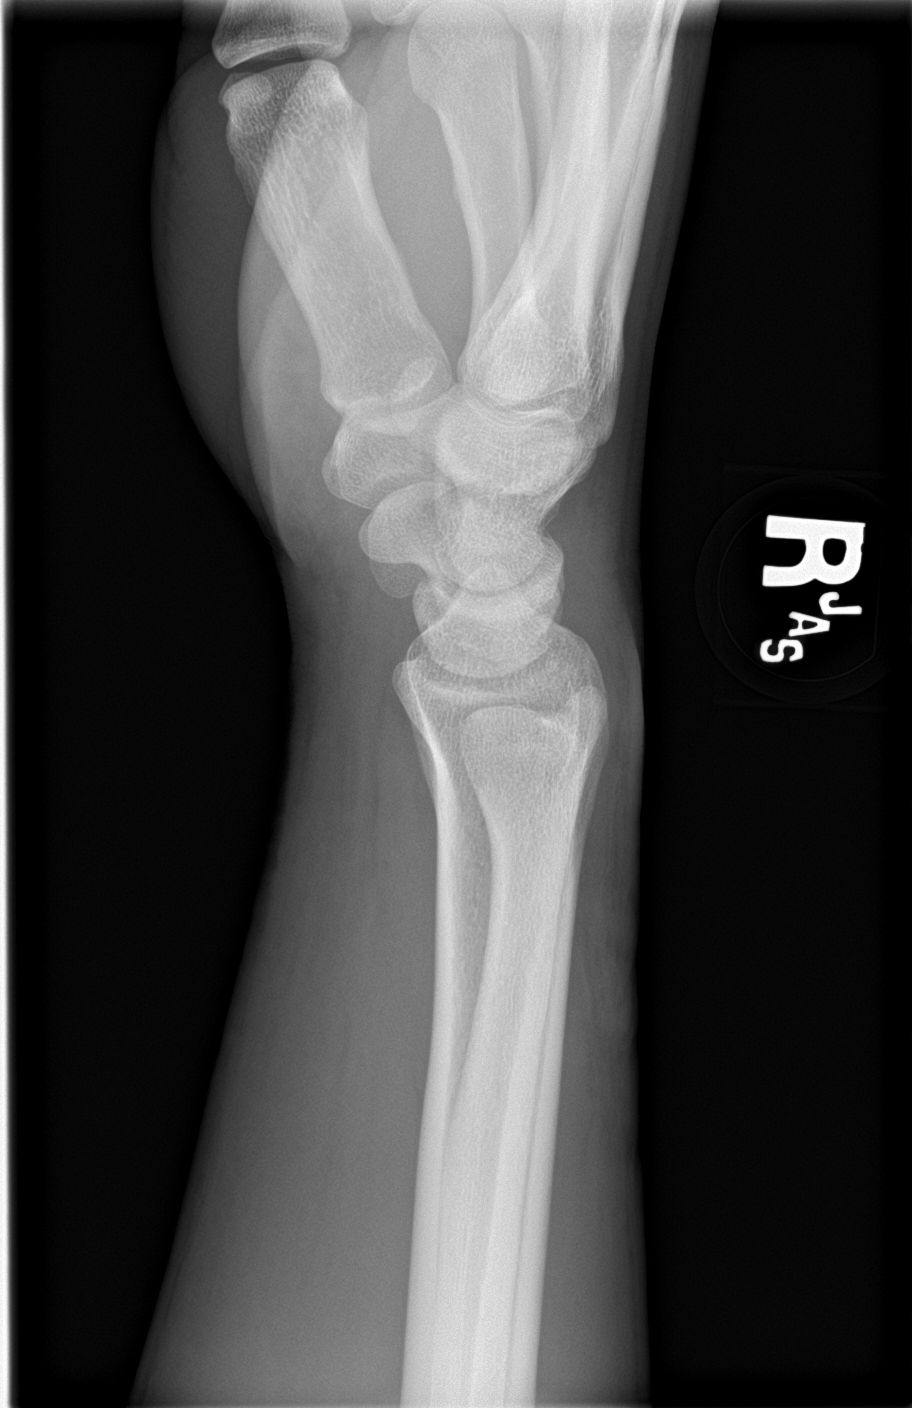

[wrist navicular]
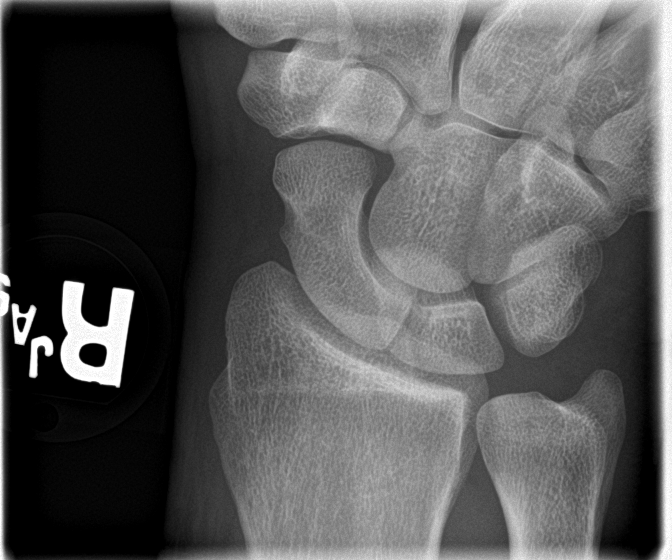

[4 of 4 positions shown; findings below may reference images not displayed]

FINDINGS: There is no evidence of fracture or dislocation. There is no
evidence of arthropathy or other focal bone abnormality.
IMPRESSION: No acute osseous abnormality

## 2017-05-19 ENCOUNTER — Encounter (HOSPITAL_COMMUNITY): Payer: Self-pay | Admitting: Emergency Medicine

## 2017-05-19 ENCOUNTER — Emergency Department (HOSPITAL_COMMUNITY)
Admission: EM | Admit: 2017-05-19 | Discharge: 2017-05-19 | Disposition: A | Discharge status: Home / Self Care | Payer: Medicaid Other | Attending: Emergency Medicine | Admitting: Emergency Medicine

## 2017-05-19 ENCOUNTER — Other Ambulatory Visit: Payer: Self-pay

## 2017-05-19 DIAGNOSIS — F250 Schizoaffective disorder, bipolar type: Secondary | ICD-10-CM | POA: Diagnosis not present

## 2017-05-19 DIAGNOSIS — F17210 Nicotine dependence, cigarettes, uncomplicated: Secondary | ICD-10-CM | POA: Diagnosis not present

## 2017-05-19 DIAGNOSIS — I10 Essential (primary) hypertension: Secondary | ICD-10-CM | POA: Diagnosis not present

## 2017-05-19 DIAGNOSIS — Z760 Encounter for issue of repeat prescription: Secondary | ICD-10-CM

## 2017-05-19 DIAGNOSIS — Z79899 Other long term (current) drug therapy: Secondary | ICD-10-CM | POA: Diagnosis not present

## 2017-05-19 DIAGNOSIS — R51 Headache: Secondary | ICD-10-CM | POA: Insufficient documentation

## 2017-05-19 DIAGNOSIS — J45909 Unspecified asthma, uncomplicated: Secondary | ICD-10-CM | POA: Diagnosis not present

## 2017-05-19 MED ORDER — OXCARBAZEPINE 600 MG PO TABS: 600.0000 mg | ORAL_TABLET | Freq: Two times a day (BID) | ORAL | 0 refills | Status: DC

## 2017-05-19 MED ORDER — LITHIUM CARBONATE 300 MG PO CAPS
300.0000 mg | ORAL_CAPSULE | Freq: Once | ORAL | Status: AC
  Administered 2017-05-19: 300 mg via ORAL
  Filled 2017-05-19: qty 1

## 2017-05-19 MED ORDER — CHLORPROMAZINE HCL 100 MG PO TABS: ORAL_TABLET | ORAL | 0 refills | Status: DC

## 2017-05-19 MED ORDER — BENZTROPINE MESYLATE 1 MG PO TABS
1.0000 mg | ORAL_TABLET | Freq: Once | ORAL | Status: AC
Start: 2017-05-19 — End: 2017-05-19
  Administered 2017-05-19: 1 mg via ORAL
  Filled 2017-05-19: qty 1

## 2017-05-19 MED ORDER — OXCARBAZEPINE 300 MG PO TABS
600.0000 mg | ORAL_TABLET | Freq: Once | ORAL | Status: AC
  Administered 2017-05-19: 600 mg via ORAL
  Filled 2017-05-19: qty 2

## 2017-05-19 MED ORDER — CHLORPROMAZINE HCL 25 MG PO TABS
100.0000 mg | ORAL_TABLET | Freq: Once | ORAL | Status: AC
  Administered 2017-05-19: 100 mg via ORAL
  Filled 2017-05-19: qty 4

## 2017-05-19 MED ORDER — HALOPERIDOL 5 MG PO TABS
10.0000 mg | ORAL_TABLET | Freq: Once | ORAL | Status: AC
Start: 2017-05-19 — End: 2017-05-19
  Administered 2017-05-19: 10 mg via ORAL
  Filled 2017-05-19: qty 2

## 2017-05-19 MED ORDER — BENZTROPINE MESYLATE 1 MG PO TABS: 1.0000 mg | ORAL_TABLET | Freq: Two times a day (BID) | ORAL | 0 refills | Status: DC

## 2017-05-19 MED ORDER — LITHIUM CARBONATE 300 MG PO TABS: 300.0000 mg | ORAL_TABLET | Freq: Three times a day (TID) | ORAL | 0 refills | Status: DC

## 2017-05-19 MED ORDER — HALOPERIDOL 2 MG PO TABS: 2.0000 mg | ORAL_TABLET | Freq: Two times a day (BID) | ORAL | 0 refills | Status: DC

## 2017-05-19 NOTE — Discharge Instructions (Signed)
Take the prescriptions as directed.  Call your regular medical doctor and your mental health provider on Monday to schedule a follow up appointment within the next 2 to 3 days.  Return to the Emergency Department immediately sooner if worsening.

## 2017-05-19 NOTE — ED Triage Notes (Signed)
Patient is complaining he has not had his medications for four weeks. He has had a haldol shot two days ago.

## 2017-05-19 NOTE — ED Provider Notes (Signed)
[facility]-EMERGENCY DEPT Provider Note   CSN: [id number] Arrival date & time: 05/19/17  4098     History   Chief Complaint Chief Complaint  Patient presents with  . Medication Refill  . Headache    HPI [name] [name] is a 41 y.o. male.  HPI  Pt was seen at 0710. Per pt, c/o gradual onset and persistence of constant "not having my meds" for the past 4 weeks. Pt states he is here for his psych meds prescriptions refill. Pt endorses he did receive his routine IM haldol medication 2 weeks ago, and is due for another injection in 2 weeks. Denies any other complaints. Denies SI/HI/AVH.    Past Medical History:  Diagnosis Date  . Aggression   . Alcohol abuse   . Asthma   . Homeless   . Hypertension   . Schizophrenia [facility])     Patient Active Problem List   Diagnosis Date Noted  . Cocaine abuse (HCC) 10/04/2015  . Suicidal ideation 09/21/2015  . Cannabis use disorder, severe, dependence (HCC) 06/18/2015  . Behavior disturbance   . Schizoaffective disorder, bipolar type (HCC) 04/28/2015  . Schizophrenia (HCC) 10/10/2014    Past Surgical History:  Procedure Laterality Date  . KNEE SURGERY    . MOUTH SURGERY          Home Medications    Prior to Admission medications   Medication Sig Start Date End Date Taking? Authorizing Provider  haloperidol decanoate (HALDOL DECANOATE) 100 MG/ML injection Inject 150 mg into the muscle every 28 (twenty-eight) days.    Yes [provider]  albuterol (PROVENTIL HFA;VENTOLIN HFA) 108 (90 Base) MCG/ACT inhaler Inhale 2 puffs into the lungs every 6 (six) hours as needed for wheezing or shortness of breath.    [provider]  benztropine (COGENTIN) 1 MG tablet Take 1 tablet (1 mg total) by mouth 2 (two) times daily. 02/18/16   Rolland Porter, MD  chlorproMAZINE (THORAZINE) 100 MG tablet 100 mg every morning. 300 Mg every evening 02/18/16   Rolland Porter, MD  diphenhydrAMINE (BENADRYL) 25 MG tablet  Take 1 tablet (25 mg total) by mouth every 6 (six) hours. 09/28/15   Law, Waylan Boga, PA-C  haloperidol (HALDOL) 10 MG tablet Take 1 tablet (10 mg total) by mouth 2 (two) times daily. 02/18/16   Rolland Porter, MD  hydrochlorothiazide (MICROZIDE) 12.5 MG capsule Take 1 capsule (12.5 mg total) by mouth daily. 09/11/15   Kristeen Mans, NP  hydrocortisone cream 1 % Apply to affected area 2 times daily Patient taking differently: Apply 1 application topically 2 (two) times daily. Apply to affected area 2 times daily 09/28/15   Law, Waylan Boga, PA-C  lithium 300 MG tablet Take 1 tablet (300 mg total) by mouth 3 (three) times daily. 02/18/16   Rolland Porter, MD  oxcarbazepine (TRILEPTAL) 600 MG tablet Take 1 tablet (600 mg total) by mouth 2 (two) times daily. 02/18/16   Rolland Porter, MD  traZODone (DESYREL) 100 MG tablet Take 1 tablet (100 mg total) by mouth at bedtime. 09/11/15   Kristeen Mans, NP  ziprasidone (GEODON) 60 MG capsule Take 1 capsule (60 mg total) by mouth 2 (two) times daily with a meal. 09/11/15   Kristeen Mans, NP    Family History History reviewed. No pertinent family history.  Social History Social History   Tobacco Use  . Smoking status: Current Every Day Smoker  . Smokeless tobacco: Never Used  Substance Use Topics  .  Alcohol use: Yes  . Drug use: Yes    Types: Cocaine, Marijuana, Hydrocodone, MDMA (Ecstacy)    Comment: former     Allergies   Sudafed [pseudoephedrine] and Food   Review of Systems Review of Systems ROS: Statement: All systems negative except as marked or noted in the HPI; Constitutional: Negative for fever and chills. ; ; Eyes: Negative for eye pain, redness and discharge. ; ; ENMT: Negative for ear pain, hoarseness, nasal congestion, sinus pressure and sore throat. ; ; Cardiovascular: Negative for chest pain, palpitations, diaphoresis, dyspnea and peripheral edema. ; ; Respiratory: Negative for cough, wheezing and stridor. ; ; Gastrointestinal: Negative for  nausea, vomiting, diarrhea, abdominal pain, blood in stool, hematemesis, jaundice and rectal bleeding. . ; ; Genitourinary: Negative for dysuria, flank pain and hematuria. ; ; Musculoskeletal: Negative for back pain and neck pain. Negative for swelling and trauma.; ; Skin: Negative for pruritus, rash, abrasions, blisters, bruising and skin lesion.; ; Neuro: Negative for headache, lightheadedness and neck stiffness. Negative for weakness, altered level of consciousness, altered mental status, extremity weakness, paresthesias, involuntary movement, seizure and syncope.; Psych:  No SI, no SA, no HI, no hallucinations.      Physical Exam Updated Vital Signs BP 102/66 (BP Location: Right Arm)   Pulse 81   Temp 97.7 F (36.5 C) (Oral)   Resp 17   Ht 5\' 10"  (1.778 m)   Wt 94.8 kg (209 lb)   SpO2 99%   BMI 29.99 kg/m   Physical Exam 0715: Physical examination:  Nursing notes reviewed; Vital signs and O2 SAT reviewed;  Constitutional: Well developed, Well nourished, Well hydrated, In no acute distress; Head:  Normocephalic, atraumatic; Eyes: EOMI, PERRL, No scleral icterus; ENMT: Mouth and pharynx normal, Mucous membranes moist; Neck: Supple, Full range of motion; Cardiovascular: Regular rate and rhythm; Respiratory: Breath sounds clear, No wheezes.  Speaking full sentences with ease, Normal respiratory effort/excursion; Chest: No deformity, Movement normal; Abdomen: Nondistended; Extremities: No deformity.; Neuro: AA&Ox3, Major CN grossly intact.  Speech clear. No gross focal motor deficits in extremities. Climbs on and off stretcher easily by himself. Gait steady.; Skin: Color normal, Warm, Dry.; Psych:  Currently calm/cooperative with multiple Police escorts.    ED Treatments / Results  Labs (all labs ordered are listed, but only abnormal results are displayed)   EKG None  Radiology   Procedures Procedures (including critical care time)  Medications Ordered in ED Medications - No data  to display   Initial Impression / Assessment and Plan / ED Course  I have reviewed the triage vital signs and the nursing notes.  Pertinent labs & imaging results that were available during my care of the patient were reviewed by me and considered in my medical decision making (see chart for details).  MDM Reviewed: previous chart, nursing note and vitals    0830:  Pharm Tech has verified pt's meds. Will give 1st dose in ED and refill. Pt strongly encouraged to f/u with his outpt mental health provider for good continuity of care.     Final Clinical Impressions(s) / ED Diagnoses   Final diagnoses:  None    ED Discharge Orders    None       Samuel JesterMcManus, Thelbert Gartin, DO 05/22/17 1233

## 2017-07-24 ENCOUNTER — Other Ambulatory Visit: Payer: Self-pay

## 2017-07-24 ENCOUNTER — Encounter (HOSPITAL_COMMUNITY): Payer: Self-pay | Admitting: Emergency Medicine

## 2017-07-24 ENCOUNTER — Emergency Department (HOSPITAL_COMMUNITY)
Admission: EM | Admit: 2017-07-24 | Discharge: 2017-07-24 | Disposition: A | Discharge status: Home / Self Care | Payer: Medicaid Other | Attending: Emergency Medicine | Admitting: Emergency Medicine

## 2017-07-24 DIAGNOSIS — F1499 Cocaine use, unspecified with unspecified cocaine-induced disorder: Secondary | ICD-10-CM | POA: Insufficient documentation

## 2017-07-24 DIAGNOSIS — R51 Headache: Secondary | ICD-10-CM | POA: Diagnosis present

## 2017-07-24 DIAGNOSIS — I10 Essential (primary) hypertension: Secondary | ICD-10-CM | POA: Diagnosis not present

## 2017-07-24 DIAGNOSIS — F17210 Nicotine dependence, cigarettes, uncomplicated: Secondary | ICD-10-CM | POA: Insufficient documentation

## 2017-07-24 DIAGNOSIS — F1490 Cocaine use, unspecified, uncomplicated: Secondary | ICD-10-CM

## 2017-07-24 DIAGNOSIS — Z79899 Other long term (current) drug therapy: Secondary | ICD-10-CM | POA: Insufficient documentation

## 2017-07-24 DIAGNOSIS — F209 Schizophrenia, unspecified: Secondary | ICD-10-CM | POA: Diagnosis not present

## 2017-07-24 MED ORDER — OXCARBAZEPINE 300 MG PO TABS
600.0000 mg | ORAL_TABLET | Freq: Once | ORAL | Status: AC
  Administered 2017-07-24: 600 mg via ORAL
  Filled 2017-07-24: qty 2

## 2017-07-24 MED ORDER — BENZTROPINE MESYLATE 1 MG/ML IJ SOLN
1.0000 mg | Freq: Once | INTRAMUSCULAR | Status: DC
  Filled 2017-07-24: qty 1

## 2017-07-24 MED ORDER — IBUPROFEN 400 MG PO TABS
400.0000 mg | ORAL_TABLET | Freq: Once | ORAL | Status: AC | PRN
  Administered 2017-07-24: 400 mg via ORAL
  Filled 2017-07-24: qty 1

## 2017-07-24 MED ORDER — BENZTROPINE MESYLATE 1 MG PO TABS
1.0000 mg | ORAL_TABLET | Freq: Once | ORAL | Status: AC
  Administered 2017-07-24: 1 mg via ORAL
  Filled 2017-07-24: qty 1

## 2017-07-24 MED ORDER — LITHIUM CARBONATE 300 MG PO CAPS
300.0000 mg | ORAL_CAPSULE | Freq: Once | ORAL | Status: AC
  Administered 2017-07-24: 300 mg via ORAL
  Filled 2017-07-24 (×3): qty 1

## 2017-07-24 MED ORDER — HALOPERIDOL 5 MG PO TABS
10.0000 mg | ORAL_TABLET | Freq: Once | ORAL | Status: AC
  Administered 2017-07-24: 10 mg via ORAL
  Filled 2017-07-24: qty 2

## 2017-07-24 MED ORDER — CHLORPROMAZINE HCL 25 MG PO TABS
100.0000 mg | ORAL_TABLET | Freq: Once | ORAL | Status: AC
  Administered 2017-07-24: 100 mg via ORAL
  Filled 2017-07-24: qty 4

## 2017-07-24 NOTE — ED Triage Notes (Signed)
Patient presents to the ED with complaints of headache for a couple of hours. Patient reports did cocaine today and thinks that why he has a headache. Patient reports has been out of psych meds for x2 days and requesting medication. Patient denies chest pain or Shortness of breathe.

## 2017-07-24 NOTE — ED Notes (Signed)
Patient left without waiting for discharge instructions.  

## 2017-07-24 NOTE — ED Notes (Signed)
RN requested patient have a coke w/o ice.

## 2017-07-24 NOTE — ED Notes (Signed)
Patient witnessed walking into lobby

## 2017-07-24 NOTE — ED Notes (Signed)
Pt requesting PO cogentin, not IM. Pt easily directable, admits to increased anxiety while waiting for medications to come from pharmacy. Appears anxious, remains cooperative with staff

## 2017-07-24 NOTE — ED Provider Notes (Signed)
[facility]  CSN: [id number] Arrival date & time: 07/24/17 [id number]  Chief Complaint(s) Headache  HPI [name] [name] is a 42 y.o. male with a history of schizophrenia, polysubstance abuse presents to the emergency department for lightheaded and headache following cocaine use.  Patient also reported elevated blood pressures shortly after use.  States that his symptoms have now resolved.  He states he is here because he has not had his psychiatric medication in 2 days.  States that he had a falling out with his wife and the medications are at home.  Cannot get to them for a few days.  Denies any suicidal ideation, homicidal ideation, auditory/visual hallucinations.  Denies any chest pain or shortness of breath.  No nausea or vomiting.  No abdominal pain.  No focal deficits.  HPI  Past Medical History Past Medical History:  Diagnosis Date  . Aggression   . Alcohol abuse   . Asthma   . Homeless   . Hypertension   . Schizophrenia [facility](HCC)    Patient Active Problem List   Diagnosis Date Noted  . Cocaine abuse (HCC) 10/04/2015  . Suicidal ideation 09/21/2015  . Cannabis use disorder, severe, dependence (HCC) 06/18/2015  . Behavior disturbance   . Schizoaffective disorder, bipolar type (HCC) 04/28/2015  . Schizophrenia (HCC) 10/10/2014   Home Medication(s) Prior to Admission medications   Medication Sig Start Date End Date Taking? Authorizing Provider  albuterol (PROVENTIL HFA;VENTOLIN HFA) 108 (90 Base) MCG/ACT inhaler Inhale 2 puffs into the lungs every 6 (six) hours as needed for wheezing or shortness of breath.    [provider]  benztropine (COGENTIN) 1 MG tablet Take 1 tablet (1 mg total) by mouth 2 (two) times daily. 02/18/16   Rolland PorterJames, Mark, MD  benztropine (COGENTIN) 1 MG tablet Take 1 tablet (1 mg total) by mouth 2 (two) times daily. 05/19/17   Samuel JesterMcManus, Kathleen, DO  chlorproMAZINE (THORAZINE) 100 MG tablet 100 mg every  morning. 300 Mg every evening 02/18/16   Rolland PorterJames, Mark, MD  chlorproMAZINE (THORAZINE) 100 MG tablet Take 1 tablet (100 mg total) by mouth every morning AND 3 tablets (300 mg total) every evening. 05/19/17   Samuel JesterMcManus, Kathleen, DO  diphenhydrAMINE (BENADRYL) 25 MG tablet Take 1 tablet (25 mg total) by mouth every 6 (six) hours. 09/28/15   Law, Waylan BogaAlexandra M, PA-C  haloperidol (HALDOL) 10 MG tablet Take 1 tablet (10 mg total) by mouth 2 (two) times daily. 02/18/16   Rolland PorterJames, Mark, MD  haloperidol (HALDOL) 2 MG tablet Take 1 tablet (2 mg total) by mouth 2 (two) times daily. 05/19/17   Samuel JesterMcManus, Kathleen, DO  haloperidol decanoate (HALDOL DECANOATE) 100 MG/ML injection Inject 150 mg into the muscle every 28 (twenty-eight) days.     [provider]  hydrochlorothiazide (MICROZIDE) 12.5 MG capsule Take 1 capsule (12.5 mg total) by mouth daily. 09/11/15   Kristeen MansHobson, Fran E, NP  hydrocortisone cream 1 % Apply to affected area 2 times daily Patient taking differently: Apply 1 application topically 2 (two) times daily. Apply to affected area 2 times daily 09/28/15   Law, Waylan BogaAlexandra M, PA-C  lithium 300 MG tablet Take 1 tablet (300 mg total) by mouth 3 (three) times daily. 02/18/16   Rolland PorterJames, Mark, MD  lithium 300 MG tablet Take 1 tablet (300 mg total) by mouth 3 (three) times daily. 05/19/17   Samuel JesterMcManus, Kathleen, DO  oxcarbazepine (TRILEPTAL) 600 MG tablet Take 1 tablet (600 mg total) by mouth 2 (two) times  daily. 02/18/16   Rolland Porter, MD  oxcarbazepine (TRILEPTAL) 600 MG tablet Take 1 tablet (600 mg total) by mouth 2 (two) times daily. 05/19/17   Samuel Jester, DO  traZODone (DESYREL) 100 MG tablet Take 1 tablet (100 mg total) by mouth at bedtime. 09/11/15   Kristeen Mans, NP  ziprasidone (GEODON) 60 MG capsule Take 1 capsule (60 mg total) by mouth 2 (two) times daily with a meal. 09/11/15   Kristeen Mans, NP                                                                                                                                     Past Surgical History Past Surgical History:  Procedure Laterality Date  . KNEE SURGERY    . MOUTH SURGERY     Family History No family history on file.  Social History Social History   Tobacco Use  . Smoking status: Current Every Day Smoker  . Smokeless tobacco: Never Used  Substance Use Topics  . Alcohol use: Yes  . Drug use: Yes    Types: Cocaine, Marijuana, Hydrocodone, MDMA (Ecstacy)    Comment: former   Allergies Pseudoephedrine hcl; Sudafed [pseudoephedrine]; and Food  Review of Systems Review of Systems All other systems are reviewed and are negative for acute change except as noted in the HPI  Physical Exam Vital Signs  I have reviewed the triage vital signs BP 140/86 (BP Location: Right Arm)   Pulse 69   Temp 99 F (37.2 C) (Oral)   Resp 16   SpO2 100%   Physical Exam  Constitutional: He is oriented to person, place, and time. He appears well-developed and well-nourished. No distress.  HENT:  Head: Normocephalic and atraumatic.  Right Ear: External ear normal.  Left Ear: External ear normal.  Nose: Nose normal.  Mouth/Throat: Mucous membranes are normal. No trismus in the jaw.  Eyes: Conjunctivae and EOM are normal. No scleral icterus.  Neck: Normal range of motion and phonation normal.  Cardiovascular: Normal rate and regular rhythm.  Pulmonary/Chest: Effort normal. No stridor. No respiratory distress.  Abdominal: He exhibits no distension.  Musculoskeletal: Normal range of motion. He exhibits no edema.  Neurological: He is alert and oriented to person, place, and time. He has normal strength. No sensory deficit. Gait normal.  Skin: He is not diaphoretic.  Psychiatric: He has a normal mood and affect. His behavior is normal.  Vitals reviewed.   ED Results and Treatments Labs (all labs ordered are listed, but only abnormal results are displayed) Labs Reviewed - No data to display  EKG  EKG Interpretation  Date/Time:    Ventricular Rate:    PR Interval:    QRS Duration:   QT Interval:    QTC Calculation:   R Axis:     Text Interpretation:        Radiology No results found. Pertinent labs & imaging results that were available during my care of the patient were reviewed by me and considered in my medical decision making (see chart for details).  Medications Ordered in ED Medications  ibuprofen (ADVIL,MOTRIN) tablet 400 mg (400 mg Oral Given 07/24/17 0334)  chlorproMAZINE (THORAZINE) tablet 100 mg (100 mg Oral Given 07/24/17 0410)  haloperidol (HALDOL) tablet 10 mg (10 mg Oral Given 07/24/17 0409)  lithium carbonate capsule 300 mg (300 mg Oral Given 07/24/17 0452)  Oxcarbazepine (TRILEPTAL) tablet 600 mg (600 mg Oral Given 07/24/17 0410)  benztropine (COGENTIN) tablet 1 mg (1 mg Oral Given 07/24/17 0451)                                                                                                                                    Procedures Procedures  (including critical care time)  Medical Decision Making / ED Course I have reviewed the nursing notes for this encounter and the patient's prior records (if available in EHR or on provided paperwork).    Provided with 1 dose of his home medication.  Encouraged him to follow-up with psychiatry for continued management.  The patient is safe for discharge with strict return precautions.   Final Clinical Impression(s) / ED Diagnoses Final diagnoses:  Cocaine use  Schizophrenia, unspecified type (HCC)   Disposition: Discharge  Condition: Good  I have discussed the results, Dx and Tx plan with the patient who expressed understanding and agree(s) with the plan. Discharge instructions discussed at great length. The patient was given strict return precautions who verbalized understanding of the instructions. No further questions at time of  discharge.    ED Discharge Orders    None       Follow Up: Pa, [facility] [address] [place] [address] [phone]         This chart was dictated using voice recognition software.  Despite best efforts to proofread,  errors can occur which can change the documentation meaning.   Nira Conn, MD 07/24/17 [phone]

## 2017-12-02 ENCOUNTER — Emergency Department (HOSPITAL_COMMUNITY)
Admission: EM | Admit: 2017-12-02 | Discharge: 2017-12-02 | Disposition: A | Discharge status: Home / Self Care | Payer: Medicaid Other | Attending: Emergency Medicine | Admitting: Emergency Medicine

## 2017-12-02 ENCOUNTER — Other Ambulatory Visit: Payer: Self-pay

## 2017-12-02 ENCOUNTER — Encounter (HOSPITAL_COMMUNITY): Payer: Self-pay

## 2017-12-02 DIAGNOSIS — Z79899 Other long term (current) drug therapy: Secondary | ICD-10-CM | POA: Diagnosis not present

## 2017-12-02 DIAGNOSIS — Z23 Encounter for immunization: Secondary | ICD-10-CM | POA: Insufficient documentation

## 2017-12-02 DIAGNOSIS — F1220 Cannabis dependence, uncomplicated: Secondary | ICD-10-CM | POA: Diagnosis not present

## 2017-12-02 DIAGNOSIS — F209 Schizophrenia, unspecified: Secondary | ICD-10-CM | POA: Diagnosis not present

## 2017-12-02 DIAGNOSIS — Y9389 Activity, other specified: Secondary | ICD-10-CM | POA: Diagnosis not present

## 2017-12-02 DIAGNOSIS — F17200 Nicotine dependence, unspecified, uncomplicated: Secondary | ICD-10-CM | POA: Diagnosis not present

## 2017-12-02 DIAGNOSIS — F1010 Alcohol abuse, uncomplicated: Secondary | ICD-10-CM | POA: Insufficient documentation

## 2017-12-02 DIAGNOSIS — F1410 Cocaine abuse, uncomplicated: Secondary | ICD-10-CM | POA: Insufficient documentation

## 2017-12-02 DIAGNOSIS — I10 Essential (primary) hypertension: Secondary | ICD-10-CM | POA: Insufficient documentation

## 2017-12-02 DIAGNOSIS — Y929 Unspecified place or not applicable: Secondary | ICD-10-CM | POA: Insufficient documentation

## 2017-12-02 DIAGNOSIS — Z590 Homelessness: Secondary | ICD-10-CM | POA: Insufficient documentation

## 2017-12-02 DIAGNOSIS — W540XXA Bitten by dog, initial encounter: Secondary | ICD-10-CM | POA: Insufficient documentation

## 2017-12-02 DIAGNOSIS — J45909 Unspecified asthma, uncomplicated: Secondary | ICD-10-CM | POA: Insufficient documentation

## 2017-12-02 DIAGNOSIS — Y998 Other external cause status: Secondary | ICD-10-CM | POA: Diagnosis not present

## 2017-12-02 DIAGNOSIS — S80871A Other superficial bite, right lower leg, initial encounter: Secondary | ICD-10-CM | POA: Insufficient documentation

## 2017-12-02 MED ORDER — AMOXICILLIN-POT CLAVULANATE 875-125 MG PO TABS: 1.0000 | ORAL_TABLET | Freq: Two times a day (BID) | ORAL | 0 refills | Status: AC

## 2017-12-02 MED ORDER — TETANUS-DIPHTH-ACELL PERTUSSIS 5-2.5-18.5 LF-MCG/0.5 IM SUSP
0.5000 mL | Freq: Once | INTRAMUSCULAR | Status: AC
  Administered 2017-12-02: 0.5 mL via INTRAMUSCULAR
  Filled 2017-12-02: qty 0.5
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
## 2017-12-02 NOTE — ED Notes (Signed)
Patient reports Animal Control was notified at time of bite.
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
## 2017-12-02 NOTE — Discharge Instructions (Addendum)
Take antibiotic twice a day  Come back if you have any new or worsening symptoms like fever, redness or swelling around the wound on your leg.

## 2018-01-22 ENCOUNTER — Encounter: Payer: Self-pay | Admitting: Sports Medicine

## 2018-01-22 ENCOUNTER — Ambulatory Visit: Payer: Medicaid Other | Admitting: Sports Medicine

## 2018-01-22 ENCOUNTER — Other Ambulatory Visit: Payer: Self-pay

## 2018-01-22 VITALS — BP 141/98 | HR 80

## 2018-01-22 DIAGNOSIS — B351 Tinea unguium: Secondary | ICD-10-CM

## 2018-01-22 DIAGNOSIS — M79676 Pain in unspecified toe(s): Secondary | ICD-10-CM | POA: Diagnosis not present

## 2018-01-22 DIAGNOSIS — M79609 Pain in unspecified limb: Principal | ICD-10-CM

## 2018-01-22 MED ORDER — CLOTRIMAZOLE 1 % EX SOLN: 1.0000 "application " | Freq: Two times a day (BID) | CUTANEOUS | 5 refills | Status: DC

## 2018-01-22 NOTE — Progress Notes (Signed)
Subjective: [name] [name] is a 42 y.o. male patient seen today in office with complaint of mildly painful thickened and elongated toenails; unable to trim especially big toes. Patient denies history of Diabetes, Neuropathy, or Vascular disease. Patient has no other pedal complaints at this time.   Patient Active Problem List   Diagnosis Date Noted  . Cocaine abuse (HCC) 10/04/2015  . Suicidal ideation 09/21/2015  . Cannabis use disorder, severe, dependence (HCC) 06/18/2015  . Behavior disturbance   . Schizoaffective disorder, bipolar type (HCC) 04/28/2015  . Adult antisocial behavior 04/04/2015  . Polysubstance abuse (HCC) 04/04/2015  . Homicidal ideation 10/16/2014  . Obstructive sleep apnea 10/16/2014  . Schizophrenia (HCC) 10/10/2014    Current Outpatient Medications on File Prior to Visit  Medication Sig Dispense Refill  . albuterol (PROVENTIL HFA;VENTOLIN HFA) 108 (90 Base) MCG/ACT inhaler Inhale 2 puffs into the lungs every 6 (six) hours as needed for wheezing or shortness of breath.    . benztropine (COGENTIN) 1 MG tablet Take 1 tablet (1 mg total) by mouth 2 (two) times daily. 60 tablet 0  . benztropine (COGENTIN) 1 MG tablet Take 1 tablet (1 mg total) by mouth 2 (two) times daily. 20 tablet 0  . chlorproMAZINE (THORAZINE) 100 MG tablet 100 mg every morning. 300 Mg every evening 120 tablet 0  . chlorproMAZINE (THORAZINE) 100 MG tablet Take 1 tablet (100 mg total) by mouth every morning AND 3 tablets (300 mg total) every evening. 38 tablet 0  . diphenhydrAMINE (BENADRYL) 25 MG tablet Take 1 tablet (25 mg total) by mouth every 6 (six) hours. 20 tablet 0  . haloperidol (HALDOL) 10 MG tablet Take 1 tablet (10 mg total) by mouth 2 (two) times daily. 60 tablet 0  . haloperidol (HALDOL) 2 MG tablet Take 1 tablet (2 mg total) by mouth 2 (two) times daily. 20 tablet 0  . haloperidol decanoate (HALDOL DECANOATE) 100 MG/ML injection Inject 150 mg into the muscle every 28 (twenty-eight)  days.     . hydrochlorothiazide (MICROZIDE) 12.5 MG capsule Take 1 capsule (12.5 mg total) by mouth daily. 30 capsule 0  . hydrocortisone cream 1 % Apply to affected area 2 times daily (Patient taking differently: Apply 1 application topically 2 (two) times daily. Apply to affected area 2 times daily) 20 g 0  . lithium 300 MG tablet Take 1 tablet (300 mg total) by mouth 3 (three) times daily. 60 tablet 0  . lithium 300 MG tablet Take 1 tablet (300 mg total) by mouth 3 (three) times daily. 30 tablet 0  . oxcarbazepine (TRILEPTAL) 600 MG tablet Take 1 tablet (600 mg total) by mouth 2 (two) times daily. 60 tablet 0  . oxcarbazepine (TRILEPTAL) 600 MG tablet Take 1 tablet (600 mg total) by mouth 2 (two) times daily. 20 tablet 0  . traZODone (DESYREL) 100 MG tablet Take 1 tablet (100 mg total) by mouth at bedtime. 30 tablet 0  . ziprasidone (GEODON) 60 MG capsule Take 1 capsule (60 mg total) by mouth 2 (two) times daily with a meal. 60 capsule 0   No current facility-administered medications on file prior to visit.     Allergies  Allergen Reactions  . Prunus Persica Swelling  . Pseudoephedrine Hcl Swelling  . Sudafed [Pseudoephedrine] Swelling and Rash  . Food Swelling and Other (See Comments)    Pt states that he is allergic to peaches.    Objective: Physical Exam  General: Well developed, nourished, no acute distress, awake, alert and  oriented x 3  Vascular: Dorsalis pedis artery 2/4 bilateral, Posterior tibial artery 2/4 bilateral, skin temperature warm to warm proximal to distal bilateral lower extremities, no varicosities, pedal hair present bilateral.  Neurological: Gross sensation present via light touch bilateral.   Dermatological: Skin is warm, dry, and supple bilateral, Nails 1-10 are tender, long, thick, and discolored with mild subungal debris especially bilateral hallux, no webspace macerations present bilateral, no open lesions present bilateral, no callus/corns/hyperkeratotic  tissue present bilateral. No signs of infection bilateral.  Musculoskeletal: No symptomatic boney deformities noted bilateral. Muscular strength within normal limits without painon range of motion. No pain with calf compression bilateral.  Assessment and Plan:  Problem List Items Addressed This Visit    None    Visit Diagnoses    Pain due to onychomycosis of nail    -  Primary      -Examined patient.  -Discussed treatment options for painful mycotic nails. -Mechanically debrided and reduced mycotic nails with sterile nail nipper and dremel nail file without incident. -Rx Clotrimazole to use to nail daily after filing  -Recommend shoes of appropriate size  -Patient to return in 3 months for follow up evaluation or sooner if symptoms worsen.  Asencion Islam, DPM

## 2018-02-06 ENCOUNTER — Encounter (HOSPITAL_COMMUNITY): Payer: Self-pay

## 2018-02-06 ENCOUNTER — Emergency Department (HOSPITAL_COMMUNITY)
Admission: EM | Admit: 2018-02-06 | Discharge: 2018-02-06 | Disposition: A | Discharge status: Home / Self Care | Payer: Medicaid Other | Attending: Emergency Medicine | Admitting: Emergency Medicine

## 2018-02-06 ENCOUNTER — Other Ambulatory Visit: Payer: Self-pay

## 2018-02-06 DIAGNOSIS — Z79899 Other long term (current) drug therapy: Secondary | ICD-10-CM | POA: Insufficient documentation

## 2018-02-06 DIAGNOSIS — F1910 Other psychoactive substance abuse, uncomplicated: Secondary | ICD-10-CM | POA: Diagnosis not present

## 2018-02-06 DIAGNOSIS — F209 Schizophrenia, unspecified: Secondary | ICD-10-CM | POA: Diagnosis not present

## 2018-02-06 DIAGNOSIS — F29 Unspecified psychosis not due to a substance or known physiological condition: Secondary | ICD-10-CM | POA: Diagnosis present

## 2018-02-06 DIAGNOSIS — J45909 Unspecified asthma, uncomplicated: Secondary | ICD-10-CM | POA: Diagnosis not present

## 2018-02-06 DIAGNOSIS — F200 Paranoid schizophrenia: Secondary | ICD-10-CM

## 2018-02-06 DIAGNOSIS — I10 Essential (primary) hypertension: Secondary | ICD-10-CM | POA: Insufficient documentation

## 2018-02-06 DIAGNOSIS — F17200 Nicotine dependence, unspecified, uncomplicated: Secondary | ICD-10-CM | POA: Insufficient documentation

## 2018-02-06 DIAGNOSIS — R4689 Other symptoms and signs involving appearance and behavior: Secondary | ICD-10-CM

## 2018-02-06 LAB — CBC WITH DIFFERENTIAL/PLATELET
Abs Immature Granulocytes: 0.02 10*3/uL (ref 0.00–0.07)
Basophils Absolute: 0 10*3/uL (ref 0.0–0.1)
Basophils Relative: 0 %
Eosinophils Absolute: 0 10*3/uL (ref 0.0–0.5)
Eosinophils Relative: 1 %
HCT: 50.8 % (ref 39.0–52.0)
Hemoglobin: 16 g/dL (ref 13.0–17.0)
Immature Granulocytes: 0 %
Lymphocytes Relative: 15 %
Lymphs Abs: 0.9 10*3/uL (ref 0.7–4.0)
MCH: 30.7 pg (ref 26.0–34.0)
MCHC: 31.5 g/dL (ref 30.0–36.0)
MCV: 97.5 fL (ref 80.0–100.0)
Monocytes Absolute: 0.5 10*3/uL (ref 0.1–1.0)
Monocytes Relative: 8 %
Neutro Abs: 4.5 10*3/uL (ref 1.7–7.7)
Neutrophils Relative %: 76 %
Platelets: 206 10*3/uL (ref 150–400)
RBC: 5.21 MIL/uL (ref 4.22–5.81)
RDW: 12.2 % (ref 11.5–15.5)
WBC: 6 10*3/uL (ref 4.0–10.5)
nRBC: 0 % (ref 0.0–0.2)

## 2018-02-06 LAB — COMPREHENSIVE METABOLIC PANEL
ALT: 20 U/L (ref 0–44)
AST: 26 U/L (ref 15–41)
Albumin: 4.5 g/dL (ref 3.5–5.0)
Alkaline Phosphatase: 69 U/L (ref 38–126)
Anion gap: 10 (ref 5–15)
BUN: 9 mg/dL (ref 6–20)
CO2: 25 mmol/L (ref 22–32)
Calcium: 9.9 mg/dL (ref 8.9–10.3)
Chloride: 106 mmol/L (ref 98–111)
Creatinine, Ser: 1.08 mg/dL (ref 0.61–1.24)
GFR calc Af Amer: >60 mL/min (ref >60)
GFR calc non Af Amer: >60 mL/min (ref >60)
Glucose, Bld: 129 mg/dL — ABNORMAL HIGH (ref 70–99)
Potassium: 3.7 mmol/L (ref 3.5–5.1)
Sodium: 141 mmol/L (ref 135–145)
Total Bilirubin: 0.5 mg/dL (ref 0.3–1.2)
Total Protein: 7.4 g/dL (ref 6.5–8.1)

## 2018-02-06 LAB — ETHANOL: Alcohol, Ethyl (B): <10 mg/dL (ref <10)

## 2018-02-06 MED ORDER — HALOPERIDOL LACTATE 5 MG/ML IJ SOLN
5.0000 mg | Freq: Once | INTRAMUSCULAR | Status: AC
  Administered 2018-02-06: 5 mg via INTRAMUSCULAR
  Filled 2018-02-06: qty 1

## 2018-02-06 MED ORDER — OLANZAPINE 10 MG PO TBDP: 10.0000 mg | ORAL_TABLET | Freq: Three times a day (TID) | ORAL | Status: DC | PRN

## 2018-02-06 MED ORDER — ACETAMINOPHEN 325 MG PO TABS: 650.0000 mg | ORAL_TABLET | ORAL | Status: DC | PRN

## 2018-02-06 MED ORDER — ALUM & MAG HYDROXIDE-SIMETH 200-200-20 MG/5ML PO SUSP: 30.0000 mL | Freq: Four times a day (QID) | ORAL | Status: DC | PRN

## 2018-02-06 MED ORDER — LORAZEPAM 2 MG/ML IJ SOLN
2.0000 mg | Freq: Once | INTRAMUSCULAR | Status: DC
  Filled 2018-02-06: qty 1

## 2018-02-06 MED ORDER — DIPHENHYDRAMINE HCL 50 MG/ML IJ SOLN
25.0000 mg | Freq: Once | INTRAMUSCULAR | Status: AC
  Administered 2018-02-06: 25 mg via INTRAMUSCULAR
  Filled 2018-02-06: qty 1

## 2018-02-06 MED ORDER — DIPHENHYDRAMINE HCL 50 MG/ML IJ SOLN
25.0000 mg | Freq: Once | INTRAMUSCULAR | Status: DC
  Filled 2018-02-06: qty 1

## 2018-02-06 MED ORDER — DIPHENHYDRAMINE HCL 50 MG/ML IJ SOLN: 25.0000 mg | Freq: Once | INTRAMUSCULAR | Status: DC

## 2018-02-06 MED ORDER — ZIPRASIDONE MESYLATE 20 MG IM SOLR: 20.0000 mg | INTRAMUSCULAR | Status: DC | PRN

## 2018-02-06 MED ORDER — ZIPRASIDONE MESYLATE 20 MG IM SOLR
20.0000 mg | Freq: Once | INTRAMUSCULAR | Status: AC
  Administered 2018-02-06: 20 mg via INTRAMUSCULAR
  Filled 2018-02-06: qty 20

## 2018-02-06 MED ORDER — LORAZEPAM 1 MG PO TABS: 1.0000 mg | ORAL_TABLET | ORAL | Status: DC | PRN

## 2018-02-06 NOTE — ED Provider Notes (Signed)
[facility]-EMERGENCY DEPT Provider Note   CSN: [id number] Arrival date & time: 02/06/18  1246     History   Chief Complaint Chief Complaint  Patient presents with  . Mental Health Problem    HPI [name] [name] is a 42 y.o. male.  HPI  42 year old male with history of schizophrenia, polysubstance abuse, here with agitation.  The patient reportedly called police and told them that he was going to kill himself.  The patient has an extensive history of previous admissions and aggression in the setting of his schizophrenia.  He states that he is due for a Haldol shot today, but has essentially been binging on cocaine in order to stay awake to work nights.  He has had progressively worsening auditory hallucinations which are voices telling him to hurt himself and other people.  He states that he could not handle them, so he called 911, he was not able to take him to his appointment.  He says we decided that in order to get help, he would need to call police and tell him he was going to kill himself.  He is agitated and aggressive on interview.  Past Medical History:  Diagnosis Date  . Aggression   . Alcohol abuse   . Asthma   . Homeless   . Hypertension   . Schizophrenia [facility])     Patient Active Problem List   Diagnosis Date Noted  . Cocaine abuse (HCC) 10/04/2015  . Suicidal ideation 09/21/2015  . Cannabis use disorder, severe, dependence (HCC) 06/18/2015  . Behavior disturbance   . Schizoaffective disorder, bipolar type (HCC) 04/28/2015  . Adult antisocial behavior 04/04/2015  . Polysubstance abuse (HCC) 04/04/2015  . Homicidal ideation 10/16/2014  . Obstructive sleep apnea 10/16/2014  . Schizophrenia (HCC) 10/10/2014    Past Surgical History:  Procedure Laterality Date  . KNEE SURGERY    . MOUTH SURGERY          Home Medications    Prior to Admission medications   Medication Sig Start Date End Date Taking? Authorizing Provider    albuterol (PROVENTIL HFA;VENTOLIN HFA) 108 (90 Base) MCG/ACT inhaler Inhale 2 puffs into the lungs every 6 (six) hours as needed for wheezing or shortness of breath.    [provider]  amLODipine (NORVASC) 5 MG tablet Take 5 mg by mouth daily. 01/04/18   [provider]  ARIPiprazole (ABILIFY) 10 MG tablet Take by mouth. 04/04/15   [provider]  benztropine (COGENTIN) 1 MG tablet Take 1 tablet (1 mg total) by mouth 2 (two) times daily. 02/18/16   Rolland Porter, MD  benztropine (COGENTIN) 1 MG tablet Take 1 tablet (1 mg total) by mouth 2 (two) times daily. 05/19/17   Samuel Jester, DO  busPIRone (BUSPAR) 15 MG tablet Take 1 tab 3 times daily 04/04/15   [provider]  cetirizine (ZYRTEC) 10 MG tablet Take 10 mg by mouth daily as needed. 01/04/18   [provider]  chlorproMAZINE (THORAZINE) 100 MG tablet 100 mg every morning. 300 Mg every evening 02/18/16   Rolland Porter, MD  chlorproMAZINE (THORAZINE) 100 MG tablet Take 1 tablet (100 mg total) by mouth every morning AND 3 tablets (300 mg total) every evening. 05/19/17   Samuel Jester, DO  clotrimazole (LOTRIMIN) 1 % external solution Apply 1 application topically 2 (two) times daily. To toenails 01/22/18   Asencion Islam, DPM  diphenhydrAMINE (BENADRYL) 25 MG tablet Take 1 tablet (25 mg total) by mouth every 6 (six)  hours. 09/28/15   Emi Holes, PA-C  divalproex (DEPAKOTE ER) 500 MG 24 hr tablet Take 1 in AM and 2 at [facility] 04/04/15   [provider]  fluticasone (FLONASE) 50 MCG/ACT nasal spray SPRAY 2 SPRAYS INTO EACH NOSTRIL EVERY DAY 01/04/18   [provider]  gabapentin (NEURONTIN) 300 MG capsule Take by mouth. 05/13/14   [provider]  haloperidol (HALDOL) 10 MG tablet Take 1 tablet (10 mg total) by mouth 2 (two) times daily. 02/18/16   Rolland Porter, MD  haloperidol (HALDOL) 2 MG tablet Take 1 tablet (2 mg total) by mouth 2 (two) times daily. 05/19/17   Samuel Jester, DO  haloperidol decanoate (HALDOL DECANOATE) 100 MG/ML injection Inject 150 mg into the muscle every 28 (twenty-eight) days.     [provider]  hydrochlorothiazide (MICROZIDE) 12.5 MG capsule Take 1 capsule (12.5 mg total) by mouth daily. 09/11/15   Kristeen Mans, NP  hydrocortisone cream 1 % Apply to affected area 2 times daily Patient taking differently: Apply 1 application topically 2 (two) times daily. Apply to affected area 2 times daily 09/28/15   Law, Waylan Boga, PA-C  lithium 300 MG tablet Take 1 tablet (300 mg total) by mouth 3 (three) times daily. 02/18/16   Rolland Porter, MD  lithium 300 MG tablet Take 1 tablet (300 mg total) by mouth 3 (three) times daily. 05/19/17   Samuel Jester, DO  oxcarbazepine (TRILEPTAL) 600 MG tablet Take 1 tablet (600 mg total) by mouth 2 (two) times daily. 02/18/16   Rolland Porter, MD  oxcarbazepine (TRILEPTAL) 600 MG tablet Take 1 tablet (600 mg total) by mouth 2 (two) times daily. 05/19/17   Samuel Jester, DO  traZODone (DESYREL) 100 MG tablet Take 1 tablet (100 mg total) by mouth at bedtime. 09/11/15   Kristeen Mans, NP  ziprasidone (GEODON) 60 MG capsule Take 1 capsule (60 mg total) by mouth 2 (two) times daily with a meal. 09/11/15   Kristeen Mans, NP    Family History No family history on file.  Social History Social History   Tobacco Use  . Smoking status: Current Every Day Smoker  . Smokeless tobacco: Never Used  Substance Use Topics  . Alcohol use: Yes  . Drug use: Yes    Types: Cocaine, Marijuana, Hydrocodone, MDMA (Ecstacy)    Comment: former     Allergies   Prunus persica; Pseudoephedrine hcl; Sudafed [pseudoephedrine]; and Food   Review of Systems Review of Systems  Unable to perform ROS: Psychiatric disorder     Physical Exam Updated Vital Signs BP 132/90 (BP Location: Left Arm)   Pulse (!) 105   Temp (!) 97.4 F (36.3 C) (Oral)   Resp 18   SpO2 97%   Physical Exam Vitals signs and nursing  note reviewed.  Constitutional:      General: He is not in acute distress.    Appearance: He is well-developed.  HENT:     Head: Normocephalic and atraumatic.  Eyes:     Conjunctiva/sclera: Conjunctivae normal.  Neck:     Musculoskeletal: Neck supple.  Cardiovascular:     Rate and Rhythm: Normal rate and regular rhythm.     Heart sounds: Normal heart sounds.  Pulmonary:     Effort: Pulmonary effort is normal. No respiratory distress.     Breath sounds: No wheezing.  Abdominal:     General: There is no distension.  Skin:    General: Skin is warm.  Capillary Refill: Capillary refill takes less than 2 seconds.     Findings: No rash.  Neurological:     Mental Status: He is alert and oriented to person, place, and time.     Motor: No abnormal muscle tone.  Psychiatric:        Attention and Perception: He perceives auditory hallucinations.        Mood and Affect: Affect is angry.        Behavior: Behavior is agitated and aggressive.      ED Treatments / Results  Labs (all labs ordered are listed, but only abnormal results are displayed) Labs Reviewed  CBC WITH DIFFERENTIAL/PLATELET  COMPREHENSIVE METABOLIC PANEL  ETHANOL  RAPID URINE DRUG SCREEN, HOSP PERFORMED    EKG None  Radiology No results found.  Procedures .Critical Care Performed by: Shaune PollackIsaacs, Liyat Faulkenberry, MD Authorized by: Shaune PollackIsaacs, Shardea Cwynar, MD   Critical care provider statement:    Critical care time (minutes):  35   Critical care time was exclusive of:  Separately billable procedures and treating other patients and teaching time   Critical care was necessary to treat or prevent imminent or life-threatening deterioration of the following conditions:  CNS failure or compromise   Critical care was time spent personally by me on the following activities:  Development of treatment plan with patient or surrogate, discussions with consultants, evaluation of patient's response to treatment, examination of patient,  obtaining history from patient or surrogate, ordering and performing treatments and interventions, ordering and review of laboratory studies, ordering and review of radiographic studies, pulse oximetry, re-evaluation of patient's condition and review of old charts   I assumed direction of critical care for this patient from another provider in my specialty: no     (including critical care time)  Medications Ordered in ED Medications  diphenhydrAMINE (BENADRYL) injection 25 mg (has no administration in time range)  haloperidol lactate (HALDOL) injection 5 mg (5 mg Intramuscular Given 02/06/18 1316)  diphenhydrAMINE (BENADRYL) injection 25 mg (25 mg Intramuscular Given 02/06/18 1317)  ziprasidone (GEODON) injection 20 mg (20 mg Intramuscular Given 02/06/18 1411)     Initial Impression / Assessment and Plan / ED Course  I have reviewed the triage vital signs and the nursing notes.  Pertinent labs & imaging results that were available during my care of the patient were reviewed by me and considered in my medical decision making (see chart for details).  Clinical Course as of Feb 06 1445  Wed Feb 06, 2018  1328 42 yo M well known to this ED here w/ aggression, agitation, and SI/HI. IVC placed on arrival. IM meds given. Likely decompensated schizophrenia with concomitant stimulant abuse.    [CI]  1328 No signs of new head trauma.   [CI]    Clinical Course User Index [CI] Shaune PollackIsaacs, Shamarcus Hoheisel, MD    Pt remains agitated despite IM meds. Additional IM Geodon administered. TTS consult. IVC'ed  Final Clinical Impressions(s) / ED Diagnoses   Final diagnoses:  Paranoid schizophrenia (HCC)  Aggressive behavior  Polysubstance abuse [facility](HCC)    ED Discharge Orders    None       Shaune PollackIsaacs, Gracey Tolle, MD 02/06/18 1446

## 2018-02-06 NOTE — ED Triage Notes (Signed)
He phoned [employer] telling them that he was holding a knife to his throat, and that he is suicidal. He arrives with [employer] officers, with whom he is loudly arguing. He is ambujlatory and in no distress.

## 2018-02-06 NOTE — [facility] Counselor (Signed)
TTS contacted EDP to discussing rescinding IVC in place for patient. EDP reviewing.

## 2018-02-06 NOTE — ED Notes (Signed)
Patient refused to have Ativan. Patient states he only was here to get Haldol. Patient expressed to Lyla Sonarrie, Charity fundraiserN and this Clinical research associatewriter that he was stressed for the Christmas. He states he spent some money that he should have not spent, while attempting to gain trust from his significant other. Patient also reported he called 911 stating he was suicidal. He reports he stated he was suicidal so the police officers would respond sooner.

## 2018-02-06 NOTE — BH Assessment (Addendum)
[facility] Assessment Progress Note  Pt reports that he was supposed to go to [facility] today to receive a scheduled Haldol Decanoate injection.  This Film/video editorwriter called [facility] and at 16:00 I spoke to ThrallJeannie.  She reports that pt receives a 200 mg injection every 4 weeks.  His last injection was administered on 01/16/2018, and he is scheduled for his next injection on 02/21/2018.  Caryn Beeakia Perry Starkes, FNP has been notified.  Per Fredna Dowakia, this pt does not require psychiatric hospitalization at this time.  Pt is psychiatrically cleared.  Pt presents under IVC initiated by EDP Shaune Pollackameron Isaacs, MD.  Celedonio MiyamotoMeredith Turner, TS has notified current EDP of pt's disposition.  Please note that IVC will need to be rescinded before pt is discharged.  Discharge instructions advise pt to follow up [facility], and include the date noted above for his next scheduled Haldol Decanoate injection.  Pt's nurse, Elfredia NevinsKailin, has been notified.   Doylene Canninghomas Spenser Cong, MA Triage Specialist [phone]

## 2018-02-06 NOTE — Discharge Instructions (Addendum)
For your mental health needs, you are advised to continue treatment with [facility].  Your next Haldol Decanoate injection is scheduled for Thursday, February 21, 2018:       [facility]      [address]      [place], [place] [address]      [phone]
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
## 2018-02-06 NOTE — ED Provider Notes (Signed)
Psychiatry has seen and cleared patient.  The patient is calm, awake, and ambulatory without difficulty on my exam.  He states he was just here for a shot.  He specifically denies ever saying he was suicidal although it is possible he was saying he was suicidal to get a ride from the police.  However he adamantly denies SI or HI now and is not aggressive.  He does not appear to have an acute psychiatric emergency.  He appears stable for discharge home and his IVC will be rescinded.   Pricilla LovelessGoldston, Shterna Laramee, MD 02/06/18 [phone]

## 2018-02-06 NOTE — ED Notes (Signed)
Bed: WA27 Expected date:  Expected time:  Means of arrival:  Comments: Res. B

## 2018-02-07 ENCOUNTER — Encounter (HOSPITAL_COMMUNITY): Payer: Self-pay | Admitting: Emergency Medicine

## 2018-02-07 ENCOUNTER — Emergency Department (HOSPITAL_COMMUNITY)
Admission: EM | Admit: 2018-02-07 | Discharge: 2018-02-07 | Disposition: A | Discharge status: Home / Self Care | Payer: Medicaid Other | Attending: Emergency Medicine | Admitting: Emergency Medicine

## 2018-02-07 ENCOUNTER — Other Ambulatory Visit: Payer: Self-pay

## 2018-02-07 ENCOUNTER — Emergency Department (HOSPITAL_COMMUNITY): Payer: Medicaid Other

## 2018-02-07 DIAGNOSIS — F323 Major depressive disorder, single episode, severe with psychotic features: Secondary | ICD-10-CM | POA: Insufficient documentation

## 2018-02-07 DIAGNOSIS — E876 Hypokalemia: Secondary | ICD-10-CM | POA: Insufficient documentation

## 2018-02-07 DIAGNOSIS — F250 Schizoaffective disorder, bipolar type: Secondary | ICD-10-CM | POA: Diagnosis not present

## 2018-02-07 DIAGNOSIS — S0083XA Contusion of other part of head, initial encounter: Secondary | ICD-10-CM | POA: Insufficient documentation

## 2018-02-07 DIAGNOSIS — F329 Major depressive disorder, single episode, unspecified: Secondary | ICD-10-CM | POA: Diagnosis present

## 2018-02-07 DIAGNOSIS — X58XXXA Exposure to other specified factors, initial encounter: Secondary | ICD-10-CM | POA: Diagnosis not present

## 2018-02-07 DIAGNOSIS — Y929 Unspecified place or not applicable: Secondary | ICD-10-CM | POA: Diagnosis not present

## 2018-02-07 DIAGNOSIS — Y939 Activity, unspecified: Secondary | ICD-10-CM | POA: Diagnosis not present

## 2018-02-07 DIAGNOSIS — I10 Essential (primary) hypertension: Secondary | ICD-10-CM | POA: Diagnosis not present

## 2018-02-07 DIAGNOSIS — F17210 Nicotine dependence, cigarettes, uncomplicated: Secondary | ICD-10-CM | POA: Insufficient documentation

## 2018-02-07 DIAGNOSIS — Y999 Unspecified external cause status: Secondary | ICD-10-CM | POA: Diagnosis not present

## 2018-02-07 DIAGNOSIS — G4733 Obstructive sleep apnea (adult) (pediatric): Secondary | ICD-10-CM | POA: Insufficient documentation

## 2018-02-07 DIAGNOSIS — Z79899 Other long term (current) drug therapy: Secondary | ICD-10-CM | POA: Insufficient documentation

## 2018-02-07 LAB — ETHANOL: Alcohol, Ethyl (B): <10 mg/dL (ref <10)

## 2018-02-07 LAB — CBC
HCT: 53.7 % — ABNORMAL HIGH (ref 39.0–52.0)
Hemoglobin: 17.1 g/dL — ABNORMAL HIGH (ref 13.0–17.0)
MCH: 30.3 pg (ref 26.0–34.0)
MCHC: 31.8 g/dL (ref 30.0–36.0)
MCV: 95.2 fL (ref 80.0–100.0)
Platelets: 244 10*3/uL (ref 150–400)
RBC: 5.64 MIL/uL (ref 4.22–5.81)
RDW: 12.1 % (ref 11.5–15.5)
WBC: 7.7 10*3/uL (ref 4.0–10.5)
nRBC: 0 % (ref 0.0–0.2)

## 2018-02-07 LAB — COMPREHENSIVE METABOLIC PANEL
ALT: 24 U/L (ref 0–44)
AST: 33 U/L (ref 15–41)
Albumin: 5.1 g/dL — ABNORMAL HIGH (ref 3.5–5.0)
Alkaline Phosphatase: 70 U/L (ref 38–126)
Anion gap: 10 (ref 5–15)
BUN: 10 mg/dL (ref 6–20)
CO2: 27 mmol/L (ref 22–32)
Calcium: 10.2 mg/dL (ref 8.9–10.3)
Chloride: 106 mmol/L (ref 98–111)
Creatinine, Ser: 1.17 mg/dL (ref 0.61–1.24)
GFR calc Af Amer: >60 mL/min (ref >60)
GFR calc non Af Amer: >60 mL/min (ref >60)
Glucose, Bld: 119 mg/dL — ABNORMAL HIGH (ref 70–99)
Potassium: 3.2 mmol/L — ABNORMAL LOW (ref 3.5–5.1)
Sodium: 143 mmol/L (ref 135–145)
Total Bilirubin: 1 mg/dL (ref 0.3–1.2)
Total Protein: 8 g/dL (ref 6.5–8.1)

## 2018-02-07 LAB — SALICYLATE LEVEL: Salicylate Lvl: <7 mg/dL (ref 2.8–30.0)

## 2018-02-07 LAB — ACETAMINOPHEN LEVEL: Acetaminophen (Tylenol), Serum: <10 ug/mL — ABNORMAL LOW (ref 10–30)

## 2018-02-07 MED ORDER — HALOPERIDOL 5 MG PO TABS
10.0000 mg | ORAL_TABLET | Freq: Two times a day (BID) | ORAL | Status: DC
  Filled 2018-02-07: qty 2

## 2018-02-07 MED ORDER — CHLORPROMAZINE HCL 25 MG PO TABS
100.0000 mg | ORAL_TABLET | Freq: Every morning | ORAL | Status: DC
  Filled 2018-02-07: qty 4

## 2018-02-07 MED ORDER — LITHIUM CARBONATE 300 MG PO CAPS
600.0000 mg | ORAL_CAPSULE | Freq: Two times a day (BID) | ORAL | Status: DC
  Filled 2018-02-07: qty 2

## 2018-02-07 MED ORDER — CHLORPROMAZINE HCL 50 MG PO TABS: 300.0000 mg | ORAL_TABLET | Freq: Every day | ORAL | Status: DC

## 2018-02-07 MED ORDER — POTASSIUM CHLORIDE CRYS ER 20 MEQ PO TBCR
40.0000 meq | EXTENDED_RELEASE_TABLET | Freq: Once | ORAL | Status: DC
  Filled 2018-02-07: qty 2

## 2018-02-07 MED ORDER — OXCARBAZEPINE 300 MG PO TABS: 600.0000 mg | ORAL_TABLET | Freq: Every day | ORAL | Status: DC

## 2018-02-07 MED ORDER — BENZTROPINE MESYLATE 1 MG PO TABS
2.0000 mg | ORAL_TABLET | Freq: Two times a day (BID) | ORAL | Status: DC
  Filled 2018-02-07: qty 2

## 2018-02-07 MED ORDER — POTASSIUM CHLORIDE CRYS ER 20 MEQ PO TBCR
40.0000 meq | EXTENDED_RELEASE_TABLET | Freq: Two times a day (BID) | ORAL | Status: DC
  Filled 2018-02-07: qty 2

## 2018-02-07 NOTE — ED Triage Notes (Signed)
Patient states he is not suicidal and homicidal. Patient states he is depressed.

## 2018-02-07 NOTE — Discharge Instructions (Signed)
For your mental health needs, you are advised to continue treatment with [facility].  Your next Haldol Decanoate injection is scheduled for Thursday, February 21, 2018: ° °     [facility] °     [address] °     [place], [place] [address] °     [phone] °

## 2018-02-07 NOTE — ED Notes (Signed)
[name] SievertSpencer Simon, PA, recommends overnight observation for stabilization and safety with psych re evaluation, also recommends Care Plan, [facility]. Joanie CoddingtonLatricia, RN, informed of disposition.

## 2018-02-07 NOTE — ED Notes (Signed)
Bed: [id number] Expected date:  Expected time:  Means of arrival:  Comments: [place]

## 2018-02-07 NOTE — ED Notes (Signed)
Would not wake up to take lithium this morning.

## 2018-02-07 NOTE — BH Assessment (Signed)
[facility]  Per Juanetta BeetsJacqueline Norman, DO, this pt does not require psychiatric hospitalization at this time.  [facility]  Pt has an appointment with [facility] to receive a Haldol Decanoate injection on 01/2019.  This has been included in pt's discharge instructions.  Pt's nurse, Diane, has been notified.  Doylene Canninghomas Eartha Vonbehren, MA Triage Specialist [phone]
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
## 2018-02-07 NOTE — ED Notes (Signed)
At discharge pt was reluctant to leave because he wants to go to [place]. It was explained to him that he needs [facility] February [facility], 2018. Pt continued to be reluctant to leave and required police and security presence. He wanted to stay here. This Clinical research associatewriter mentioned to pt that he refused the medications that he offered him and that we had done all that we can do to help him at this time. He became threatening to this Clinical research associatewriter because he felt that it was a rude thing to say. Security and [employer] were required to get pt discharged. All belongings were returned to pt. Pt refused VS and refused to sign anything. He was angry at the time of D/C.

## 2018-02-07 NOTE — ED Notes (Signed)
AC Tammy provided pt with a cheeseburger to go and escorted out of building with [employer].

## 2018-02-07 NOTE — ED Notes (Addendum)
Pt A&O x 3, calm at present, guarded, not forthcoming with information, reports he is Depressed, denies SI or HI.  Pt [facility].  Denies feeling hopeless.  Monitoring for safety, Q 15 min checks in effect.Swelling to Right face noted, pending CT scan.

## 2018-02-07 NOTE — ED Notes (Addendum)
Pt refusing to be discharged until he gets a cheeseburger.  [employer] unwilling to escourt him out of the building.  AC Tammy made aware. She will bring him a cheeseburger.

## 2018-02-07 NOTE — ED Notes (Signed)
Pt refused oral Potassium and refused Lithium level blood draw.  Pt states he wants to be left alone.
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
## 2018-02-19 ENCOUNTER — Encounter (HOSPITAL_COMMUNITY): Payer: Self-pay

## 2018-02-19 ENCOUNTER — Other Ambulatory Visit: Payer: Self-pay

## 2018-02-19 ENCOUNTER — Emergency Department (HOSPITAL_COMMUNITY)
Admission: EM | Admit: 2018-02-19 | Discharge: 2018-02-21 | Disposition: A | Discharge status: Home / Self Care | Payer: Medicaid Other | Attending: Emergency Medicine | Admitting: Emergency Medicine

## 2018-02-19 DIAGNOSIS — R45850 Homicidal ideations: Secondary | ICD-10-CM | POA: Diagnosis not present

## 2018-02-19 DIAGNOSIS — F17200 Nicotine dependence, unspecified, uncomplicated: Secondary | ICD-10-CM | POA: Insufficient documentation

## 2018-02-19 DIAGNOSIS — J45909 Unspecified asthma, uncomplicated: Secondary | ICD-10-CM | POA: Diagnosis not present

## 2018-02-19 DIAGNOSIS — F1410 Cocaine abuse, uncomplicated: Secondary | ICD-10-CM | POA: Insufficient documentation

## 2018-02-19 DIAGNOSIS — I10 Essential (primary) hypertension: Secondary | ICD-10-CM | POA: Insufficient documentation

## 2018-02-19 DIAGNOSIS — Z79899 Other long term (current) drug therapy: Secondary | ICD-10-CM | POA: Insufficient documentation

## 2018-02-19 DIAGNOSIS — F1210 Cannabis abuse, uncomplicated: Secondary | ICD-10-CM | POA: Insufficient documentation

## 2018-02-19 DIAGNOSIS — F250 Schizoaffective disorder, bipolar type: Secondary | ICD-10-CM | POA: Diagnosis present

## 2018-02-19 DIAGNOSIS — Z046 Encounter for general psychiatric examination, requested by authority: Secondary | ICD-10-CM | POA: Diagnosis present

## 2018-02-19 DIAGNOSIS — R45851 Suicidal ideations: Secondary | ICD-10-CM | POA: Diagnosis not present

## 2018-02-19 NOTE — ED Notes (Signed)
Bed: WA30 Expected date:  Expected time:  Means of arrival:  Comments: 

## 2018-02-19 NOTE — ED Triage Notes (Signed)
Patient states he was dropped off by male friend due to drug and alcohol use. He states he desires to get better for the New Year. He denies SI & HI.

## 2018-02-20 LAB — CBC
HCT: 51.7 % (ref 39.0–52.0)
Hemoglobin: 16.4 g/dL (ref 13.0–17.0)
MCH: 31.2 pg (ref 26.0–34.0)
MCHC: 31.7 g/dL (ref 30.0–36.0)
MCV: 98.5 fL (ref 80.0–100.0)
Platelets: 160 10*3/uL (ref 150–400)
RBC: 5.25 MIL/uL (ref 4.22–5.81)
RDW: 12.3 % (ref 11.5–15.5)
WBC: 7.5 10*3/uL (ref 4.0–10.5)
nRBC: 0 % (ref 0.0–0.2)

## 2018-02-20 LAB — RAPID URINE DRUG SCREEN, HOSP PERFORMED
Amphetamines: NOT DETECTED
Barbiturates: NOT DETECTED
Benzodiazepines: NOT DETECTED
Cocaine: POSITIVE — AB
Opiates: NOT DETECTED
Tetrahydrocannabinol: POSITIVE — AB

## 2018-02-20 LAB — COMPREHENSIVE METABOLIC PANEL
ALT: 21 U/L (ref 0–44)
AST: 24 U/L (ref 15–41)
Albumin: 4.7 g/dL (ref 3.5–5.0)
Alkaline Phosphatase: 63 U/L (ref 38–126)
Anion gap: 9 (ref 5–15)
BUN: 10 mg/dL (ref 6–20)
CO2: 28 mmol/L (ref 22–32)
Calcium: 10.1 mg/dL (ref 8.9–10.3)
Chloride: 107 mmol/L (ref 98–111)
Creatinine, Ser: 0.98 mg/dL (ref 0.61–1.24)
GFR calc Af Amer: >60 mL/min (ref >60)
GFR calc non Af Amer: >60 mL/min (ref >60)
Glucose, Bld: 100 mg/dL — ABNORMAL HIGH (ref 70–99)
Potassium: 4 mmol/L (ref 3.5–5.1)
Sodium: 144 mmol/L (ref 135–145)
Total Bilirubin: 1 mg/dL (ref 0.3–1.2)
Total Protein: 7.7 g/dL (ref 6.5–8.1)

## 2018-02-20 LAB — ETHANOL: Alcohol, Ethyl (B): <10 mg/dL (ref <10)

## 2018-02-20 MED ORDER — HALOPERIDOL DECANOATE 100 MG/ML IM SOLN
200.0000 mg | INTRAMUSCULAR | Status: DC
  Administered 2018-02-20: 200 mg via INTRAMUSCULAR
  Filled 2018-02-20: qty 2

## 2018-02-20 MED ORDER — HALOPERIDOL 5 MG PO TABS
10.0000 mg | ORAL_TABLET | Freq: Two times a day (BID) | ORAL | Status: DC
  Administered 2018-02-20 – 2018-02-21 (×3): 10 mg via ORAL
  Filled 2018-02-20 (×3): qty 2

## 2018-02-20 MED ORDER — TRAZODONE HCL 100 MG PO TABS
100.0000 mg | ORAL_TABLET | Freq: Every evening | ORAL | Status: DC | PRN
  Administered 2018-02-20: 100 mg via ORAL
  Filled 2018-02-20: qty 1

## 2018-02-20 MED ORDER — OLANZAPINE 5 MG PO TBDP
5.0000 mg | ORAL_TABLET | Freq: Three times a day (TID) | ORAL | Status: DC | PRN
  Administered 2018-02-20: 5 mg via ORAL
  Filled 2018-02-20: qty 1

## 2018-02-20 MED ORDER — OXCARBAZEPINE 300 MG PO TABS
600.0000 mg | ORAL_TABLET | Freq: Two times a day (BID) | ORAL | Status: DC
  Administered 2018-02-20 – 2018-02-21 (×3): 600 mg via ORAL
  Filled 2018-02-20 (×3): qty 2

## 2018-02-20 MED ORDER — LORAZEPAM 1 MG PO TABS
1.0000 mg | ORAL_TABLET | Freq: Three times a day (TID) | ORAL | Status: AC | PRN
  Administered 2018-02-20: 1 mg via ORAL
  Filled 2018-02-20: qty 1

## 2018-02-20 MED ORDER — LITHIUM CARBONATE 300 MG PO CAPS
300.0000 mg | ORAL_CAPSULE | Freq: Two times a day (BID) | ORAL | Status: DC
  Administered 2018-02-20 – 2018-02-21 (×3): 300 mg via ORAL
  Filled 2018-02-20 (×3): qty 1

## 2018-02-20 NOTE — BH Assessment (Signed)
BHH Assessment Progress Note  Per Neysa Hotter, MD, this pt requires psychiatric hospitalization at this time.  Pt presents under IVC initiated by EDP Geoffery Lyons, MD.  The following facilities have been contacted to seek placement for this pt, with results as noted:  Beds available, information sent, decision pending:  [facility]    Declined:  [facility] (pt too acute for milieu at this time)   Doylene Canning, [place] Behavioral Health Coordinator [phone]
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
## 2018-02-20 NOTE — ED Notes (Signed)
Bed: [facility]42 Expected date:  Expected time:  Means of arrival:  Comments: 

## 2018-02-20 NOTE — ED Notes (Signed)
Pt currently sleeping. Will attempt to obtain EKG when pt awakes.

## 2018-02-20 NOTE — ED Notes (Signed)
Pt c/o increase anxiety, and agitation. Requesting IM medication. This nurse notified Conrad,NP

## 2018-02-20 NOTE — ED Notes (Signed)
Pt pacing hallway, hitting walls, Renata Caprice NP aware

## 2018-02-20 NOTE — ED Notes (Addendum)
Pt presents accompanied by [employer], SI and agitation.  Pt cooperative at present, conversing with [employer].  Awake, alert & responsive, no distress noted, calm at present, labile. Monitoring for safety, Q 15 min checks in effect.  Pt states I need some help, like a facility at [facility].

## 2018-02-20 NOTE — ED Notes (Signed)
EKG given to EDP for review. No new orders.
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
## 2018-02-21 DIAGNOSIS — F250 Schizoaffective disorder, bipolar type: Secondary | ICD-10-CM

## 2018-02-21 NOTE — ED Notes (Signed)
Per order, pt was discharged. He was in no acute distress and in agreement with discharge when he was escorted off unit by security and [employer] officer. Pt had said he was going to "fuck upInsurance risk surveyor after leaving; [employer] was made aware. Pt was given belongings/AVS and discharge instructions were reviewed. Pt's wife/girlfriend was awaiting in lobby to take him home.

## 2018-02-21 NOTE — ED Notes (Signed)
Went to bed and asleep early in this writers shift. Writer had no verbal interaction with him, unable to assess mental status. No behavior issues and will continue to monitor for safety.
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
## 2018-02-21 NOTE — BH Assessment (Signed)
[facility] Assessment Progress Note  Per Neysa Hotter, MD, this pt does not require psychiatric hospitalization at this time.  Pt presents under IVC initiated by EDP Geoffery Lyons, MD, which Dr Vanetta Shawl has rescinded.  Pt is to discharged from [facility] with recommendation to continue treatment with [facility].  This has been included in pt's discharge instructions.  Pt's nurse, Clydie Braun, has been notified.  Doylene Canning, MA Triage Specialist [phone]

## 2018-02-21 NOTE — Consult Note (Signed)
[facility] Psych ED Discharge  02/21/2018 11:30 AM [name] [name]  MRN:  [id number] Principal Problem: Schizoaffective disorder, bipolar type [facility]) Discharge Diagnoses: Principal Problem:   Schizoaffective disorder, bipolar type (HCC)   Subjective: Pt was seen and chart reviewed with treatment team and Dr Vanetta Shawl. Pt denies suicidal/homicidal ideation, denies auditory/visual hallucinations and does not appear to be responding to internal stimuli. Pt stated he feels good today because he got all of his medications yesterday and he got his long acting injectable yesterday. Pt stated he is ready to go home and he will go see Tim at [facility] for medication management. Pt lives with his wife and children and his wife is supportive of him. Pt's UDS positive for cocaine and THC and BAL negative. Pt did initially present with suicidal ideation and agitation but denies those two things today. Pt is calm and can contract for safety upon discharge. Pt is psychiatrically clear for discharge.   Total Time spent with patient: 30 minutes  Past Psychiatric History: As above  Past Medical History:  Past Medical History:  Diagnosis Date  . Aggression   . Alcohol abuse   . Asthma   . Homeless   . Hypertension   . Schizophrenia [facility])     Past Surgical History:  Procedure Laterality Date  . KNEE SURGERY    . MOUTH SURGERY     Family History: History reviewed. No pertinent family history. Family Psychiatric  History: Pt did not disclose this information Social History:  Social History   Substance and Sexual Activity  Alcohol Use Yes     Social History   Substance and Sexual Activity  Drug Use Yes  . Types: Cocaine, Marijuana, Hydrocodone, MDMA (Ecstacy)   Comment: former    Social History   Socioeconomic History  . Marital status: Married    Spouse name: Not on file  . Number of children: Not on file  . Years of education: Not on file  . Highest education level: Not on file  Occupational  History  . Not on file  Social Needs  . Financial resource strain: Not on file  . Food insecurity:    Worry: Not on file    Inability: Not on file  . Transportation needs:    Medical: Not on file    Non-medical: Not on file  Tobacco Use  . Smoking status: Current Every Day Smoker  . Smokeless tobacco: Never Used  Substance and Sexual Activity  . Alcohol use: Yes  . Drug use: Yes    Types: Cocaine, Marijuana, Hydrocodone, MDMA (Ecstacy)    Comment: former  . Sexual activity: Not on file  Lifestyle  . Physical activity:    Days per week: Not on file    Minutes per session: Not on file  . Stress: Not on file  Relationships  . Social connections:    Talks on phone: Not on file    Gets together: Not on file    Attends religious service: Not on file    Active member of club or organization: Not on file    Attends meetings of clubs or organizations: Not on file    Relationship status: Not on file  Other Topics Concern  . Not on file  Social History Narrative  . Not on file    Has this patient used any form of tobacco in the last 30 days? (Cigarettes, Smokeless Tobacco, Cigars, and/or Pipes) Prescription not provided because: Pt declined  Current Medications: Current Facility-Administered Medications  Medication  Dose Route Frequency Provider Last Rate Last Dose  . haloperidol (HALDOL) tablet 10 mg  10 mg Oral BID Geoffery Lyonselo, Douglas, MD   10 mg at 02/21/18 0906  . haloperidol decanoate (HALDOL DECANOATE) 100 MG/ML injection 200 mg  200 mg Intramuscular Q28 days Geoffery Lyonselo, Douglas, MD   200 mg at 02/20/18 1113  . lithium carbonate capsule 300 mg  300 mg Oral BID Geoffery Lyonselo, Douglas, MD   300 mg at 02/21/18 0906  . OLANZapine zydis (ZYPREXA) disintegrating tablet 5 mg  5 mg Oral Q8H PRN Beau FannyWithrow, John C, FNP   5 mg at 02/20/18 1427  . Oxcarbazepine (TRILEPTAL) tablet 600 mg  600 mg Oral BID Geoffery Lyonselo, Douglas, MD   600 mg at 02/21/18 0906  . traZODone (DESYREL) tablet 100 mg  100 mg Oral QHS PRN Geoffery Lyonselo,  Douglas, MD   100 mg at 02/20/18 0109   Current Outpatient Medications  Medication Sig Dispense Refill  . benztropine (COGENTIN) 1 MG tablet Take 1 tablet (1 mg total) by mouth 2 (two) times daily. (Patient taking differently: Take 2 mg by mouth 2 (two) times daily. ) 20 tablet 0  . chlorproMAZINE (THORAZINE) 100 MG tablet Take 1 tablet (100 mg total) by mouth every morning AND 3 tablets (300 mg total) every evening. 38 tablet 0  . haloperidol (HALDOL) 10 MG tablet Take 10 mg by mouth 2 (two) times daily.    . haloperidol decanoate (HALDOL DECANOATE) 100 MG/ML injection Inject 200 mg into the muscle every 28 (twenty-eight) days.     Marland Kitchen. lithium 300 MG tablet Take 1 tablet (300 mg total) by mouth 3 (three) times daily. (Patient taking differently: Take 300 mg by mouth 2 (two) times daily. ) 30 tablet 0  . oxcarbazepine (TRILEPTAL) 600 MG tablet Take 1 tablet (600 mg total) by mouth 2 (two) times daily. 20 tablet 0  . traZODone (DESYREL) 100 MG tablet Take 100 mg by mouth at bedtime as needed for sleep.    . benztropine (COGENTIN) 1 MG tablet Take 1 tablet (1 mg total) by mouth 2 (two) times daily. (Patient not taking: Reported on 02/07/2018) 60 tablet 0  . chlorproMAZINE (THORAZINE) 100 MG tablet 100 mg every morning. 300 Mg every evening (Patient not taking: Reported on 02/07/2018) 120 tablet 0  . clotrimazole (LOTRIMIN) 1 % external solution Apply 1 application topically 2 (two) times daily. To toenails 60 mL 5  . diphenhydrAMINE (BENADRYL) 25 MG tablet Take 1 tablet (25 mg total) by mouth every 6 (six) hours. (Patient not taking: Reported on 02/07/2018) 20 tablet 0  . haloperidol (HALDOL) 10 MG tablet Take 1 tablet (10 mg total) by mouth 2 (two) times daily. (Patient not taking: Reported on 02/07/2018) 60 tablet 0  . haloperidol (HALDOL) 2 MG tablet Take 1 tablet (2 mg total) by mouth 2 (two) times daily. (Patient not taking: Reported on 02/20/2018) 20 tablet 0  . hydrochlorothiazide (MICROZIDE) 12.5  MG capsule Take 1 capsule (12.5 mg total) by mouth daily. (Patient not taking: Reported on 02/07/2018) 30 capsule 0  . hydrocortisone cream 1 % Apply to affected area 2 times daily (Patient not taking: Reported on 02/07/2018) 20 g 0  . lithium 300 MG tablet Take 1 tablet (300 mg total) by mouth 3 (three) times daily. (Patient not taking: Reported on 02/07/2018) 60 tablet 0  . oxcarbazepine (TRILEPTAL) 600 MG tablet Take 1 tablet (600 mg total) by mouth 2 (two) times daily. (Patient not taking: Reported on 02/07/2018) 60 tablet 0  .  traZODone (DESYREL) 100 MG tablet Take 1 tablet (100 mg total) by mouth at bedtime. (Patient not taking: Reported on 02/07/2018) 30 tablet 0  . ziprasidone (GEODON) 60 MG capsule Take 1 capsule (60 mg total) by mouth 2 (two) times daily with a meal. (Patient not taking: Reported on 02/07/2018) 60 capsule 0   PTA Medications: (Not in a hospital admission)   Musculoskeletal: Strength & Muscle Tone: within normal limits Gait & Station: normal Patient leans: N/A  Psychiatric Specialty Exam: Physical Exam  ROS  Blood pressure (!) (P) 146/92, pulse (P) 77, temperature (P) 98.7 F (37.1 C), temperature source (P) Oral, resp. rate 15, SpO2 (P) 100 %.There is no height or weight on file to calculate BMI.  General Appearance: Casual  Eye Contact:  Good  Speech:  Clear and Coherent and Normal Rate  Volume:  Normal  Mood:  Anxious and Irritable  Affect:  Congruent  Thought Process:  Coherent, Goal Directed, Linear and Descriptions of Associations: Intact  Orientation:  Full (Time, Place, and Person)  Thought Content:  Logical  Suicidal Thoughts:  No  Homicidal Thoughts:  No  Memory:  Immediate;   Good Recent;   Good Remote;   Fair  Judgement:  Fair  Insight:  Fair  Psychomotor Activity:  Normal  Concentration:  Concentration: Good and Attention Span: Good  Recall:  Good  Fund of Knowledge:  Good  Language:  Good  Akathisia:  No  Handed:  Right  AIMS (if  indicated):     Assets:  ArchitectCommunication Skills Financial Resources/Insurance Housing Social Support  ADL's:  Intact  Cognition:  WNL  Sleep:        Demographic Factors:  Male, Low socioeconomic status and Unemployed  Loss Factors: Financial problems/change in socioeconomic status  Historical Factors: Family history of mental illness or substance abuse  Risk Reduction Factors:   Responsible for children under 42 years of age, Sense of responsibility to family and Positive social support  Continued Clinical Symptoms:  Bipolar Disorder:   Mixed State Alcohol/Substance Abuse/Dependencies Previous Psychiatric Diagnoses and Treatments  Cognitive Features That Contribute To Risk:  Closed-mindedness    Suicide Risk:  Minimal: No identifiable suicidal ideation.  Patients presenting with no risk factors but with morbid ruminations; may be classified as minimal risk based on the severity of the depressive symptoms    Plan Of Care/Follow-up recommendations:  Activity:  as tolerated Diet:  Heart healthy  Disposition: Schizoaffective disorder, bipolar type (HCC) [facility] all [facility]. [facility].  Do not consume alcohol or use illegal drugs while on prescription medications. Report any adverse effects from your medications to your primary care provider promptly.  In the event of recurrent symptoms or worsening symptoms, call 911, a crisis hotline, or go to the nearest emergency department for evaluation.   Laveda AbbeLaurie Britton Nevyn Bossman, NP 02/21/2018, 11:30 AM
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
## 2018-02-21 NOTE — Discharge Instructions (Signed)
For your mental health needs, you are advised to continue treatment with [facility]: ° °     [facility] °     [address] °     , [place] [address] °     [phone]

## 2018-03-31 ENCOUNTER — Encounter (HOSPITAL_COMMUNITY): Payer: Self-pay

## 2018-03-31 ENCOUNTER — Emergency Department (HOSPITAL_COMMUNITY)
Admission: EM | Admit: 2018-03-31 | Discharge: 2018-03-31 | Disposition: A | Discharge status: Home / Self Care | Payer: Medicaid Other | Attending: Emergency Medicine | Admitting: Emergency Medicine

## 2018-03-31 ENCOUNTER — Other Ambulatory Visit: Payer: Self-pay

## 2018-03-31 ENCOUNTER — Emergency Department (HOSPITAL_COMMUNITY): Payer: Medicaid Other

## 2018-03-31 DIAGNOSIS — W231XXA Caught, crushed, jammed, or pinched between stationary objects, initial encounter: Secondary | ICD-10-CM | POA: Diagnosis not present

## 2018-03-31 DIAGNOSIS — Y999 Unspecified external cause status: Secondary | ICD-10-CM | POA: Diagnosis not present

## 2018-03-31 DIAGNOSIS — F17210 Nicotine dependence, cigarettes, uncomplicated: Secondary | ICD-10-CM | POA: Diagnosis not present

## 2018-03-31 DIAGNOSIS — Y939 Activity, unspecified: Secondary | ICD-10-CM | POA: Diagnosis not present

## 2018-03-31 DIAGNOSIS — Z79899 Other long term (current) drug therapy: Secondary | ICD-10-CM | POA: Insufficient documentation

## 2018-03-31 DIAGNOSIS — J45909 Unspecified asthma, uncomplicated: Secondary | ICD-10-CM | POA: Insufficient documentation

## 2018-03-31 DIAGNOSIS — Y929 Unspecified place or not applicable: Secondary | ICD-10-CM | POA: Diagnosis not present

## 2018-03-31 DIAGNOSIS — I10 Essential (primary) hypertension: Secondary | ICD-10-CM | POA: Diagnosis not present

## 2018-03-31 DIAGNOSIS — S60222A Contusion of left hand, initial encounter: Secondary | ICD-10-CM | POA: Diagnosis not present

## 2018-03-31 DIAGNOSIS — S60922A Unspecified superficial injury of left hand, initial encounter: Secondary | ICD-10-CM | POA: Diagnosis present

## 2018-03-31 MED ORDER — IBUPROFEN 800 MG PO TABS: 800.0000 mg | ORAL_TABLET | Freq: Three times a day (TID) | ORAL | 0 refills | Status: DC | PRN

## 2018-03-31 NOTE — ED Triage Notes (Signed)
Pt reports L hand pain today after moving furniture. He states that it is dislocated.

## 2018-03-31 NOTE — ED Notes (Signed)
Pt states, "Do you know who I am?"

## 2018-03-31 NOTE — ED Notes (Signed)
Bed: WTR6 Expected date:  Expected time:  Means of arrival:  Comments: 

## 2018-03-31 NOTE — ED Notes (Signed)
Pt refused to sign or repeat vitals before leaving to wait in lobby.

## 2018-03-31 NOTE — ED Provider Notes (Signed)
[facility]-EMERGENCY DEPT Provider Note   CSN: [id number] Arrival date & time: 03/31/18  2152     History   Chief Complaint Chief Complaint  Patient presents with  . Hand Injury    HPI [name] [name] is a 43 y.o. male.  The history is provided by the patient. No language interpreter was used.  Hand Injury   [name] [name] is a 43 y.o. male who presents to the Emergency Department complaining of hand injury. He presents to the emergency department after injuring his hand earlier today. He is right-hand dominant and states that he was moving a piano for work when the piano fell in his hand. He has pain to the left thumb. He is concerned that the bone is sticking out of his hand. He thinks it might've been dislocated. Denies any additional symptoms. Past Medical History:  Diagnosis Date  . Aggression   . Alcohol abuse   . Asthma   . Homeless   . Hypertension   . Schizophrenia [facility])     Patient Active Problem List   Diagnosis Date Noted  . Cocaine abuse (HCC) 10/04/2015  . Suicidal ideation 09/21/2015  . Cannabis use disorder, severe, dependence (HCC) 06/18/2015  . Behavior disturbance   . Schizoaffective disorder, bipolar type (HCC) 04/28/2015  . Adult antisocial behavior 04/04/2015  . Polysubstance abuse (HCC) 04/04/2015  . Homicidal ideation 10/16/2014  . Obstructive sleep apnea 10/16/2014    Past Surgical History:  Procedure Laterality Date  . KNEE SURGERY    . MOUTH SURGERY          Home Medications    Prior to Admission medications   Medication Sig Start Date End Date Taking? Authorizing Provider  benztropine (COGENTIN) 1 MG tablet Take 1 tablet (1 mg total) by mouth 2 (two) times daily. Patient not taking: Reported on 02/07/2018 02/18/16   Rolland Porter, MD  benztropine (COGENTIN) 1 MG tablet Take 1 tablet (1 mg total) by mouth 2 (two) times daily. Patient taking differently: Take 2 mg by mouth 2 (two) times daily.  05/19/17    Samuel Jester, DO  chlorproMAZINE (THORAZINE) 100 MG tablet 100 mg every morning. 300 Mg every evening Patient not taking: Reported on 02/07/2018 02/18/16   Rolland Porter, MD  chlorproMAZINE (THORAZINE) 100 MG tablet Take 1 tablet (100 mg total) by mouth every morning AND 3 tablets (300 mg total) every evening. 05/19/17   Samuel Jester, DO  clotrimazole (LOTRIMIN) 1 % external solution Apply 1 application topically 2 (two) times daily. To toenails 01/22/18   Asencion Islam, DPM  diphenhydrAMINE (BENADRYL) 25 MG tablet Take 1 tablet (25 mg total) by mouth every 6 (six) hours. Patient not taking: Reported on 02/07/2018 09/28/15   Emi Holes, PA-C  haloperidol (HALDOL) 10 MG tablet Take 1 tablet (10 mg total) by mouth 2 (two) times daily. Patient not taking: Reported on 02/07/2018 02/18/16   Rolland Porter, MD  haloperidol (HALDOL) 10 MG tablet Take 10 mg by mouth 2 (two) times daily.    [provider]  haloperidol (HALDOL) 2 MG tablet Take 1 tablet (2 mg total) by mouth 2 (two) times daily. Patient not taking: Reported on 02/20/2018 05/19/17   Samuel Jester, DO  haloperidol decanoate (HALDOL DECANOATE) 100 MG/ML injection Inject 200 mg into the muscle every 28 (twenty-eight) days.     [provider]  hydrochlorothiazide (MICROZIDE) 12.5 MG capsule Take 1 capsule (12.5 mg total) by mouth daily. Patient not taking: Reported on 02/07/2018 09/11/15  Kristeen MansHobson, Fran E, NP  hydrocortisone cream 1 % Apply to affected area 2 times daily Patient not taking: Reported on 02/07/2018 09/28/15   Emi HolesLaw, Alexandra M, PA-C  ibuprofen (ADVIL,MOTRIN) 800 MG tablet Take 1 tablet (800 mg total) by mouth every 8 (eight) hours as needed. 03/31/18   Tilden Fossaees, Jarrin Staley, MD  lithium 300 MG tablet Take 1 tablet (300 mg total) by mouth 3 (three) times daily. Patient not taking: Reported on 02/07/2018 02/18/16   Rolland PorterJames, Mark, MD  lithium 300 MG tablet Take 1 tablet (300 mg total) by mouth 3 (three) times  daily. Patient taking differently: Take 300 mg by mouth 2 (two) times daily.  05/19/17   Samuel JesterMcManus, Kathleen, DO  oxcarbazepine (TRILEPTAL) 600 MG tablet Take 1 tablet (600 mg total) by mouth 2 (two) times daily. Patient not taking: Reported on 02/07/2018 02/18/16   Rolland PorterJames, Mark, MD  oxcarbazepine (TRILEPTAL) 600 MG tablet Take 1 tablet (600 mg total) by mouth 2 (two) times daily. 05/19/17   Samuel JesterMcManus, Kathleen, DO  traZODone (DESYREL) 100 MG tablet Take 1 tablet (100 mg total) by mouth at bedtime. Patient not taking: Reported on 02/07/2018 09/11/15   Kristeen MansHobson, Fran E, NP  traZODone (DESYREL) 100 MG tablet Take 100 mg by mouth at bedtime as needed for sleep.    [provider]  ziprasidone (GEODON) 60 MG capsule Take 1 capsule (60 mg total) by mouth 2 (two) times daily with a meal. Patient not taking: Reported on 02/07/2018 09/11/15   Kristeen MansHobson, Fran E, NP    Family History History reviewed. No pertinent family history.  Social History Social History   Tobacco Use  . Smoking status: Current Every Day Smoker  . Smokeless tobacco: Never Used  Substance Use Topics  . Alcohol use: Yes  . Drug use: Yes    Types: Cocaine, Marijuana, Hydrocodone, MDMA (Ecstacy)    Comment: former     Allergies   Prunus persica; Pseudoephedrine hcl; Sudafed [pseudoephedrine]; and Food   Review of Systems Review of Systems  All other systems reviewed and are negative.    Physical Exam Updated Vital Signs BP (!) 138/101 (BP Location: Left Arm)   Pulse 98   Temp 98.2 F (36.8 C) (Oral)   Resp 16   SpO2 98%   Physical Exam Vitals signs and nursing note reviewed.  Constitutional:      Appearance: Normal appearance.  HENT:     Mouth/Throat:     Mouth: Mucous membranes are moist.  Neck:     Musculoskeletal: Neck supple.  Cardiovascular:     Rate and Rhythm: Normal rate and regular rhythm.  Pulmonary:     Effort: Pulmonary effort is normal. No respiratory distress.  Musculoskeletal:      Comments: 2+ radial pulses bilaterally. There is swelling and tenderness over the left first digit and MCP joint. There is a tiny overlying operation. Patient does not range the digit secondary to pain. Tenderness extends along the entire metacarpal to the wrist.  Skin:    General: Skin is warm and dry.     Capillary Refill: Capillary refill takes less than 2 seconds.  Neurological:     Mental Status: He is alert and oriented to person, place, and time.      ED Treatments / Results  Labs (all labs ordered are listed, but only abnormal results are displayed) Labs Reviewed - No data to display  EKG None  Radiology Dg Hand Complete Left  Result Date: 03/31/2018 CLINICAL DATA:  LEFT hand  pain today after moving a pN0, pain at first metacarpal EXAM: LEFT HAND - COMPLETE 3+ VIEW COMPARISON:  None FINDINGS: Artifacts at wrist. Osseous mineralization normal. Joint spaces preserved. No fracture, dislocation, or bone destruction. IMPRESSION: No acute osseous abnormalities. Electronically Signed   By: Ulyses SouthwardMark  Boles M.D.   On: 03/31/2018 22:19    Procedures Procedures (including critical care time)  Medications Ordered in ED Medications - No data to display   Initial Impression / Assessment and Plan / ED Course  I have reviewed the triage vital signs and the nursing notes.  Pertinent labs & imaging results that were available during my care of the patient were reviewed by me and considered in my medical decision making (see chart for details).    Patient here for evaluation of hand injury. He states the bone is sticking out but there is no obvious fracture or dislocation. Pain limits range of motion testing. Will place and thumb Spica with outpatient hand follow-up. Counseled patient on home care, outpatient follow-up and return precautions. Final Clinical Impressions(s) / ED Diagnoses   Final diagnoses:  Contusion of left hand, initial encounter    ED Discharge Orders         Ordered     ibuprofen (ADVIL,MOTRIN) 800 MG tablet  Every 8 hours PRN     03/31/18 2237           Tilden Fossaees, Annalyse Langlais, MD 03/31/18 2240

## 2018-04-13 ENCOUNTER — Emergency Department (HOSPITAL_COMMUNITY)
Admission: EM | Admit: 2018-04-13 | Discharge: 2018-04-14 | Disposition: A | Discharge status: Home / Self Care | Payer: Medicaid Other | Attending: Emergency Medicine | Admitting: Emergency Medicine

## 2018-04-13 ENCOUNTER — Encounter (HOSPITAL_COMMUNITY): Payer: Self-pay | Admitting: Emergency Medicine

## 2018-04-13 ENCOUNTER — Emergency Department (HOSPITAL_COMMUNITY): Payer: Medicaid Other

## 2018-04-13 ENCOUNTER — Other Ambulatory Visit: Payer: Self-pay

## 2018-04-13 DIAGNOSIS — R079 Chest pain, unspecified: Secondary | ICD-10-CM | POA: Diagnosis not present

## 2018-04-13 DIAGNOSIS — J45909 Unspecified asthma, uncomplicated: Secondary | ICD-10-CM | POA: Diagnosis not present

## 2018-04-13 DIAGNOSIS — R51 Headache: Secondary | ICD-10-CM | POA: Insufficient documentation

## 2018-04-13 DIAGNOSIS — I10 Essential (primary) hypertension: Secondary | ICD-10-CM | POA: Diagnosis not present

## 2018-04-13 DIAGNOSIS — F17210 Nicotine dependence, cigarettes, uncomplicated: Secondary | ICD-10-CM | POA: Insufficient documentation

## 2018-04-13 DIAGNOSIS — Z79899 Other long term (current) drug therapy: Secondary | ICD-10-CM | POA: Diagnosis not present

## 2018-04-13 DIAGNOSIS — F1410 Cocaine abuse, uncomplicated: Secondary | ICD-10-CM

## 2018-04-13 LAB — COMPREHENSIVE METABOLIC PANEL
ALT: 14 U/L (ref 0–44)
AST: 26 U/L (ref 15–41)
Albumin: 3.6 g/dL (ref 3.5–5.0)
Alkaline Phosphatase: 60 U/L (ref 38–126)
Anion gap: 7 (ref 5–15)
BUN: 9 mg/dL (ref 6–20)
CO2: 26 mmol/L (ref 22–32)
Calcium: 9.1 mg/dL (ref 8.9–10.3)
Chloride: 109 mmol/L (ref 98–111)
Creatinine, Ser: 1 mg/dL (ref 0.61–1.24)
GFR calc Af Amer: >60 mL/min (ref >60)
GFR calc non Af Amer: >60 mL/min (ref >60)
Glucose, Bld: 71 mg/dL (ref 70–99)
Potassium: 4.3 mmol/L (ref 3.5–5.1)
Sodium: 142 mmol/L (ref 135–145)
Total Bilirubin: 0.6 mg/dL (ref 0.3–1.2)
Total Protein: 5.9 g/dL — ABNORMAL LOW (ref 6.5–8.1)

## 2018-04-13 LAB — CBC WITH DIFFERENTIAL/PLATELET
Abs Immature Granulocytes: 0.01 10*3/uL (ref 0.00–0.07)
Basophils Absolute: 0 10*3/uL (ref 0.0–0.1)
Basophils Relative: 0 %
Eosinophils Absolute: 0.1 10*3/uL (ref 0.0–0.5)
Eosinophils Relative: 2 %
HCT: 45.7 % (ref 39.0–52.0)
Hemoglobin: 14.4 g/dL (ref 13.0–17.0)
Immature Granulocytes: 0 %
Lymphocytes Relative: 25 %
Lymphs Abs: 1.6 10*3/uL (ref 0.7–4.0)
MCH: 29.9 pg (ref 26.0–34.0)
MCHC: 31.5 g/dL (ref 30.0–36.0)
MCV: 95 fL (ref 80.0–100.0)
Monocytes Absolute: 0.5 10*3/uL (ref 0.1–1.0)
Monocytes Relative: 8 %
Neutro Abs: 4.1 10*3/uL (ref 1.7–7.7)
Neutrophils Relative %: 65 %
Platelets: 187 10*3/uL (ref 150–400)
RBC: 4.81 MIL/uL (ref 4.22–5.81)
RDW: 11.8 % (ref 11.5–15.5)
WBC: 6.2 10*3/uL (ref 4.0–10.5)
nRBC: 0 % (ref 0.0–0.2)

## 2018-04-13 LAB — I-STAT CREATININE, ED: Creatinine, Ser: 1 mg/dL (ref 0.61–1.24)

## 2018-04-13 LAB — I-STAT TROPONIN, ED
Troponin i, poc: 0 ng/mL (ref 0.00–0.08)
Troponin i, poc: 0 ng/mL (ref 0.00–0.08)

## 2018-04-13 MED ORDER — IOPAMIDOL (ISOVUE-370) INJECTION 76%
INTRAVENOUS | Status: AC
  Filled 2018-04-13: qty 100

## 2018-04-13 MED ORDER — IOPAMIDOL (ISOVUE-370) INJECTION 76%
100.0000 mL | Freq: Once | INTRAVENOUS | Status: AC | PRN
  Administered 2018-04-13: 100 mL via INTRAVENOUS

## 2018-04-13 MED ORDER — LACTATED RINGERS IV BOLUS
1000.0000 mL | Freq: Once | INTRAVENOUS | Status: AC
  Administered 2018-04-13: 1000 mL via INTRAVENOUS

## 2018-04-13 MED ORDER — FENTANYL CITRATE (PF) 100 MCG/2ML IJ SOLN
100.0000 ug | Freq: Once | INTRAMUSCULAR | Status: AC
  Administered 2018-04-13: 100 ug via INTRAVENOUS
  Filled 2018-04-13: qty 2

## 2018-04-13 NOTE — ED Provider Notes (Signed)
[facility] Provider Note   CSN: [id number] Arrival date & time: 04/13/18  1844    History   Chief Complaint Chief Complaint  Patient presents with  . Chest Pain  . Addiction Problem  . Headache    HPI [name] [name] is a 43 y.o. male.     HPI  43 year old male presents with chest pain.  Arrives via EMS.  Started about 15 minutes prior to arrival.  He states that he was doing a large amount of cocaine shortly before this.  He is also endorsing a posterior headache.  He is having low back pain and pain all along the left side of his body including his arm and leg.  Pain in his chest feels like a [employer]er[employer]ack truck ran over him.  Has never had pain like this before.  Also asked for imaging of his left thumb based on continued pain after an injury about 2 weeks ago to the same thumb.  He feels like it popped out of place and he put it back in.  Past Medical History:  Diagnosis Date  . Aggression   . Alcohol abuse   . Asthma   . Homeless   . Hypertension   . Schizophrenia [facility](HCC)     Patient Active Problem List   Diagnosis Date Noted  . Cocaine abuse (HCC) 10/04/2015  . Suicidal ideation 09/21/2015  . Cannabis use disorder, severe, dependence (HCC) 06/18/2015  . Behavior disturbance   . Schizoaffective disorder, bipolar type (HCC) 04/28/2015  . Adult antisocial behavior 04/04/2015  . Polysubstance abuse (HCC) 04/04/2015  . Homicidal ideation 10/16/2014  . Obstructive sleep apnea 10/16/2014    Past Surgical History:  Procedure Laterality Date  . KNEE SURGERY    . MOUTH SURGERY          Home Medications    Prior to Admission medications   Medication Sig Start Date End Date Taking? Authorizing Provider  benztropine (COGENTIN) 1 MG tablet Take 1 tablet (1 mg total) by mouth 2 (two) times daily. Patient not taking: Reported on 02/07/2018 02/18/16   Rolland PorterJames, Mark, MD  benztropine (COGENTIN) 1 MG tablet Take 1 tablet (1 mg total) by  mouth 2 (two) times daily. Patient taking differently: Take 2 mg by mouth 2 (two) times daily.  05/19/17   Samuel JesterMcManus, Kathleen, DO  chlorproMAZINE (THORAZINE) 100 MG tablet 100 mg every morning. 300 Mg every evening Patient not taking: Reported on 02/07/2018 02/18/16   Rolland PorterJames, Mark, MD  chlorproMAZINE (THORAZINE) 100 MG tablet Take 1 tablet (100 mg total) by mouth every morning AND 3 tablets (300 mg total) every evening. 05/19/17   Samuel JesterMcManus, Kathleen, DO  clotrimazole (LOTRIMIN) 1 % external solution Apply 1 application topically 2 (two) times daily. To toenails 01/22/18   Asencion IslamStover, Titorya, DPM  diphenhydrAMINE (BENADRYL) 25 MG tablet Take 1 tablet (25 mg total) by mouth every 6 (six) hours. Patient not taking: Reported on 02/07/2018 09/28/15   Emi HolesLaw, Alexandra M, PA-C  haloperidol (HALDOL) 10 MG tablet Take 1 tablet (10 mg total) by mouth 2 (two) times daily. Patient not taking: Reported on 02/07/2018 02/18/16   Rolland PorterJames, Mark, MD  haloperidol (HALDOL) 10 MG tablet Take 10 mg by mouth 2 (two) times daily.    [provider]  haloperidol (HALDOL) 2 MG tablet Take 1 tablet (2 mg total) by mouth 2 (two) times daily. Patient not taking: Reported on 02/20/2018 05/19/17   Samuel JesterMcManus, Kathleen, DO  haloperidol decanoate (HALDOL DECANOATE) 100 MG/ML  injection Inject 200 mg into the muscle every 28 (twenty-eight) days.     [provider]  hydrochlorothiazide (MICROZIDE) 12.5 MG capsule Take 1 capsule (12.5 mg total) by mouth daily. Patient not taking: Reported on 02/07/2018 09/11/15   Kristeen Mans, NP  hydrocortisone cream 1 % Apply to affected area 2 times daily Patient not taking: Reported on 02/07/2018 09/28/15   Emi Holes, PA-C  ibuprofen (ADVIL,MOTRIN) 800 MG tablet Take 1 tablet (800 mg total) by mouth every 8 (eight) hours as needed. 03/31/18   Tilden Fossa, MD  lithium 300 MG tablet Take 1 tablet (300 mg total) by mouth 3 (three) times daily. Patient not taking: Reported on 02/07/2018  02/18/16   Rolland Porter, MD  lithium 300 MG tablet Take 1 tablet (300 mg total) by mouth 3 (three) times daily. Patient taking differently: Take 300 mg by mouth 2 (two) times daily.  05/19/17   Samuel Jester, DO  oxcarbazepine (TRILEPTAL) 600 MG tablet Take 1 tablet (600 mg total) by mouth 2 (two) times daily. Patient not taking: Reported on 02/07/2018 02/18/16   Rolland Porter, MD  oxcarbazepine (TRILEPTAL) 600 MG tablet Take 1 tablet (600 mg total) by mouth 2 (two) times daily. 05/19/17   Samuel Jester, DO  traZODone (DESYREL) 100 MG tablet Take 1 tablet (100 mg total) by mouth at bedtime. Patient not taking: Reported on 02/07/2018 09/11/15   Kristeen Mans, NP  traZODone (DESYREL) 100 MG tablet Take 100 mg by mouth at bedtime as needed for sleep.    [provider]  ziprasidone (GEODON) 60 MG capsule Take 1 capsule (60 mg total) by mouth 2 (two) times daily with a meal. Patient not taking: Reported on 02/07/2018 09/11/15   Kristeen Mans, NP    Family History No family history on file.  Social History Social History   Tobacco Use  . Smoking status: Current Every Day Smoker  . Smokeless tobacco: Never Used  Substance Use Topics  . Alcohol use: Yes  . Drug use: Yes    Types: Cocaine, Marijuana, Hydrocodone, MDMA (Ecstacy)    Comment: former     Allergies   Prunus persica; Pseudoephedrine hcl; Sudafed [pseudoephedrine]; and Food   Review of Systems Review of Systems  Respiratory: Positive for shortness of breath.   Cardiovascular: Positive for chest pain.  Gastrointestinal: Negative for vomiting.  Musculoskeletal: Positive for arthralgias, back pain and joint swelling (left thumb).  Neurological: Positive for headaches. Negative for weakness.     Physical Exam Updated Vital Signs BP 111/66   Pulse 70   Resp 12   SpO2 98%   Physical Exam Vitals signs and nursing note reviewed.  Constitutional:      General: He is not in acute distress.    Appearance: He  is well-developed. He is not ill-appearing or diaphoretic.  HENT:     Head: Normocephalic and atraumatic.     Right Ear: External ear normal.     Left Ear: External ear normal.     Nose: Nose normal.  Eyes:     General:        Right eye: No discharge.        Left eye: No discharge.  Neck:     Musculoskeletal: Neck supple.  Cardiovascular:     Rate and Rhythm: Normal rate and regular rhythm.     Pulses:          Radial pulses are 2+ on the right side and 2+ on the  left side.       Dorsalis pedis pulses are 2+ on the right side and 2+ on the left side.     Heart sounds: Normal heart sounds.  Pulmonary:     Effort: Pulmonary effort is normal.     Breath sounds: Normal breath sounds.  Chest:     Chest wall: Tenderness present.    Abdominal:     Palpations: Abdomen is soft.     Tenderness: There is no abdominal tenderness.  Musculoskeletal:     Left hand: He exhibits tenderness.       Hands:  Skin:    General: Skin is warm and dry.  Neurological:     Mental Status: He is alert.     Comments: No facial droop or slurred speech.  5/5 strength in all 4 extremities, though somewhat limited in the left arm and left leg due to pain in these extremities.  Normal gross sensation.  Psychiatric:        Mood and Affect: Mood is not anxious.      ED Treatments / Results  Labs (all labs ordered are listed, but only abnormal results are displayed) Labs Reviewed  COMPREHENSIVE METABOLIC PANEL - Abnormal; Notable for the following components:      Result Value   Total Protein 5.9 (*)    All other components within normal limits  CBC WITH DIFFERENTIAL/PLATELET  I-STAT CREATININE, ED  I-STAT TROPONIN, ED  I-STAT TROPONIN, ED    EKG EKG Interpretation  Date/Time:  Saturday April 13 2018 20:11:08 EST Ventricular Rate:  71 PR Interval:    QRS Duration: 83 QT Interval:  367 QTC Calculation: 399 R Axis:   30 Text Interpretation:  Sinus rhythm ST elev, probable normal early  repol pattern no reciprocal changes no significant change since Jan 2020 Confirmed by Pricilla Loveless [phone]) on 04/13/2018 9:45:08 PM   EKG Interpretation  Date/Time:  Saturday April 13 2018 23:23:19 EST Ventricular Rate:  70 PR Interval:    QRS Duration: 85 QT Interval:  373 QTC Calculation: 403 R Axis:   36 Text Interpretation:  Sinus rhythm ST elev, probable normal early repol pattern no significant change since earlier in the day Confirmed by Pricilla Loveless [phone]) on 04/13/2018 11:32:07 PM        Radiology Ct Head Wo Contrast  Result Date: 04/13/2018 CLINICAL DATA:  Headache EXAM: CT HEAD WITHOUT CONTRAST TECHNIQUE: Contiguous axial images were obtained from the base of the skull through the vertex without intravenous contrast. COMPARISON:  03/29/2015 FINDINGS: Brain: No acute intracranial abnormality. Specifically, no hemorrhage, hydrocephalus, mass lesion, acute infarction, or significant intracranial injury. Vascular: No hyperdense vessel or unexpected calcification. Skull: No acute calvarial abnormality. Sinuses/Orbits: Visualized paranasal sinuses and mastoids clear. Orbital soft tissues unremarkable. Other: None IMPRESSION: Normal study. Electronically Signed   By: Charlett Nose M.D.   On: 04/13/2018 21:01   Dg Chest Portable 1 View  Result Date: 04/13/2018 CLINICAL DATA:  Chest pain EXAM: PORTABLE CHEST 1 VIEW COMPARISON:  04/07/2015 FINDINGS: Low lung volumes with bibasilar opacities, likely atelectasis. Cannot completely exclude infection/pneumonia. Heart is normal size. No effusions or acute bony abnormality. IMPRESSION: Low lung volumes with bibasilar atelectasis or infiltrates. Electronically Signed   By: Charlett Nose M.D.   On: 04/13/2018 20:13   Dg Hand Complete Left  Result Date: 04/13/2018 CLINICAL DATA:  Left thumb injury 2 weeks ago.  Pain. EXAM: LEFT HAND - COMPLETE 3+ VIEW COMPARISON:  03/31/2018 FINDINGS: There is no evidence of  fracture or dislocation. There is  no evidence of arthropathy or other focal bone abnormality. Soft tissues are unremarkable. IMPRESSION: Negative. Electronically Signed   By: Charlett Nose M.D.   On: 04/13/2018 20:14   Ct Angio Chest/abd/pel For Dissection W And/or Wo Contrast  Result Date: 04/13/2018 CLINICAL DATA:  Headache with chest and back pain. Cocaine use. EXAM: CT ANGIOGRAPHY CHEST, ABDOMEN AND PELVIS TECHNIQUE: Multidetector CT imaging through the chest, abdomen and pelvis was performed using the standard protocol during bolus administration of intravenous contrast. Multiplanar reconstructed images and MIPs were obtained and reviewed to evaluate the vascular anatomy. CONTRAST:  ISOVUE-370 IOPAMIDOL (ISOVUE-370) INJECTION 76% COMPARISON:  Standard CT chest 07/14/2011. CT abdomen/pelvis 04/03/2009 FINDINGS: CTA CHEST FINDINGS Cardiovascular: The heart size is normal. No substantial pericardial effusion. Precontrast imaging shows no hyperdense crescent in the wall of the thoracic aorta to suggest acute intramural hematoma. No thoracic aortic aneurysm. No evidence for intimal flap within the thoracic aorta to suggest dissection. No large central filling defect in the pulmonary arteries to suggest central pulmonary embolus. Mediastinum/Nodes: No mediastinal lymphadenopathy. There is no hilar lymphadenopathy. The esophagus has normal imaging features. There is no axillary lymphadenopathy. Lungs/Pleura: The central tracheobronchial airways are patent. Dependent atelectasis noted in the lower lungs bilaterally. No focal airspace consolidation. No pulmonary edema or pleural effusion. Musculoskeletal: No worrisome lytic or sclerotic osseous abnormality. Bilateral gynecomastia noted. Review of the MIP images confirms the above findings. CTA ABDOMEN AND PELVIS FINDINGS VASCULAR Aorta: Normal caliber without dissection flap. Celiac: Widely patent. SMA: Widely patent. Renals: Single right renal artery is widely patent. Single left renal artery  is widely patent. IMA: Patent. Inflow: Normal. Veins: Unopacified due to early bolus timing. Review of the MIP images confirms the above findings. NON-VASCULAR Hepatobiliary: No suspicious focal abnormality within the liver parenchyma. There is no evidence for gallstones, gallbladder wall thickening, or pericholecystic fluid. No intrahepatic or extrahepatic biliary dilation. Pancreas: No focal mass lesion. No dilatation of the main duct. No intraparenchymal cyst. No peripancreatic edema. Spleen: No splenomegaly. No focal mass lesion. Adrenals/Urinary Tract: No adrenal nodule or mass. Kidneys unremarkable. No evidence for hydroureter. Bladder is distended. Stomach/Bowel: Stomach is unremarkable. No gastric wall thickening. No evidence of outlet obstruction. Duodenum is normally positioned as is the ligament of Treitz. No small bowel wall thickening. No small bowel dilatation. The appendix is normal. No gross colonic mass. No colonic wall thickening. Lymphatic: There is no gastrohepatic or hepatoduodenal ligament lymphadenopathy. No intraperitoneal or retroperitoneal lymphadenopathy. No pelvic sidewall lymphadenopathy. Reproductive: The prostate gland and seminal vesicles are unremarkable. Other: No intraperitoneal free fluid. Musculoskeletal: No worrisome lytic or sclerotic osseous abnormality. Review of the MIP images confirms the above findings. IMPRESSION: 1. No thoracoabdominal aortic dissection. No thoraco abdominal aortic aneurysm. 2. No findings to explain the patient's history of chest and back pain. No acute findings in the chest, abdomen, or pelvis. Electronically Signed   By: Kennith Center M.D.   On: 04/13/2018 21:42    Procedures Procedures (including critical care time)  Medications Ordered in ED Medications  iopamidol (ISOVUE-370) 76 % injection (has no administration in time range)  fentaNYL (SUBLIMAZE) injection 100 mcg (100 mcg Intravenous Given 04/13/18 2005)  lactated ringers bolus 1,000  mL (1,000 mLs Intravenous New Bag/Given 04/13/18 2005)  iopamidol (ISOVUE-370) 76 % injection 100 mL (100 mLs Intravenous Contrast Given 04/13/18 2056)     Initial Impression / Assessment and Plan / ED Course  I have reviewed the triage vital signs and  the nursing notes.  Pertinent labs & imaging results that were available during my care of the patient were reviewed by me and considered in my medical decision making (see chart for details).        Patient has been resting comfortably since original presentation.  Vitals have been benign.  ECGs are unremarkable.  There is some early re-pole but no ischemic changes and these are steady throughout multiple ECGs.  Troponins are negative x2.  My suspicion for ACS is pretty low though I did discuss that cocaine is a significant risk factor for developing cardiac disease and he needs to stop.  He did develop a severe headache though it was less than his chest pain just prior to arrival and CT head is unremarkable.  This was obtained much less than 6 hours since headache onset and so I think subarachnoid hemorrhage is pretty unlikely.  Dissection study negative.  He appears stable for discharge home.  Final Clinical Impressions(s) / ED Diagnoses   Final diagnoses:  Cocaine abuse (HCC)  Chest pain in adult    ED Discharge Orders    None       Pricilla Loveless, MD 04/14/18 0003

## 2018-04-13 NOTE — Discharge Instructions (Signed)
If you develop recurrent, continued, or worsening chest pain, shortness of breath, fever, vomiting, abdominal or back pain, or any other new/concerning symptoms then return to the ER for evaluation.  

## 2018-04-13 NOTE — ED Triage Notes (Addendum)
Per [facility]- pt picked up from home, reports headache, chest pain, cocaine, and alcohol use today. Pt is aggressive stating that he doesn't want to be here, why didn't someone take me to [place]. Pt is non compliant. [employer] beside. PTA, Pt given 0.4nitro and 324 asa

## 2018-04-13 NOTE — ED Notes (Addendum)
Pt refused assistance undressing and measuring vital signs, stating "I'm hurting and I can't move this arm." Explained to pt several times assistance/comfort measures will be provided undressing. Pt then stated "Ma'am I don't belong here I was supposed to go to [place]..my lt arm is hurting and if you move it I will swing on you." No further questions asked or assistance offered; advised MD and RN of pt statement. [employer]

## 2018-04-23 ENCOUNTER — Emergency Department (HOSPITAL_COMMUNITY)
Admission: EM | Admit: 2018-04-23 | Discharge: 2018-04-23 | Disposition: A | Discharge status: Home / Self Care | Payer: Medicaid Other | Attending: Emergency Medicine | Admitting: Emergency Medicine

## 2018-04-23 ENCOUNTER — Other Ambulatory Visit: Payer: Self-pay

## 2018-04-23 ENCOUNTER — Emergency Department (HOSPITAL_COMMUNITY): Payer: Medicaid Other

## 2018-04-23 ENCOUNTER — Encounter (HOSPITAL_COMMUNITY): Payer: Self-pay | Admitting: Emergency Medicine

## 2018-04-23 ENCOUNTER — Ambulatory Visit: Payer: Medicaid Other | Admitting: Podiatry

## 2018-04-23 DIAGNOSIS — F209 Schizophrenia, unspecified: Secondary | ICD-10-CM | POA: Insufficient documentation

## 2018-04-23 DIAGNOSIS — F1420 Cocaine dependence, uncomplicated: Secondary | ICD-10-CM | POA: Insufficient documentation

## 2018-04-23 DIAGNOSIS — R440 Auditory hallucinations: Secondary | ICD-10-CM | POA: Diagnosis not present

## 2018-04-23 DIAGNOSIS — R45850 Homicidal ideations: Secondary | ICD-10-CM | POA: Diagnosis not present

## 2018-04-23 DIAGNOSIS — J45909 Unspecified asthma, uncomplicated: Secondary | ICD-10-CM | POA: Diagnosis not present

## 2018-04-23 DIAGNOSIS — R441 Visual hallucinations: Secondary | ICD-10-CM | POA: Diagnosis not present

## 2018-04-23 DIAGNOSIS — F1220 Cannabis dependence, uncomplicated: Secondary | ICD-10-CM | POA: Diagnosis not present

## 2018-04-23 DIAGNOSIS — R079 Chest pain, unspecified: Secondary | ICD-10-CM

## 2018-04-23 DIAGNOSIS — R45851 Suicidal ideations: Secondary | ICD-10-CM | POA: Diagnosis not present

## 2018-04-23 DIAGNOSIS — Z046 Encounter for general psychiatric examination, requested by authority: Secondary | ICD-10-CM | POA: Diagnosis present

## 2018-04-23 DIAGNOSIS — F250 Schizoaffective disorder, bipolar type: Secondary | ICD-10-CM | POA: Insufficient documentation

## 2018-04-23 DIAGNOSIS — F1910 Other psychoactive substance abuse, uncomplicated: Secondary | ICD-10-CM | POA: Diagnosis present

## 2018-04-23 LAB — CBC WITH DIFFERENTIAL/PLATELET
Abs Immature Granulocytes: 0.01 10*3/uL (ref 0.00–0.07)
Basophils Absolute: 0 10*3/uL (ref 0.0–0.1)
Basophils Relative: 0 %
Eosinophils Absolute: 0 10*3/uL (ref 0.0–0.5)
Eosinophils Relative: 0 %
HCT: 49.1 % (ref 39.0–52.0)
Hemoglobin: 15.5 g/dL (ref 13.0–17.0)
Immature Granulocytes: 0 %
Lymphocytes Relative: 18 %
Lymphs Abs: 1.3 10*3/uL (ref 0.7–4.0)
MCH: 30.5 pg (ref 26.0–34.0)
MCHC: 31.6 g/dL (ref 30.0–36.0)
MCV: 96.7 fL (ref 80.0–100.0)
Monocytes Absolute: 0.7 10*3/uL (ref 0.1–1.0)
Monocytes Relative: 9 %
Neutro Abs: 5.5 10*3/uL (ref 1.7–7.7)
Neutrophils Relative %: 73 %
Platelets: 177 10*3/uL (ref 150–400)
RBC: 5.08 MIL/uL (ref 4.22–5.81)
RDW: 12.1 % (ref 11.5–15.5)
WBC: 7.5 10*3/uL (ref 4.0–10.5)
nRBC: 0 % (ref 0.0–0.2)

## 2018-04-23 LAB — COMPREHENSIVE METABOLIC PANEL
ALT: 14 U/L (ref 0–44)
AST: 19 U/L (ref 15–41)
Albumin: 4.2 g/dL (ref 3.5–5.0)
Alkaline Phosphatase: 61 U/L (ref 38–126)
Anion gap: 7 (ref 5–15)
BUN: 10 mg/dL (ref 6–20)
CO2: 25 mmol/L (ref 22–32)
Calcium: 9.8 mg/dL (ref 8.9–10.3)
Chloride: 110 mmol/L (ref 98–111)
Creatinine, Ser: 1.07 mg/dL (ref 0.61–1.24)
GFR calc Af Amer: >60 mL/min (ref >60)
GFR calc non Af Amer: >60 mL/min (ref >60)
Glucose, Bld: 103 mg/dL — ABNORMAL HIGH (ref 70–99)
Potassium: 3.4 mmol/L — ABNORMAL LOW (ref 3.5–5.1)
Sodium: 142 mmol/L (ref 135–145)
Total Bilirubin: 1.5 mg/dL — ABNORMAL HIGH (ref 0.3–1.2)
Total Protein: 7 g/dL (ref 6.5–8.1)

## 2018-04-23 LAB — TROPONIN I
Troponin I: <0.03 ng/mL (ref <0.03)
Troponin I: <0.03 ng/mL (ref <0.03)

## 2018-04-23 LAB — ETHANOL: Alcohol, Ethyl (B): <10 mg/dL (ref <10)

## 2018-04-23 LAB — RAPID URINE DRUG SCREEN, HOSP PERFORMED
Amphetamines: NOT DETECTED
Barbiturates: NOT DETECTED
Benzodiazepines: NOT DETECTED
Cocaine: NOT DETECTED
Opiates: NOT DETECTED
Tetrahydrocannabinol: POSITIVE — AB

## 2018-04-23 LAB — SALICYLATE LEVEL: Salicylate Lvl: <7 mg/dL (ref 2.8–30.0)

## 2018-04-23 LAB — ACETAMINOPHEN LEVEL: Acetaminophen (Tylenol), Serum: <10 ug/mL — ABNORMAL LOW (ref 10–30)

## 2018-04-23 MED ORDER — ONDANSETRON HCL 4 MG PO TABS: 4.0000 mg | ORAL_TABLET | Freq: Three times a day (TID) | ORAL | Status: DC | PRN

## 2018-04-23 MED ORDER — ASPIRIN 81 MG PO CHEW
324.0000 mg | CHEWABLE_TABLET | Freq: Once | ORAL | Status: AC
  Administered 2018-04-23: 324 mg via ORAL
  Filled 2018-04-23: qty 4

## 2018-04-23 MED ORDER — CHLORPROMAZINE HCL 25 MG PO TABS
100.0000 mg | ORAL_TABLET | Freq: Every day | ORAL | Status: DC
  Administered 2018-04-23: 100 mg via ORAL
  Filled 2018-04-23: qty 4

## 2018-04-23 MED ORDER — LITHIUM CARBONATE 300 MG PO CAPS
300.0000 mg | ORAL_CAPSULE | Freq: Two times a day (BID) | ORAL | Status: DC
  Administered 2018-04-23: 300 mg via ORAL
  Filled 2018-04-23: qty 1

## 2018-04-23 MED ORDER — HYDROCHLOROTHIAZIDE 12.5 MG PO CAPS
12.5000 mg | ORAL_CAPSULE | Freq: Every day | ORAL | Status: DC
  Administered 2018-04-23: 12.5 mg via ORAL
  Filled 2018-04-23: qty 1

## 2018-04-23 MED ORDER — CHLORPROMAZINE HCL 50 MG PO TABS: 300.0000 mg | ORAL_TABLET | Freq: Every day | ORAL | Status: DC

## 2018-04-23 MED ORDER — POTASSIUM CHLORIDE CRYS ER 20 MEQ PO TBCR
40.0000 meq | EXTENDED_RELEASE_TABLET | Freq: Once | ORAL | Status: AC
  Administered 2018-04-23: 40 meq via ORAL
  Filled 2018-04-23: qty 2

## 2018-04-23 MED ORDER — TRAZODONE HCL 100 MG PO TABS: 100.0000 mg | ORAL_TABLET | Freq: Every evening | ORAL | Status: DC | PRN

## 2018-04-23 MED ORDER — BENZTROPINE MESYLATE 1 MG PO TABS
2.0000 mg | ORAL_TABLET | Freq: Two times a day (BID) | ORAL | Status: DC
  Administered 2018-04-23: 2 mg via ORAL
  Filled 2018-04-23: qty 2

## 2018-04-23 MED ORDER — HALOPERIDOL 5 MG PO TABS
5.0000 mg | ORAL_TABLET | Freq: Once | ORAL | Status: AC
  Administered 2018-04-23: 5 mg via ORAL
  Filled 2018-04-23: qty 1

## 2018-04-23 MED ORDER — NICOTINE 21 MG/24HR TD PT24
21.0000 mg | MEDICATED_PATCH | Freq: Every day | TRANSDERMAL | Status: DC
  Administered 2018-04-23: 21 mg via TRANSDERMAL
  Filled 2018-04-23: qty 1

## 2018-04-23 MED ORDER — ALUM & MAG HYDROXIDE-SIMETH 200-200-20 MG/5ML PO SUSP: 30.0000 mL | Freq: Four times a day (QID) | ORAL | Status: DC | PRN

## 2018-04-23 MED ORDER — OXCARBAZEPINE 300 MG PO TABS
600.0000 mg | ORAL_TABLET | Freq: Two times a day (BID) | ORAL | Status: DC
  Administered 2018-04-23: 600 mg via ORAL
  Filled 2018-04-23: qty 2

## 2018-04-23 MED ORDER — ACETAMINOPHEN 325 MG PO TABS: 650.0000 mg | ORAL_TABLET | ORAL | Status: DC | PRN

## 2018-04-23 NOTE — Discharge Instructions (Signed)
For your mental health needs, you are advised to continue treatment with [facility]: ° °     [facility] °     [address] °     , [place] [address] °     [phone] °

## 2018-04-23 NOTE — ED Provider Notes (Signed)
Patient seen after signout from prior ED provider.  Pending labs are complete.  Patient without evidence of acute medical pathology.  Patient is currently medically clear at this time for further psychiatric assessment and treatment.     Wynetta Fines, MD 04/23/18 0900

## 2018-04-23 NOTE — ED Provider Notes (Signed)
[facility]-EMERGENCY DEPT Provider Note   CSN: [id number] Arrival date & time: 04/23/18  0234    History   Chief Complaint Chief Complaint  Patient presents with  . Chest Pain    HPI [name] [name] is a 43 y.o. male.   The history is provided by the patient.  Chest Pain  He has history of schizoaffective disorder, polysubstance abuse, homelessness and was brought in by police because of chest pain.  He admits to using 7 g of cocaine tonight and also drinking a large amount.  He tells me that he has not taken his medications in the last 2 months and has been having auditory command hallucinations telling him to kill himself.  He states that if he is released from here, he will go to jail and be released in the morning and will go home at which point he will take a handful of sleeping pills and lay down in a tub so that when he goes to sleep he goes under the water and drowns.  He is asking for evaluation by a psychiatrist.  He also admits to some visual hallucinations.  He also expresses homicidal ideation and wanting to kill the police officers who brought him in.  Past Medical History:  Diagnosis Date  . Aggression   . Alcohol abuse   . Asthma   . Homeless   . Hypertension   . Schizophrenia [facility])     Patient Active Problem List   Diagnosis Date Noted  . Cocaine abuse (HCC) 10/04/2015  . Suicidal ideation 09/21/2015  . Cannabis use disorder, severe, dependence (HCC) 06/18/2015  . Behavior disturbance   . Schizoaffective disorder, bipolar type (HCC) 04/28/2015  . Adult antisocial behavior 04/04/2015  . Polysubstance abuse (HCC) 04/04/2015  . Homicidal ideation 10/16/2014  . Obstructive sleep apnea 10/16/2014    Past Surgical History:  Procedure Laterality Date  . KNEE SURGERY    . MOUTH SURGERY          Home Medications    Prior to Admission medications   Medication Sig Start Date End Date Taking? Authorizing Provider  benztropine  (COGENTIN) 1 MG tablet Take 1 tablet (1 mg total) by mouth 2 (two) times daily. Patient not taking: Reported on 02/07/2018 02/18/16   Rolland Porter, MD  benztropine (COGENTIN) 1 MG tablet Take 1 tablet (1 mg total) by mouth 2 (two) times daily. Patient taking differently: Take 2 mg by mouth 2 (two) times daily.  05/19/17   Samuel Jester, DO  chlorproMAZINE (THORAZINE) 100 MG tablet 100 mg every morning. 300 Mg every evening Patient not taking: Reported on 02/07/2018 02/18/16   Rolland Porter, MD  chlorproMAZINE (THORAZINE) 100 MG tablet Take 1 tablet (100 mg total) by mouth every morning AND 3 tablets (300 mg total) every evening. 05/19/17   Samuel Jester, DO  clotrimazole (LOTRIMIN) 1 % external solution Apply 1 application topically 2 (two) times daily. To toenails 01/22/18   Asencion Islam, DPM  diphenhydrAMINE (BENADRYL) 25 MG tablet Take 1 tablet (25 mg total) by mouth every 6 (six) hours. Patient not taking: Reported on 02/07/2018 09/28/15   Emi Holes, PA-C  haloperidol (HALDOL) 10 MG tablet Take 1 tablet (10 mg total) by mouth 2 (two) times daily. Patient not taking: Reported on 02/07/2018 02/18/16   Rolland Porter, MD  haloperidol (HALDOL) 10 MG tablet Take 10 mg by mouth 2 (two) times daily.    [provider]  haloperidol (HALDOL) 2 MG tablet Take 1  tablet (2 mg total) by mouth 2 (two) times daily. Patient not taking: Reported on 02/20/2018 05/19/17   Samuel Jester, DO  haloperidol decanoate (HALDOL DECANOATE) 100 MG/ML injection Inject 200 mg into the muscle every 28 (twenty-eight) days.     [provider]  hydrochlorothiazide (MICROZIDE) 12.5 MG capsule Take 1 capsule (12.5 mg total) by mouth daily. Patient not taking: Reported on 02/07/2018 09/11/15   Kristeen Mans, NP  hydrocortisone cream 1 % Apply to affected area 2 times daily Patient not taking: Reported on 02/07/2018 09/28/15   Emi Holes, PA-C  ibuprofen (ADVIL,MOTRIN) 800 MG tablet Take 1 tablet  (800 mg total) by mouth every 8 (eight) hours as needed. 03/31/18   Tilden Fossa, MD  lithium 300 MG tablet Take 1 tablet (300 mg total) by mouth 3 (three) times daily. Patient not taking: Reported on 02/07/2018 02/18/16   Rolland Porter, MD  lithium 300 MG tablet Take 1 tablet (300 mg total) by mouth 3 (three) times daily. Patient taking differently: Take 300 mg by mouth 2 (two) times daily.  05/19/17   Samuel Jester, DO  oxcarbazepine (TRILEPTAL) 600 MG tablet Take 1 tablet (600 mg total) by mouth 2 (two) times daily. Patient not taking: Reported on 02/07/2018 02/18/16   Rolland Porter, MD  oxcarbazepine (TRILEPTAL) 600 MG tablet Take 1 tablet (600 mg total) by mouth 2 (two) times daily. 05/19/17   Samuel Jester, DO  traZODone (DESYREL) 100 MG tablet Take 1 tablet (100 mg total) by mouth at bedtime. Patient not taking: Reported on 02/07/2018 09/11/15   Kristeen Mans, NP  traZODone (DESYREL) 100 MG tablet Take 100 mg by mouth at bedtime as needed for sleep.    [provider]  ziprasidone (GEODON) 60 MG capsule Take 1 capsule (60 mg total) by mouth 2 (two) times daily with a meal. Patient not taking: Reported on 02/07/2018 09/11/15   Kristeen Mans, NP    Family History No family history on file.  Social History Social History   Tobacco Use  . Smoking status: Current Every Day Smoker  . Smokeless tobacco: Never Used  Substance Use Topics  . Alcohol use: Yes  . Drug use: Yes    Types: Cocaine, Marijuana, Hydrocodone, MDMA (Ecstacy)    Comment: former     Allergies   Prunus persica; Pseudoephedrine hcl; Sudafed [pseudoephedrine]; and Food   Review of Systems Review of Systems  Cardiovascular: Positive for chest pain.  All other systems reviewed and are negative.    Physical Exam Updated Vital Signs BP (!) 130/92   Pulse 75   Temp 98.4 F (36.9 C) (Oral)   Resp 12   Ht 6' (1.829 m)   Wt 111.1 kg   SpO2 99%   BMI 33.22 kg/m   Physical Exam Vitals signs  and nursing note reviewed.    43 year old male, resting comfortably and in no acute distress. Vital signs are significant for borderline elevated blood pressure. Oxygen saturation is 99%, which is normal. Head is normocephalic and atraumatic. PERRLA, EOMI. Oropharynx is clear. Neck is nontender and supple without adenopathy or JVD. Back is nontender and there is no CVA tenderness. Lungs are clear without rales, wheezes, or rhonchi. Chest is nontender. Heart has regular rate and rhythm without murmur. Abdomen is soft, flat, nontender without masses or hepatosplenomegaly and peristalsis is normoactive. Extremities have no cyanosis or edema, full range of motion is present. Skin is warm and dry without rash. Neurologic: Mental status  is normal, cranial nerves are intact, there are no motor or sensory deficits.  ED Treatments / Results  Labs (all labs ordered are listed, but only abnormal results are displayed) Labs Reviewed  COMPREHENSIVE METABOLIC PANEL - Abnormal; Notable for the following components:      Result Value   Potassium 3.4 (*)    Glucose, Bld 103 (*)    Total Bilirubin 1.5 (*)    All other components within normal limits  ACETAMINOPHEN LEVEL - Abnormal; Notable for the following components:   Acetaminophen (Tylenol), Serum <10 (*)    All other components within normal limits  ETHANOL  TROPONIN I  CBC WITH DIFFERENTIAL/PLATELET  SALICYLATE LEVEL  RAPID URINE DRUG SCREEN, HOSP PERFORMED  TROPONIN I    EKG EKG Interpretation  Date/Time:  Tuesday April 23 2018 03:21:17 EST Ventricular Rate:  83 PR Interval:    QRS Duration: 86 QT Interval:  350 QTC Calculation: 412 R Axis:   36 Text Interpretation:  Sinus rhythm Consider right atrial enlargement Early repolarization When compared with ECG of 04/13/2018, No significant change was found Confirmed by Dione Booze ([id number]) on 04/23/2018 3:51:53 AM Also confirmed by Dione Booze ([id number]), editor Barbette Hair [phone])  on  04/23/2018 7:10:21 AM   Radiology Dg Chest Port 1 View  Result Date: 04/23/2018 CLINICAL DATA:  Sudden onset of right-sided chest pain. EXAM: PORTABLE CHEST 1 VIEW COMPARISON:  Chest radiograph and chest CTA 04/13/2018 FINDINGS: Low lung volumes persist. Mild right greater than left basilar atelectasis. No confluent airspace disease. Heart size normal for technique. No large pleural effusion or pneumothorax. Artifact projects over the included neck. IMPRESSION: Low lung volumes with bibasilar atelectasis. Electronically Signed   By: Narda Rutherford M.D.   On: 04/23/2018 03:17    Procedures Procedures   Medications Ordered in ED Medications  aspirin chewable tablet 324 mg (has no administration in time range)     Initial Impression / Assessment and Plan / ED Course  I have reviewed the triage vital signs and the nursing notes.  Pertinent labs & imaging results that were available during my care of the patient were reviewed by me and considered in my medical decision making (see chart for details).  Cocaine induced chest pain.  Medication noncompliance.  Suicidal ideation.  Old records are reviewed, and he has multiple ED visits for cocaine abuse and psychiatric issues.  ECG obtained by EMS showed no acute changes.  He will need to have troponin checked here, and delta troponin checked as well.  We will ask for TTS consultation.  Initial troponin is normal.  Chest x-ray shows no acute process.  ECG in the ED shows no acute process.  TTS consultation is appreciated.  Patient is being held for psychiatric evaluation in the morning.  At this time, repeat troponin is also pending.  Case is signed out to Dr. Rodena Medin.  Of note, patient is now requesting to be sent to [facility].  Final Clinical Impressions(s) / ED Diagnoses   Final diagnoses:  Chest pain, unspecified type  Suicidal ideation    ED Discharge Orders    None       Dione Booze, MD 04/23/18 [phone]

## 2018-04-23 NOTE — ED Notes (Signed)
Discharge paperwork reviewed with pt.  Pt taken into police custody, pt stated he did not want paperwork.  Pt began to become agitated, stating "I need help, I need help.  Y'all just keep discharging me, I'm going to go home and do what I said I'm going to do."  This RN informed him that psychiatrist had seen pt and had deemed him appropriate for discharge.  Pt discharged in police custody, discharge paperwork given to police officer present.

## 2018-04-23 NOTE — ED Triage Notes (Signed)
Patient would not allow this writer to get vital signs. Patient is combative and agitated. This writer will attempt to get VS again once patient calms down. [employer] is outside of patients room talking with patient.

## 2018-04-23 NOTE — ED Notes (Signed)
This RN introduced self to pt, pt seen sitting on bed using bedside phone.  Pt calm, smiling.  Pt stated he his waiting on psychiatrist to come see him about "going to [place]."  Will continue to monitor.
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
## 2018-04-23 NOTE — BHH Suicide Risk Assessment (Signed)
[facility] Discharge Suicide Risk Assessment   Principal Problem: Polysubstance abuse [name]) Discharge Diagnoses: Principal Problem:   Polysubstance abuse (HCC)   Total Time spent with patient: 30 minutes  Musculoskeletal: Strength & Muscle Tone: within normal limits Gait & Station: normal Patient leans: N/A  Psychiatric Specialty Exam: Review of Systems  Psychiatric/Behavioral: Positive for substance abuse and suicidal ideas.  All other systems reviewed and are negative.   Blood pressure (!) 130/91, pulse 94, temperature 98.4 F (36.9 C), temperature source Oral, resp. rate 16, height 6' (1.829 m), weight 111.1 kg, SpO2 96 %.Body mass index is 33.22 kg/m.  General Appearance: Fairly Groomed, middle aged, African American male, wearing paper hospital scrubs who is lying in bed. NAD.   Eye Contact::  Good  Speech:  Clear and Coherent and Normal Rate  Volume:  Normal  Mood:  Depressed  Affect:  Non-Congruent and Full Range  Thought Process:  Linear and Descriptions of Associations: Intact  Orientation:  Full (Time, Place, and Person)  Thought Content:  Logical  Suicidal Thoughts:  Yes.  without intent/plan  Homicidal Thoughts:  No  Memory:  Immediate;   Good Recent;   Good Remote;   Good  Judgement:  Fair  Insight:  Fair  Psychomotor Activity:  Normal  Concentration:  Good  Recall:  Good  Fund of Knowledge:Good  Language: Good  Akathisia:  No  Handed:  Right  AIMS (if indicated):   N/A  Assets:  Communication Skills Desire for Improvement Housing Physical Health Resilience Social Support  Sleep:   N/A  Cognition: WNL  ADL's:  Intact   Mental Status Per Nursing Assessment::   On Admission:    "Patient is calm and cooperative during assessment.  He says he has lost weight from being out on the street using drugs.  His current weight was 188 lbs but he does not know how much he weighed before this.  He has good eye contact.  He still complains of some chest pains, likely  due to cocaine ingestion."  Demographic Factors:  Male and Low socioeconomic status  Loss Factors: Financial problems/change in socioeconomic status  Historical Factors: Impulsivity  Risk Reduction Factors:   Living with another person, especially a relative, Positive social support and Positive therapeutic relationship  Continued Clinical Symptoms:  Alcohol/Substance Abuse/Dependencies More than one psychiatric diagnosis Previous Psychiatric Diagnoses and Treatments  Cognitive Features That Contribute To Risk:  None    Suicide Risk:  Minimal: No identifiable suicidal ideation.  Patients presenting with no risk factors but with morbid ruminations; may be classified as minimal risk based on the severity of the depressive symptoms  Assessment:  [name] [name] is a 43 y.o. male who was admitted under police custody. He reportedly endorsed thoughts to harm self when he was told that he was under arrest. He endorses SI and appears to be malingering for secondary gain to avoid going to jail. He requests long term psychiatric care. He reports using cocaine prior to admission although UDS is only positive for THC. He has no evidence of a psychiatric condition which is amendable to inpatient psychiatric hospitalization. His behavior is more consistent with someone who is acting in self-preservation, rather than someone who is acutely suicidal. He does not warrant inpatient psychiatric hospitalization at this time. He will likely benefit from substance abuse treatment due to problematic use.     Plan Of Care/Follow-up recommendations:  -Continue home psychotropic medications.  -Discharge to police custody.   Cherly Beach, DO 04/23/2018,  11:03 AM

## 2018-04-23 NOTE — BH Assessment (Signed)
BHH Assessment Progress Note  Per Jacqueline Norman, DO, this pt does not require psychiatric hospitalization at this time.  Pt is to be discharged from [facility] with recommendation to continue treatment with [facility].  This has been included in pt's discharge instructions.  Pt's nurse has been notified.  Milo Solana, MA Triage Specialist [phone]     

## 2018-04-23 NOTE — ED Notes (Signed)
Bed: XO32 Expected date:  Expected time:  Means of arrival:  Comments: EMS: Chest Pain

## 2018-04-23 NOTE — ED Triage Notes (Signed)
Patient arrived by EMS was picked up at jail. Pt c/o chest pain. Pt stated that he took 7g of cocaine. Pt has RT sided chest pain radiating into shoulder.   BP 181/102, SPO2 99% on RA, RR 20.  EMS gave 324 mg of Aspirin.

## 2018-04-23 NOTE — ED Notes (Signed)
Pt provided breakfast tray. Pt calm and cooperative.

## 2018-10-07 ENCOUNTER — Encounter (HOSPITAL_COMMUNITY): Payer: Self-pay

## 2018-10-07 ENCOUNTER — Other Ambulatory Visit: Payer: Self-pay

## 2018-10-07 ENCOUNTER — Emergency Department (HOSPITAL_COMMUNITY): Payer: Medicaid Other

## 2018-10-07 ENCOUNTER — Emergency Department (HOSPITAL_COMMUNITY)
Admission: EM | Admit: 2018-10-07 | Discharge: 2018-10-07 | Disposition: A | Discharge status: Home / Self Care | Payer: Medicaid Other | Attending: Emergency Medicine | Admitting: Emergency Medicine

## 2018-10-07 DIAGNOSIS — J069 Acute upper respiratory infection, unspecified: Secondary | ICD-10-CM | POA: Insufficient documentation

## 2018-10-07 DIAGNOSIS — I10 Essential (primary) hypertension: Secondary | ICD-10-CM | POA: Diagnosis not present

## 2018-10-07 DIAGNOSIS — F17200 Nicotine dependence, unspecified, uncomplicated: Secondary | ICD-10-CM | POA: Diagnosis not present

## 2018-10-07 DIAGNOSIS — J45909 Unspecified asthma, uncomplicated: Secondary | ICD-10-CM | POA: Insufficient documentation

## 2018-10-07 DIAGNOSIS — Z20828 Contact with and (suspected) exposure to other viral communicable diseases: Secondary | ICD-10-CM | POA: Diagnosis not present

## 2018-10-07 DIAGNOSIS — Z79899 Other long term (current) drug therapy: Secondary | ICD-10-CM | POA: Diagnosis not present

## 2018-10-07 DIAGNOSIS — R0602 Shortness of breath: Secondary | ICD-10-CM | POA: Diagnosis present

## 2018-10-07 LAB — CBC WITH DIFFERENTIAL/PLATELET
Abs Immature Granulocytes: 0.02 10*3/uL (ref 0.00–0.07)
Basophils Absolute: 0.1 10*3/uL (ref 0.0–0.1)
Basophils Relative: 1 %
Eosinophils Absolute: 0.1 10*3/uL (ref 0.0–0.5)
Eosinophils Relative: 1 %
HCT: 50.2 % (ref 39.0–52.0)
Hemoglobin: 16.1 g/dL (ref 13.0–17.0)
Immature Granulocytes: 0 %
Lymphocytes Relative: 17 %
Lymphs Abs: 1.2 10*3/uL (ref 0.7–4.0)
MCH: 30.5 pg (ref 26.0–34.0)
MCHC: 32.1 g/dL (ref 30.0–36.0)
MCV: 95.1 fL (ref 80.0–100.0)
Monocytes Absolute: 0.5 10*3/uL (ref 0.1–1.0)
Monocytes Relative: 7 %
Neutro Abs: 5.3 10*3/uL (ref 1.7–7.7)
Neutrophils Relative %: 74 %
Platelets: 291 10*3/uL (ref 150–400)
RBC: 5.28 MIL/uL (ref 4.22–5.81)
RDW: 11.7 % (ref 11.5–15.5)
WBC: 7.1 10*3/uL (ref 4.0–10.5)
nRBC: 0 % (ref 0.0–0.2)

## 2018-10-07 LAB — BASIC METABOLIC PANEL
Anion gap: 9 (ref 5–15)
BUN: 13 mg/dL (ref 6–20)
CO2: 28 mmol/L (ref 22–32)
Calcium: 10.1 mg/dL (ref 8.9–10.3)
Chloride: 106 mmol/L (ref 98–111)
Creatinine, Ser: 1.17 mg/dL (ref 0.61–1.24)
GFR calc Af Amer: >60 mL/min (ref >60)
GFR calc non Af Amer: >60 mL/min (ref >60)
Glucose, Bld: 97 mg/dL (ref 70–99)
Potassium: 3.6 mmol/L (ref 3.5–5.1)
Sodium: 143 mmol/L (ref 135–145)

## 2018-10-07 LAB — URINALYSIS, ROUTINE W REFLEX MICROSCOPIC
Bacteria, UA: NONE SEEN
Bilirubin Urine: NEGATIVE
Glucose, UA: NEGATIVE mg/dL
Hgb urine dipstick: NEGATIVE
Ketones, ur: NEGATIVE mg/dL
Nitrite: NEGATIVE
Protein, ur: NEGATIVE mg/dL
Specific Gravity, Urine: 1.017 (ref 1.005–1.030)
WBC, UA: >50 WBC/hpf — ABNORMAL HIGH (ref 0–5)
pH: 5 (ref 5.0–8.0)

## 2018-10-07 LAB — SARS CORONAVIRUS 2 (TAT 6-24 HRS): SARS Coronavirus 2: NEGATIVE

## 2018-10-07 MED ORDER — SODIUM CHLORIDE 0.9 % IV BOLUS
1000.0000 mL | Freq: Once | INTRAVENOUS | Status: AC
  Administered 2018-10-07: 14:00:00 1000 mL via INTRAVENOUS

## 2018-10-07 NOTE — ED Provider Notes (Signed)
[facility] DEPT Provider Note   CSN: [id number] Arrival date & time: 10/07/18  1226     History   Chief Complaint Chief Complaint  Patient presents with  . Wants COVID Test    HPI [name] [name] is a 43 y.o. male.     Patient is a 43 year old male with past medical history of schizophrenia, hypertension, asthma, and violent behavior.  He presents today for evaluation of shortness of breath.  Patient states he was exposed to another individual who was recently diagnosed with COVID-19.  Patient states that he has a cough and congestion and feels short of breath when he ambulates short distances.  He also states that he has had no appetite and has had very little to eat over the past week.  He describes dark urine.  He denies fevers or chills.  The history is provided by the patient.    Past Medical History:  Diagnosis Date  . Aggression   . Alcohol abuse   . Asthma   . Homeless   . Hypertension   . Schizophrenia [facility])     Patient Active Problem List   Diagnosis Date Noted  . Cocaine abuse ([place]) 10/04/2015  . Suicidal ideation 09/21/2015  . Cannabis use disorder, severe, dependence ([place]) 06/18/2015  . Behavior disturbance   . Schizoaffective disorder, bipolar type ([place]) 04/28/2015  . Adult antisocial behavior 04/04/2015  . Polysubstance abuse ([place]) 04/04/2015  . Homicidal ideation 10/16/2014  . Obstructive sleep apnea 10/16/2014    Past Surgical History:  Procedure Laterality Date  . KNEE SURGERY    . MOUTH SURGERY          Home Medications    Prior to Admission medications   Medication Sig Start Date End Date Taking? Authorizing Provider  benztropine (COGENTIN) 1 MG tablet Take 1 tablet (1 mg total) by mouth 2 (two) times daily. Patient taking differently: Take 2 mg by mouth 2 (two) times daily.  05/19/17   Francine Graven, DO  chlorproMAZINE (THORAZINE) 100 MG tablet Take 1 tablet (100 mg total) by mouth every morning  AND 3 tablets (300 mg total) every evening. 05/19/17   Francine Graven, DO  haloperidol (HALDOL) 10 MG tablet Take 10 mg by mouth 2 (two) times daily.    [provider]  haloperidol decanoate (HALDOL DECANOATE) 100 MG/ML injection Inject 200 mg into the muscle every 28 (twenty-eight) days.     [provider]  ibuprofen (ADVIL,MOTRIN) 800 MG tablet Take 1 tablet (800 mg total) by mouth every 8 (eight) hours as needed. 03/31/18   Quintella Reichert, MD  lithium 300 MG tablet Take 1 tablet (300 mg total) by mouth 3 (three) times daily. Patient taking differently: Take 300 mg by mouth 2 (two) times daily.  05/19/17   Francine Graven, DO  oxcarbazepine (TRILEPTAL) 600 MG tablet Take 1 tablet (600 mg total) by mouth 2 (two) times daily. 05/19/17   Francine Graven, DO  traZODone (DESYREL) 100 MG tablet Take 100 mg by mouth at bedtime as needed for sleep.    [provider]    Family History No family history on file.  Social History Social History   Tobacco Use  . Smoking status: Current Every Day Smoker  . Smokeless tobacco: Never Used  Substance Use Topics  . Alcohol use: Yes  . Drug use: Yes    Types: Cocaine, Marijuana, Hydrocodone, MDMA (Ecstacy)    Comment: former     Allergies   Prunus persica, Pseudoephedrine  hcl, Sudafed [pseudoephedrine], and Food   Review of Systems Review of Systems  All other systems reviewed and are negative.    Physical Exam Updated Vital Signs BP (!) 127/96   Pulse 97   Temp 98.3 F (36.8 C) (Oral)   Resp 16   Wt 111 kg   SpO2 98%   BMI 33.19 kg/m   Physical Exam Vitals signs and nursing note reviewed.  Constitutional:      General: He is not in acute distress.    Appearance: He is well-developed. He is not ill-appearing, toxic-appearing or diaphoretic.  HENT:     Head: Normocephalic and atraumatic.     Nose: No congestion.     Mouth/Throat:     Mouth: Mucous membranes are moist.     Pharynx: No  oropharyngeal exudate.  Neck:     Musculoskeletal: Normal range of motion and neck supple.  Cardiovascular:     Rate and Rhythm: Normal rate and regular rhythm.     Heart sounds: No murmur. No friction rub.  Pulmonary:     Effort: Pulmonary effort is normal. No respiratory distress.     Breath sounds: Normal breath sounds. No wheezing or rales.  Abdominal:     General: Bowel sounds are normal. There is no distension.     Palpations: Abdomen is soft.     Tenderness: There is no abdominal tenderness.  Musculoskeletal: Normal range of motion.  Skin:    General: Skin is warm and dry.  Neurological:     Mental Status: He is alert and oriented to person, place, and time.     Coordination: Coordination normal.      ED Treatments / Results  Labs (all labs ordered are listed, but only abnormal results are displayed) Labs Reviewed  SARS CORONAVIRUS 2  BASIC METABOLIC PANEL  CBC WITH DIFFERENTIAL/PLATELET  URINALYSIS, ROUTINE W REFLEX MICROSCOPIC    EKG None  Radiology No results found.  Procedures Procedures (including critical care time)  Medications Ordered in ED Medications  sodium chloride 0.9 % bolus 1,000 mL (has no administration in time range)     Initial Impression / Assessment and Plan / ED Course  I have reviewed the triage vital signs and the nursing notes.  Pertinent labs & imaging results that were available during my care of the patient were reviewed by me and considered in my medical decision making (see chart for details).  Patient presenting here with complaints of cough and feeling short of breath.  He states he was exposed to an acquaintance who was diagnosed with COVID-19.  Patient's presentation and exam is inconsistent with this.  He also tells me that he feels dehydrated and has not been able to eat or drink for the past week.  He appears well-hydrated and laboratory studies are unremarkable.  Patient was given a liter of saline.  COVID-19 swab  will be obtained and sent out.  At this point, I feel as though patient is appropriate for discharge.  His vitals are stable and there is no hypoxia.  He is in no respiratory distress.  He will be advised to quarantine at home and follow-up with primary doctor.  Final Clinical Impressions(s) / ED Diagnoses   Final diagnoses:  None    ED Discharge Orders    None       Geoffery Lyonselo, Luvina Poirier, MD 10/07/18 1521

## 2018-10-07 NOTE — ED Triage Notes (Signed)
Pt BIBA from home. Pt c/o COVID symptoms. Pt states SHOB, cough. Pt is well appearing and clear lung sounds. NAD. Pt states he came in contact with a + COVID person on Thursday.

## 2018-10-07 NOTE — ED Notes (Signed)
This RN completed COVID swab, and patient became angry. Patient stormed out of room and tried to leave. Followed by security. Patient returned to room shortly thereafter.

## 2018-10-07 NOTE — Discharge Instructions (Addendum)
Drink plenty of fluids and get plenty of rest.  We will call you if your COVID test returns positive.  In the meantime, isolate yourself from others and remain at home.      Person Under Monitoring Name: [name] [name]  Location: [place] [address]   Infection Prevention Recommendations for Individuals Confirmed to have, or Being Evaluated for, 2019 Novel Coronavirus (COVID-19) Infection Who Receive Care at Home  Individuals who are confirmed to have, or are being evaluated for, COVID-19 should follow the prevention steps below until a healthcare provider or local or state health department says they can return to normal activities.  Stay home except to get medical care You should restrict activities outside your home, except for getting medical care. Do not go to work, school, or public areas, and do not use public transportation or taxis.  Call ahead before visiting your doctor Before your medical appointment, call the healthcare provider and tell them that you have, or are being evaluated for, COVID-19 infection. This will help the healthcare providers office take steps to keep other people from getting infected. Ask your healthcare provider to call the local or state health department.  Monitor your symptoms Seek prompt medical attention if your illness is worsening (e.g., difficulty breathing). Before going to your medical appointment, call the healthcare provider and tell them that you have, or are being evaluated for, COVID-19 infection. Ask your healthcare provider to call the local or state health department.  Wear a facemask You should wear a facemask that covers your nose and mouth when you are in the same room with other people and when you visit a healthcare provider. People who live with or visit you should also wear a facemask while they are in the same room with you.  Separate yourself from other people in your home As much as possible, you  should stay in a different room from other people in your home. Also, you should use a separate bathroom, if available.  Avoid sharing household items You should not share dishes, drinking glasses, cups, eating utensils, towels, bedding, or other items with other people in your home. After using these items, you should wash them thoroughly with soap and water.  Cover your coughs and sneezes Cover your mouth and nose with a tissue when you cough or sneeze, or you can cough or sneeze into your sleeve. Throw used tissues in a lined trash can, and immediately wash your hands with soap and water for at least 20 seconds or use an alcohol-based hand rub.  Wash your [facility] your hands often and thoroughly with soap and water for at least 20 seconds. You can use an alcohol-based hand sanitizer if soap and water are not available and if your hands are not visibly dirty. Avoid touching your eyes, nose, and mouth with unwashed hands.   Prevention Steps for Caregivers and Household Members of Individuals Confirmed to have, or Being Evaluated for, COVID-19 Infection Being Cared for in the Home  If you live with, or provide care at home for, a person confirmed to have, or being evaluated for, COVID-19 infection please follow these guidelines to prevent infection:  Follow healthcare providers instructions Make sure that you understand and can help the patient follow any healthcare provider instructions for all care.  Provide for the patients basic needs You should help the patient with basic needs in the home and provide support for getting groceries, prescriptions, and other personal needs.  Monitor the  patients symptoms If they are getting sicker, call his or her medical provider and tell them that the patient has, or is being evaluated for, COVID-19 infection. This will help the healthcare providers office take steps to keep other people from getting infected. Ask the healthcare provider  to call the local or state health department.  Limit the number of people who have contact with the patient If possible, have only one caregiver for the patient. Other household members should stay in another home or place of residence. If this is not possible, they should stay in another room, or be separated from the patient as much as possible. Use a separate bathroom, if available. Restrict visitors who do not have an essential need to be in the home.  Keep older adults, very young children, and other sick people away from the patient Keep older adults, very young children, and those who have compromised immune systems or chronic health conditions away from the patient. This includes people with chronic heart, lung, or kidney conditions, diabetes, and cancer.  Ensure good ventilation Make sure that shared spaces in the home have good air flow, such as from an air conditioner or an opened window, weather permitting.  Wash your hands often Wash your hands often and thoroughly with soap and water for at least 20 seconds. You can use an alcohol based hand sanitizer if soap and water are not available and if your hands are not visibly dirty. Avoid touching your eyes, nose, and mouth with unwashed hands. Use disposable paper towels to dry your hands. If not available, use dedicated cloth towels and replace them when they become wet.  Wear a facemask and gloves Wear a disposable facemask at all times in the room and gloves when you touch or have contact with the patients blood, body fluids, and/or secretions or excretions, such as sweat, saliva, sputum, nasal mucus, vomit, urine, or feces.  Ensure the mask fits over your nose and mouth tightly, and do not touch it during use. Throw out disposable facemasks and gloves after using them. Do not reuse. Wash your hands immediately after removing your facemask and gloves. If your personal clothing becomes contaminated, carefully remove clothing and  launder. Wash your hands after handling contaminated clothing. Place all used disposable facemasks, gloves, and other waste in a lined container before disposing them with other household waste. Remove gloves and wash your hands immediately after handling these items.  Do not share dishes, glasses, or other household items with the patient Avoid sharing household items. You should not share dishes, drinking glasses, cups, eating utensils, towels, bedding, or other items with a patient who is confirmed to have, or being evaluated for, COVID-19 infection. After the person uses these items, you should wash them thoroughly with soap and water.  Wash laundry thoroughly Immediately remove and wash clothes or bedding that have blood, body fluids, and/or secretions or excretions, such as sweat, saliva, sputum, nasal mucus, vomit, urine, or feces, on them. Wear gloves when handling laundry from the patient. Read and follow directions on labels of laundry or clothing items and detergent. In general, wash and dry with the warmest temperatures recommended on the label.  Clean all areas the individual has used often Clean all touchable surfaces, such as counters, tabletops, doorknobs, bathroom fixtures, toilets, phones, keyboards, tablets, and bedside tables, every day. Also, clean any surfaces that may have blood, body fluids, and/or secretions or excretions on them. Wear gloves when cleaning surfaces the patient has come in  contact with. Use a diluted bleach solution (e.g., dilute bleach with 1 part bleach and 10 parts water) or a household disinfectant with a label that says EPA-registered for coronaviruses. To make a bleach solution at home, add 1 tablespoon of bleach to 1 quart (4 cups) of water. For a larger supply, add  cup of bleach to 1 gallon (16 cups) of water. Read labels of cleaning products and follow recommendations provided on product labels. Labels contain instructions for safe and effective  use of the cleaning product including precautions you should take when applying the product, such as wearing gloves or eye protection and making sure you have good ventilation during use of the product. Remove gloves and wash hands immediately after cleaning.  Monitor yourself for signs and symptoms of illness Caregivers and household members are considered close contacts, should monitor their health, and will be asked to limit movement outside of the home to the extent possible. Follow the monitoring steps for close contacts listed on the symptom monitoring form.   ? If you have additional questions, contact your local health department or call the epidemiologist on call at [phone] (available 24/7). ? This guidance is subject to change. For the most up-to-date guidance from [facility], please refer to their website: TripMetro.huhttps://www.cdc.gov/coronavirus/2019-ncov/hcp/guidance-prevent-spread.html

## 2019-01-05 ENCOUNTER — Emergency Department (HOSPITAL_COMMUNITY): Payer: Medicaid Other

## 2019-01-05 ENCOUNTER — Emergency Department (HOSPITAL_COMMUNITY)
Admission: EM | Admit: 2019-01-05 | Discharge: 2019-01-05 | Disposition: A | Discharge status: Home / Self Care | Payer: Medicaid Other | Attending: Emergency Medicine | Admitting: Emergency Medicine

## 2019-01-05 ENCOUNTER — Other Ambulatory Visit: Payer: Self-pay

## 2019-01-05 DIAGNOSIS — I10 Essential (primary) hypertension: Secondary | ICD-10-CM | POA: Diagnosis not present

## 2019-01-05 DIAGNOSIS — Y929 Unspecified place or not applicable: Secondary | ICD-10-CM | POA: Insufficient documentation

## 2019-01-05 DIAGNOSIS — S29019A Strain of muscle and tendon of unspecified wall of thorax, initial encounter: Secondary | ICD-10-CM

## 2019-01-05 DIAGNOSIS — F17200 Nicotine dependence, unspecified, uncomplicated: Secondary | ICD-10-CM | POA: Diagnosis not present

## 2019-01-05 DIAGNOSIS — Z590 Homelessness: Secondary | ICD-10-CM | POA: Diagnosis not present

## 2019-01-05 DIAGNOSIS — S299XXA Unspecified injury of thorax, initial encounter: Secondary | ICD-10-CM | POA: Diagnosis present

## 2019-01-05 DIAGNOSIS — Z79899 Other long term (current) drug therapy: Secondary | ICD-10-CM | POA: Insufficient documentation

## 2019-01-05 DIAGNOSIS — Y999 Unspecified external cause status: Secondary | ICD-10-CM | POA: Diagnosis not present

## 2019-01-05 DIAGNOSIS — S29012A Strain of muscle and tendon of back wall of thorax, initial encounter: Secondary | ICD-10-CM | POA: Insufficient documentation

## 2019-01-05 DIAGNOSIS — J45909 Unspecified asthma, uncomplicated: Secondary | ICD-10-CM | POA: Insufficient documentation

## 2019-01-05 DIAGNOSIS — Y939 Activity, unspecified: Secondary | ICD-10-CM | POA: Insufficient documentation

## 2019-01-05 MED ORDER — ACETAMINOPHEN 500 MG PO TABS
1000.0000 mg | ORAL_TABLET | Freq: Once | ORAL | Status: AC
  Administered 2019-01-05: 1000 mg via ORAL
  Filled 2019-01-05: qty 2

## 2019-01-05 NOTE — Discharge Instructions (Addendum)
It was our pleasure to provide your ER care today - we hope that you feel better.  Your xrays look good, no fracture is seen.   Take ibuprofen or acetaminophen as need.   If constipated, take colace (stool softener) and miralax (laxative) as need - these medications are available over the counter.  Follow up with primary care doctor in the next 1-2 weeks - also have blood pressure rechecked then, as it is mildly high today.  Return to ER if worse, new symptoms, new or severe pain, or other concern.

## 2019-01-05 NOTE — ED Notes (Signed)
Dr. Delice Lesch bedside.
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
## 2019-01-05 NOTE — ED Notes (Signed)
An After Visit Summary was printed and given to the patient. Discharge instructions given and no further questions at this time.  
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
## 2019-01-27 ENCOUNTER — Encounter (HOSPITAL_COMMUNITY): Payer: Self-pay | Admitting: Obstetrics and Gynecology

## 2019-01-27 ENCOUNTER — Emergency Department (HOSPITAL_COMMUNITY)
Admission: EM | Admit: 2019-01-27 | Discharge: 2019-01-27 | Disposition: A | Discharge status: Home / Self Care | Payer: Medicaid Other | Attending: Emergency Medicine | Admitting: Emergency Medicine

## 2019-01-27 ENCOUNTER — Other Ambulatory Visit: Payer: Self-pay

## 2019-01-27 DIAGNOSIS — Z79899 Other long term (current) drug therapy: Secondary | ICD-10-CM | POA: Diagnosis not present

## 2019-01-27 DIAGNOSIS — F419 Anxiety disorder, unspecified: Secondary | ICD-10-CM | POA: Diagnosis present

## 2019-01-27 DIAGNOSIS — Z21 Asymptomatic human immunodeficiency virus [HIV] infection status: Secondary | ICD-10-CM | POA: Insufficient documentation

## 2019-01-27 DIAGNOSIS — F17200 Nicotine dependence, unspecified, uncomplicated: Secondary | ICD-10-CM | POA: Diagnosis not present

## 2019-01-27 DIAGNOSIS — I10 Essential (primary) hypertension: Secondary | ICD-10-CM | POA: Insufficient documentation

## 2019-01-27 DIAGNOSIS — R45851 Suicidal ideations: Secondary | ICD-10-CM | POA: Diagnosis not present

## 2019-01-27 DIAGNOSIS — F209 Schizophrenia, unspecified: Secondary | ICD-10-CM | POA: Insufficient documentation

## 2019-01-27 LAB — CBC
HCT: 48.9 % (ref 39.0–52.0)
Hemoglobin: 15.4 g/dL (ref 13.0–17.0)
MCH: 30.4 pg (ref 26.0–34.0)
MCHC: 31.5 g/dL (ref 30.0–36.0)
MCV: 96.4 fL (ref 80.0–100.0)
Platelets: 229 10*3/uL (ref 150–400)
RBC: 5.07 MIL/uL (ref 4.22–5.81)
RDW: 11.9 % (ref 11.5–15.5)
WBC: 6.9 10*3/uL (ref 4.0–10.5)
nRBC: 0 % (ref 0.0–0.2)

## 2019-01-27 LAB — COMPREHENSIVE METABOLIC PANEL
ALT: 31 U/L (ref 0–44)
AST: 26 U/L (ref 15–41)
Albumin: 4.3 g/dL (ref 3.5–5.0)
Alkaline Phosphatase: 86 U/L (ref 38–126)
Anion gap: 10 (ref 5–15)
BUN: 12 mg/dL (ref 6–20)
CO2: 26 mmol/L (ref 22–32)
Calcium: 9.8 mg/dL (ref 8.9–10.3)
Chloride: 107 mmol/L (ref 98–111)
Creatinine, Ser: 1.08 mg/dL (ref 0.61–1.24)
GFR calc Af Amer: >60 mL/min (ref >60)
GFR calc non Af Amer: >60 mL/min (ref >60)
Glucose, Bld: 74 mg/dL (ref 70–99)
Potassium: 3.6 mmol/L (ref 3.5–5.1)
Sodium: 143 mmol/L (ref 135–145)
Total Bilirubin: 1.2 mg/dL (ref 0.3–1.2)
Total Protein: 7.8 g/dL (ref 6.5–8.1)

## 2019-01-27 LAB — SALICYLATE LEVEL: Salicylate Lvl: <7 mg/dL (ref 2.8–30.0)

## 2019-01-27 LAB — ACETAMINOPHEN LEVEL: Acetaminophen (Tylenol), Serum: <10 ug/mL — ABNORMAL LOW (ref 10–30)

## 2019-01-27 LAB — ETHANOL: Alcohol, Ethyl (B): <10 mg/dL (ref <10)

## 2019-01-27 MED ORDER — HALOPERIDOL DECANOATE 100 MG/ML IM SOLN
200.0000 mg | INTRAMUSCULAR | Status: DC
  Administered 2019-01-27: 200 mg via INTRAMUSCULAR
  Filled 2019-01-27: qty 2

## 2019-01-27 NOTE — ED Provider Notes (Signed)
[facility] DEPT Provider Note   CSN: [id number] Arrival date & time: 01/27/19  1505     History   Chief Complaint Chief Complaint  Patient presents with  . Suicidal    HPI [name] [name] is a 43 y.o. male.     HPI   [name] [name] is a 43 y.o. male, with a history of schizophrenia, HTN, asthma, drug abuse, presenting to the ED with suicidal ideations beginning last week.  Patient states he found out last week that he has HIV and this has upset him.   Plan: Patient states, "I have one of my shipments coming in that includes guns.  I was going to take one of those and blow my head off."  Support system: Patient states he has support in his wife.  He has an appointment with [facility] tomorrow, December 8.  He has an appointment with his psychiatrist on December 9.  Previous Attempts:  N/A  Drug/alcohol use: Patient admits to cocaine use, but states his last use was last week.  Denies routine alcohol use.  Medication compliance: Intermittent compliance with his medications.  States he does not think he has had his Haldol injection this month or last month, but he is unsure of the dose.  Medication changes: None, per patient.  Physical complaints: None.        Past Medical History:  Diagnosis Date  . Aggression   . Alcohol abuse   . Asthma   . Homeless   . Hypertension   . Schizophrenia [facility])     Patient Active Problem List   Diagnosis Date Noted  . Cocaine abuse ([place]) 10/04/2015  . Suicidal ideation 09/21/2015  . Cannabis use disorder, severe, dependence ([place]) 06/18/2015  . Behavior disturbance   . Schizoaffective disorder, bipolar type ([place]) 04/28/2015  . Adult antisocial behavior 04/04/2015  . Polysubstance abuse ([place]) 04/04/2015  . Homicidal ideation 10/16/2014  . Obstructive sleep apnea 10/16/2014    Past Surgical History:  Procedure Laterality Date  . KNEE SURGERY    . MOUTH SURGERY          Home Medications    Prior to Admission medications   Medication Sig Start Date End Date Taking? Authorizing Provider  amLODipine (NORVASC) 2.5 MG tablet Take 2.5 mg by mouth daily.   Yes [provider]  chlorproMAZINE (THORAZINE) 100 MG tablet Take 1 tablet (100 mg total) by mouth every morning AND 3 tablets (300 mg total) every evening. Patient taking differently: Take 2 tablet (200 mg) at bedtime 05/19/17  Yes Francine Graven, DO  haloperidol (HALDOL) 10 MG tablet Take 20 mg by mouth at bedtime.   Yes [provider]  haloperidol (HALDOL) 5 MG tablet Take 5 mg by mouth every 6 (six) hours as needed for agitation.  11/12/18  Yes [provider]  haloperidol decanoate (HALDOL DECANOATE) 100 MG/ML injection Inject 200 mg into the muscle every 28 (twenty-eight) days.    Yes [provider]  lithium 300 MG tablet Take 1 tablet (300 mg total) by mouth 3 (three) times daily. Patient taking differently: Take 900 mg by mouth at bedtime.  05/19/17  Yes Francine Graven, DO  benztropine (COGENTIN) 1 MG tablet Take 1 tablet (1 mg total) by mouth 2 (two) times daily. Patient not taking: Reported on 01/27/2019 05/19/17   Francine Graven, DO  ibuprofen (ADVIL,MOTRIN) 800 MG tablet Take 1 tablet (800 mg total) by mouth every 8 (eight) hours as needed. Patient  not taking: Reported on 01/27/2019 03/31/18   Tilden Fossa, MD  oxcarbazepine (TRILEPTAL) 600 MG tablet Take 1 tablet (600 mg total) by mouth 2 (two) times daily. Patient not taking: Reported on 01/27/2019 05/19/17   Samuel Jester, DO    Family History No family history on file.  Social History Social History   Tobacco Use  . Smoking status: Current Every Day Smoker  . Smokeless tobacco: Never Used  Substance Use Topics  . Alcohol use: Yes  . Drug use: Yes    Types: Cocaine, Marijuana, Hydrocodone, MDMA (Ecstacy)    Comment: former     Allergies   Prunus persica, Pseudoephedrine hcl, Sudafed  [pseudoephedrine], and Food   Review of Systems Review of Systems  Respiratory: Negative for cough and shortness of breath.   Cardiovascular: Negative for chest pain.  Gastrointestinal: Negative for abdominal pain, diarrhea, nausea and vomiting.  Genitourinary: Negative for discharge, dysuria, frequency, hematuria, scrotal swelling and testicular pain.  Neurological: Negative for dizziness, weakness and numbness.  Psychiatric/Behavioral: Positive for suicidal ideas.  All other systems reviewed and are negative.    Physical Exam Updated Vital Signs BP 135/85 (BP Location: Left Arm)   Pulse 97   Temp 98.5 F (36.9 C) (Oral)   Resp 16   SpO2 99%   Physical Exam Vitals signs and nursing note reviewed.  Constitutional:      General: He is not in acute distress.    Appearance: He is well-developed. He is not diaphoretic.  HENT:     Head: Normocephalic and atraumatic.     Mouth/Throat:     Mouth: Mucous membranes are moist.     Pharynx: Oropharynx is clear.  Eyes:     Conjunctiva/sclera: Conjunctivae normal.  Neck:     Musculoskeletal: Neck supple.  Cardiovascular:     Rate and Rhythm: Normal rate and regular rhythm.     Pulses: Normal pulses.  Pulmonary:     Effort: Pulmonary effort is normal. No respiratory distress.  Abdominal:     Tenderness: There is no guarding.  Musculoskeletal:     Right lower leg: No edema.     Left lower leg: No edema.  Lymphadenopathy:     Cervical: No cervical adenopathy.  Skin:    General: Skin is warm and dry.  Neurological:     Mental Status: He is alert and oriented to person, place, and time.  Psychiatric:        Mood and Affect: Mood and affect normal.        Speech: Speech normal.        Behavior: Behavior normal.     Comments: Calm, attentive, makes good eye contact.      ED Treatments / Results  Labs (all labs ordered are listed, but only abnormal results are displayed) Labs Reviewed  ACETAMINOPHEN LEVEL - Abnormal;  Notable for the following components:      Result Value   Acetaminophen (Tylenol), Serum <10 (*)    All other components within normal limits  COMPREHENSIVE METABOLIC PANEL  ETHANOL  SALICYLATE LEVEL  CBC  RAPID URINE DRUG SCREEN, HOSP PERFORMED    EKG None  Radiology No results found.  Procedures Procedures (including critical care time)  Medications Ordered in ED Medications  haloperidol decanoate (HALDOL DECANOATE) 100 MG/ML injection 200 mg (has no administration in time range)     Initial Impression / Assessment and Plan / ED Course  I have reviewed the triage vital signs and the nursing notes.  Pertinent  labs & imaging results that were available during my care of the patient were reviewed by me and considered in my medical decision making (see chart for details).  Clinical Course as of Jan 27 2104  Mon Jan 27, 2019  1624 [facility] dosage. Sat on hold for approximately 30 minutes without reaching a live person. I put in a request with the pharmacy to have them verify patient's medications.   [SJ]  2038 Pharmacy tech just now notified me that they verified patient's Haldol injection dosing.  I put an order in for this medication.  However, patient is now stating he would like to leave.   [SJ]    Clinical Course User Index [SJ] ,  C, PA-C       Patient presented initially complaining of suicidal ideations.  He presented as calm and his demeanor and logical in his thought processes.  He received recent upsetting news regarding his health, but has a solid plan for follow-up. During his ED course, patient mentioned to me multiple times that he was feeling much better.  He did not think he needed to speak with the TTS counselor, but I encouraged the patient to at least talk with them and he agreed to wait.  He was initially panicking regarding his diagnosis of HIV, but he states the reassurance  from myself and other staff members here in the ED have made him realize that he just needs to take things step-by-step.  He is no longer having suicidal ideations and has a more hopeful outlook.  He has plans to keep his appointments this week. He does not want to stay for TTS counselor to speak with him.  He contracts for safety.  He has close follow-up already scheduled.   Findings and plan of care discussed with Virgina NorfolkAdam Curatolo, DO.   Final Clinical Impressions(s) / ED Diagnoses   Final diagnoses:  Anxiousness    ED Discharge Orders    None       Concepcion Living,  C, PA-C 01/27/19 2112    Virgina NorfolkCuratolo, Adam, DO 01/27/19 2214
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
## 2019-01-27 NOTE — ED Notes (Signed)
Patient requesting to speak to provider.  Shawn, PA made aware

## 2019-01-27 NOTE — ED Notes (Signed)
Pt A&O x 4, resting at present, 1-1 sitter at bedside.  Pt remains SI.  Denies HI or AVH.  Monitoring for safety.  Pending TTS assessment.

## 2019-01-27 NOTE — Discharge Instructions (Signed)
Please be sure to keep your appointment with your primary care provider tomorrow. Return to the emergency department for suicidal thoughts or feelings.

## 2019-01-30 ENCOUNTER — Other Ambulatory Visit: Payer: Self-pay

## 2019-01-30 ENCOUNTER — Other Ambulatory Visit: Payer: Self-pay | Admitting: *Deleted

## 2019-01-30 ENCOUNTER — Other Ambulatory Visit: Payer: Medicaid Other

## 2019-01-30 ENCOUNTER — Other Ambulatory Visit (HOSPITAL_COMMUNITY)
Admission: RE | Admit: 2019-01-30 | Discharge: 2019-01-30 | Disposition: A | Discharge status: Home / Self Care | Payer: Medicaid Other | Source: Ambulatory Visit | Attending: Infectious Diseases | Admitting: Infectious Diseases

## 2019-01-30 ENCOUNTER — Ambulatory Visit: Payer: Medicaid Other

## 2019-01-30 DIAGNOSIS — Z113 Encounter for screening for infections with a predominantly sexual mode of transmission: Secondary | ICD-10-CM

## 2019-01-30 DIAGNOSIS — B20 Human immunodeficiency virus [HIV] disease: Secondary | ICD-10-CM

## 2019-01-31 LAB — URINALYSIS
Bilirubin Urine: NEGATIVE
Glucose, UA: NEGATIVE
Hgb urine dipstick: NEGATIVE
Ketones, ur: NEGATIVE
Nitrite: NEGATIVE
Protein, ur: NEGATIVE
Specific Gravity, Urine: 1.011 (ref 1.001–1.03)
pH: 7 (ref 5.0–8.0)

## 2019-01-31 LAB — URINE CYTOLOGY ANCILLARY ONLY
Chlamydia: NEGATIVE
Comment: NEGATIVE
Comment: NORMAL
Neisseria Gonorrhea: POSITIVE — AB

## 2019-01-31 LAB — T-HELPER CELL (CD4) - (RCID CLINIC ONLY)
CD4 % Helper T Cell: 29 % — ABNORMAL LOW (ref 33–65)
CD4 T Cell Abs: 261 /uL — ABNORMAL LOW (ref 400–1790)

## 2019-02-03 ENCOUNTER — Telehealth: Payer: Self-pay | Admitting: *Deleted

## 2019-02-03 NOTE — Telephone Encounter (Signed)
Patient returned call. Confirmed identity with two identifiers. Patient made aware of results and treatment plan.  Patient verified he only has allergy to peaches and sudafed and advised no sex for 10 days after treatment. Patient verbalized understanding.  Eugenia Mcalpine

## 2019-02-03 NOTE — Telephone Encounter (Signed)
Per [name] [name] called the patient to try and get him in for treatment but he did not answer and no voicemail set up.

## 2019-02-03 NOTE — Telephone Encounter (Signed)
-----   Message from Tuleta Callas, NP sent at 01/31/2019  6:51 PM EST ----- Patient with gonorrhea on urine swab - he will need cefrriaxone 250 mg IM x 1 and azithromycin 1g po x 1. Please verify allergy information prior to administration and advise no sex for 10 days. Thank you.

## 2019-02-04 ENCOUNTER — Ambulatory Visit (INDEPENDENT_AMBULATORY_CARE_PROVIDER_SITE_OTHER): Payer: Medicaid Other

## 2019-02-04 ENCOUNTER — Other Ambulatory Visit: Payer: Self-pay

## 2019-02-04 DIAGNOSIS — A549 Gonococcal infection, unspecified: Secondary | ICD-10-CM | POA: Diagnosis present

## 2019-02-04 MED ORDER — AZITHROMYCIN 250 MG PO TABS
1200.0000 mg | ORAL_TABLET | Freq: Once | ORAL | Status: AC
  Administered 2019-02-04: 1250 mg via ORAL

## 2019-02-04 MED ORDER — AZITHROMYCIN 250 MG PO TABS: 250.0000 mg | ORAL_TABLET | Freq: Once | ORAL | Status: DC

## 2019-02-04 MED ORDER — CEFTRIAXONE SODIUM 250 MG IJ SOLR
250.0000 mg | Freq: Once | INTRAMUSCULAR | Status: AC
  Administered 2019-02-04: 250 mg via INTRAMUSCULAR

## 2019-02-04 NOTE — Progress Notes (Signed)
Patient was here today for rocephin and azithromycin treatment for positive gonorrhea.  He did not seem to understand the reason for his visit so I took  time to explain in simple terms. Patient was very nice but anxious with his thought process and speech.  He repeated each line I spoke to him. He became hyper focused on the subject of sex.   I invited a chaperone in the room for his injection and afterwards he was offered condoms and he responded he would take the condoms "but wanted to use one on you", referencing my male chaperone.  I quickly told the patient he needed to apologize for his statement and informed him , he can not speak to any staff members this way, it is highly inappropriate and could cause immediate discharge from the practice. He apologized and said he understood he should not have said this.     I have concerns for staffing related to  future visits. I do not feel he should be alone with any of our male employees until he has proven to be safe.   I will add this information in the speciality comment section for future warning.    Laverle Patter, RN

## 2019-02-14 LAB — HEPATITIS C ANTIBODY
Hepatitis C Ab: NONREACTIVE
SIGNAL TO CUT-OFF: 0.03 (ref <1.00)

## 2019-02-14 LAB — CBC WITH DIFFERENTIAL/PLATELET
Absolute Monocytes: 506 cells/uL (ref 200–950)
Basophils Absolute: 22 cells/uL (ref 0–200)
Basophils Relative: 0.4 %
Eosinophils Absolute: 94 cells/uL (ref 15–500)
Eosinophils Relative: 1.7 %
HCT: 43.1 % (ref 38.5–50.0)
Hemoglobin: 14.4 g/dL (ref 13.2–17.1)
Lymphs Abs: 1018 cells/uL (ref 850–3900)
MCH: 30.8 pg (ref 27.0–33.0)
MCHC: 33.4 g/dL (ref 32.0–36.0)
MCV: 92.1 fL (ref 80.0–100.0)
MPV: 10.8 fL (ref 7.5–12.5)
Monocytes Relative: 9.2 %
Neutro Abs: 3861 cells/uL (ref 1500–7800)
Neutrophils Relative %: 70.2 %
Platelets: 216 10*3/uL (ref 140–400)
RBC: 4.68 10*6/uL (ref 4.20–5.80)
RDW: 11.7 % (ref 11.0–15.0)
Total Lymphocyte: 18.5 %
WBC: 5.5 10*3/uL (ref 3.8–10.8)

## 2019-02-14 LAB — QUANTIFERON-TB GOLD PLUS
Mitogen-NIL: >10 IU/mL
NIL: 0.04 IU/mL
QuantiFERON-TB Gold Plus: NEGATIVE
TB1-NIL: 0.01 IU/mL
TB2-NIL: 0.02 IU/mL

## 2019-02-14 LAB — RPR: RPR Ser Ql: NONREACTIVE

## 2019-02-14 LAB — COMPLETE METABOLIC PANEL WITH GFR
AG Ratio: 1.5 (calc) (ref 1.0–2.5)
ALT: 22 U/L (ref 9–46)
AST: 19 U/L (ref 10–40)
Albumin: 4 g/dL (ref 3.6–5.1)
Alkaline phosphatase (APISO): 92 U/L (ref 36–130)
BUN: 10 mg/dL (ref 7–25)
CO2: 27 mmol/L (ref 20–32)
Calcium: 10 mg/dL (ref 8.6–10.3)
Chloride: 108 mmol/L (ref 98–110)
Creat: 1.14 mg/dL (ref 0.60–1.35)
GFR, Est African American: 91 mL/min/{1.73_m2} (ref >=60)
GFR, Est Non African American: 79 mL/min/{1.73_m2} (ref >=60)
Globulin: 2.6 g/dL (calc) (ref 1.9–3.7)
Glucose, Bld: 106 mg/dL — ABNORMAL HIGH (ref 65–99)
Potassium: 4.5 mmol/L (ref 3.5–5.3)
Sodium: 143 mmol/L (ref 135–146)
Total Bilirubin: 0.5 mg/dL (ref 0.2–1.2)
Total Protein: 6.6 g/dL (ref 6.1–8.1)

## 2019-02-14 LAB — LIPID PANEL
Cholesterol: 143 mg/dL (ref <200)
HDL: 34 mg/dL — ABNORMAL LOW (ref >=40)
LDL Cholesterol (Calc): 90 mg/dL (calc)
Non-HDL Cholesterol (Calc): 109 mg/dL (calc) (ref <130)
Total CHOL/HDL Ratio: 4.2 (calc) (ref <5.0)
Triglycerides: 91 mg/dL (ref <150)

## 2019-02-14 LAB — HEPATITIS B CORE ANTIBODY, TOTAL: Hep B Core Total Ab: NONREACTIVE

## 2019-02-14 LAB — HIV-1/2 AB - DIFFERENTIATION
HIV-1 antibody: POSITIVE — AB
HIV-2 Ab: NEGATIVE

## 2019-02-14 LAB — HEPATITIS B SURFACE ANTIBODY,QUALITATIVE: Hep B S Ab: REACTIVE — AB

## 2019-02-14 LAB — HIV-1 GENOTYPE: HIV-1 Genotype: DETECTED — AB

## 2019-02-14 LAB — HEPATITIS A ANTIBODY, TOTAL: Hepatitis A AB,Total: NONREACTIVE

## 2019-02-14 LAB — HLA B*5701: HLA-B*5701 w/rflx HLA-B High: NEGATIVE

## 2019-02-14 LAB — HIV ANTIBODY (ROUTINE TESTING W REFLEX): HIV 1&2 Ab, 4th Generation: REACTIVE — AB

## 2019-02-14 LAB — HIV-1 RNA ULTRAQUANT REFLEX TO GENTYP+
HIV 1 RNA Quant: 57400 copies/mL — ABNORMAL HIGH
HIV-1 RNA Quant, Log: 4.76 Log copies/mL — ABNORMAL HIGH

## 2019-02-14 LAB — HEPATITIS B SURFACE ANTIGEN: Hepatitis B Surface Ag: NONREACTIVE

## 2019-03-04 ENCOUNTER — Ambulatory Visit: Payer: Medicaid Other | Admitting: Pharmacist

## 2019-03-04 ENCOUNTER — Encounter: Payer: Medicaid Other | Admitting: Infectious Diseases
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
## 2019-03-10 ENCOUNTER — Ambulatory Visit: Payer: Medicaid Other | Admitting: Infectious Diseases

## 2019-03-10 ENCOUNTER — Ambulatory Visit: Payer: Medicaid Other | Admitting: Pharmacist

## 2019-03-11 ENCOUNTER — Ambulatory Visit (INDEPENDENT_AMBULATORY_CARE_PROVIDER_SITE_OTHER): Payer: Medicaid Other | Admitting: Pharmacist

## 2019-03-11 ENCOUNTER — Other Ambulatory Visit: Payer: Self-pay

## 2019-03-11 ENCOUNTER — Telehealth: Payer: Self-pay | Admitting: *Deleted

## 2019-03-11 ENCOUNTER — Encounter: Payer: Self-pay | Admitting: Infectious Disease

## 2019-03-11 ENCOUNTER — Ambulatory Visit (INDEPENDENT_AMBULATORY_CARE_PROVIDER_SITE_OTHER): Payer: Medicaid Other | Admitting: Infectious Disease

## 2019-03-11 VITALS — BP 144/89 | HR 96 | Temp 97.6°F | Ht 70.0 in | Wt 204.0 lb

## 2019-03-11 DIAGNOSIS — B20 Human immunodeficiency virus [HIV] disease: Secondary | ICD-10-CM | POA: Diagnosis not present

## 2019-03-11 DIAGNOSIS — Z72811 Adult antisocial behavior: Secondary | ICD-10-CM | POA: Diagnosis not present

## 2019-03-11 DIAGNOSIS — F1910 Other psychoactive substance abuse, uncomplicated: Secondary | ICD-10-CM

## 2019-03-11 DIAGNOSIS — F250 Schizoaffective disorder, bipolar type: Secondary | ICD-10-CM

## 2019-03-11 DIAGNOSIS — F1410 Cocaine abuse, uncomplicated: Secondary | ICD-10-CM | POA: Diagnosis not present

## 2019-03-11 DIAGNOSIS — F1220 Cannabis dependence, uncomplicated: Secondary | ICD-10-CM

## 2019-03-11 HISTORY — DX: Human immunodeficiency virus (HIV) disease: B20

## 2019-03-11 MED ORDER — BIKTARVY 50-200-25 MG PO TABS: 1.0000 | ORAL_TABLET | Freq: Every day | ORAL | 11 refills | Status: DC

## 2019-03-11 MED ORDER — LEVETIRACETAM 500 MG PO TABS: 500.0000 mg | ORAL_TABLET | Freq: Two times a day (BID) | ORAL | 11 refills | Status: DC

## 2019-03-11 NOTE — Progress Notes (Signed)
Subjective:   Chief complaint here to establish care for HIV disease   Patient ID: [name] [name], male    DOB: Apr 02, 1975, 44 y.o.   MRN: [id number]  HPI  44 year old African-American man with recently diagnosed HIV disease who has a myriad of psychiatric problems including schizophrenia, antisocial behavior problems with cocaine abuse and hypersexuality who was recently diagnosed with HIV disease.  His viral load was in the 50,000 range with a wild-type virus.  He is hepatitis B surface antigen negative HLA B5 701 -.  He is on Trileptal which is a major problem for most of the antiretrovirals that we would want to prescribe him.  Therefore we will change him off of that to [place] and place him on Biktarvy.  He is anxious to get to undetectable as that is the best replacement have for what he wants which to be cured of HIV.    Past Medical History:  Diagnosis Date  . Aggression   . Alcohol abuse   . Asthma   . HIV disease ([place]) 03/11/2019  . Homeless   . Hypertension   . Schizophrenia [facility])     Past Surgical History:  Procedure Laterality Date  . KNEE SURGERY    . MOUTH SURGERY      No family history on file.    Social History   Socioeconomic History  . Marital status: Married    Spouse name: Not on file  . Number of children: Not on file  . Years of education: Not on file  . Highest education level: Not on file  Occupational History  . Not on file  Tobacco Use  . Smoking status: Current Every Day Smoker  . Smokeless tobacco: Never Used  Substance and Sexual Activity  . Alcohol use: Yes  . Drug use: Yes    Types: Cocaine, Marijuana, Hydrocodone, MDMA (Ecstacy)    Comment: former  . Sexual activity: Yes    Comment: Patient reports he "has fucked half the bitches in [place]"  Other Topics Concern  . Not on file  Social History Narrative  . Not on file   Social Determinants of Health   Financial Resource Strain:   . Difficulty of Paying Living  Expenses: Not on file  Food Insecurity:   . Worried About Charity fundraiser in the Last Year: Not on file  . Ran Out of Food in the Last Year: Not on file  Transportation Needs:   . Lack of Transportation (Medical): Not on file  . Lack of Transportation (Non-Medical): Not on file  Physical Activity:   . Days of Exercise per Week: Not on file  . Minutes of Exercise per Session: Not on file  Stress:   . Feeling of Stress : Not on file  Social Connections:   . Frequency of Communication with Friends and Family: Not on file  . Frequency of Social Gatherings with Friends and Family: Not on file  . Attends Religious Services: Not on file  . Active Member of Clubs or Organizations: Not on file  . Attends Archivist Meetings: Not on file  . Marital Status: Not on file    Allergies  Allergen Reactions  . Prunus Persica Swelling  . Pseudoephedrine Hcl Swelling  . Sudafed [Pseudoephedrine] Swelling and Rash  . Food Swelling and Other (See Comments)    Pt states that he is allergic to peaches.     Current Outpatient Medications:  .  amLODipine (NORVASC) 2.5 MG tablet, Take 2.5  mg by mouth daily., Disp: , Rfl:  .  benztropine (COGENTIN) 1 MG tablet, Take 1 tablet (1 mg total) by mouth 2 (two) times daily., Disp: 20 tablet, Rfl: 0 .  chlorproMAZINE (THORAZINE) 100 MG tablet, Take 1 tablet (100 mg total) by mouth every morning AND 3 tablets (300 mg total) every evening. (Patient taking differently: Take 2 tablet (200 mg) at bedtime), Disp: 38 tablet, Rfl: 0 .  haloperidol (HALDOL) 10 MG tablet, Take 20 mg by mouth at bedtime., Disp: , Rfl:  .  haloperidol (HALDOL) 5 MG tablet, Take 5 mg by mouth every 6 (six) hours as needed for agitation. , Disp: , Rfl:  .  haloperidol decanoate (HALDOL DECANOATE) 100 MG/ML injection, Inject 200 mg into the muscle every 28 (twenty-eight) days. , Disp: , Rfl:  .  ibuprofen (ADVIL,MOTRIN) 800 MG tablet, Take 1 tablet (800 mg total) by mouth every 8  (eight) hours as needed., Disp: 15 tablet, Rfl: 0 .  lithium 300 MG tablet, Take 1 tablet (300 mg total) by mouth 3 (three) times daily. (Patient taking differently: Take 900 mg by mouth at bedtime. ), Disp: 30 tablet, Rfl: 0 .  bictegravir-emtricitabine-tenofovir AF (BIKTARVY) 50-200-25 MG TABS tablet, Take 1 tablet by mouth daily., Disp: 30 tablet, Rfl: 11 .  levETIRAcetam (KEPPRA) 500 MG tablet, Take 1 tablet (500 mg total) by mouth 2 (two) times daily., Disp: 60 tablet, Rfl: 11  Current Facility-Administered Medications:  .  azithromycin (ZITHROMAX) tablet 250 mg, 250 mg, Oral, Once, Dixon, Melton Krebs, NP   Review of Systems  Unable to perform ROS: Psychiatric disorder       Objective:   Physical Exam Constitutional:      General: He is not in acute distress.    Appearance: Normal appearance. He is well-developed. He is not ill-appearing or diaphoretic.  HENT:     Head: Normocephalic and atraumatic.     Right Ear: Hearing and external ear normal.     Left Ear: Hearing and external ear normal.     Nose: No nasal deformity or rhinorrhea.  Eyes:     General: No scleral icterus.    Conjunctiva/sclera: Conjunctivae normal.     Right eye: Right conjunctiva is not injected.     Left eye: Left conjunctiva is not injected.     Pupils: Pupils are equal, round, and reactive to light.  Neck:     Vascular: No JVD.  Cardiovascular:     Rate and Rhythm: Normal rate and regular rhythm.     Heart sounds: S1 normal and S2 normal.  Pulmonary:     Effort: Pulmonary effort is normal. No respiratory distress.  Abdominal:     Palpations: Abdomen is soft.     Tenderness: There is no abdominal tenderness.  Musculoskeletal:        General: Normal range of motion.     Right shoulder: Normal.     Left shoulder: Normal.     Cervical back: Normal range of motion and neck supple.     Right hip: Normal.     Left hip: Normal.     Right knee: Normal.     Left knee: Normal.  Lymphadenopathy:      Head:     Right side of head: No submandibular, preauricular or posterior auricular adenopathy.     Left side of head: No submandibular, preauricular or posterior auricular adenopathy.     Cervical: No cervical adenopathy.     Right cervical: No superficial or  deep cervical adenopathy.    Left cervical: No superficial or deep cervical adenopathy.  Skin:    General: Skin is warm and dry.     Coloration: Skin is not pale.     Findings: No abrasion, bruising, ecchymosis, erythema, lesion or rash.     Nails: There is no clubbing.  Neurological:     Mental Status: He is alert and oriented to person, place, and time.     Sensory: No sensory deficit.     Coordination: Coordination normal.     Gait: Gait normal.  Psychiatric:        Attention and Perception: He is attentive.        Mood and Affect: Mood is anxious. Affect is labile.        Speech: Speech normal.        Behavior: Behavior is hyperactive. Behavior is cooperative.        Thought Content: Thought content is delusional.        Cognition and Memory: Cognition is impaired.        Judgment: Judgment is impulsive and inappropriate.           Assessment & Plan:   HIV disease we will start him on Biktarvy and have him come back and see Dorothea Ogle in roughly a week and then have labs done in 1 month and see me in 6 weeks time.  He refused all vaccinations despite my counseling.  Schizophrenia, question bipolar disorder: He has been on Trileptal but not clear whether this is for his psychiatric problems or for seizures but we are going to switch him off of Trileptal to Keppra 500 mg twice daily due to the fact that the Trileptal has significant interactions with Biktarvy and most of the other antiretroviral regimens that we would come up with to treat him.  Seizure disorder: As mentioned switching out Trileptal for Biktarvy  Substance abuse: Large he has problems with cocaine which apparently causes him to have problems with  hypersexuality.  Antisocial personality: I am concerned about his overall labile psychiatric problems which been documented in the chart hopefully he can not behave inappropriately in the clinic as he has done at times over the hospital and can stay on task.  He seems eager to start his antiretroviral to get his HIV under control and perhaps this will play a good role in his overall mental health.  We spent greater than 45 minutes with the patient including greater than 50% of time in face to face counsel of the patient the nature of HIV infection regarding his other comorbid conditions and how we would need for medications with his and in coordination of his care

## 2019-03-11 NOTE — Progress Notes (Signed)
HPI: [name] [name] is a 44 y.o. male who presents to the [facility] clinic today to initiate care for his newly diagnosed HIV infection.  Patient Active Problem List   Diagnosis Date Noted  . HIV disease (HCC) 03/11/2019  . Cocaine abuse (HCC) 10/04/2015  . Suicidal ideation 09/21/2015  . Cannabis use disorder, severe, dependence (HCC) 06/18/2015  . Behavior disturbance   . Schizoaffective disorder, bipolar type (HCC) 04/28/2015  . Adult antisocial behavior 04/04/2015  . Polysubstance abuse (HCC) 04/04/2015  . Homicidal ideation 10/16/2014  . Obstructive sleep apnea 10/16/2014    Patient's Medications  New Prescriptions   No medications on file  Previous Medications   AMLODIPINE (NORVASC) 2.5 MG TABLET    Take 2.5 mg by mouth daily.   BENZTROPINE (COGENTIN) 1 MG TABLET    Take 1 tablet (1 mg total) by mouth 2 (two) times daily.   BICTEGRAVIR-EMTRICITABINE-TENOFOVIR AF (BIKTARVY) 50-200-25 MG TABS TABLET    Take 1 tablet by mouth daily.   CHLORPROMAZINE (THORAZINE) 100 MG TABLET    Take 1 tablet (100 mg total) by mouth every morning AND 3 tablets (300 mg total) every evening.   HALOPERIDOL (HALDOL) 10 MG TABLET    Take 20 mg by mouth at bedtime.   HALOPERIDOL (HALDOL) 5 MG TABLET    Take 5 mg by mouth every 6 (six) hours as needed for agitation.    HALOPERIDOL DECANOATE (HALDOL DECANOATE) 100 MG/ML INJECTION    Inject 200 mg into the muscle every 28 (twenty-eight) days.    IBUPROFEN (ADVIL,MOTRIN) 800 MG TABLET    Take 1 tablet (800 mg total) by mouth every 8 (eight) hours as needed.   LEVETIRACETAM (KEPPRA) 500 MG TABLET    Take 1 tablet (500 mg total) by mouth 2 (two) times daily.   LITHIUM 300 MG TABLET    Take 1 tablet (300 mg total) by mouth 3 (three) times daily.  Modified Medications   No medications on file  Discontinued Medications   No medications on file    Allergies: Allergies  Allergen Reactions  . Prunus Persica Swelling  . Pseudoephedrine Hcl Swelling  .  Sudafed [Pseudoephedrine] Swelling and Rash  . Food Swelling and Other (See Comments)    Pt states that he is allergic to peaches.    Past Medical History: Past Medical History:  Diagnosis Date  . Aggression   . Alcohol abuse   . Asthma   . HIV disease (HCC) 03/11/2019  . Homeless   . Hypertension   . Schizophrenia [facility])     Social History: Social History   Socioeconomic History  . Marital status: Married    Spouse name: Not on file  . Number of children: Not on file  . Years of education: Not on file  . Highest education level: Not on file  Occupational History  . Not on file  Tobacco Use  . Smoking status: Current Every Day Smoker  . Smokeless tobacco: Never Used  Substance and Sexual Activity  . Alcohol use: Yes  . Drug use: Yes    Types: Cocaine, Marijuana, Hydrocodone, MDMA (Ecstacy)    Comment: former  . Sexual activity: Yes    Comment: Patient reports he "has fucked half the bitches in [place]"  Other Topics Concern  . Not on file  Social History Narrative  . Not on file   Social Determinants of Health   Financial Resource Strain:   . Difficulty of Paying Living Expenses: Not on file  Food  Insecurity:   . Worried About Programme researcher, broadcasting/film/video in the Last Year: Not on file  . Ran Out of Food in the Last Year: Not on file  Transportation Needs:   . Lack of Transportation (Medical): Not on file  . Lack of Transportation (Non-Medical): Not on file  Physical Activity:   . Days of Exercise per Week: Not on file  . Minutes of Exercise per Session: Not on file  Stress:   . Feeling of Stress : Not on file  Social Connections:   . Frequency of Communication with Friends and Family: Not on file  . Frequency of Social Gatherings with Friends and Family: Not on file  . Attends Religious Services: Not on file  . Active Member of Clubs or Organizations: Not on file  . Attends Banker Meetings: Not on file  . Marital Status: Not on file     Labs: Lab Results  Component Value Date   HIV1RNAQUANT 57,400 (H) 01/30/2019   CD4TABS 261 (L) 01/30/2019    RPR and STI Lab Results  Component Value Date   LABRPR NON-REACTIVE 01/30/2019   LABRPR NON REACTIVE 03/14/2009    STI Results GC CT  01/30/2019 Positive(A) Negative  12/25/2007 - NEGATIVERTFDELIM(NOTE)  Testing performed using the BD Probetec ET Chlamydia trachomatis and Neisseria gonorrhea amplified DNA assay.    Hepatitis B Lab Results  Component Value Date   HEPBSAB REACTIVE (A) 01/30/2019   HEPBSAG NON-REACTIVE 01/30/2019   HEPBCAB NON-REACTIVE 01/30/2019   Hepatitis C Lab Results  Component Value Date   HEPCAB NON-REACTIVE 01/30/2019   Hepatitis A Lab Results  Component Value Date   HAV NON-REACTIVE 01/30/2019   Lipids: Lab Results  Component Value Date   CHOL 143 01/30/2019   TRIG 91 01/30/2019   HDL 34 (L) 01/30/2019   CHOLHDL 4.2 01/30/2019   VLDL 37 03/14/2009   LDLCALC 90 01/30/2019    Current HIV Regimen: Treatment naive  Assessment: Mr. Diloreto is here today to initiate care for newly diagnosed HIV infection.  Patient is treatment naive with an initial HIV viral load of 57,400 and a CD4 count of 261.  No resistance mutations found on initial genotype. Will start patient on Biktarvy.  Patient is currently taking Trileptal, lithium, and Haldol for seizures and his bipolar disorder. Trileptal has many drug interactions with HIV medication options and we began discussing other antiepileptic options with him such as Keppra. Mr. Caperton stated his Trileptal was just for sleep and he could easily stop taking it but he then endorsed suffering from seizures in the past. We encouraged Mr. Basher to continue taking his medications to prevent further seizures and patient became doubtful and called his mother. After speaking with patient's mother regarding the drug interactions she was agreeable to switching patient to Keppra and starting  Biktarvy.   Counseled patient on side effects of Biktarvy including "start up syndrome" of nausea/dizziness/fatigue/diarrhea as well as general nausea and fatigue when starting therapy. Mr. Wolman stated he takes iron but it may be vitamin d or fish oil. Counseled patient to separate his supplements from [place] by at least four hours. Mr. Morgenstern also stated he only takes his medications at night. Because Keppra is twice daily, recommended patient take Biktarvy and his first dose of Keppra in the morning and then take his vitamins, second dose of Keppra, and other medications in the evening. We gave Mr. Pullman a pill box to aid in this and patient seemed agreeable. Emphasized the  importance of adherence to [facility] in order to become undetectable and maintain that status. Will follow-up with Mr. Sauser in a week to ensure he is tolerating his new medications and able to take them appropriately.   Plan: - Stop Trileptal and start Keppra 500 mg twice daily - Start taking Biktarvy  - Follow up with Cassie on 1/27 at 1:45 pm  Ladoris Gene, PharmD [facility] 03/11/2019, 9:51 AM

## 2019-03-11 NOTE — Progress Notes (Signed)
HPI: [name] [name] is a 44 y.o. male who presents to the [facility] clinic today to initiate HIV care.  Patient Active Problem List   Diagnosis Date Noted  . HIV disease (HCC) 03/11/2019  . Cocaine abuse (HCC) 10/04/2015  . Suicidal ideation 09/21/2015  . Cannabis use disorder, severe, dependence (HCC) 06/18/2015  . Behavior disturbance   . Schizoaffective disorder, bipolar type (HCC) 04/28/2015  . Adult antisocial behavior 04/04/2015  . Polysubstance abuse (HCC) 04/04/2015  . Homicidal ideation 10/16/2014  . Obstructive sleep apnea 10/16/2014    Patient's Medications  New Prescriptions   BICTEGRAVIR-EMTRICITABINE-TENOFOVIR AF (BIKTARVY) 50-200-25 MG TABS TABLET    Take 1 tablet by mouth daily.   LEVETIRACETAM (KEPPRA) 500 MG TABLET    Take 1 tablet (500 mg total) by mouth 2 (two) times daily.  Previous Medications   AMLODIPINE (NORVASC) 2.5 MG TABLET    Take 2.5 mg by mouth daily.   BENZTROPINE (COGENTIN) 1 MG TABLET    Take 1 tablet (1 mg total) by mouth 2 (two) times daily.   CHLORPROMAZINE (THORAZINE) 100 MG TABLET    Take 1 tablet (100 mg total) by mouth every morning AND 3 tablets (300 mg total) every evening.   HALOPERIDOL (HALDOL) 10 MG TABLET    Take 20 mg by mouth at bedtime.   HALOPERIDOL (HALDOL) 5 MG TABLET    Take 5 mg by mouth every 6 (six) hours as needed for agitation.    HALOPERIDOL DECANOATE (HALDOL DECANOATE) 100 MG/ML INJECTION    Inject 200 mg into the muscle every 28 (twenty-eight) days.    IBUPROFEN (ADVIL,MOTRIN) 800 MG TABLET    Take 1 tablet (800 mg total) by mouth every 8 (eight) hours as needed.   LITHIUM 300 MG TABLET    Take 1 tablet (300 mg total) by mouth 3 (three) times daily.  Modified Medications   No medications on file  Discontinued Medications   OXCARBAZEPINE (TRILEPTAL) 600 MG TABLET    Take 1 tablet (600 mg total) by mouth 2 (two) times daily.    Allergies: Allergies  Allergen Reactions  . Prunus Persica Swelling  . Pseudoephedrine Hcl  Swelling  . Sudafed [Pseudoephedrine] Swelling and Rash  . Food Swelling and Other (See Comments)    Pt states that he is allergic to peaches.    Past Medical History: Past Medical History:  Diagnosis Date  . Aggression   . Alcohol abuse   . Asthma   . HIV disease (HCC) 03/11/2019  . Homeless   . Hypertension   . Schizophrenia [facility])     Social History: Social History   Socioeconomic History  . Marital status: Married    Spouse name: Not on file  . Number of children: Not on file  . Years of education: Not on file  . Highest education level: Not on file  Occupational History  . Not on file  Tobacco Use  . Smoking status: Current Every Day Smoker  . Smokeless tobacco: Never Used  Substance and Sexual Activity  . Alcohol use: Yes  . Drug use: Yes    Types: Cocaine, Marijuana, Hydrocodone, MDMA (Ecstacy)    Comment: former  . Sexual activity: Yes    Comment: Patient reports he "has fucked half the bitches in [place]"  Other Topics Concern  . Not on file  Social History Narrative  . Not on file   Social Determinants of Health   Financial Resource Strain:   . Difficulty of Paying Living Expenses: Not  on file  Food Insecurity:   . Worried About Charity fundraiser in the Last Year: Not on file  . Ran Out of Food in the Last Year: Not on file  Transportation Needs:   . Lack of Transportation (Medical): Not on file  . Lack of Transportation (Non-Medical): Not on file  Physical Activity:   . Days of Exercise per Week: Not on file  . Minutes of Exercise per Session: Not on file  Stress:   . Feeling of Stress : Not on file  Social Connections:   . Frequency of Communication with Friends and Family: Not on file  . Frequency of Social Gatherings with Friends and Family: Not on file  . Attends Religious Services: Not on file  . Active Member of Clubs or Organizations: Not on file  . Attends Archivist Meetings: Not on file  . Marital Status: Not on file      Labs: Lab Results  Component Value Date   HIV1RNAQUANT 57,400 (H) 01/30/2019   CD4TABS 261 (L) 01/30/2019    RPR and STI Lab Results  Component Value Date   LABRPR NON-REACTIVE 01/30/2019   LABRPR NON REACTIVE 03/14/2009    STI Results GC CT  01/30/2019 Positive(A) Negative  12/25/2007 - NEGATIVERTFDELIM(NOTE)  Testing performed using the BD Probetec ET Chlamydia trachomatis and Neisseria gonorrhea amplified DNA assay.    Hepatitis B Lab Results  Component Value Date   HEPBSAB REACTIVE (A) 01/30/2019   HEPBSAG NON-REACTIVE 01/30/2019   HEPBCAB NON-REACTIVE 01/30/2019   Hepatitis C Lab Results  Component Value Date   HEPCAB NON-REACTIVE 01/30/2019   Hepatitis A Lab Results  Component Value Date   HAV NON-REACTIVE 01/30/2019   Lipids: Lab Results  Component Value Date   CHOL 143 01/30/2019   TRIG 91 01/30/2019   HDL 34 (L) 01/30/2019   CHOLHDL 4.2 01/30/2019   VLDL 37 03/14/2009   LDLCALC 90 01/30/2019    Current HIV Regimen: Treatment naive  Assessment: Mr. Cull is here today to initiate care for newly diagnosed HIV infection.  Patient is treatment naive with an initial HIV viral load of 57,400 and a CD4 count of 261.  No resistance mutations found on initial genotype. Will start patient on [place].  Patient is currently taking Trileptal, lithium, and Haldol for seizures and his bipolar disorder. Trileptal has many drug interactions with HIV medication options and we began discussing other antiepileptic options with him such as Keppra. Mr. Scheiber stated his Trileptal was just for sleep and he could easily stop taking it but he then endorsed suffering from seizures in the past. We encouraged Mr. Aceituno to continue taking his medications to prevent further seizures and patient became doubtful and called his mother. After speaking with patient's mother regarding the drug interactions she was agreeable to switching patient to [place] and starting [place].    Counseled patient on side effects of Biktarvy including "start up syndrome" of nausea/dizziness/fatigue/diarrhea as well as general nausea and fatigue when starting therapy. Mr. Verrette stated he takes iron but it may be vitamin d or fish oil. Counseled patient to separate his supplements from [place] by at least four hours. Mr. Lehnen also stated he only takes his medications at night. Because Keppra is twice daily, recommended patient take Biktarvy and his first dose of Keppra in the morning and then take his vitamins, second dose of Keppra, and other medications in the evening. We gave Mr. Leisure a pill box to aid in this and  patient seemed agreeable. Emphasized the importance of adherence to [facility] in order to become undetectable and maintain that status. Will follow-up with Mr. Marsico in a week to ensure he is tolerating his new medications and able to take them appropriately.   Plan: - Stop Trileptal and start Keppra 500 mg twice daily - Start taking Biktarvy  - Follow up with Cassie on 1/27 at 1:45 pm  Wess Botts, PharmD [facility] 03/11/2019, 9:51 AM

## 2019-03-11 NOTE — Patient Instructions (Signed)
Please pick up  Keppra and take 500mg  twice daily  STOP Trileptal  Start Biktarvy one pill once daily  Please make an appointment with in next 1-2 weeks and bring ALL of your medicines in their bottles

## 2019-03-11 NOTE — Telephone Encounter (Signed)
Received fax from [facility] confirming the complete stop of trileptal and switch to keppra, no taper needed. RN confirmed in person with pharmacy student Wess Botts and in Dr Clinton Gallant note. Relayed to [facility] pharmacist. Andree Coss, RN

## 2019-03-19 ENCOUNTER — Ambulatory Visit: Payer: Medicaid Other | Admitting: Pharmacist

## 2019-03-25 ENCOUNTER — Encounter: Payer: Self-pay | Admitting: Infectious Disease

## 2019-03-26 ENCOUNTER — Ambulatory Visit: Payer: Medicaid Other | Admitting: Pharmacist

## 2019-04-17 ENCOUNTER — Encounter: Payer: Self-pay | Admitting: Infectious Disease

## 2019-04-17 ENCOUNTER — Other Ambulatory Visit: Payer: Self-pay

## 2019-04-17 ENCOUNTER — Ambulatory Visit (INDEPENDENT_AMBULATORY_CARE_PROVIDER_SITE_OTHER): Payer: Medicaid Other | Admitting: Infectious Disease

## 2019-04-17 ENCOUNTER — Other Ambulatory Visit (HOSPITAL_COMMUNITY)
Admission: RE | Admit: 2019-04-17 | Discharge: 2019-04-17 | Disposition: A | Discharge status: Home / Self Care | Payer: Medicaid Other | Source: Ambulatory Visit | Attending: Infectious Disease | Admitting: Infectious Disease

## 2019-04-17 VITALS — BP 124/75 | HR 96 | Temp 97.8°F | Wt 197.0 lb

## 2019-04-17 DIAGNOSIS — F250 Schizoaffective disorder, bipolar type: Secondary | ICD-10-CM

## 2019-04-17 DIAGNOSIS — B20 Human immunodeficiency virus [HIV] disease: Secondary | ICD-10-CM | POA: Insufficient documentation

## 2019-04-17 DIAGNOSIS — R45850 Homicidal ideations: Secondary | ICD-10-CM

## 2019-04-17 DIAGNOSIS — F919 Conduct disorder, unspecified: Secondary | ICD-10-CM | POA: Diagnosis not present

## 2019-04-17 DIAGNOSIS — F1910 Other psychoactive substance abuse, uncomplicated: Secondary | ICD-10-CM

## 2019-04-17 DIAGNOSIS — F1220 Cannabis dependence, uncomplicated: Secondary | ICD-10-CM

## 2019-04-17 DIAGNOSIS — Z72811 Adult antisocial behavior: Secondary | ICD-10-CM

## 2019-04-17 NOTE — Progress Notes (Signed)
Subjective:   Chief complaint: some blurry vision at times   Patient ID: [name] [name], male    DOB: [birth year], 44 y.o.   MRN: [id number]  HPI  44 year old African-American man with recently diagnosed HIV disease who has a myriad of psychiatric problems including schizophrenia, antisocial behavior problems with cocaine abuse and hypersexuality who was recently diagnosed with HIV disease.  His viral load was in the 50,000 range with a wild-type virus.  He is hepatitis B surface antigen negative HLA B5 701 -.  He was  on Trileptal which is a major problem for most of the antiretrovirals   We change him off of that to [place] and place him on [place].  Did have gonorrhea diagnosis and he was given ceftriaxone and azithromycin in December for this.  He was supposed to follow-up with Dorothea Ogle and pharmacy to see how he was doing with his medications but he did not show up.  New he has shown up today and claims he is taking the Biktarvy and has not taken the Trileptal.  He has had some strange symptoms of seeing blurry waves in his vision at times but not constantly.  He is anxious to be tested for syphilis.     Past Medical History:  Diagnosis Date  . Aggression   . Alcohol abuse   . Asthma   . HIV disease ([place]) 03/11/2019  . Homeless   . Hypertension   . Schizophrenia [facility])     Past Surgical History:  Procedure Laterality Date  . KNEE SURGERY    . MOUTH SURGERY      No family history on file.    Social History   Socioeconomic History  . Marital status: Married    Spouse name: Not on file  . Number of children: Not on file  . Years of education: Not on file  . Highest education level: Not on file  Occupational History  . Not on file  Tobacco Use  . Smoking status: Current Every Day Smoker  . Smokeless tobacco: Never Used  Substance and Sexual Activity  . Alcohol use: Yes  . Drug use: Yes    Types: Cocaine, Marijuana, Hydrocodone, MDMA (Ecstacy)    Comment:  former  . Sexual activity: Yes    Comment: Patient reports he "has fucked half the bitches in [place]"  Other Topics Concern  . Not on file  Social History Narrative  . Not on file   Social Determinants of Health   Financial Resource Strain:   . Difficulty of Paying Living Expenses: Not on file  Food Insecurity:   . Worried About Charity fundraiser in the Last Year: Not on file  . Ran Out of Food in the Last Year: Not on file  Transportation Needs:   . Lack of Transportation (Medical): Not on file  . Lack of Transportation (Non-Medical): Not on file  Physical Activity:   . Days of Exercise per Week: Not on file  . Minutes of Exercise per Session: Not on file  Stress:   . Feeling of Stress : Not on file  Social Connections:   . Frequency of Communication with Friends and Family: Not on file  . Frequency of Social Gatherings with Friends and Family: Not on file  . Attends Religious Services: Not on file  . Active Member of Clubs or Organizations: Not on file  . Attends Archivist Meetings: Not on file  . Marital Status: Not on file    Allergies  Allergen Reactions  . Prunus Persica Swelling  . Pseudoephedrine Hcl Swelling  . Sudafed [Pseudoephedrine] Swelling and Rash  . Food Swelling and Other (See Comments)    Pt states that he is allergic to peaches.     Current Outpatient Medications:  .  amLODipine (NORVASC) 2.5 MG tablet, Take 2.5 mg by mouth daily., Disp: , Rfl:  .  benztropine (COGENTIN) 1 MG tablet, Take 1 tablet (1 mg total) by mouth 2 (two) times daily., Disp: 20 tablet, Rfl: 0 .  bictegravir-emtricitabine-tenofovir AF (BIKTARVY) 50-200-25 MG TABS tablet, Take 1 tablet by mouth daily., Disp: 30 tablet, Rfl: 11 .  chlorproMAZINE (THORAZINE) 100 MG tablet, Take 1 tablet (100 mg total) by mouth every morning AND 3 tablets (300 mg total) every evening. (Patient taking differently: Take 2 tablet (200 mg) at bedtime), Disp: 38 tablet, Rfl: 0 .   haloperidol (HALDOL) 10 MG tablet, Take 20 mg by mouth at bedtime., Disp: , Rfl:  .  haloperidol (HALDOL) 5 MG tablet, Take 5 mg by mouth every 6 (six) hours as needed for agitation. , Disp: , Rfl:  .  haloperidol decanoate (HALDOL DECANOATE) 100 MG/ML injection, Inject 200 mg into the muscle every 28 (twenty-eight) days. , Disp: , Rfl:  .  ibuprofen (ADVIL,MOTRIN) 800 MG tablet, Take 1 tablet (800 mg total) by mouth every 8 (eight) hours as needed., Disp: 15 tablet, Rfl: 0 .  levETIRAcetam (KEPPRA) 500 MG tablet, Take 1 tablet (500 mg total) by mouth 2 (two) times daily., Disp: 60 tablet, Rfl: 11 .  lithium 300 MG tablet, Take 1 tablet (300 mg total) by mouth 3 (three) times daily. (Patient taking differently: Take 900 mg by mouth at bedtime. ), Disp: 30 tablet, Rfl: 0  Current Facility-Administered Medications:  .  azithromycin (ZITHROMAX) tablet 250 mg, 250 mg, Oral, Once, Dixon, Melton Krebs, NP   Review of Systems  Unable to perform ROS: Psychiatric disorder       Objective:   Physical Exam Constitutional:      General: He is not in acute distress.    Appearance: Normal appearance. He is well-developed. He is not ill-appearing or diaphoretic.  HENT:     Head: Normocephalic and atraumatic.     Right Ear: Hearing and external ear normal.     Left Ear: Hearing and external ear normal.     Nose: No nasal deformity or rhinorrhea.  Eyes:     General: No scleral icterus.    Conjunctiva/sclera: Conjunctivae normal.     Right eye: Right conjunctiva is not injected.     Left eye: Left conjunctiva is not injected.  Neck:     Vascular: No JVD.  Cardiovascular:     Rate and Rhythm: Normal rate and regular rhythm.     Heart sounds: S1 normal and S2 normal.  Pulmonary:     Effort: Pulmonary effort is normal. No respiratory distress.  Abdominal:     Palpations: Abdomen is soft.     Tenderness: There is no abdominal tenderness.  Musculoskeletal:        General: Normal range of motion.      Right shoulder: Normal.     Left shoulder: Normal.     Cervical back: Normal range of motion and neck supple.     Right hip: Normal.     Left hip: Normal.     Right knee: Normal.     Left knee: Normal.  Lymphadenopathy:     Head:     Right  side of head: No submandibular, preauricular or posterior auricular adenopathy.     Left side of head: No submandibular, preauricular or posterior auricular adenopathy.     Cervical: No cervical adenopathy.     Right cervical: No superficial or deep cervical adenopathy.    Left cervical: No superficial or deep cervical adenopathy.  Skin:    General: Skin is warm and dry.     Coloration: Skin is not pale.     Findings: No abrasion, bruising, ecchymosis, erythema, lesion or rash.     Nails: There is no clubbing.  Neurological:     Mental Status: He is alert and oriented to person, place, and time.     Sensory: No sensory deficit.     Coordination: Coordination normal.     Gait: Gait normal.  Psychiatric:        Attention and Perception: He is attentive.        Mood and Affect: Mood is anxious.        Speech: Speech normal.        Behavior: Behavior is hyperactive. Behavior is cooperative.        Cognition and Memory: Cognition is impaired.     was much more calm today when and when I first saw him       Assessment & Plan:   HIV disease continue Biktarvy and checking labs today  Schizophrenia, question bipolar disorder: As mentioned switched him over to Keppra  Seizure disorder: As mentioned switching out Trileptal for Keppra  Substance abuse: Large he has problems with cocaine which apparently causes him to have problems with hypersexuality.  Antisocial personality problems in the past with depression and homicidal ideation suicidal ideation: No evidence of suicidal or homicidal thoughts today did not seem as delusional as he has been in the past.  He was in a much more calm mood today when I saw him and I do not think any setting  thing and appropriate.  Do need to make sure that staff are aware when he comes to the clinic to be prepared to make sure that we keep Safe and make sure that he behaves appropriately.  Today certainly feel very motivated to get his HIV under control.  He again refused all vaccinations

## 2019-04-18 LAB — T-HELPER CELL (CD4) - (RCID CLINIC ONLY)
CD4 % Helper T Cell: 32 % — ABNORMAL LOW (ref 33–65)
CD4 T Cell Abs: 406 /uL (ref 400–1790)

## 2019-04-18 LAB — URINE CYTOLOGY ANCILLARY ONLY
Chlamydia: NEGATIVE
Comment: NEGATIVE
Comment: NORMAL
Neisseria Gonorrhea: NEGATIVE

## 2019-04-23 LAB — HIV RNA, RTPCR W/R GT (RTI, PI,INT)
HIV 1 RNA Quant: <20 copies/mL
HIV-1 RNA Quant, Log: <1.3 Log copies/mL

## 2019-04-23 LAB — COMPLETE METABOLIC PANEL WITH GFR
AG Ratio: 1.8 (calc) (ref 1.0–2.5)
ALT: 13 U/L (ref 9–46)
AST: 14 U/L (ref 10–40)
Albumin: 4.3 g/dL (ref 3.6–5.1)
Alkaline phosphatase (APISO): 97 U/L (ref 36–130)
BUN: 15 mg/dL (ref 7–25)
CO2: 29 mmol/L (ref 20–32)
Calcium: 10 mg/dL (ref 8.6–10.3)
Chloride: 108 mmol/L (ref 98–110)
Creat: 1.28 mg/dL (ref 0.60–1.35)
GFR, Est African American: 79 mL/min/{1.73_m2} (ref >=60)
GFR, Est Non African American: 69 mL/min/{1.73_m2} (ref >=60)
Globulin: 2.4 g/dL (calc) (ref 1.9–3.7)
Glucose, Bld: 113 mg/dL — ABNORMAL HIGH (ref 65–99)
Potassium: 3.9 mmol/L (ref 3.5–5.3)
Sodium: 140 mmol/L (ref 135–146)
Total Bilirubin: 0.7 mg/dL (ref 0.2–1.2)
Total Protein: 6.7 g/dL (ref 6.1–8.1)

## 2019-04-23 LAB — CBC WITH DIFFERENTIAL/PLATELET
Absolute Monocytes: 437 cells/uL (ref 200–950)
Basophils Absolute: 21 cells/uL (ref 0–200)
Basophils Relative: 0.4 %
Eosinophils Absolute: 78 cells/uL (ref 15–500)
Eosinophils Relative: 1.5 %
HCT: 45 % (ref 38.5–50.0)
Hemoglobin: 15.2 g/dL (ref 13.2–17.1)
Lymphs Abs: 1295 cells/uL (ref 850–3900)
MCH: 31.5 pg (ref 27.0–33.0)
MCHC: 33.8 g/dL (ref 32.0–36.0)
MCV: 93.2 fL (ref 80.0–100.0)
MPV: 10.7 fL (ref 7.5–12.5)
Monocytes Relative: 8.4 %
Neutro Abs: 3370 cells/uL (ref 1500–7800)
Neutrophils Relative %: 64.8 %
Platelets: 193 10*3/uL (ref 140–400)
RBC: 4.83 10*6/uL (ref 4.20–5.80)
RDW: 11.9 % (ref 11.0–15.0)
Total Lymphocyte: 24.9 %
WBC: 5.2 10*3/uL (ref 3.8–10.8)

## 2019-04-23 LAB — RPR: RPR Ser Ql: NONREACTIVE

## 2019-04-25 ENCOUNTER — Telehealth: Payer: Self-pay | Admitting: *Deleted

## 2019-04-25 NOTE — Telephone Encounter (Signed)
Relayed results to patient. Confirmed upcoming appointment. Andree Coss, RN

## 2019-04-25 NOTE — Telephone Encounter (Signed)
-----   Message from Randall Hiss, MD sent at 04/23/2019  8:31 AM EST ----- [name] [name] is undetectable! He should be proud

## 2019-05-19 ENCOUNTER — Ambulatory Visit (INDEPENDENT_AMBULATORY_CARE_PROVIDER_SITE_OTHER): Payer: Medicaid Other | Admitting: Infectious Disease

## 2019-05-19 ENCOUNTER — Encounter: Payer: Self-pay | Admitting: Infectious Disease

## 2019-05-19 ENCOUNTER — Other Ambulatory Visit: Payer: Self-pay

## 2019-05-19 ENCOUNTER — Other Ambulatory Visit (HOSPITAL_COMMUNITY)
Admission: RE | Admit: 2019-05-19 | Discharge: 2019-05-19 | Disposition: A | Discharge status: Home / Self Care | Payer: Medicaid Other | Source: Ambulatory Visit | Attending: Infectious Disease | Admitting: Infectious Disease

## 2019-05-19 VITALS — BP 127/86 | HR 88 | Temp 98.3°F | Wt 196.0 lb

## 2019-05-19 DIAGNOSIS — B20 Human immunodeficiency virus [HIV] disease: Secondary | ICD-10-CM | POA: Diagnosis not present

## 2019-05-19 DIAGNOSIS — F1910 Other psychoactive substance abuse, uncomplicated: Secondary | ICD-10-CM

## 2019-05-19 DIAGNOSIS — R369 Urethral discharge, unspecified: Secondary | ICD-10-CM | POA: Diagnosis not present

## 2019-05-19 DIAGNOSIS — F250 Schizoaffective disorder, bipolar type: Secondary | ICD-10-CM

## 2019-05-19 DIAGNOSIS — Z72811 Adult antisocial behavior: Secondary | ICD-10-CM | POA: Diagnosis not present

## 2019-05-19 HISTORY — DX: Urethral discharge, unspecified: R36.9

## 2019-05-19 MED ORDER — AZITHROMYCIN 250 MG PO TABS
1000.0000 mg | ORAL_TABLET | Freq: Once | ORAL | Status: AC
  Administered 2019-05-19: 1000 mg via ORAL

## 2019-05-19 MED ORDER — CEFTRIAXONE SODIUM 250 MG IJ SOLR
500.0000 mg | Freq: Once | INTRAMUSCULAR | Status: AC
  Administered 2019-05-19: 500 mg via INTRAMUSCULAR

## 2019-05-19 NOTE — Addendum Note (Signed)
Addended by: Lorenso Courier on: 05/19/2019 10:50 AM   Modules accepted: Orders

## 2019-05-19 NOTE — Progress Notes (Signed)
Subjective:   Chief complaint: Penile discharge   Patient ID: [name] [name], male    DOB: 1975/03/20, 44 y.o.   MRN: [id number]  HPI  44 year old African-American man with recently diagnosed HIV disease who has a myriad of psychiatric problems including schizophrenia, antisocial behavior problems with cocaine abuse and hypersexuality who was recently diagnosed with HIV disease.  His viral load was in the 50,000 range with a wild-type virus.  He is hepatitis B surface antigen negative HLA B5 701 -.  He was  on Trileptal which is a major problem for most of the antiretrovirals   We change him off of that to [place] and place him on [place].  Did have gonorrhea diagnosis and he was given ceftriaxone and azithromycin in December for this.  He was supposed to follow-up with Dorothea Ogle and pharmacy to see how he was doing with his medications but he did not show up.  New he has shown up today and claims he is taking the Biktarvy and has not taken the Trileptal.  He has had some strange symptoms of seeing blurry waves in his vision at times but not constantly.  We saw him in February his viral load was undetectable which was quite a relief.  Today he returns to clinic for follow-up.  He is complaining of discharge from his penis.  In talking to him he is married but does admit to having other sexual partners.       Past Medical History:  Diagnosis Date  . Aggression   . Alcohol abuse   . Asthma   . HIV disease ([place]) 03/11/2019  . Homeless   . Hypertension   . Penile discharge 05/19/2019  . Schizophrenia [facility])     Past Surgical History:  Procedure Laterality Date  . KNEE SURGERY    . MOUTH SURGERY      No family history on file.    Social History   Socioeconomic History  . Marital status: Married    Spouse name: Not on file  . Number of children: Not on file  . Years of education: Not on file  . Highest education level: Not on file  Occupational History  . Not on  file  Tobacco Use  . Smoking status: Current Every Day Smoker  . Smokeless tobacco: Never Used  Substance and Sexual Activity  . Alcohol use: Yes  . Drug use: Yes    Types: Cocaine, Marijuana, Hydrocodone, MDMA (Ecstacy)    Comment: former  . Sexual activity: Yes    Comment: Patient reports he "has fucked half the bitches in [place]"  Other Topics Concern  . Not on file  Social History Narrative  . Not on file   Social Determinants of Health   Financial Resource Strain:   . Difficulty of Paying Living Expenses:   Food Insecurity:   . Worried About Charity fundraiser in the Last Year:   . Arboriculturist in the Last Year:   Transportation Needs:   . Film/video editor (Medical):   Marland Kitchen Lack of Transportation (Non-Medical):   Physical Activity:   . Days of Exercise per Week:   . Minutes of Exercise per Session:   Stress:   . Feeling of Stress :   Social Connections:   . Frequency of Communication with Friends and Family:   . Frequency of Social Gatherings with Friends and Family:   . Attends Religious Services:   . Active Member of Clubs or Organizations:   .  Attends Archivist Meetings:   Marland Kitchen Marital Status:     Allergies  Allergen Reactions  . Prunus Persica Swelling  . Pseudoephedrine Hcl Swelling  . Sudafed [Pseudoephedrine] Swelling and Rash  . Food Swelling and Other (See Comments)    Pt states that he is allergic to peaches.     Current Outpatient Medications:  .  benztropine (COGENTIN) 1 MG tablet, Take 1 tablet (1 mg total) by mouth 2 (two) times daily., Disp: 20 tablet, Rfl: 0 .  haloperidol decanoate (HALDOL DECANOATE) 100 MG/ML injection, Inject 200 mg into the muscle every 28 (twenty-eight) days. , Disp: , Rfl:  .  lithium 300 MG tablet, Take 1 tablet (300 mg total) by mouth 3 (three) times daily. (Patient taking differently: Take 900 mg by mouth at bedtime. ), Disp: 30 tablet, Rfl: 0 .  amLODipine (NORVASC) 2.5 MG tablet, Take 2.5 mg by  mouth daily., Disp: , Rfl:  .  bictegravir-emtricitabine-tenofovir AF (BIKTARVY) 50-200-25 MG TABS tablet, Take 1 tablet by mouth daily., Disp: 30 tablet, Rfl: 11 .  chlorproMAZINE (THORAZINE) 100 MG tablet, Take 1 tablet (100 mg total) by mouth every morning AND 3 tablets (300 mg total) every evening. (Patient taking differently: Take 2 tablet (200 mg) at bedtime), Disp: 38 tablet, Rfl: 0 .  haloperidol (HALDOL) 10 MG tablet, Take 20 mg by mouth at bedtime., Disp: , Rfl:  .  haloperidol (HALDOL) 5 MG tablet, Take 5 mg by mouth every 6 (six) hours as needed for agitation. , Disp: , Rfl:  .  ibuprofen (ADVIL,MOTRIN) 800 MG tablet, Take 1 tablet (800 mg total) by mouth every 8 (eight) hours as needed., Disp: 15 tablet, Rfl: 0 .  levETIRAcetam (KEPPRA) 500 MG tablet, Take 1 tablet (500 mg total) by mouth 2 (two) times daily., Disp: 60 tablet, Rfl: 11  Current Facility-Administered Medications:  .  azithromycin (ZITHROMAX) tablet 250 mg, 250 mg, Oral, Once, Dixon, Melton Krebs, NP   Review of Systems  Unable to perform ROS: Psychiatric disorder       Objective:   Physical Exam Constitutional:      General: He is not in acute distress.    Appearance: Normal appearance. He is well-developed. He is not ill-appearing or diaphoretic.  HENT:     Head: Normocephalic and atraumatic.     Right Ear: Hearing and external ear normal.     Left Ear: Hearing and external ear normal.     Nose: No nasal deformity or rhinorrhea.  Eyes:     General: No scleral icterus.    Conjunctiva/sclera: Conjunctivae normal.     Right eye: Right conjunctiva is not injected.     Left eye: Left conjunctiva is not injected.  Neck:     Vascular: No JVD.  Cardiovascular:     Rate and Rhythm: Normal rate and regular rhythm.     Heart sounds: S1 normal and S2 normal.  Pulmonary:     Effort: Pulmonary effort is normal. No respiratory distress.  Abdominal:     Palpations: Abdomen is soft.     Tenderness: There is no  abdominal tenderness.  Musculoskeletal:        General: Normal range of motion.     Right shoulder: Normal.     Left shoulder: Normal.     Cervical back: Normal range of motion and neck supple.     Right hip: Normal.     Left hip: Normal.     Right knee: Normal.  Left knee: Normal.  Lymphadenopathy:     Head:     Right side of head: No submandibular, preauricular or posterior auricular adenopathy.     Left side of head: No submandibular, preauricular or posterior auricular adenopathy.     Cervical: No cervical adenopathy.     Right cervical: No superficial or deep cervical adenopathy.    Left cervical: No superficial or deep cervical adenopathy.  Skin:    General: Skin is warm and dry.     Coloration: Skin is not pale.     Findings: No abrasion, bruising, ecchymosis, erythema, lesion or rash.     Nails: There is no clubbing.  Neurological:     General: No focal deficit present.     Mental Status: He is alert and oriented to person, place, and time.     Sensory: No sensory deficit.     Coordination: Coordination normal.     Gait: Gait normal.  Psychiatric:        Attention and Perception: He is attentive.        Mood and Affect: Mood is anxious.        Speech: Speech normal.        Behavior: Behavior is hyperactive. Behavior is cooperative.        Cognition and Memory: Cognition is impaired.     was much more calm today when and when I first saw him       Assessment & Plan:   HIV disease continue Biktarvy and checking viral load again now  Schizophrenia, question bipolar disorder: As mentioned switched him over to Keppra  Seizure disorder: As mentioned switching out Trileptal for Keppra  Substance abuse: Large he has problems with cocaine which apparently causes him to have problems with hypersexuality.  Antisocial personality problems has been fairly calm in clinic last few times I have seen him  Penile discharge: We will treat with ceftriaxone and azithromycin  test for gonorrhea and chlamydia also check a syphilis test  Counseled him that while taking Biktarvy does suppress his virus and prevent him from transmitting HIV he can still transmit and get sexually transmitted infections less uses condoms

## 2019-05-20 ENCOUNTER — Telehealth: Payer: Self-pay

## 2019-05-20 LAB — URINE CYTOLOGY ANCILLARY ONLY
Chlamydia: NEGATIVE
Comment: NEGATIVE
Comment: NORMAL
Neisseria Gonorrhea: POSITIVE — AB

## 2019-05-20 NOTE — Telephone Encounter (Signed)
Called patient regarding pos STD lab. Patient was able to take call; understands he must inform recent partners to get tested/treated at local health department.  [name] [name], [place]

## 2019-05-20 NOTE — Telephone Encounter (Signed)
-----   Message from Randall Hiss, MD sent at 05/20/2019  3:03 PM EDT ----- Patient DID have GC. He was treated but his wife and sexual partners need to also be tested/treated  He DOES have a history of aggressive behaviors though has been more calm in clinic recently

## 2019-05-22 LAB — RPR: RPR Ser Ql: NONREACTIVE

## 2019-05-22 LAB — HIV-1 RNA QUANT-NO REFLEX-BLD
HIV 1 RNA Quant: <20 copies/mL
HIV-1 RNA Quant, Log: <1.3 Log copies/mL

## 2019-06-18 ENCOUNTER — Encounter (HOSPITAL_COMMUNITY): Payer: Self-pay | Admitting: *Deleted

## 2019-06-18 ENCOUNTER — Emergency Department (HOSPITAL_COMMUNITY)
Admission: EM | Admit: 2019-06-18 | Discharge: 2019-06-18 | Disposition: A | Discharge status: Home / Self Care | Payer: Medicaid Other | Attending: Emergency Medicine | Admitting: Emergency Medicine

## 2019-06-18 DIAGNOSIS — X58XXXA Exposure to other specified factors, initial encounter: Secondary | ICD-10-CM | POA: Insufficient documentation

## 2019-06-18 DIAGNOSIS — Z5321 Procedure and treatment not carried out due to patient leaving prior to being seen by health care provider: Secondary | ICD-10-CM | POA: Diagnosis not present

## 2019-06-18 DIAGNOSIS — Y9389 Activity, other specified: Secondary | ICD-10-CM | POA: Diagnosis not present

## 2019-06-18 DIAGNOSIS — T189XXA Foreign body of alimentary tract, part unspecified, initial encounter: Secondary | ICD-10-CM | POA: Diagnosis not present

## 2019-06-18 DIAGNOSIS — Y999 Unspecified external cause status: Secondary | ICD-10-CM | POA: Diagnosis not present

## 2019-06-18 DIAGNOSIS — Y929 Unspecified place or not applicable: Secondary | ICD-10-CM | POA: Diagnosis not present

## 2019-06-18 NOTE — ED Triage Notes (Addendum)
Pt arrived by [facility]. Was smoking crack and reports "swallowing the crack rock" no distress is noted on arrival.

## 2019-06-18 NOTE — ED Notes (Signed)
Pt stated that he can not wait and took blood pressure cuff off and left.

## 2019-06-28 ENCOUNTER — Encounter (HOSPITAL_COMMUNITY): Payer: Self-pay | Admitting: Emergency Medicine

## 2019-06-28 ENCOUNTER — Emergency Department (HOSPITAL_COMMUNITY)
Admission: EM | Admit: 2019-06-28 | Discharge: 2019-06-28 | Disposition: A | Discharge status: Home / Self Care | Payer: Medicaid Other | Attending: Emergency Medicine | Admitting: Emergency Medicine

## 2019-06-28 DIAGNOSIS — F17200 Nicotine dependence, unspecified, uncomplicated: Secondary | ICD-10-CM | POA: Diagnosis not present

## 2019-06-28 DIAGNOSIS — Z21 Asymptomatic human immunodeficiency virus [HIV] infection status: Secondary | ICD-10-CM | POA: Insufficient documentation

## 2019-06-28 DIAGNOSIS — R45851 Suicidal ideations: Secondary | ICD-10-CM | POA: Insufficient documentation

## 2019-06-28 DIAGNOSIS — Z046 Encounter for general psychiatric examination, requested by authority: Secondary | ICD-10-CM | POA: Insufficient documentation

## 2019-06-28 DIAGNOSIS — R451 Restlessness and agitation: Secondary | ICD-10-CM | POA: Diagnosis not present

## 2019-06-28 DIAGNOSIS — I10 Essential (primary) hypertension: Secondary | ICD-10-CM | POA: Insufficient documentation

## 2019-06-28 MED ORDER — HALOPERIDOL LACTATE 5 MG/ML IJ SOLN
5.0000 mg | Freq: Once | INTRAMUSCULAR | Status: AC
  Administered 2019-06-28: 5 mg via INTRAMUSCULAR
  Filled 2019-06-28: qty 1

## 2019-06-28 MED ORDER — DIPHENHYDRAMINE HCL 50 MG/ML IJ SOLN
50.0000 mg | Freq: Once | INTRAMUSCULAR | Status: AC
  Administered 2019-06-28: 50 mg via INTRAMUSCULAR
  Filled 2019-06-28: qty 1

## 2019-06-28 MED ORDER — LORAZEPAM 2 MG/ML IJ SOLN
2.0000 mg | Freq: Once | INTRAMUSCULAR | Status: DC
  Filled 2019-06-28: qty 1

## 2019-06-28 NOTE — ED Provider Notes (Signed)
Emergency Department Provider Note   I have reviewed the triage vital signs and the nursing notes.   HISTORY  Chief Complaint Suicidal   HPI [name] [name] is a 44 y.o. male with PMH reviewed below presents to the ED with police after found walking through traffic.  He was acutely agitated and expressing suicidal thoughts.  Patient states that someone called him "stupid, dumb, and retarded" and police say that seems to have set him off.  He denies drinking or using drugs today.  He tells me he is not taking any medicines for 4 months. He tells me "I need a shot!"   Level 5 caveat: Acute agitation   Past Medical History:  Diagnosis Date  . Aggression   . Alcohol abuse   . Asthma   . HIV disease ([place]) 03/11/2019  . Homeless   . Hypertension   . Penile discharge 05/19/2019  . Schizophrenia [facility])     Patient Active Problem List   Diagnosis Date Noted  . Penile discharge 05/19/2019  . HIV disease ([place]) 03/11/2019  . Cocaine abuse ([place]) 10/04/2015  . Suicidal ideation 09/21/2015  . Cannabis use disorder, severe, dependence ([place]) 06/18/2015  . Behavior disturbance   . Schizoaffective disorder, bipolar type ([place]) 04/28/2015  . Adult antisocial behavior 04/04/2015  . Polysubstance abuse ([place]) 04/04/2015  . Homicidal ideation 10/16/2014  . Obstructive sleep apnea 10/16/2014    Past Surgical History:  Procedure Laterality Date  . KNEE SURGERY    . MOUTH SURGERY      Allergies Prunus persica, Pseudoephedrine hcl, Sudafed [pseudoephedrine], and Food  No family history on file.  Social History Social History   Tobacco Use  . Smoking status: Current Every Day Smoker  . Smokeless tobacco: Never Used  Substance Use Topics  . Alcohol use: Yes  . Drug use: Yes    Types: Cocaine, Marijuana, Hydrocodone, MDMA (Ecstacy)    Comment: former    Review of Systems  Constitutional: No fever/chills Cardiovascular: Denies chest pain. Respiratory: Denies shortness of  breath. Gastrointestinal: No abdominal pain. Neurological: Negative for headaches Psychiatric: Patient agitated and reports SI   10-point ROS otherwise negative.  ____________________________________________   PHYSICAL EXAM:  VITAL SIGNS: Vitals:   06/28/19 1458  BP: 132/84  Pulse: 73  Resp: 18  Temp: 97.7 F (36.5 C)  SpO2: 100%    Constitutional: Patient arrives to the triage area shouting and yelling.  He is handcuffed and with police but redirectable.  Eyes: Conjunctivae are normal.  Head: Atraumatic. Nose: No congestion/rhinnorhea. Mouth/Throat: Mucous membranes are moist. Neck: No stridor.  Cardiovascular: Normal rate, regular rhythm.  Respiratory: Normal respiratory effort.  Gastrointestinal: No distention.  Musculoskeletal: No gross deformities of extremities. Neurologic:  Normal speech and language. Ambulatory with steady gait.  Skin:  Skin is warm, dry and intact. No rash noted. Psychiatric: Shouting and agitated.   ____________________________________________   LABS (all labs ordered are listed, but only abnormal results are displayed)  None  ____________________________________________   PROCEDURES  Procedure(s) performed:   Procedures  CRITICAL CARE Performed by: Margette Fast Total critical care time: 35 minutes Critical care time was exclusive of separately billable procedures and treating other patients. Critical care was necessary to treat or prevent imminent or life-threatening deterioration. Critical care was time spent personally by me on the following activities: development of treatment plan with patient and/or surrogate as well as nursing, discussions with consultants, evaluation of patient's response to treatment, examination of patient, obtaining history from patient  or surrogate, ordering and performing treatments and interventions, ordering and review of laboratory studies, ordering and review of radiographic studies, pulse oximetry  and re-evaluation of patient's condition.  Alona Bene, MD Emergency Medicine  ____________________________________________   INITIAL IMPRESSION / ASSESSMENT AND PLAN / ED COURSE  Pertinent labs & imaging results that were available during my care of the patient were reviewed by me and considered in my medical decision making (see chart for details).   Patient arrives to the emergency department with acute onset agitation and reported suicidal ideation. He has multiple ED presentations for the same.  Will have TTS evaluate early and will give Haldol and Benadryl IM.   11:50 AM  Patient more calm and cooperative on reassessment. Police at bedside. Denies SI/HI. Will obs and reassess with some drowsiness.   01:48 PM  [employer] at bedside and patient has warrants.  They will be taking him into custody.  He is calm and cooperative.  He denies any suicidal or homicidal ideation after treatment here.  Do not feel he requires further work-up or management from behavioral health.  Will discharge to police custody.  ____________________________________________  FINAL CLINICAL IMPRESSION(S) / ED DIAGNOSES  Final diagnoses:  Agitation     MEDICATIONS GIVEN DURING THIS VISIT:  Medications  diphenhydrAMINE (BENADRYL) injection 50 mg (50 mg Intramuscular Given 06/28/19 1119)  LORazepam (ATIVAN) injection 2 mg (2 mg Intramuscular Given 06/28/19 1120)  haloperidol lactate (HALDOL) injection 5 mg (5 mg Intramuscular Given 06/28/19 1120)     Note:  This document was prepared using Dragon voice recognition software and may include unintentional dictation errors.  Alona Bene, MD, [facility] Emergency Medicine    Deonna Krummel, Arlyss Repress, MD 06/29/19 [phone]

## 2019-06-28 NOTE — ED Triage Notes (Signed)
Per pt, states he needs his shots-[employer] states he was running in and out of traffic stating he was going to kill him self-apparently someone called him "stupid and retarded" and that set him off

## 2019-06-28 NOTE — Discharge Instructions (Signed)
Substance Abuse Treatment Programs ° °Intensive Outpatient Programs °[facility]     °[address]      °[place], [place]                   °[phone]      ° °[facility] °[address] °[place], [place] °[phone] ° °[facility]     °(Inpatient and outpatient)     °[address]           °[phone]   ° °[facility] °336-288-1484 (Suboxone and Methadone) ° °[address]      °[place], [place] [address]      °336-882-2125      ° °[address] °[place], [place] °852-3033 ° °[facility] (Outpatient/Inpatient, [facility])    °(insurance only) [phone]      °       °[facility] (Groups & Residential) °[place], [place] °336-389-1413 ° °   °[facility]     °[address]     °[place], [place]      °[phone]      ° °[facility] (for caregivers and family) °[address] °[place], [place] °336-299-4655 ° ° ° ° ° °Residential Treatment Programs °[facility]      °[address], [place], [place] [address]  °([phone]      ° °[facility] °[address] [place], [place] [address] °[phone] ° °[facility]        °336-248-8914      ° °[facility] °1-800-659-3381 ° °[facility]             °[address]                                         °[place], [place]                                                °877-615-2722 or 336-784-9470                              ° °[facility] [place] °[address] °[place], [address] °1.877.941.8954 ° °[facility]    °[address]      °[place], [place]     °[phone]      ° °[facility] °[address] °[place], [place] °336-285-9073 ° °[facility]   °[address]     °[place], [place] [address]     °[phone]      °Admissions: 8am-3pm M-F ° °[facility] °[address] °[place],  [place] °336-227-7417 ° °[facility]: Residential Program (90 Days)   °[place], [place]      °336-725-8389 or 800-758-6077    ° °[facility] °[place], [place] °(Walk in Hours over the weekend or by referral) ° °[facility] °[address], [place],  [address] °([phone] [phone] ° °Crisis Mobile: [facility]:  1-877-626-1772 (for crisis response 24 hours a day) °[facility] Hotline:      1-800-256-2452 °Outpatient Psychiatry and Counseling ° °[facility]: Mobile Crisis   Management 24 hours:  1-877-626-1772 ° °[facility] sliding scale fee and walk in schedule: M-F 8am-12pm/1pm-3pm °[address]  °[place], [place] [address] °[phone] ° °[facility] °[address] °[place], [place] [address] °336-703-9650 ° °[facility] (Formerly known [facility])- new patient walk-in appointments available Monday - Friday 8am -3pm.          °[address] °[place], [place] [address] °336-676-6840 or crisis line- 336-676-6905 ° °[facility] Intensive Outpatient Therapy Program °[address] °[place], [place] [address] °336-832-9804 ° °[facility]                  °Crisis Services      °336.641.4993      °[address]     °[place], [place] [address]                ° °[facility]   °[facility] °[phone] °[address] °[place], [place] [address] ° ° °[facility]          °[address],  °[place], [place] [address]       °(336) [phone] ° °[facility] °[address] °[place], [place] [address] °336-292-1510 ° °[facility]    °[address]    °[place], [place] [address]     °336-632-3505      ° °Parish McKinney, MD     °[address]     °[place] [address]     °336-282-1251     °  °[facility] °[address] °[place] [address] ° °[facility]     °[address]     °[place], [place]      °336-542-2076      ° °[facility] °Shamsher Ahluwalia, MD °[address] °[place], [place] [address] °[phone] ° °[facility]     °[address]     °[place], [place] [address]     °336-274-1237      ° °[facility] °[address] °[place], [place] [address] °336-854-4450 °Resources for Temporary Residential Assistance/Crisis Centers ° °DAY CENTERS °[facility] °M-F 8am-3pm   °[address] [place], [place] [address]   336-332-0824 °Services include: laundry, barbering, support groups, case management, phone  & computer access, showers, AA/NA mtgs, mental health/substance abuse nurse, job skills class, disability information, VA assistance, spiritual classes, etc.  ° °HOMELESS SHELTERS ° °[facility]     °[facility]   °[address], [place]     °336.271.5959       °       °[facility] (women and children)       °[address] °[place], [place] [address] °336-275-0820 °[email or web address] for application and process °Application Required ° °[facility]   °[address]    °[place]  [address]     °[phone]       °             °[facility] °[address] °[place],  [address] °336.273.5572 °336-235-0363(schedule application appt.) °Application Required ° °[facility] (women only)    °[address]     °[place],  [address]     °336-884-1039      °  Intake starts 6pm daily °Need valid ID, SSC, & Police report °[facility] [place] °[address] °[place], [place] °336-881-5420 °Application Required ° °[facility] (men only)     °[address]      °[place], [place]     °[phone]      ° °[facility] °(Pregnant women only) °[address] °[place], [place] °336-275-0206 ° °[facility]      °[address]      °[place], [place] [address]     °336-722-9951      °       °[facility] °[address] °[place], [place] °336-723-1848 °90 day commitment/SA/Application process ° °[facility](men only)     °[address]     °[place], [place]     °336-748-1962       °Check-in at 7pm     °       °[facility] °[address] °[place], [place] [address] °336-248-6684 °Men/Women/Women and Children must be there by 7 pm ° °[facility] °[place], [place] °336-722-8721                ° °

## 2019-06-28 NOTE — ED Notes (Signed)
Per pt, states he did not want Ativan-states he just wants to go home and be with his dogs-denies HI/SI

## 2019-07-02 ENCOUNTER — Other Ambulatory Visit: Payer: Self-pay

## 2019-07-02 ENCOUNTER — Encounter (HOSPITAL_COMMUNITY): Payer: Self-pay

## 2019-07-02 ENCOUNTER — Emergency Department (HOSPITAL_COMMUNITY)
Admission: EM | Admit: 2019-07-02 | Discharge: 2019-07-02 | Attending: Emergency Medicine | Admitting: Emergency Medicine

## 2019-07-02 DIAGNOSIS — M79605 Pain in left leg: Secondary | ICD-10-CM | POA: Insufficient documentation

## 2019-07-02 DIAGNOSIS — Z79899 Other long term (current) drug therapy: Secondary | ICD-10-CM | POA: Insufficient documentation

## 2019-07-02 DIAGNOSIS — F17200 Nicotine dependence, unspecified, uncomplicated: Secondary | ICD-10-CM | POA: Insufficient documentation

## 2019-07-02 DIAGNOSIS — R55 Syncope and collapse: Secondary | ICD-10-CM | POA: Insufficient documentation

## 2019-07-02 DIAGNOSIS — I10 Essential (primary) hypertension: Secondary | ICD-10-CM | POA: Diagnosis not present

## 2019-07-02 LAB — BASIC METABOLIC PANEL
Anion gap: 7 (ref 5–15)
BUN: 11 mg/dL (ref 6–20)
CO2: 29 mmol/L (ref 22–32)
Calcium: 9.9 mg/dL (ref 8.9–10.3)
Chloride: 110 mmol/L (ref 98–111)
Creatinine, Ser: 1.25 mg/dL — ABNORMAL HIGH (ref 0.61–1.24)
GFR calc Af Amer: >60 mL/min (ref >60)
GFR calc non Af Amer: >60 mL/min (ref >60)
Glucose, Bld: 100 mg/dL — ABNORMAL HIGH (ref 70–99)
Potassium: 4 mmol/L (ref 3.5–5.1)
Sodium: 146 mmol/L — ABNORMAL HIGH (ref 135–145)

## 2019-07-02 LAB — CBC WITH DIFFERENTIAL/PLATELET
Abs Immature Granulocytes: 0.01 10*3/uL (ref 0.00–0.07)
Basophils Absolute: 0 10*3/uL (ref 0.0–0.1)
Basophils Relative: 0 %
Eosinophils Absolute: 0 10*3/uL (ref 0.0–0.5)
Eosinophils Relative: 1 %
HCT: 46.7 % (ref 39.0–52.0)
Hemoglobin: 15.2 g/dL (ref 13.0–17.0)
Immature Granulocytes: 0 %
Lymphocytes Relative: 17 %
Lymphs Abs: 1.2 10*3/uL (ref 0.7–4.0)
MCH: 31.7 pg (ref 26.0–34.0)
MCHC: 32.5 g/dL (ref 30.0–36.0)
MCV: 97.3 fL (ref 80.0–100.0)
Monocytes Absolute: 0.5 10*3/uL (ref 0.1–1.0)
Monocytes Relative: 7 %
Neutro Abs: 5.2 10*3/uL (ref 1.7–7.7)
Neutrophils Relative %: 75 %
Platelets: 212 10*3/uL (ref 150–400)
RBC: 4.8 MIL/uL (ref 4.22–5.81)
RDW: 11.4 % — ABNORMAL LOW (ref 11.5–15.5)
WBC: 6.9 10*3/uL (ref 4.0–10.5)
nRBC: 0 % (ref 0.0–0.2)

## 2019-07-02 LAB — CBG MONITORING, ED: Glucose-Capillary: 97 mg/dL (ref 70–99)

## 2019-07-02 NOTE — ED Triage Notes (Signed)
Pt arrives [facility] from jail. Per EMS: Pt was tased twice today and while being evaluated by [facility] pt became unresponsive in what seemed to be a syncopal episode. The [place] RN reports that the pts HR "increased to 150bpm" Pts vitals all within normal limits at this time.

## 2019-07-02 NOTE — ED Provider Notes (Signed)
[facility] DEPT Provider Note   CSN: [id number] Arrival date & time: 07/02/19  1715     History Chief Complaint  Patient presents with  . Near Syncope    [name] [name] is a 44 y.o. male.  Presents to ER from jail after unresponsive episode and what was suspected to be syncope.  Patient is a poor historian, states he does not recall the exact details of today.  Does report that he was tased in his left leg and has some mild discomfort at that area but otherwise does not have any acute complaints.  Specifically he denies any chest pain or difficulty in breathing.  Pt reports hx asthma but denies any cardiac hx.  Per EMS report: "Pt arrives [facility] from jail. Per EMS: Pt was tased twice today and while being evaluated by [facility] pt became unresponsive in what seemed to be a syncopal episode. The [place] RN reports that the pts HR "increased to 150bpm" Pts vitals all within normal limits at this time."  HPI     Past Medical History:  Diagnosis Date  . Aggression   . Alcohol abuse   . Asthma   . HIV disease ([place]) 03/11/2019  . Homeless   . Hypertension   . Penile discharge 05/19/2019  . Schizophrenia [facility])     Patient Active Problem List   Diagnosis Date Noted  . Penile discharge 05/19/2019  . HIV disease ([place]) 03/11/2019  . Cocaine abuse ([place]) 10/04/2015  . Suicidal ideation 09/21/2015  . Cannabis use disorder, severe, dependence ([place]) 06/18/2015  . Behavior disturbance   . Schizoaffective disorder, bipolar type ([place]) 04/28/2015  . Adult antisocial behavior 04/04/2015  . Polysubstance abuse ([place]) 04/04/2015  . Homicidal ideation 10/16/2014  . Obstructive sleep apnea 10/16/2014    Past Surgical History:  Procedure Laterality Date  . KNEE SURGERY    . MOUTH SURGERY         History reviewed. No pertinent family history.  Social History   Tobacco Use  . Smoking status: Current Every Day Smoker  . Smokeless tobacco: Never Used   Substance Use Topics  . Alcohol use: Yes  . Drug use: Yes    Types: Cocaine, Marijuana, Hydrocodone, MDMA (Ecstacy)    Comment: former    Home Medications Prior to Admission medications   Medication Sig Start Date End Date Taking? Authorizing Provider  amLODipine (NORVASC) 2.5 MG tablet Take 2.5 mg by mouth daily.    [provider]  benztropine (COGENTIN) 1 MG tablet Take 1 tablet (1 mg total) by mouth 2 (two) times daily. 05/19/17   Francine Graven, DO  bictegravir-emtricitabine-tenofovir AF (BIKTARVY) 50-200-25 MG TABS tablet Take 1 tablet by mouth daily. 03/11/19   Truman Hayward, MD  chlorproMAZINE (THORAZINE) 100 MG tablet Take 1 tablet (100 mg total) by mouth every morning AND 3 tablets (300 mg total) every evening. Patient taking differently: Take 2 tablet (200 mg) at bedtime 05/19/17   Francine Graven, DO  haloperidol (HALDOL) 10 MG tablet Take 20 mg by mouth at bedtime.    [provider]  haloperidol (HALDOL) 5 MG tablet Take 5 mg by mouth every 6 (six) hours as needed for agitation.  11/12/18   [provider]  haloperidol decanoate (HALDOL DECANOATE) 100 MG/ML injection Inject 200 mg into the muscle every 28 (twenty-eight) days.     [provider]  ibuprofen (ADVIL,MOTRIN) 800 MG tablet Take 1 tablet (800 mg total) by mouth every 8 (eight)  hours as needed. 03/31/18   Tilden Fossa, MD  levETIRAcetam (KEPPRA) 500 MG tablet Take 1 tablet (500 mg total) by mouth 2 (two) times daily. 03/11/19   Randall Hiss, MD  lithium 300 MG tablet Take 1 tablet (300 mg total) by mouth 3 (three) times daily. Patient taking differently: Take 900 mg by mouth at bedtime.  05/19/17   Samuel Jester, DO    Allergies    Prunus persica, Pseudoephedrine hcl, Sudafed [pseudoephedrine], and Food  Review of Systems   Review of Systems  Constitutional: Negative for chills and fever.  HENT: Negative for ear pain and sore throat.   Eyes: Negative for  pain and visual disturbance.  Respiratory: Negative for cough and shortness of breath.   Cardiovascular: Negative for chest pain and palpitations.  Gastrointestinal: Negative for abdominal pain and vomiting.  Genitourinary: Negative for dysuria and hematuria.  Musculoskeletal: Negative for arthralgias and back pain.  Skin: Negative for color change and rash.  Neurological: Positive for light-headedness. Negative for seizures and syncope.  All other systems reviewed and are negative.   Physical Exam Updated Vital Signs BP 124/82   Pulse 74   Temp 98.1 F (36.7 C) (Oral)   Resp 20   SpO2 100%   Physical Exam Vitals and nursing note reviewed.  Constitutional:      Appearance: He is well-developed.  HENT:     Head: Normocephalic and atraumatic.  Eyes:     Conjunctiva/sclera: Conjunctivae normal.  Cardiovascular:     Rate and Rhythm: Normal rate and regular rhythm.     Heart sounds: No murmur.  Pulmonary:     Effort: Pulmonary effort is normal. No respiratory distress.     Breath sounds: Normal breath sounds.  Abdominal:     Palpations: Abdomen is soft.     Tenderness: There is no abdominal tenderness.  Musculoskeletal:        General: No swelling, tenderness, deformity or signs of injury.     Cervical back: Neck supple.  Skin:    General: Skin is warm and dry.  Neurological:     General: No focal deficit present.     Mental Status: He is alert and oriented to person, place, and time.     ED Results / Procedures / Treatments   Labs (all labs ordered are listed, but only abnormal results are displayed) Labs Reviewed  CBC WITH DIFFERENTIAL/PLATELET - Abnormal; Notable for the following components:      Result Value   RDW 11.4 (*)    All other components within normal limits  BASIC METABOLIC PANEL - Abnormal; Notable for the following components:   Sodium 146 (*)    Glucose, Bld 100 (*)    Creatinine, Ser 1.25 (*)    All other components within normal limits  CBG  MONITORING, ED    EKG None  Radiology No results found.  Procedures Procedures (including critical care time)  Medications Ordered in ED Medications - No data to display  ED Course  I have reviewed the triage vital signs and the nursing notes.  Pertinent labs & imaging results that were available during my care of the patient were reviewed by me and considered in my medical decision making (see chart for details).  Clinical Course as of Jul 01 2236  Wed Jul 02, 2019  8841 Patient requesting discharge, does not want to wait for any additional work-up or his results   [RD]    Clinical Course User Index [RD] Stevie Kern,  Quitman Livings, MD   MDM Rules/Calculators/A&P                      44 year old male presents to ER after reported syncopal episode at jail.  Was reportedly tased today.  Here patient is well-appearing, has normal vital signs, EKG without acute ischemic changes, no arrhythmia on EKG or on telemetry monitoring.  CBC, BMP grossly within normal limits.  Given patient's current presentation, work-up today, believe patient can be safely discharged back to jail.  Reviewed return precautions with staff and patient, discharged.    After the discussed management above, the patient was determined to be safe for discharge.  The patient was in agreement with this plan and all questions regarding their care were answered.  ED return precautions were discussed and the patient will return to the ED with any significant worsening of condition.   Final Clinical Impression(s) / ED Diagnoses Final diagnoses:  Syncope, unspecified syncope type    Rx / DC Orders ED Discharge Orders    None       Milagros Loll, MD 07/02/19 2239

## 2019-07-02 NOTE — ED Notes (Signed)
Pt requesting to leave. MD aware.

## 2019-07-02 NOTE — ED Notes (Signed)
Pt provided with Coke and [place] sandwich

## 2019-07-02 NOTE — Discharge Instructions (Signed)
Return for any chest pain, difficulty in breathing, episodes of passing out or other new concerning symptom.

## 2019-10-02 ENCOUNTER — Other Ambulatory Visit: Payer: Self-pay

## 2019-10-02 ENCOUNTER — Ambulatory Visit (INDEPENDENT_AMBULATORY_CARE_PROVIDER_SITE_OTHER): Admitting: Infectious Disease

## 2019-10-02 ENCOUNTER — Encounter: Payer: Self-pay | Admitting: Infectious Disease

## 2019-10-02 VITALS — BP 134/89 | HR 70 | Temp 98.0°F

## 2019-10-02 DIAGNOSIS — F250 Schizoaffective disorder, bipolar type: Secondary | ICD-10-CM | POA: Diagnosis not present

## 2019-10-02 DIAGNOSIS — F919 Conduct disorder, unspecified: Secondary | ICD-10-CM | POA: Diagnosis not present

## 2019-10-02 DIAGNOSIS — F1410 Cocaine abuse, uncomplicated: Secondary | ICD-10-CM

## 2019-10-02 DIAGNOSIS — Z72811 Adult antisocial behavior: Secondary | ICD-10-CM

## 2019-10-02 DIAGNOSIS — B20 Human immunodeficiency virus [HIV] disease: Secondary | ICD-10-CM | POA: Diagnosis not present
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
## 2019-10-03 LAB — CYTOLOGY, (ORAL, ANAL, URETHRAL) ANCILLARY ONLY
Chlamydia: NEGATIVE
Comment: NEGATIVE
Comment: NORMAL
Neisseria Gonorrhea: NEGATIVE

## 2019-10-03 LAB — URINE CYTOLOGY ANCILLARY ONLY
Chlamydia: NEGATIVE
Comment: NEGATIVE
Comment: NORMAL
Neisseria Gonorrhea: NEGATIVE

## 2019-10-03 LAB — T-HELPER CELL (CD4) - (RCID CLINIC ONLY)
CD4 % Helper T Cell: 28 % — ABNORMAL LOW (ref 33–65)
CD4 T Cell Abs: 573 /uL (ref 400–1790)

## 2019-10-05 LAB — RPR: RPR Ser Ql: NONREACTIVE

## 2019-10-05 LAB — CBC WITH DIFFERENTIAL/PLATELET
Absolute Monocytes: 409 cells/uL (ref 200–950)
Basophils Absolute: 19 cells/uL (ref 0–200)
Basophils Relative: 0.4 %
Eosinophils Absolute: 71 cells/uL (ref 15–500)
Eosinophils Relative: 1.5 %
HCT: 41.9 % (ref 38.5–50.0)
Hemoglobin: 13.9 g/dL (ref 13.2–17.1)
Lymphs Abs: 1354 cells/uL (ref 850–3900)
MCH: 31.4 pg (ref 27.0–33.0)
MCHC: 33.2 g/dL (ref 32.0–36.0)
MCV: 94.8 fL (ref 80.0–100.0)
MPV: 10.9 fL (ref 7.5–12.5)
Monocytes Relative: 8.7 %
Neutro Abs: 2848 cells/uL (ref 1500–7800)
Neutrophils Relative %: 60.6 %
Platelets: 178 10*3/uL (ref 140–400)
RBC: 4.42 10*6/uL (ref 4.20–5.80)
RDW: 12.1 % (ref 11.0–15.0)
Total Lymphocyte: 28.8 %
WBC: 4.7 10*3/uL (ref 3.8–10.8)

## 2019-10-05 LAB — COMPLETE METABOLIC PANEL WITH GFR
AG Ratio: 1.6 (calc) (ref 1.0–2.5)
ALT: 6 U/L — ABNORMAL LOW (ref 9–46)
AST: 10 U/L (ref 10–40)
Albumin: 4.2 g/dL (ref 3.6–5.1)
Alkaline phosphatase (APISO): 82 U/L (ref 36–130)
BUN: 13 mg/dL (ref 7–25)
CO2: 30 mmol/L (ref 20–32)
Calcium: 9.9 mg/dL (ref 8.6–10.3)
Chloride: 108 mmol/L (ref 98–110)
Creat: 1.2 mg/dL (ref 0.60–1.35)
GFR, Est African American: 85 mL/min/{1.73_m2} (ref >=60)
GFR, Est Non African American: 74 mL/min/{1.73_m2} (ref >=60)
Globulin: 2.6 g/dL (calc) (ref 1.9–3.7)
Glucose, Bld: 94 mg/dL (ref 65–99)
Potassium: 4 mmol/L (ref 3.5–5.3)
Sodium: 144 mmol/L (ref 135–146)
Total Bilirubin: 0.7 mg/dL (ref 0.2–1.2)
Total Protein: 6.8 g/dL (ref 6.1–8.1)

## 2019-10-05 LAB — HIV-1 RNA QUANT-NO REFLEX-BLD
HIV 1 RNA Quant: <20 Copies/mL
HIV-1 RNA Quant, Log: <1.3 Log cps/mL

## 2019-10-09 ENCOUNTER — Other Ambulatory Visit: Payer: Self-pay

## 2019-10-09 DIAGNOSIS — B20 Human immunodeficiency virus [HIV] disease: Secondary | ICD-10-CM

## 2019-10-09 MED ORDER — BIKTARVY 50-200-25 MG PO TABS: 1.0000 | ORAL_TABLET | Freq: Every day | ORAL | 6 refills | Status: DC

## 2020-01-12 ENCOUNTER — Telehealth: Payer: Self-pay

## 2020-01-12 NOTE — Telephone Encounter (Addendum)
[facility] Patient Product/process development scientist completed.    The patient is insured through [facility] MEDICAID. When you run a prescription through [facility] it rejects and say patient is Institutionalized NDC not covered.  I was able to get patient a 30 day card from [employer].          We will continue to follow to see if copay assistance is needed.  Clearance Coots, CPhT Specialty Pharmacy Patient [facility] Phone: [phone] Fax:  [phone]

## 2020-01-19 ENCOUNTER — Ambulatory Visit: Admitting: Infectious Disease

## 2020-01-27 ENCOUNTER — Other Ambulatory Visit: Payer: Self-pay

## 2020-01-27 DIAGNOSIS — B20 Human immunodeficiency virus [HIV] disease: Secondary | ICD-10-CM

## 2020-01-27 MED ORDER — BIKTARVY 50-200-25 MG PO TABS: 1.0000 | ORAL_TABLET | Freq: Every day | ORAL | 6 refills | Status: DC

## 2020-01-27 NOTE — Telephone Encounter (Signed)
Patient is now adapt approved rx sent to [facility]. Patient made aware.  [name] [name]

## 2020-02-09 ENCOUNTER — Ambulatory Visit (INDEPENDENT_AMBULATORY_CARE_PROVIDER_SITE_OTHER): Admitting: Infectious Disease

## 2020-02-09 ENCOUNTER — Other Ambulatory Visit: Payer: Self-pay

## 2020-02-09 ENCOUNTER — Encounter: Payer: Self-pay | Admitting: Infectious Disease

## 2020-02-09 VITALS — BP 153/93 | HR 84 | Resp 15 | Ht 70.0 in | Wt 189.0 lb

## 2020-02-09 DIAGNOSIS — F1410 Cocaine abuse, uncomplicated: Secondary | ICD-10-CM | POA: Diagnosis not present

## 2020-02-09 DIAGNOSIS — B20 Human immunodeficiency virus [HIV] disease: Secondary | ICD-10-CM | POA: Diagnosis not present

## 2020-02-09 DIAGNOSIS — F919 Conduct disorder, unspecified: Secondary | ICD-10-CM | POA: Diagnosis not present

## 2020-02-09 DIAGNOSIS — Z113 Encounter for screening for infections with a predominantly sexual mode of transmission: Secondary | ICD-10-CM

## 2020-02-09 DIAGNOSIS — A549 Gonococcal infection, unspecified: Secondary | ICD-10-CM

## 2020-02-09 DIAGNOSIS — F209 Schizophrenia, unspecified: Secondary | ICD-10-CM

## 2020-02-09 DIAGNOSIS — G40909 Epilepsy, unspecified, not intractable, without status epilepticus: Secondary | ICD-10-CM

## 2020-02-09 DIAGNOSIS — F250 Schizoaffective disorder, bipolar type: Secondary | ICD-10-CM

## 2020-02-09 HISTORY — DX: Gonococcal infection, unspecified: A54.9

## 2020-02-09 MED ORDER — BIKTARVY 50-200-25 MG PO TABS: 1.0000 | ORAL_TABLET | Freq: Every day | ORAL | 11 refills | Status: DC
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
## 2020-02-10 LAB — T-HELPER CELL (CD4) - (RCID CLINIC ONLY)
CD4 % Helper T Cell: 36 % (ref 33–65)
CD4 T Cell Abs: 331 /uL — ABNORMAL LOW (ref 400–1790)

## 2020-02-10 LAB — URINE CYTOLOGY ANCILLARY ONLY
Chlamydia: NEGATIVE
Comment: NEGATIVE
Comment: NORMAL
Neisseria Gonorrhea: NEGATIVE

## 2020-02-14 LAB — COMPLETE METABOLIC PANEL WITH GFR
AG Ratio: 1.5 (calc) (ref 1.0–2.5)
ALT: 13 U/L (ref 9–46)
AST: 14 U/L (ref 10–40)
Albumin: 4.6 g/dL (ref 3.6–5.1)
Alkaline phosphatase (APISO): 103 U/L (ref 36–130)
BUN: 9 mg/dL (ref 7–25)
CO2: 27 mmol/L (ref 20–32)
Calcium: 10.2 mg/dL (ref 8.6–10.3)
Chloride: 109 mmol/L (ref 98–110)
Creat: 1.16 mg/dL (ref 0.60–1.35)
GFR, Est African American: 89 mL/min/{1.73_m2} (ref >=60)
GFR, Est Non African American: 77 mL/min/{1.73_m2} (ref >=60)
Globulin: 3 g/dL (calc) (ref 1.9–3.7)
Glucose, Bld: 98 mg/dL (ref 65–99)
Potassium: 4.3 mmol/L (ref 3.5–5.3)
Sodium: 143 mmol/L (ref 135–146)
Total Bilirubin: 0.6 mg/dL (ref 0.2–1.2)
Total Protein: 7.6 g/dL (ref 6.1–8.1)

## 2020-02-14 LAB — CBC WITH DIFFERENTIAL/PLATELET
Absolute Monocytes: 446 cells/uL (ref 200–950)
Basophils Absolute: 31 cells/uL (ref 0–200)
Basophils Relative: 0.5 %
Eosinophils Absolute: 62 cells/uL (ref 15–500)
Eosinophils Relative: 1 %
HCT: 46.4 % (ref 38.5–50.0)
Hemoglobin: 15.8 g/dL (ref 13.2–17.1)
Lymphs Abs: 986 cells/uL (ref 850–3900)
MCH: 33.5 pg — ABNORMAL HIGH (ref 27.0–33.0)
MCHC: 34.1 g/dL (ref 32.0–36.0)
MCV: 98.3 fL (ref 80.0–100.0)
MPV: 11.3 fL (ref 7.5–12.5)
Monocytes Relative: 7.2 %
Neutro Abs: 4675 cells/uL (ref 1500–7800)
Neutrophils Relative %: 75.4 %
Platelets: 182 10*3/uL (ref 140–400)
RBC: 4.72 10*6/uL (ref 4.20–5.80)
RDW: 11.1 % (ref 11.0–15.0)
Total Lymphocyte: 15.9 %
WBC: 6.2 10*3/uL (ref 3.8–10.8)

## 2020-02-14 LAB — RPR: RPR Ser Ql: NONREACTIVE

## 2020-02-14 LAB — HIV-1 RNA QUANT-NO REFLEX-BLD
HIV 1 RNA Quant: <20 Copies/mL
HIV-1 RNA Quant, Log: <1.3 Log cps/mL

## 2020-02-27 ENCOUNTER — Telehealth: Payer: Self-pay

## 2020-02-27 NOTE — Telephone Encounter (Signed)
Patient walked in to clinic to discuss medications. Reports that he was receiving Valtrex while incarcerated and needs more refills. Patient doesn't report an outbreak just itching. Upon inquiry the patient has been using hand sanitizer on his penis to "clean the germs off" and it worked in "treating it" before. Patient emphatically asked the patient not to do that moving forward as it is harsh on the skin and should only be used on hands. Patient reported itching in the area and that he has been taking about 4 showers a day. RN recommended that he decrease the shower amounts as that can dry out the skin as well as stop using the hand sanitizer in that fashion. Patient requested that Valtrex be sent to the same pharmacy as his Biktarvy.   Rashay Barnette Loyola Mast, RN

## 2020-03-01 ENCOUNTER — Other Ambulatory Visit: Payer: Self-pay | Admitting: Infectious Disease

## 2020-03-01 MED ORDER — VALACYCLOVIR HCL 500 MG PO TABS: 500.0000 mg | ORAL_TABLET | Freq: Every day | ORAL | 5 refills | Status: DC

## 2020-03-01 NOTE — Telephone Encounter (Signed)
Thanks Dorie and I sent in valtrex daily prophylaxis

## 2020-03-01 NOTE — Telephone Encounter (Signed)
Called patient, left voicemail letting him know that Dr Daiva Eves sent the requested medication to  [facility] on [address]. Andree Coss, RN

## 2020-04-15 ENCOUNTER — Encounter: Payer: Self-pay | Admitting: Infectious Disease

## 2020-04-15 ENCOUNTER — Other Ambulatory Visit (HOSPITAL_COMMUNITY)
Admission: RE | Admit: 2020-04-15 | Discharge: 2020-04-15 | Disposition: A | Discharge status: Home / Self Care | Payer: Medicaid Other | Source: Ambulatory Visit | Attending: Infectious Disease | Admitting: Infectious Disease

## 2020-04-15 ENCOUNTER — Other Ambulatory Visit: Payer: Self-pay

## 2020-04-15 ENCOUNTER — Ambulatory Visit (INDEPENDENT_AMBULATORY_CARE_PROVIDER_SITE_OTHER): Payer: Medicaid Other | Admitting: Infectious Disease

## 2020-04-15 VITALS — BP 144/93 | HR 95

## 2020-04-15 DIAGNOSIS — F4321 Adjustment disorder with depressed mood: Secondary | ICD-10-CM | POA: Insufficient documentation

## 2020-04-15 DIAGNOSIS — F1910 Other psychoactive substance abuse, uncomplicated: Secondary | ICD-10-CM

## 2020-04-15 DIAGNOSIS — B20 Human immunodeficiency virus [HIV] disease: Secondary | ICD-10-CM | POA: Diagnosis not present

## 2020-04-15 DIAGNOSIS — F1410 Cocaine abuse, uncomplicated: Secondary | ICD-10-CM

## 2020-04-15 DIAGNOSIS — F209 Schizophrenia, unspecified: Secondary | ICD-10-CM | POA: Diagnosis not present

## 2020-04-15 DIAGNOSIS — A549 Gonococcal infection, unspecified: Secondary | ICD-10-CM | POA: Diagnosis not present

## 2020-04-15 MED ORDER — BIKTARVY 50-200-25 MG PO TABS: 1.0000 | ORAL_TABLET | Freq: Every day | ORAL | 11 refills | Status: DC

## 2020-04-15 MED ORDER — LEVETIRACETAM 500 MG PO TABS: 500.0000 mg | ORAL_TABLET | Freq: Two times a day (BID) | ORAL | 11 refills | Status: DC
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
## 2020-04-16 LAB — URINE CYTOLOGY ANCILLARY ONLY
Chlamydia: NEGATIVE
Comment: NEGATIVE
Comment: NORMAL
Neisseria Gonorrhea: NEGATIVE

## 2020-04-16 LAB — T-HELPER CELL (CD4) - (RCID CLINIC ONLY)
CD4 % Helper T Cell: 31 % — ABNORMAL LOW (ref 33–65)
CD4 T Cell Abs: 348 /uL — ABNORMAL LOW (ref 400–1790)

## 2020-04-17 LAB — COMPLETE METABOLIC PANEL WITH GFR
AG Ratio: 1.5 (calc) (ref 1.0–2.5)
ALT: 27 U/L (ref 9–46)
AST: 20 U/L (ref 10–40)
Albumin: 4.2 g/dL (ref 3.6–5.1)
Alkaline phosphatase (APISO): 103 U/L (ref 36–130)
BUN: 11 mg/dL (ref 7–25)
CO2: 32 mmol/L (ref 20–32)
Calcium: 9.8 mg/dL (ref 8.6–10.3)
Chloride: 109 mmol/L (ref 98–110)
Creat: 1.17 mg/dL (ref 0.60–1.35)
GFR, Est African American: 88 mL/min/{1.73_m2} (ref >=60)
GFR, Est Non African American: 76 mL/min/{1.73_m2} (ref >=60)
Globulin: 2.8 g/dL (calc) (ref 1.9–3.7)
Glucose, Bld: 59 mg/dL — ABNORMAL LOW (ref 65–99)
Potassium: 4.1 mmol/L (ref 3.5–5.3)
Sodium: 145 mmol/L (ref 135–146)
Total Bilirubin: 0.6 mg/dL (ref 0.2–1.2)
Total Protein: 7 g/dL (ref 6.1–8.1)

## 2020-04-17 LAB — CBC WITH DIFFERENTIAL/PLATELET
Absolute Monocytes: 614 cells/uL (ref 200–950)
Basophils Absolute: 30 cells/uL (ref 0–200)
Basophils Relative: 0.5 %
Eosinophils Absolute: 83 cells/uL (ref 15–500)
Eosinophils Relative: 1.4 %
HCT: 44.6 % (ref 38.5–50.0)
Hemoglobin: 14.8 g/dL (ref 13.2–17.1)
Lymphs Abs: 1145 cells/uL (ref 850–3900)
MCH: 31.8 pg (ref 27.0–33.0)
MCHC: 33.2 g/dL (ref 32.0–36.0)
MCV: 95.7 fL (ref 80.0–100.0)
MPV: 11 fL (ref 7.5–12.5)
Monocytes Relative: 10.4 %
Neutro Abs: 4030 cells/uL (ref 1500–7800)
Neutrophils Relative %: 68.3 %
Platelets: 184 10*3/uL (ref 140–400)
RBC: 4.66 10*6/uL (ref 4.20–5.80)
RDW: 12.1 % (ref 11.0–15.0)
Total Lymphocyte: 19.4 %
WBC: 5.9 10*3/uL (ref 3.8–10.8)

## 2020-04-17 LAB — HIV-1 RNA QUANT-NO REFLEX-BLD
HIV 1 RNA Quant: <20 Copies/mL
HIV-1 RNA Quant, Log: <1.3 Log cps/mL

## 2020-04-17 LAB — RPR: RPR Ser Ql: NONREACTIVE

## 2020-05-20 ENCOUNTER — Encounter: Payer: Self-pay | Admitting: Infectious Disease

## 2020-05-20 ENCOUNTER — Ambulatory Visit (INDEPENDENT_AMBULATORY_CARE_PROVIDER_SITE_OTHER): Payer: Medicaid Other | Admitting: Infectious Disease

## 2020-05-20 ENCOUNTER — Other Ambulatory Visit: Payer: Self-pay

## 2020-05-20 VITALS — BP 143/90 | HR 94 | Temp 98.2°F | Wt 211.0 lb

## 2020-05-20 DIAGNOSIS — F250 Schizoaffective disorder, bipolar type: Secondary | ICD-10-CM | POA: Diagnosis not present

## 2020-05-20 DIAGNOSIS — B20 Human immunodeficiency virus [HIV] disease: Secondary | ICD-10-CM | POA: Diagnosis not present

## 2020-05-20 DIAGNOSIS — J302 Other seasonal allergic rhinitis: Secondary | ICD-10-CM

## 2020-05-20 DIAGNOSIS — A549 Gonococcal infection, unspecified: Secondary | ICD-10-CM | POA: Diagnosis not present

## 2020-05-20 DIAGNOSIS — R45850 Homicidal ideations: Secondary | ICD-10-CM

## 2020-05-20 DIAGNOSIS — F209 Schizophrenia, unspecified: Secondary | ICD-10-CM

## 2020-05-20 DIAGNOSIS — R0981 Nasal congestion: Secondary | ICD-10-CM
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
## 2020-06-14 ENCOUNTER — Encounter: Payer: Self-pay | Admitting: Infectious Disease

## 2020-06-14 ENCOUNTER — Other Ambulatory Visit (HOSPITAL_COMMUNITY)
Admission: RE | Admit: 2020-06-14 | Discharge: 2020-06-14 | Disposition: A | Discharge status: Home / Self Care | Payer: Medicaid Other | Source: Ambulatory Visit | Attending: Infectious Disease | Admitting: Infectious Disease

## 2020-06-14 ENCOUNTER — Ambulatory Visit (INDEPENDENT_AMBULATORY_CARE_PROVIDER_SITE_OTHER): Payer: Medicaid Other | Admitting: Infectious Disease

## 2020-06-14 ENCOUNTER — Other Ambulatory Visit: Payer: Self-pay

## 2020-06-14 VITALS — BP 132/84 | HR 87 | Wt 210.0 lb

## 2020-06-14 DIAGNOSIS — F250 Schizoaffective disorder, bipolar type: Secondary | ICD-10-CM

## 2020-06-14 DIAGNOSIS — A549 Gonococcal infection, unspecified: Secondary | ICD-10-CM

## 2020-06-14 DIAGNOSIS — B20 Human immunodeficiency virus [HIV] disease: Secondary | ICD-10-CM | POA: Diagnosis present

## 2020-06-14 DIAGNOSIS — F1220 Cannabis dependence, uncomplicated: Secondary | ICD-10-CM | POA: Diagnosis present

## 2020-06-14 DIAGNOSIS — F4321 Adjustment disorder with depressed mood: Secondary | ICD-10-CM | POA: Diagnosis not present

## 2020-06-14 DIAGNOSIS — K5909 Other constipation: Secondary | ICD-10-CM

## 2020-06-14 DIAGNOSIS — Z72811 Adult antisocial behavior: Secondary | ICD-10-CM | POA: Diagnosis not present

## 2020-06-14 DIAGNOSIS — F1910 Other psychoactive substance abuse, uncomplicated: Secondary | ICD-10-CM | POA: Diagnosis not present

## 2020-06-14 HISTORY — DX: Adjustment disorder with depressed mood: F43.21
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
## 2020-06-15 LAB — URINE CYTOLOGY ANCILLARY ONLY
Chlamydia: NEGATIVE
Comment: NEGATIVE
Comment: NORMAL
Neisseria Gonorrhea: NEGATIVE

## 2020-06-15 LAB — T-HELPER CELL (CD4) - (RCID CLINIC ONLY)
CD4 % Helper T Cell: 31 % — ABNORMAL LOW (ref 33–65)
CD4 T Cell Abs: 350 /uL — ABNORMAL LOW (ref 400–1790)

## 2020-06-16 LAB — COMPLETE METABOLIC PANEL WITH GFR
AG Ratio: 1.5 (calc) (ref 1.0–2.5)
ALT: 34 U/L (ref 9–46)
AST: 23 U/L (ref 10–40)
Albumin: 4.4 g/dL (ref 3.6–5.1)
Alkaline phosphatase (APISO): 103 U/L (ref 36–130)
BUN: 13 mg/dL (ref 7–25)
CO2: 26 mmol/L (ref 20–32)
Calcium: 10.2 mg/dL (ref 8.6–10.3)
Chloride: 110 mmol/L (ref 98–110)
Creat: 1.35 mg/dL (ref 0.60–1.35)
GFR, Est African American: 74 mL/min/{1.73_m2} (ref >=60)
GFR, Est Non African American: 64 mL/min/{1.73_m2} (ref >=60)
Globulin: 2.9 g/dL (calc) (ref 1.9–3.7)
Glucose, Bld: 94 mg/dL (ref 65–99)
Potassium: 4.3 mmol/L (ref 3.5–5.3)
Sodium: 142 mmol/L (ref 135–146)
Total Bilirubin: 0.7 mg/dL (ref 0.2–1.2)
Total Protein: 7.3 g/dL (ref 6.1–8.1)

## 2020-06-16 LAB — HIV-1 RNA QUANT-NO REFLEX-BLD
HIV 1 RNA Quant: NOT DETECTED Copies/mL
HIV-1 RNA Quant, Log: NOT DETECTED Log cps/mL

## 2020-06-16 LAB — CBC WITH DIFFERENTIAL/PLATELET
Absolute Monocytes: 559 cells/uL (ref 200–950)
Basophils Absolute: 29 cells/uL (ref 0–200)
Basophils Relative: 0.5 %
Eosinophils Absolute: 97 cells/uL (ref 15–500)
Eosinophils Relative: 1.7 %
HCT: 47.4 % (ref 38.5–50.0)
Hemoglobin: 15.9 g/dL (ref 13.2–17.1)
Lymphs Abs: 1243 cells/uL (ref 850–3900)
MCH: 32.6 pg (ref 27.0–33.0)
MCHC: 33.5 g/dL (ref 32.0–36.0)
MCV: 97.3 fL (ref 80.0–100.0)
MPV: 10.8 fL (ref 7.5–12.5)
Monocytes Relative: 9.8 %
Neutro Abs: 3773 cells/uL (ref 1500–7800)
Neutrophils Relative %: 66.2 %
Platelets: 199 10*3/uL (ref 140–400)
RBC: 4.87 10*6/uL (ref 4.20–5.80)
RDW: 11.8 % (ref 11.0–15.0)
Total Lymphocyte: 21.8 %
WBC: 5.7 10*3/uL (ref 3.8–10.8)

## 2020-06-16 LAB — RPR: RPR Ser Ql: NONREACTIVE

## 2020-06-25 ENCOUNTER — Ambulatory Visit: Payer: Medicaid Other | Admitting: Infectious Disease

## 2020-07-14 ENCOUNTER — Other Ambulatory Visit: Payer: Self-pay

## 2020-07-14 ENCOUNTER — Encounter: Payer: Self-pay | Admitting: Infectious Disease

## 2020-07-14 ENCOUNTER — Ambulatory Visit (INDEPENDENT_AMBULATORY_CARE_PROVIDER_SITE_OTHER): Payer: Medicaid Other | Admitting: Infectious Disease

## 2020-07-14 VITALS — BP 130/84 | HR 98 | Resp 16 | Ht 70.0 in | Wt 206.0 lb

## 2020-07-14 DIAGNOSIS — F4321 Adjustment disorder with depressed mood: Secondary | ICD-10-CM

## 2020-07-14 DIAGNOSIS — F250 Schizoaffective disorder, bipolar type: Secondary | ICD-10-CM | POA: Diagnosis not present

## 2020-07-14 DIAGNOSIS — G4700 Insomnia, unspecified: Secondary | ICD-10-CM | POA: Diagnosis not present

## 2020-07-14 DIAGNOSIS — B20 Human immunodeficiency virus [HIV] disease: Secondary | ICD-10-CM

## 2020-07-14 DIAGNOSIS — A549 Gonococcal infection, unspecified: Secondary | ICD-10-CM

## 2020-07-14 DIAGNOSIS — F919 Conduct disorder, unspecified: Secondary | ICD-10-CM
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
## 2020-08-02 ENCOUNTER — Encounter (HOSPITAL_COMMUNITY): Payer: Self-pay

## 2020-08-02 ENCOUNTER — Ambulatory Visit (HOSPITAL_COMMUNITY)
Admission: EM | Admit: 2020-08-02 | Discharge: 2020-08-02 | Disposition: A | Discharge status: Other Acute Inpatient Hospital | Payer: Medicaid Other | Attending: Urology | Admitting: Urology

## 2020-08-02 ENCOUNTER — Emergency Department (HOSPITAL_COMMUNITY)
Admission: EM | Admit: 2020-08-02 | Discharge: 2020-08-03 | Disposition: A | Discharge status: Home / Self Care | Payer: Medicaid Other | Attending: Emergency Medicine | Admitting: Emergency Medicine

## 2020-08-02 DIAGNOSIS — F1910 Other psychoactive substance abuse, uncomplicated: Secondary | ICD-10-CM | POA: Diagnosis present

## 2020-08-02 DIAGNOSIS — Z79899 Other long term (current) drug therapy: Secondary | ICD-10-CM | POA: Insufficient documentation

## 2020-08-02 DIAGNOSIS — F250 Schizoaffective disorder, bipolar type: Secondary | ICD-10-CM | POA: Diagnosis not present

## 2020-08-02 DIAGNOSIS — Z630 Problems in relationship with spouse or partner: Secondary | ICD-10-CM | POA: Insufficient documentation

## 2020-08-02 DIAGNOSIS — J45909 Unspecified asthma, uncomplicated: Secondary | ICD-10-CM | POA: Insufficient documentation

## 2020-08-02 DIAGNOSIS — Z21 Asymptomatic human immunodeficiency virus [HIV] infection status: Secondary | ICD-10-CM | POA: Insufficient documentation

## 2020-08-02 DIAGNOSIS — R4689 Other symptoms and signs involving appearance and behavior: Secondary | ICD-10-CM

## 2020-08-02 DIAGNOSIS — R456 Violent behavior: Secondary | ICD-10-CM | POA: Insufficient documentation

## 2020-08-02 DIAGNOSIS — Z87891 Personal history of nicotine dependence: Secondary | ICD-10-CM | POA: Insufficient documentation

## 2020-08-02 DIAGNOSIS — Y900 Blood alcohol level of less than 20 mg/100 ml: Secondary | ICD-10-CM | POA: Diagnosis not present

## 2020-08-02 DIAGNOSIS — Z9114 Patient's other noncompliance with medication regimen: Secondary | ICD-10-CM | POA: Insufficient documentation

## 2020-08-02 DIAGNOSIS — R45850 Homicidal ideations: Secondary | ICD-10-CM | POA: Insufficient documentation

## 2020-08-02 DIAGNOSIS — I10 Essential (primary) hypertension: Secondary | ICD-10-CM | POA: Diagnosis not present

## 2020-08-02 DIAGNOSIS — Z20822 Contact with and (suspected) exposure to covid-19: Secondary | ICD-10-CM | POA: Diagnosis not present

## 2020-08-02 LAB — CBC
HCT: 45.6 % (ref 39.0–52.0)
Hemoglobin: 15.2 g/dL (ref 13.0–17.0)
MCH: 32.7 pg (ref 26.0–34.0)
MCHC: 33.3 g/dL (ref 30.0–36.0)
MCV: 98.1 fL (ref 80.0–100.0)
Platelets: 190 10*3/uL (ref 150–400)
RBC: 4.65 MIL/uL (ref 4.22–5.81)
RDW: 11.9 % (ref 11.5–15.5)
WBC: 7.1 10*3/uL (ref 4.0–10.5)
nRBC: 0 % (ref 0.0–0.2)

## 2020-08-02 LAB — COMPREHENSIVE METABOLIC PANEL
ALT: 18 U/L (ref 0–44)
AST: 21 U/L (ref 15–41)
Albumin: 4.4 g/dL (ref 3.5–5.0)
Alkaline Phosphatase: 87 U/L (ref 38–126)
Anion gap: 7 (ref 5–15)
BUN: 9 mg/dL (ref 6–20)
CO2: 25 mmol/L (ref 22–32)
Calcium: 9.8 mg/dL (ref 8.9–10.3)
Chloride: 108 mmol/L (ref 98–111)
Creatinine, Ser: 1.22 mg/dL (ref 0.61–1.24)
GFR, Estimated: >60 mL/min (ref >60)
Glucose, Bld: 100 mg/dL — ABNORMAL HIGH (ref 70–99)
Potassium: 3.5 mmol/L (ref 3.5–5.1)
Sodium: 140 mmol/L (ref 135–145)
Total Bilirubin: 1.9 mg/dL — ABNORMAL HIGH (ref 0.3–1.2)
Total Protein: 7.6 g/dL (ref 6.5–8.1)

## 2020-08-02 LAB — RAPID URINE DRUG SCREEN, HOSP PERFORMED
Amphetamines: NOT DETECTED
Barbiturates: NOT DETECTED
Benzodiazepines: NOT DETECTED
Cocaine: POSITIVE — AB
Opiates: NOT DETECTED
Tetrahydrocannabinol: NOT DETECTED

## 2020-08-02 LAB — RESP PANEL BY RT-PCR (FLU A&B, COVID) ARPGX2
Influenza A by PCR: NEGATIVE
Influenza B by PCR: NEGATIVE
SARS Coronavirus 2 by RT PCR: NEGATIVE

## 2020-08-02 LAB — SALICYLATE LEVEL: Salicylate Lvl: <7 mg/dL — ABNORMAL LOW (ref 7.0–30.0)

## 2020-08-02 LAB — LITHIUM LEVEL: Lithium Lvl: 0.59 mmol/L — ABNORMAL LOW (ref 0.60–1.20)

## 2020-08-02 LAB — ETHANOL: Alcohol, Ethyl (B): <10 mg/dL (ref <10)

## 2020-08-02 LAB — ACETAMINOPHEN LEVEL: Acetaminophen (Tylenol), Serum: <10 ug/mL — ABNORMAL LOW (ref 10–30)

## 2020-08-02 MED ORDER — LORAZEPAM 1 MG PO TABS: 1.0000 mg | ORAL_TABLET | ORAL | Status: DC | PRN

## 2020-08-02 MED ORDER — LEVETIRACETAM 500 MG PO TABS
500.0000 mg | ORAL_TABLET | Freq: Two times a day (BID) | ORAL | Status: DC
  Administered 2020-08-02 – 2020-08-03 (×3): 500 mg via ORAL
  Filled 2020-08-02 (×3): qty 1

## 2020-08-02 MED ORDER — BENZTROPINE MESYLATE 1 MG PO TABS
2.0000 mg | ORAL_TABLET | Freq: Every day | ORAL | Status: DC
  Administered 2020-08-02: 2 mg via ORAL
  Filled 2020-08-02: qty 2

## 2020-08-02 MED ORDER — ACETAMINOPHEN 325 MG PO TABS: 650.0000 mg | ORAL_TABLET | ORAL | Status: DC | PRN

## 2020-08-02 MED ORDER — RISPERIDONE 1 MG PO TBDP: 2.0000 mg | ORAL_TABLET | Freq: Three times a day (TID) | ORAL | Status: DC | PRN

## 2020-08-02 MED ORDER — BICTEGRAVIR-EMTRICITAB-TENOFOV 50-200-25 MG PO TABS
1.0000 | ORAL_TABLET | Freq: Every day | ORAL | Status: DC
  Administered 2020-08-02 – 2020-08-03 (×2): 1 via ORAL
  Filled 2020-08-02 (×2): qty 1

## 2020-08-02 MED ORDER — CHLORPROMAZINE HCL 25 MG PO TABS
100.0000 mg | ORAL_TABLET | Freq: Every day | ORAL | Status: DC
  Administered 2020-08-02 – 2020-08-03 (×2): 100 mg via ORAL
  Filled 2020-08-02 (×2): qty 4

## 2020-08-02 MED ORDER — LITHIUM CARBONATE 300 MG PO CAPS
300.0000 mg | ORAL_CAPSULE | Freq: Three times a day (TID) | ORAL | Status: DC
  Administered 2020-08-02 – 2020-08-03 (×5): 300 mg via ORAL
  Filled 2020-08-02 (×4): qty 1

## 2020-08-02 MED ORDER — LITHIUM CARBONATE 300 MG PO TABS: 300.0000 mg | ORAL_TABLET | Freq: Three times a day (TID) | ORAL | Status: DC

## 2020-08-02 MED ORDER — ALUM & MAG HYDROXIDE-SIMETH 200-200-20 MG/5ML PO SUSP: 30.0000 mL | Freq: Four times a day (QID) | ORAL | Status: DC | PRN

## 2020-08-02 MED ORDER — ONDANSETRON HCL 4 MG PO TABS: 4.0000 mg | ORAL_TABLET | Freq: Three times a day (TID) | ORAL | Status: DC | PRN

## 2020-08-02 MED ORDER — ZOLPIDEM TARTRATE 5 MG PO TABS: 5.0000 mg | ORAL_TABLET | Freq: Every evening | ORAL | Status: DC | PRN

## 2020-08-02 MED ORDER — NICOTINE 21 MG/24HR TD PT24
21.0000 mg | MEDICATED_PATCH | Freq: Every day | TRANSDERMAL | Status: DC
  Administered 2020-08-02: 21 mg via TRANSDERMAL
  Filled 2020-08-02 (×2): qty 1

## 2020-08-02 MED ORDER — ZIPRASIDONE MESYLATE 20 MG IM SOLR: 20.0000 mg | INTRAMUSCULAR | Status: DC | PRN

## 2020-08-02 MED ORDER — AMLODIPINE BESYLATE 5 MG PO TABS
5.0000 mg | ORAL_TABLET | Freq: Every day | ORAL | Status: DC
  Administered 2020-08-02 – 2020-08-03 (×2): 5 mg via ORAL
  Filled 2020-08-02 (×2): qty 1
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
## 2020-08-02 NOTE — ED Notes (Signed)
Patient was given his dinner tray.

## 2020-08-02 NOTE — Progress Notes (Signed)
   08/02/20 0305  [facility] Triage Screening (Walk-ins at [facility] only)  How Did You Hear About [place]?  (Brought in by [employer] under IVC for HI)  What Is the Reason for Your Visit/Call Today? Patient was brought to the [facility] after his girlfriend petitioned for IVC due to remarks he made following an arguement they had earlier today. Per the patient he admitted stating that he said he was going to "burn down the house" and "kill all the people because of my brother" explaingin he thinking of killing these people and "kill them, taking them to the woods, and pouring cement on them becuase then you can't smell them." Patient stated he was just talking about it to let off emotions he was feeling and stated "I'm not gonna go through with it. I wouldn't."  How Long Has This Been Causing You Problems? <Week  Have You Recently Had Any Thoughts About Hurting Yourself? No  Are You Planning to Commit Suicide/Harm Yourself At This time? No  Have you Recently Had Thoughts About Hurting Someone Karolee Ohs? Yes  How long ago did you have thoughts of harming others? this afternnon and evening per IVC  Are You Planning To Harm Someone At This Time? No (denies)  Are you currently experiencing any auditory, visual or other hallucinations? No (denies and does not seem to be responding to internal stimuli)  Have You Used Any Alcohol or Drugs in the Past 24 Hours? Yes  How long ago did you use Drugs or Alcohol? about 24 hours ago  What Did You Use and How Much? about 1 gm of cocaine  Do you have any current medical co-morbidities that require immediate attention? No (per NP)  Clinician description of patient physical appearance/behavior: Disheveled, casually dressed  What Do You Feel Would Help You the Most Today?  (Patient stated he wants to sleep.)  If access to [facility] was not available, would you have sought care in the Emergency Department?  (NA)  Determination of Need Urgent (48 hours)  Options For Referral Medication  Management;ED Visit (Per Ene Ajibola, recommend continuous observation overnight with re-assessment in the morning by psychiatry.)
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
## 2020-08-02 NOTE — Discharge Instructions (Addendum)
Transferred to [facility] 

## 2020-08-02 NOTE — Progress Notes (Signed)
08/02/2020  1840  Patient has been cooperating with all request. He has been taking his medicines, allowing [place] to do vital signs, changed into scrubs, gave up all belongings with no outburst. Belongings are in two bags at the nurses's station cabinet. Patient has eaten all meals and rested as much as possible.

## 2020-08-02 NOTE — ED Notes (Signed)
Patient was given his lunch tray.

## 2020-08-03 DIAGNOSIS — F250 Schizoaffective disorder, bipolar type: Secondary | ICD-10-CM

## 2020-08-03 NOTE — BH Assessment (Signed)
[facility] Assessment Progress Note   Per Caryn Bee, NP, this pt does not require psychiatric hospitalization at this time.  Pt presents under IVC initiated by pt's girlfriend and upheld by EDP Lorre Nick, MD, which has been rescinded by Nelly Rout, MD.  Pt is psychiatrically cleared.  Reviewing pt's record, this writer finds that pt has a history of receiving outpatient treatment through [facility], but it is uncertain if this has continued through the present.  Discharge instructions advise pt to follow up with [facility], offering [facility] as an alternative.  EDP Mancel Bale, MD and pt's nurse, Florentina Addison, have been notified.  Doylene Canning, MA Triage Specialist [phone]

## 2020-08-03 NOTE — ED Provider Notes (Addendum)
Emergency Medicine Observation Re-evaluation Note  [name] [name] is a 45 y.o. male, seen on rounds today.  Pt initially presented to the ED for complaints of No chief complaint on file. Currently, the patient is sitting in the hallway, talking to the sitter.Marland Kitchen  Physical Exam  BP (!) 133/93 (BP Location: Left Arm)   Pulse 74   Temp 97.7 F (36.5 C) (Oral)   Resp 14   Ht 5\' 10"  (1.778 m)   Wt 94 kg   SpO2 99%   BMI 29.73 kg/m  Physical Exam General: Happy Cardiac: Normal heart rate Lungs: Normal respiratory Psych: Not responding to internal stimuli  ED Course / MDM  EKG:   I have reviewed the labs performed to date as well as medications administered while in observation.  Recent changes in the last 24 hours include he has been calm and cooperative.  Plan  Current plan is for psychiatric hospitalization. Patient is under full IVC at this time.   The patient has been placed in psychiatric observation due to the need to provide a safe environment for the patient while obtaining psychiatric consultation and evaluation, as well as ongoing medical and medication management to treat the patient's condition.  The patient has been placed under full IVC at this time.    , MD 08/03/20 [phone]   Patient has been cleared by TTS for discharge.  He is IVC has been rescinded by Dr. [id number].  They have interim follow-up instructions into the AVS.   Lucianne Muss, MD 08/03/20 1133

## 2020-08-03 NOTE — Consult Note (Signed)
[facility] ED Discharge  08/03/2020 9:42 AM [name] [name]  MRN:  [id number] Principal Problem: <principal problem not specified> Discharge Diagnoses: Active Problems:   Schizoaffective disorder, bipolar type (HCC)   Homicidal ideation   Polysubstance abuse (HCC)   Subjective: On assessment, the patient continues to gain insight into his drug use and the effect it has on his mood. His mood is improved and he has a brighter affect today. Tolerating medications well and denies SI/HI/AVH. He reports his hallucinations have decreased and rates them " at the bottom on a scale of 0-10 with 10 being the worst." Sleep and appetite have been good for 24hrs. The patient plans to discharge home.He feels safe with leaving today.  HPI: Patient was brought to the [facility] after his girlfriend petitioned for IVC due to remarks he made following an arguement they had earlier today. Per the patient he admitted stating that he said he was going to "burn down the house" and "kill all the people because of my brother" explaingin he thinking of killing these people and "kill them, taking them to the woods, and pouring cement on them becuase then you can't smell them." Patient stated he was just talking about it to let off emotions he was feeling and stated "I'm not gonna go through with it. I wouldn't."  Total Time spent with patient: 30 minutes  Past Psychiatric History: Schizophrenia and Polysubstance abuse  Past Medical History:  Past Medical History:  Diagnosis Date   Aggression    Alcohol abuse    Asthma    Gonorrhea 02/09/2020   Grief 06/14/2020   HIV disease (HCC) 03/11/2019   Homeless    Hypertension    Penile discharge 05/19/2019   Schizophrenia [facility])     Past Surgical History:  Procedure Laterality Date   KNEE SURGERY     MOUTH SURGERY     Family History: History reviewed. No pertinent family history. Family Psychiatric  History: UNknown  Social History:  Social History   Substance and  Sexual Activity  Alcohol Use Not Currently     Social History   Substance and Sexual Activity  Drug Use Not Currently   Types: Cocaine, Marijuana, Hydrocodone, MDMA (Ecstacy)   Comment: former; patient states he has been clean since 01/25/20    Social History   Socioeconomic History   Marital status: Married    Spouse name: Not on file   Number of children: Not on file   Years of education: Not on file   Highest education level: Not on file  Occupational History   Not on file  Tobacco Use   Smoking status: Former    Pack years: 0.00   Smokeless tobacco: Never  Substance and Sexual Activity   Alcohol use: Not Currently   Drug use: Not Currently    Types: Cocaine, Marijuana, Hydrocodone, MDMA (Ecstacy)    Comment: former; patient states he has been clean since 01/25/20   Sexual activity: Yes    Partners: Female    Birth control/protection: Condom    Comment: "just give me 2 condoms"  Other Topics Concern   Not on file  Social History Narrative   Not on file   Social Determinants of Health   Financial Resource Strain: Not on file  Food Insecurity: Not on file  Transportation Needs: Not on file  Physical Activity: Not on file  Stress: Not on file  Social Connections: Not on file    Tobacco Cessation:  N/A, patient does not currently use  tobacco products  Current Medications: Current Facility-Administered Medications  Medication Dose Route Frequency Provider Last Rate Last Admin   acetaminophen (TYLENOL) tablet 650 mg  650 mg Oral Q4H PRN Fayrene Helper, PA-C       alum & mag hydroxide-simeth (MAALOX/MYLANTA) 200-200-20 MG/5ML suspension 30 mL  30 mL Oral Q6H PRN Fayrene Helper, PA-C       amLODipine (NORVASC) tablet 5 mg  5 mg Oral Daily Fayrene Helper, PA-C   5 mg at 08/03/20 0914   benztropine (COGENTIN) tablet 2 mg  2 mg Oral QHS Fayrene Helper, PA-C   2 mg at 08/02/20 2130   bictegravir-emtricitabine-tenofovir AF (BIKTARVY) 50-200-25 MG per tablet 1 tablet  1 tablet Oral  Daily Fayrene Helper, PA-C   1 tablet at 08/02/20 0957   chlorproMAZINE (THORAZINE) tablet 100 mg  100 mg Oral Daily Fayrene Helper, PA-C   100 mg at 08/02/20 0956   levETIRAcetam (KEPPRA) tablet 500 mg  500 mg Oral BID Fayrene Helper, PA-C   500 mg at 08/03/20 3976   lithium carbonate capsule 300 mg  300 mg Oral TID WC Lorre Nick, MD   300 mg at 08/03/20 0914   risperiDONE (RISPERDAL M-TABS) disintegrating tablet 2 mg  2 mg Oral Q8H PRN Fayrene Helper, PA-C       And   LORazepam (ATIVAN) tablet 1 mg  1 mg Oral PRN Fayrene Helper, PA-C       And   ziprasidone (GEODON) injection 20 mg  20 mg Intramuscular PRN Fayrene Helper, PA-C       nicotine (NICODERM CQ - dosed in mg/24 hours) patch 21 mg  21 mg Transdermal Daily Fayrene Helper, PA-C   21 mg at 08/02/20 0942   ondansetron (ZOFRAN) tablet 4 mg  4 mg Oral Q8H PRN Fayrene Helper, PA-C       zolpidem (AMBIEN) tablet 5 mg  5 mg Oral QHS PRN Fayrene Helper, PA-C       Current Outpatient Medications  Medication Sig Dispense Refill   benztropine (COGENTIN) 2 MG tablet Take 2 mg by mouth at bedtime.     bictegravir-emtricitabine-tenofovir AF (BIKTARVY) 50-200-25 MG TABS tablet Take 1 tablet by mouth daily. 30 tablet 11   cetirizine (ZYRTEC) 10 MG tablet Take 10 mg by mouth daily.     chlorproMAZINE (THORAZINE) 200 MG tablet Take 400 mg by mouth at bedtime.     haloperidol (HALDOL) 20 MG tablet Take 20 mg by mouth at bedtime.     lithium 300 MG tablet Take 1 tablet (300 mg total) by mouth 3 (three) times daily. (Patient taking differently: Take 900 mg by mouth at bedtime.) 30 tablet 0   valACYclovir (VALTREX) 500 MG tablet Take 1 tablet (500 mg total) by mouth daily. 30 tablet 5   chlorproMAZINE (THORAZINE) 100 MG tablet Take 1 tablet (100 mg total) by mouth every morning AND 3 tablets (300 mg total) every evening. (Patient not taking: Reported on 08/02/2020) 38 tablet 0   levETIRAcetam (KEPPRA) 500 MG tablet Take 1 tablet (500 mg total) by mouth 2 (two) times daily.  (Patient not taking: Reported on 08/02/2020) 60 tablet 11   PTA Medications: (Not in a hospital admission)   Musculoskeletal: Strength & Muscle Tone: within normal limits Gait & Station: normal Patient leans: N/A  Psychiatric Specialty Exam:  Presentation  General Appearance: Appropriate for Environment; Disheveled  Eye Contact:Good  Speech:Clear and Coherent; Normal Rate  Speech Volume:Normal  Handedness:Right   Mood and Affect  Mood:Euthymic  Affect:Congruent   Thought Process  Thought Processes:Coherent; Linear  Descriptions of Associations:Circumstantial  Orientation:Full (Time, Place and Person)  Thought Content:Logical; Tangential  History of Schizophrenia/Schizoaffective disorder:Yes  Duration of Psychotic Symptoms:No data recorded Hallucinations:Hallucinations: Auditory ("better, they gone now.") Description of Auditory Hallucinations: decnies today  Ideas of Reference:None  Suicidal Thoughts:Suicidal Thoughts: No  Homicidal Thoughts:Homicidal Thoughts: No HI Passive Intent and/or Plan: Without Intent; Without Means to Carry Out; Without Access to Means   Sensorium  Memory:Recent Fair; Immediate Good; Remote Fair  Judgment:Fair  Insight:Fair   Executive Functions  Concentration:Fair  Attention Span:Fair  Recall:Fair  Fund of Knowledge:Fair  Language:Fair   Psychomotor Activity  Psychomotor Activity:Psychomotor Activity: Normal   Assets  Assets:Communication Skills; Social Support; Desire for Improvement; Leisure Time; Physical Health   Sleep  Sleep:Sleep: Fair    Physical Exam: Physical Exam ROS Blood pressure (!) 133/93, pulse 74, temperature 97.7 F (36.5 C), temperature source Oral, resp. rate 14, height 5\' 10"  (1.778 m), weight 94 kg, SpO2 99 %. Body mass index is 29.73 kg/m.   Demographic Factors:  Male, Low socioeconomic status, and Unemployed  Loss Factors: Financial problems/change in socioeconomic  status  Historical Factors: Family history of mental illness or substance abuse and Impulsivity  Risk Reduction Factors:   Sense of responsibility to family, Living with another person, especially a relative, Positive social support, Positive therapeutic relationship, and Positive coping skills or problem solving skills  Continued Clinical Symptoms:  Severe Anxiety and/or Agitation Alcohol/Substance Abuse/Dependencies More than one psychiatric diagnosis Previous Psychiatric Diagnoses and Treatments  Cognitive Features That Contribute To Risk:  None    Suicide Risk:  Minimal: No identifiable suicidal ideation.  Patients presenting with no risk factors but with morbid ruminations; may be classified as minimal risk based on the severity of the depressive symptoms    Plan Of Care/Follow-up recommendations:  Other:  COntinue taking medications. Keep schedule appointments with psychiatrist for medication management. Maybe seen at the [facility].   Will need to rescind IVC. Please refrain from use of illicit substances.   Disposition: Patient psych cleared to discharge home.  [facility], FNP 08/03/2020, 9:42 AM

## 2020-08-03 NOTE — ED Notes (Signed)
Gave pt discharge paperwork. Pt being escorted with 3 [employer] officers from [facility] ED to his residence.

## 2020-08-03 NOTE — ED Notes (Signed)
[employer] is not present at this time. [facility] non-emergent line and requested [employer] accompany this pt during his admission.

## 2020-08-03 NOTE — Discharge Instructions (Addendum)
For your mental health needs, you are advised to continue treatment with [facility] if they are your current treatment provider:       [facility]      [address]      [place], [place] [address]      [phone]  If for any reason you are unable to follow up with [facility], contact [facility]:       [facility]      [address]      [place], [place] [address]      [phone]      They offer psychiatry/medication management, therapy and substance abuse treatment.  New patients are being seen in their walk-in clinic.  Walk-in hours are Monday - Thursday from 8:00 am - 11:00 am for psychiatry, and Friday from 1:00 pm - 4:00 pm for therapy.  Walk-in patients are seen on a first come, first served basis, so try to arrive as early as possible for the best chance of being seen the same day.

## 2020-08-13 ENCOUNTER — Other Ambulatory Visit: Payer: Self-pay

## 2020-08-13 ENCOUNTER — Emergency Department (HOSPITAL_COMMUNITY)
Admission: EM | Admit: 2020-08-13 | Discharge: 2020-08-13 | Disposition: A | Discharge status: Home / Self Care | Payer: Medicaid Other | Attending: Emergency Medicine | Admitting: Emergency Medicine

## 2020-08-13 ENCOUNTER — Encounter (HOSPITAL_COMMUNITY): Payer: Self-pay

## 2020-08-13 DIAGNOSIS — J45909 Unspecified asthma, uncomplicated: Secondary | ICD-10-CM | POA: Insufficient documentation

## 2020-08-13 DIAGNOSIS — Z87891 Personal history of nicotine dependence: Secondary | ICD-10-CM | POA: Diagnosis not present

## 2020-08-13 DIAGNOSIS — I10 Essential (primary) hypertension: Secondary | ICD-10-CM | POA: Insufficient documentation

## 2020-08-13 DIAGNOSIS — R21 Rash and other nonspecific skin eruption: Secondary | ICD-10-CM

## 2020-08-13 DIAGNOSIS — Z21 Asymptomatic human immunodeficiency virus [HIV] infection status: Secondary | ICD-10-CM | POA: Insufficient documentation

## 2020-08-13 DIAGNOSIS — L247 Irritant contact dermatitis due to plants, except food: Secondary | ICD-10-CM | POA: Diagnosis not present

## 2020-08-13 MED ORDER — HYDROXYZINE HCL 25 MG PO TABS: 25.0000 mg | ORAL_TABLET | Freq: Four times a day (QID) | ORAL | 0 refills | Status: DC | PRN

## 2020-08-13 MED ORDER — DEXAMETHASONE SODIUM PHOSPHATE 10 MG/ML IJ SOLN
10.0000 mg | Freq: Once | INTRAMUSCULAR | Status: AC
  Administered 2020-08-13: 10 mg via INTRAMUSCULAR
  Filled 2020-08-13: qty 1

## 2020-08-13 MED ORDER — PREDNISONE 10 MG PO TABS: ORAL_TABLET | ORAL | 0 refills | Status: AC

## 2020-08-13 NOTE — ED Triage Notes (Signed)
Pt reports poison oak to bilateral arms, genitals, and right eye.

## 2020-08-13 NOTE — ED Provider Notes (Signed)
[facility]-EMERGENCY DEPT Provider Note   CSN: [id number] Arrival date & time: 08/13/20  2115     History Chief Complaint  Patient presents with   Rash    [name] [name] is a 45 y.o. male.   Rash Location:  Full body Quality: itchiness and redness   Onset quality:  Gradual Duration:  4 days Timing:  Constant Progression:  Spreading Chronicity:  New Context: plant contact   Relieved by:  Antihistamines (Benadryl) Worsened by:  Nothing Ineffective treatments:  Antihistamines Associated symptoms: no abdominal pain, no diarrhea, no fever, no nausea, no shortness of breath, no sore throat and not vomiting       [name] [name] is a 45 y.o. male, with a history of alcohol abuse, asthma, HIV, HTN, schizophrenia, presenting to the ED with pruritic rash beginning several days ago.  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 He has tried a dose of Benadryl with some itching relief.  Denies fever/chills, difficulty swallowing, difficulty breathing, chest pain, vomiting/diarrhea, facial lesions, or any other complaints.  Past Medical History:  Diagnosis Date   Aggression    Alcohol abuse    Asthma    Gonorrhea 02/09/2020   Grief 06/14/2020   HIV disease (HCC) 03/11/2019   Homeless    Hypertension    Penile discharge 05/19/2019   Schizophrenia [facility])     Patient Active Problem List   Diagnosis Date Noted   Grief 06/14/2020   Gonorrhea 02/09/2020   Penile discharge 05/19/2019   HIV disease (HCC) 03/11/2019   Cocaine abuse (HCC) 10/04/2015   Suicidal ideation 09/21/2015   Cannabis use disorder, severe, dependence (HCC) 06/18/2015   Behavior disturbance    Schizoaffective disorder, bipolar type (HCC) 04/28/2015   Adult antisocial behavior  04/04/2015   Polysubstance abuse (HCC) 04/04/2015   Homicidal ideation 10/16/2014   Obstructive sleep apnea 10/16/2014    Past Surgical History:  Procedure Laterality Date   KNEE SURGERY     MOUTH SURGERY         History reviewed. No pertinent family history.  Social History   Tobacco Use   Smoking status: Former    Pack years: 0.00   Smokeless tobacco: Never  Substance Use Topics   Alcohol use: Not Currently   Drug use: Not Currently    Types: Cocaine, Marijuana, Hydrocodone, MDMA (Ecstacy)    Comment: former; patient states he has been clean since 01/25/20    Home Medications Prior to Admission medications   Medication Sig Start Date End Date Taking? Authorizing Provider  hydrOXYzine (ATARAX/VISTARIL) 25 MG tablet Take 1 tablet (25 mg total) by mouth every 6 (six) hours as needed for itching. 08/13/20  Yes Fynn Vanblarcom C, PA-C  predniSONE (DELTASONE) 10 MG tablet Take 1 tablet (10 mg total) by mouth 4 (four) times daily - after meals and at bedtime for 5 days, THEN 1 tablet (10 mg total) 3 (three) times daily with meals for 2 days, THEN 1 tablet (10 mg total) 2 (two) times daily with a meal for 2 days, THEN 1 tablet (10 mg total) daily with breakfast for 2 days, THEN 0.5 tablets (5 mg total) daily with breakfast for 4 days. 08/13/20 08/28/20 Yes Tylea Hise C, PA-C  benztropine (COGENTIN) 2 MG tablet Take 2  mg by mouth at bedtime. 04/26/20   [provider]  bictegravir-emtricitabine-tenofovir AF (BIKTARVY) 50-200-25 MG TABS tablet Take 1 tablet by mouth daily. 04/15/20   Randall Hiss, MD  cetirizine (ZYRTEC) 10 MG tablet Take 10 mg by mouth daily. 07/05/20   [provider]  chlorproMAZINE (THORAZINE) 100 MG tablet Take 1 tablet (100 mg total) by mouth every morning AND 3 tablets (300 mg total) every evening. Patient not taking: Reported on 08/02/2020 05/19/17   Samuel Jester, DO  chlorproMAZINE (THORAZINE) 200 MG tablet Take 400 mg by mouth at bedtime.  07/29/20   [provider]  haloperidol (HALDOL) 20 MG tablet Take 20 mg by mouth at bedtime. 04/26/20   [provider]  levETIRAcetam (KEPPRA) 500 MG tablet Take 1 tablet (500 mg total) by mouth 2 (two) times daily. Patient not taking: Reported on 08/02/2020 04/15/20   Daiva Eves, Lisette Grinder, MD  lithium 300 MG tablet Take 1 tablet (300 mg total) by mouth 3 (three) times daily. Patient taking differently: Take 900 mg by mouth at bedtime. 05/19/17   Samuel Jester, DO  valACYclovir (VALTREX) 500 MG tablet Take 1 tablet (500 mg total) by mouth daily. 03/01/20   Randall Hiss, MD    Allergies    Prunus persica, Pseudoephedrine hcl, Sudafed [pseudoephedrine], and Food  Review of Systems   Review of Systems  Constitutional:  Negative for chills, diaphoresis and fever.  HENT:  Negative for sore throat, trouble swallowing and voice change.   Respiratory:  Negative for cough and shortness of breath.   Cardiovascular:  Negative for chest pain and leg swelling.  Gastrointestinal:  Negative for abdominal pain, diarrhea, nausea and vomiting.  Musculoskeletal:  Negative for back pain, neck pain and neck stiffness.  Skin:  Positive for rash.  Neurological:  Negative for weakness and numbness.  All other systems reviewed and are negative.  Physical Exam Updated Vital Signs BP (!) 176/109 (BP Location: Left Arm)   Pulse (!) 103   Temp 98 F (36.7 C) (Oral)   Resp 18   SpO2 98%   Physical Exam Vitals and nursing note reviewed.  Constitutional:      General: He is not in acute distress.    Appearance: He is well-developed. He is not diaphoretic.  HENT:     Head: Normocephalic and atraumatic.     Comments: No noted facial lesions.    Right Ear: Tympanic membrane, ear canal and external ear normal.     Left Ear: Tympanic membrane, ear canal and external ear normal.     Ears:     Comments: No noted lesions within the ear canals.    Mouth/Throat:     Mouth: Mucous  membranes are moist.     Pharynx: Oropharynx is clear.     Comments: No intraoral lesions or swelling.  Tolerates oral secretions without difficulty.  Speaks in full sentences without noted difficulty. Eyes:     Conjunctiva/sclera: Conjunctivae normal.  Cardiovascular:     Rate and Rhythm: Normal rate and regular rhythm.     Pulses: Normal pulses.          Radial pulses are 2+ on the right side and 2+ on the left side.       Posterior tibial pulses are 2+ on the right side and 2+ on the left side.     Heart sounds: Normal heart sounds.  Pulmonary:     Effort: Pulmonary effort is normal. No respiratory distress.  Breath sounds: Normal breath sounds.  Abdominal:     Palpations: Abdomen is soft.     Tenderness: There is no abdominal tenderness. There is no guarding.  Genitourinary:    Comments: Similar, erythematous lesions to the penis, scrotum, and inguinal regions. Musculoskeletal:     Cervical back: Neck supple.     Right lower leg: No edema.     Left lower leg: No edema.  Lymphadenopathy:     Cervical: No cervical adenopathy.  Skin:    General: Skin is warm and dry.     Comments: Erythematous raised lesions to the extremities and trunk. No noted pustules.  Neurological:     Mental Status: He is alert.  Psychiatric:        Mood and Affect: Mood and affect normal.        Speech: Speech normal.        Behavior: Behavior normal.    ED Results / Procedures / Treatments   Labs (all labs ordered are listed, but only abnormal results are displayed) Labs Reviewed - No data to display  EKG None  Radiology No results found.  Procedures Procedures   Medications Ordered in ED Medications  dexamethasone (DECADRON) injection 10 mg (10 mg Intramuscular Given 08/13/20 2152)    ED Course  I have reviewed the triage vital signs and the nursing notes.  Pertinent labs & imaging results that were available during my care of the patient were reviewed by me and considered in my  medical decision making (see chart for details).    MDM Rules/Calculators/A&P                          Patient presents with a pruritic rash over the last several days.  He is afebrile, nontoxic-appearing, not tachycardic on my exam.  He does not have red flag symptoms or emergent rash findings. This rash does present in both nature and physical exam presentation as a contact dermatitis, such as Rhus dermatitis. The patient was given instructions for home care as well as return precautions. Patient voices understanding of these instructions, accepts the plan, and is comfortable with discharge.   Final Clinical Impression(s) / ED Diagnoses Final diagnoses:  Rash  Irritant contact dermatitis due to plants, except food    Rx / DC Orders ED Discharge Orders          Ordered    predniSONE (DELTASONE) 10 MG tablet        08/13/20 2140    hydrOXYzine (ATARAX/VISTARIL) 25 MG tablet  Every 6 hours PRN        08/13/20 2141             Anselm Pancoast, PA-C 08/15/20 1836    Arby Barrette, MD 08/19/20 [phone]

## 2020-08-13 NOTE — Discharge Instructions (Addendum)
  Take the prednisone, as prescribed, until finished.  Hydroxyzine: May use the hydroxyzine, as needed, for itching.  Use caution as hydroxyzine can make you drowsy.  Follow-up with a primary care provider or dermatologist should symptoms fail to resolve.

## 2020-08-15 ENCOUNTER — Emergency Department (HOSPITAL_COMMUNITY)
Admission: EM | Admit: 2020-08-15 | Discharge: 2020-08-15 | Disposition: A | Discharge status: Home / Self Care | Payer: Medicaid Other | Attending: Emergency Medicine | Admitting: Emergency Medicine

## 2020-08-15 ENCOUNTER — Encounter (HOSPITAL_COMMUNITY): Payer: Self-pay | Admitting: Emergency Medicine

## 2020-08-15 DIAGNOSIS — Z21 Asymptomatic human immunodeficiency virus [HIV] infection status: Secondary | ICD-10-CM | POA: Diagnosis not present

## 2020-08-15 DIAGNOSIS — I10 Essential (primary) hypertension: Secondary | ICD-10-CM | POA: Diagnosis not present

## 2020-08-15 DIAGNOSIS — J45909 Unspecified asthma, uncomplicated: Secondary | ICD-10-CM | POA: Diagnosis not present

## 2020-08-15 DIAGNOSIS — L237 Allergic contact dermatitis due to plants, except food: Secondary | ICD-10-CM | POA: Insufficient documentation

## 2020-08-15 DIAGNOSIS — Z87891 Personal history of nicotine dependence: Secondary | ICD-10-CM | POA: Insufficient documentation

## 2020-08-15 MED ORDER — DIPHENHYDRAMINE HCL 50 MG/ML IJ SOLN
50.0000 mg | Freq: Once | INTRAMUSCULAR | Status: AC
  Administered 2020-08-15: 50 mg via INTRAMUSCULAR
  Filled 2020-08-15: qty 1

## 2020-08-15 MED ORDER — METHYLPREDNISOLONE SODIUM SUCC 125 MG IJ SOLR
125.0000 mg | Freq: Once | INTRAMUSCULAR | Status: AC
  Administered 2020-08-15: 125 mg via INTRAMUSCULAR
  Filled 2020-08-15: qty 2

## 2020-08-15 NOTE — ED Provider Notes (Signed)
[facility]-EMER[facility] DEPT Provider Note   CSN: [id number] Arrival date & time: 08/15/20  1246     History Chief Complaint  Patient presents with   Poison Ivy    [name] [name] is a 45 y.o. male.  45 year old male who presents with rash from poison ivy.  Patient does work outside in [employer].  Was seen here recently for same and given a dose of IM medications and discharged.  States he has been compliant with his medications.      Past Medical History:  Diagnosis Date   Aggression    Alcohol abuse    Asthma    Gonorrhea 02/09/2020   Grief 06/14/2020   HIV disease (HCC) 03/11/2019   Homeless    Hypertension    Penile discharge 05/19/2019   Schizophrenia [facility])     Patient Active Problem List   Diagnosis Date Noted   Grief 06/14/2020   Gonorrhea 02/09/2020   Penile discharge 05/19/2019   HIV disease (HCC) 03/11/2019   Cocaine abuse (HCC) 10/04/2015   Suicidal ideation 09/21/2015   Cannabis use disorder, severe, dependence (HCC) 06/18/2015   Behavior disturbance    Schizoaffective disorder, bipolar type (HCC) 04/28/2015   Adult antisocial behavior 04/04/2015   Polysubstance abuse (HCC) 04/04/2015   Homicidal ideation 10/16/2014   Obstructive sleep apnea 10/16/2014    Past Surgical History:  Procedure Laterality Date   KNEE SURGERY     MOUTH SURGERY         No family history on file.  Social History   Tobacco Use   Smoking status: Former    Pack years: 0.00   Smokeless tobacco: Never  Substance Use Topics   Alcohol use: Not Currently   Drug use: Not Currently    Types: Cocaine, Marijuana, Hydrocodone, MDMA (Ecstacy)    Comment: former; patient states he has been clean since 01/25/20    Home Medications Prior to Admission medications   Medication Sig Start Date End Date Taking? Authorizing Provider  benztropine (COGENTIN) 2 MG tablet Take 2 mg by mouth at bedtime. 04/26/20   [provider]   bictegravir-emtricitabine-tenofovir AF (BIKTARVY) 50-200-25 MG TABS tablet Take 1 tablet by mouth daily. 04/15/20   Randall Hiss, MD  cetirizine (ZYRTEC) 10 MG tablet Take 10 mg by mouth daily. 07/05/20   [provider]  chlorproMAZINE (THORAZINE) 100 MG tablet Take 1 tablet (100 mg total) by mouth every morning AND 3 tablets (300 mg total) every evening. Patient not taking: Reported on 08/02/2020 05/19/17   Samuel Jester, DO  chlorproMAZINE (THORAZINE) 200 MG tablet Take 400 mg by mouth at bedtime. 07/29/20   [provider]  haloperidol (HALDOL) 20 MG tablet Take 20 mg by mouth at bedtime. 04/26/20   [provider]  hydrOXYzine (ATARAX/VISTARIL) 25 MG tablet Take 1 tablet (25 mg total) by mouth every 6 (six) hours as needed for itching. 08/13/20   Joy, Shawn C, PA-C  levETIRAcetam (KEPPRA) 500 MG tablet Take 1 tablet (500 mg total) by mouth 2 (two) times daily. Patient not taking: Reported on 08/02/2020 04/15/20   Daiva Eves, Lisette Grinder, MD  lithium 300 MG tablet Take 1 tablet (300 mg total) by mouth 3 (three) times daily. Patient taking differently: Take 900 mg by mouth at bedtime. 05/19/17   Samuel Jester, DO  predniSONE (DELTASONE) 10 MG tablet Take 1 tablet (10 mg total) by mouth 4 (four) times daily - after meals and at bedtime for 5 days, THEN 1 tablet (  10 mg total) 3 (three) times daily with meals for 2 days, THEN 1 tablet (10 mg total) 2 (two) times daily with a meal for 2 days, THEN 1 tablet (10 mg total) daily with breakfast for 2 days, THEN 0.5 tablets (5 mg total) daily with breakfast for 4 days. 08/13/20 08/28/20  Joy, Shawn C, PA-C  valACYclovir (VALTREX) 500 MG tablet Take 1 tablet (500 mg total) by mouth daily. 03/01/20   Randall Hiss, MD    Allergies    Prunus persica, Pseudoephedrine hcl, Sudafed [pseudoephedrine], and Food  Review of Systems   Review of Systems  All other systems reviewed and are negative.  Physical Exam Updated Vital  Signs BP (!) 168/101 (BP Location: Left Arm)   Pulse 86   Temp 98.6 F (37 C) (Oral)   Resp 19   SpO2 100%   Physical Exam Vitals and nursing note reviewed.  Constitutional:      General: He is not in acute distress.    Appearance: Normal appearance. He is well-developed. He is not toxic-appearing.  HENT:     Head: Normocephalic and atraumatic.  Eyes:     General: Lids are normal.     Conjunctiva/sclera: Conjunctivae normal.     Pupils: Pupils are equal, round, and reactive to light.  Neck:     Thyroid: No thyroid mass.     Trachea: No tracheal deviation.  Cardiovascular:     Rate and Rhythm: Normal rate and regular rhythm.     Heart sounds: Normal heart sounds. No murmur heard.   No gallop.  Pulmonary:     Effort: Pulmonary effort is normal. No respiratory distress.     Breath sounds: Normal breath sounds. No stridor. No decreased breath sounds, wheezing, rhonchi or rales.  Abdominal:     General: There is no distension.     Palpations: Abdomen is soft.     Tenderness: There is no abdominal tenderness. There is no rebound.  Musculoskeletal:        General: No tenderness. Normal range of motion.     Cervical back: Normal range of motion and neck supple.  Skin:    General: Skin is warm and dry.     Findings: Rash present. No abrasion. Rash is vesicular.  Neurological:     Mental Status: He is alert and oriented to person, place, and time. Mental status is at baseline.     GCS: GCS eye subscore is 4. GCS verbal subscore is 5. GCS motor subscore is 6.     Cranial Nerves: Cranial nerves are intact. No cranial nerve deficit.     Sensory: No sensory deficit.     Motor: Motor function is intact.  Psychiatric:        Attention and Perception: Attention normal.        Speech: Speech normal.        Behavior: Behavior normal.    ED Results / Procedures / Treatments   Labs (all labs ordered are listed, but only abnormal results are displayed) Labs Reviewed - No data to  display  EKG None  Radiology No results found.  Procedures Procedures   Medications Ordered in ED Medications  diphenhydrAMINE (BENADRYL) injection 50 mg (has no administration in time range)  methylPREDNISolone sodium succinate (SOLU-MEDROL) 125 mg/2 mL injection 125 mg (has no administration in time range)    ED Course  I have reviewed the triage vital signs and the nursing notes.  Pertinent labs & imaging results that  were available during my care of the patient were reviewed by me and considered in my medical decision making (see chart for details).    MDM Rules/Calculators/A&P                          Rash consistent with poison ivy.  Given Benadryl here.  Will recommend same as well as continue steroids Final Clinical Impression(s) / ED Diagnoses Final diagnoses:  None    Rx / DC Orders ED Discharge Orders     None        Lorre Nick, MD 08/15/20 1310

## 2020-08-15 NOTE — ED Triage Notes (Signed)
Patient reports poison oak "all over" including genitals. States that he was seen on 6/24 for same given steroids with no relief.

## 2020-08-15 NOTE — ED Notes (Signed)
Patient requesting [facility] transport home. Non communication line called.

## 2020-08-15 NOTE — Discharge Instructions (Addendum)
Use Benadryl as directed continue taking the prednisone

## 2020-09-02 ENCOUNTER — Telehealth: Payer: Self-pay

## 2020-09-02 ENCOUNTER — Ambulatory Visit: Payer: Medicaid Other | Admitting: Infectious Disease

## 2020-09-02 NOTE — Telephone Encounter (Signed)
Called patient - left voice message regarding appointment with [place] this morning to see if he was able to make it or if he needs to reschedule.    Filomena Pokorney Lesli Albee, CMA

## 2021-04-20 ENCOUNTER — Telehealth: Payer: Self-pay

## 2021-04-20 NOTE — Telephone Encounter (Signed)
Received call from [place], [place]. She states patient is scheduled to be released from [facility] in [place] on 3/15. Scheduled patient for follow up with Dr. Tommy Medal 3/20. ? ?Beryle Flock, RN ? ?

## 2021-05-09 ENCOUNTER — Ambulatory Visit: Payer: Self-pay | Admitting: Infectious Disease

## 2021-05-17 ENCOUNTER — Telehealth: Payer: Self-pay

## 2021-05-17 ENCOUNTER — Other Ambulatory Visit: Payer: Self-pay

## 2021-05-17 ENCOUNTER — Ambulatory Visit (INDEPENDENT_AMBULATORY_CARE_PROVIDER_SITE_OTHER): Payer: Self-pay | Admitting: Infectious Disease

## 2021-05-17 ENCOUNTER — Other Ambulatory Visit (HOSPITAL_COMMUNITY)
Admission: RE | Admit: 2021-05-17 | Discharge: 2021-05-17 | Disposition: A | Discharge status: Home / Self Care | Payer: Self-pay | Source: Ambulatory Visit | Attending: Infectious Disease | Admitting: Infectious Disease

## 2021-05-17 ENCOUNTER — Encounter: Payer: Self-pay | Admitting: Infectious Disease

## 2021-05-17 VITALS — BP 132/85 | HR 73 | Temp 97.8°F | Ht 70.0 in | Wt 213.0 lb

## 2021-05-17 DIAGNOSIS — F1910 Other psychoactive substance abuse, uncomplicated: Secondary | ICD-10-CM | POA: Insufficient documentation

## 2021-05-17 DIAGNOSIS — F250 Schizoaffective disorder, bipolar type: Secondary | ICD-10-CM | POA: Insufficient documentation

## 2021-05-17 DIAGNOSIS — B20 Human immunodeficiency virus [HIV] disease: Secondary | ICD-10-CM | POA: Insufficient documentation

## 2021-05-17 MED ORDER — VALACYCLOVIR HCL 500 MG PO TABS: 500.0000 mg | ORAL_TABLET | Freq: Every day | ORAL | 11 refills | Status: DC

## 2021-05-17 MED ORDER — BIKTARVY 50-200-25 MG PO TABS: 1.0000 | ORAL_TABLET | Freq: Every day | ORAL | 11 refills | Status: DC
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
## 2021-05-17 NOTE — Telephone Encounter (Signed)
Called patient to see if he would be able to make it to today's appointment, no answer. Voicemail greeting belonged to patient's spouse. Did not leave message. ? ?Beryle Flock, RN ? ?

## 2021-05-18 LAB — MICROALBUMIN / CREATININE URINE RATIO
Creatinine, Urine: 49 mg/dL (ref 20–320)
Microalb, Ur: <0.2 mg/dL

## 2021-05-18 LAB — URINE CYTOLOGY ANCILLARY ONLY
Chlamydia: NEGATIVE
Comment: NEGATIVE
Comment: NORMAL
Neisseria Gonorrhea: NEGATIVE

## 2021-05-18 LAB — T-HELPER CELLS (CD4) COUNT (NOT AT ARMC)
CD4 % Helper T Cell: 36 % (ref 33–65)
CD4 T Cell Abs: 324 /uL — ABNORMAL LOW (ref 400–1790)

## 2021-05-19 LAB — CBC WITH DIFFERENTIAL/PLATELET
Absolute Monocytes: 436 cells/uL (ref 200–950)
Basophils Absolute: 20 cells/uL (ref 0–200)
Basophils Relative: 0.4 %
Eosinophils Absolute: 78 cells/uL (ref 15–500)
Eosinophils Relative: 1.6 %
HCT: 47.4 % (ref 38.5–50.0)
Hemoglobin: 15.6 g/dL (ref 13.2–17.1)
Lymphs Abs: 1005 cells/uL (ref 850–3900)
MCH: 32.3 pg (ref 27.0–33.0)
MCHC: 32.9 g/dL (ref 32.0–36.0)
MCV: 98.1 fL (ref 80.0–100.0)
MPV: 11.4 fL (ref 7.5–12.5)
Monocytes Relative: 8.9 %
Neutro Abs: 3361 cells/uL (ref 1500–7800)
Neutrophils Relative %: 68.6 %
Platelets: 188 10*3/uL (ref 140–400)
RBC: 4.83 10*6/uL (ref 4.20–5.80)
RDW: 11.3 % (ref 11.0–15.0)
Total Lymphocyte: 20.5 %
WBC: 4.9 10*3/uL (ref 3.8–10.8)

## 2021-05-19 LAB — COMPLETE METABOLIC PANEL WITH GFR
AG Ratio: 1.5 (calc) (ref 1.0–2.5)
ALT: 19 U/L (ref 9–46)
AST: 17 U/L (ref 10–40)
Albumin: 4.1 g/dL (ref 3.6–5.1)
Alkaline phosphatase (APISO): 92 U/L (ref 36–130)
BUN: 11 mg/dL (ref 7–25)
CO2: 29 mmol/L (ref 20–32)
Calcium: 9.9 mg/dL (ref 8.6–10.3)
Chloride: 110 mmol/L (ref 98–110)
Creat: 1.27 mg/dL (ref 0.60–1.29)
Globulin: 2.8 g/dL (calc) (ref 1.9–3.7)
Glucose, Bld: 88 mg/dL (ref 65–99)
Potassium: 3.7 mmol/L (ref 3.5–5.3)
Sodium: 144 mmol/L (ref 135–146)
Total Bilirubin: 0.7 mg/dL (ref 0.2–1.2)
Total Protein: 6.9 g/dL (ref 6.1–8.1)
eGFR: 71 mL/min/{1.73_m2} (ref >=60)

## 2021-05-19 LAB — LIPID PANEL
Cholesterol: 176 mg/dL (ref <200)
HDL: 49 mg/dL (ref >=40)
LDL Cholesterol (Calc): 112 mg/dL (calc) — ABNORMAL HIGH
Non-HDL Cholesterol (Calc): 127 mg/dL (calc) (ref <130)
Total CHOL/HDL Ratio: 3.6 (calc) (ref <5.0)
Triglycerides: 64 mg/dL (ref <150)

## 2021-05-19 LAB — HIV-1 RNA QUANT-NO REFLEX-BLD
HIV 1 RNA Quant: NOT DETECTED Copies/mL
HIV-1 RNA Quant, Log: NOT DETECTED Log cps/mL

## 2021-05-19 LAB — RPR: RPR Ser Ql: NONREACTIVE

## 2021-07-12 ENCOUNTER — Ambulatory Visit (HOSPITAL_COMMUNITY)
Admission: EM | Admit: 2021-07-12 | Discharge: 2021-07-12 | Disposition: A | Discharge status: Home / Self Care | Payer: Medicaid Other | Attending: Psychiatry | Admitting: Psychiatry

## 2021-07-12 ENCOUNTER — Encounter (HOSPITAL_COMMUNITY): Payer: Self-pay

## 2021-07-12 ENCOUNTER — Other Ambulatory Visit: Payer: Self-pay

## 2021-07-12 ENCOUNTER — Emergency Department (HOSPITAL_COMMUNITY)
Admission: EM | Admit: 2021-07-12 | Discharge: 2021-07-13 | Disposition: A | Discharge status: Home / Self Care | Payer: Medicaid Other | Attending: Emergency Medicine | Admitting: Emergency Medicine

## 2021-07-12 DIAGNOSIS — F419 Anxiety disorder, unspecified: Secondary | ICD-10-CM | POA: Insufficient documentation

## 2021-07-12 DIAGNOSIS — F22 Delusional disorders: Secondary | ICD-10-CM | POA: Insufficient documentation

## 2021-07-12 DIAGNOSIS — Z79899 Other long term (current) drug therapy: Secondary | ICD-10-CM | POA: Insufficient documentation

## 2021-07-12 DIAGNOSIS — Z7689 Persons encountering health services in other specified circumstances: Secondary | ICD-10-CM

## 2021-07-12 MED ORDER — HALOPERIDOL DECANOATE 100 MG/ML IM SOLN
200.0000 mg | INTRAMUSCULAR | Status: AC
  Administered 2021-07-12: 200 mg via INTRAMUSCULAR
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
## 2021-07-12 NOTE — Progress Notes (Signed)
   07/12/21 1618  [facility] Triage Screening (Walk-ins at [facility] only)  How Did You Hear About [place]? Self  What Is the Reason for Your Visit/Call Today? Patient presents requesting his haldol dec injection, after showing for his appointment with Dr. Jannifer Franklin too late.  Patient has been compliant with medications, and does not want to miss this dose that is due now.  He denies SI, HI, AVH or substance use.  Provider has agreed to administer IM medicaiton.  How Long Has This Been Causing You Problems? <Week  Have You Recently Had Any Thoughts About Hurting Yourself? No  Are You Planning to Commit Suicide/Harm Yourself At This time? No  Have you Recently Had Thoughts About Hurting Someone Karolee Ohs? No  Are You Planning To Harm Someone At This Time? No  Are you currently experiencing any auditory, visual or other hallucinations? No  Have You Used Any Alcohol or Drugs in the Past 24 Hours? No  Do you have any current medical co-morbidities that require immediate attention? No  Clinician description of patient physical appearance/behavior: Casually dressed, calm, cooperative AAOx5  What Do You Feel Would Help You the Most Today? Medication(s)  If access to [facility] was not available, would you have sought care in the Emergency Department? No  Determination of Need Routine (7 days)  Options For Referral Medication Management

## 2021-07-12 NOTE — Discharge Instructions (Addendum)

## 2021-07-12 NOTE — ED Provider Notes (Signed)
Behavioral Health Urgent Care Medical Screening Exam  Patient Name: Pierson Vantol MRN: [id number] Date of Evaluation: 07/12/21 Chief Complaint:   Diagnosis:  Final diagnoses:  Encounter for medication administration    History of Present illness: Dyllin Gulley is a 46 y.o. male patient presented to [facility] as a walk in accompanied by his girlfriend requesting that we administer his haldol injection.   [name], 46 y.o., male patient seen face to face by this provider, consulted with Dr. Viviano Simas; and chart reviewed on 07/12/21. Per chart review patient has a psychiatric history of schizoaffective disorder. He has services in place with [facility]. He does not remember what medications he is prescribed but states he does take the Haldol injection monthly and it is due today. Reports they missed the injection appointment today.   During evaluation Jeet Matas is observed sitting in the lobby in no acute distress.  He is alert/oriented x 4 and cooperative. He is speaking in a clear tone at moderate volume, and normal pace; with fair eye contact. His thought process appears to be coherent and relevant. He does not appear disorganized. He is attentive but easily distracted. He denies AHV/SI/HI.  There is no indication that he is currently responding to internal/external stimuli. He does appear paranoid and is staring at security. He was able to remained calm and answered questions appropriately.    Patient had his home medication of Haldol Decanoate 200 mg (2 vials) that had been picked up from pharmacy. Medications were verified and was administered in his Right arm per his request by nursing staff. Patient reported no concerns after receiving injection and had no side affects. Discussed outpatient services with [facility] services on the second floor.   At this time Mattie Novosel is educated and verbalizes understanding of  mental health resources and other crisis services in the community. He is instructed to call 911 and present to the nearest emergency room should he experience any suicidal/homicidal ideation, auditory/visual/hallucinations, or detrimental worsening of his mental health condition.  He was a also advised by Clinical research associate that he could call the toll-free phone on insurance card to assist with identifying in network counselors and agencies or number on back of Medicaid card to speak with care coordinator   Psychiatric Specialty Exam  Presentation  General Appearance:Appropriate for Environment; Casual  Eye Contact:Fair  Speech:Clear and Coherent; Normal Rate  Speech Volume:Normal  Handedness:Right   Mood and Affect  Mood:Dysphoric; Anxious  Affect:Congruent   Thought Process  Thought Processes:Coherent  Descriptions of Associations:Intact  Orientation:Full (Time, Place and Person)  Thought Content:Paranoid Ideation  Diagnosis of Schizophrenia or Schizoaffective disorder in past: Yes   Hallucinations:None decnies today  Ideas of Reference:None  Suicidal Thoughts:No  Homicidal Thoughts:No Without Intent; Without Means to Carry Out; Without Access to Means   Sensorium  Memory:Immediate Good; Recent Good; Remote Good  Judgment:Fair  Insight:Fair   Executive Functions  Concentration:Fair  Attention Span:Fair  Recall:Fair  Fund of Knowledge:Fair  Language:Good   Psychomotor Activity  Psychomotor Activity:Normal   Assets  Assets:Communication Skills; Desire for Improvement; Financial Resources/Insurance; Physical Health; Resilience; Social Support   Sleep  Sleep:Good  Number of hours: No data recorded  No data recorded  Physical Exam: Physical Exam Vitals and nursing note reviewed.  Constitutional:      Appearance: Normal appearance. He is well-developed.  HENT:     Head: Normocephalic and atraumatic.  Eyes:     General:  Right eye: No  discharge.        Left eye: No discharge.     Conjunctiva/sclera: Conjunctivae normal.  Cardiovascular:     Rate and Rhythm: Normal rate.  Pulmonary:     Effort: Pulmonary effort is normal. No respiratory distress.  Musculoskeletal:        General: Normal range of motion.     Cervical back: Normal range of motion.  Skin:    Coloration: Skin is not jaundiced or pale.  Neurological:     Mental Status: He is alert and oriented to person, place, and time.  Psychiatric:        Attention and Perception: He is inattentive. He does not perceive auditory hallucinations.        Mood and Affect: Mood is anxious.        Speech: Speech normal.        Behavior: Behavior is cooperative.        Thought Content: Thought content is paranoid.        Cognition and Memory: Cognition normal.        Judgment: Judgment is impulsive.   Review of Systems  Constitutional: Negative.   HENT: Negative.    Eyes: Negative.   Respiratory: Negative.    Cardiovascular: Negative.   Gastrointestinal: Negative.   Genitourinary: Negative.   Musculoskeletal: Negative.   Skin: Negative.   Neurological: Negative.   Endo/Heme/Allergies: Negative.   Psychiatric/Behavioral:  The patient is nervous/anxious.   There were no vitals taken for this visit. There is no height or weight on file to calculate BMI.  Musculoskeletal: Strength & Muscle Tone: within normal limits Gait & Station: normal Patient leans: N/A   [facility] MSE Discharge Disposition for Follow up and Recommendations: Based on my evaluation the patient does not appear to have an emergency medical condition and can be discharged with resources and follow up care in outpatient services for Medication Management and Individual Therapy  Discharge patient   Follow up with Dr. Mervyn Skeeters   Patient had his home medication of Haldol Decanoate 200 mg (2 vials) Q4W, that had been picked up from [facility]. Medication were verified and was administered in his Right arm  per his request by nursing staff. Patient reported no concerns after receiving injection and had no side affects. Discussed outpatient services with [facility] services on the second floor.   No evidence of imminent risk to self or others at present.    Patient does not meet criteria for psychiatric inpatient admission. Discussed crisis plan, support from social network, calling 911, coming to the Emergency Department, and calling Suicide Hotline.   Ardis Hughs, NP 07/12/2021, 4:45 PM

## 2021-07-13 MED ORDER — LORAZEPAM 1 MG PO TABS
1.0000 mg | ORAL_TABLET | Freq: Once | ORAL | Status: AC
  Administered 2021-07-13: 1 mg via ORAL
  Filled 2021-07-13: qty 1

## 2021-07-13 MED ORDER — HYDROXYZINE HCL 25 MG PO TABS: 25.0000 mg | ORAL_TABLET | Freq: Three times a day (TID) | ORAL | 0 refills | Status: DC | PRN

## 2021-07-13 NOTE — ED Provider Notes (Signed)
[facility]-EMERGENCY DEPT Provider Note   CSN: [id number] Arrival date & time: 07/12/21  2305     History  Chief Complaint  Patient presents with   Psychiatric Evaluation    [name] [name] is a 46 y.o. male.  The history is provided by the patient and medical records.  [name] [name] is a 46 y.o. male who presents to the Emergency Department complaining of medication request.  He presents to the emergency department requesting a medication and injection form to help him sleep.  He states that when his wife was out of town there are dogs were behaving well but when she returned to town from vacation that his wife's dog ate his Advertising account planner.  He was very upset about this and he wanted to take it out on the dog but he did not.  He is hoping to receive a medication to help him sleep so is not upset.  He is compliant with his home medications.  He denies any SI, HI.  He was seen at behavioral health yesterday and received Haldol injection.  He also reports drinking 30 Coca-Cola's today.    Home Medications Prior to Admission medications   Medication Sig Start Date End Date Taking? Authorizing Provider  benztropine (COGENTIN) 2 MG tablet Take 2 mg by mouth at bedtime. 04/26/20   [provider]  bictegravir-emtricitabine-tenofovir AF (BIKTARVY) 50-200-25 MG TABS tablet Take 1 tablet by mouth daily. 05/17/21   Randall Hiss, MD  cetirizine (ZYRTEC) 10 MG tablet Take 10 mg by mouth daily. 07/05/20   [provider]  chlorproMAZINE (THORAZINE) 100 MG tablet Take 1 tablet (100 mg total) by mouth every morning AND 3 tablets (300 mg total) every evening. 05/19/17   Samuel Jester, DO  chlorproMAZINE (THORAZINE) 200 MG tablet Take 400 mg by mouth at bedtime. 07/29/20   [provider]  haloperidol (HALDOL) 20 MG tablet Take 20 mg by mouth at bedtime. 04/26/20   [provider]  hydrOXYzine (ATARAX) 25 MG tablet Take 1 tablet (25 mg  total) by mouth every 8 (eight) hours as needed for itching. 07/13/21   Tilden Fossa, MD  levETIRAcetam (KEPPRA) 500 MG tablet Take 1 tablet (500 mg total) by mouth 2 (two) times daily. Patient not taking: Reported on 08/02/2020 04/15/20   Daiva Eves, Lisette Grinder, MD  lithium 300 MG tablet Take 1 tablet (300 mg total) by mouth 3 (three) times daily. Patient taking differently: Take 900 mg by mouth at bedtime. 05/19/17   Samuel Jester, DO  valACYclovir (VALTREX) 500 MG tablet Take 1 tablet (500 mg total) by mouth daily. 05/17/21   Randall Hiss, MD      Allergies    Prunus persica, Pseudoephedrine hcl, Sudafed [pseudoephedrine], and Food    Review of Systems   Review of Systems  All other systems reviewed and are negative.  Physical Exam Updated Vital Signs BP (!) 142/90 (BP Location: Right Arm)   Pulse 84   Temp 98.9 F (37.2 C) (Oral)   Resp 20   SpO2 96%  Physical Exam Vitals and nursing note reviewed.  Constitutional:      Appearance: He is well-developed.  HENT:     Head: Normocephalic and atraumatic.  Cardiovascular:     Rate and Rhythm: Normal rate and regular rhythm.  Pulmonary:     Effort: Pulmonary effort is normal. No respiratory distress.  Musculoskeletal:        General: No tenderness.  Skin:  General: Skin is warm and dry.  Neurological:     Mental Status: He is alert and oriented to person, place, and time.  Psychiatric:     Comments: Hyperactive but not psychotic.  He is redirectable.    ED Results / Procedures / Treatments   Labs (all labs ordered are listed, but only abnormal results are displayed) Labs Reviewed - No data to display  EKG None  Radiology No results found.  Procedures Procedures    Medications Ordered in ED Medications  LORazepam (ATIVAN) tablet 1 mg (1 mg Oral Given 07/13/21 0108)    ED Course/ Medical Decision Making/ A&P                           Medical Decision Making Risk Prescription drug  management.   Patient with history of HIV, schizophrenia, alcohol abuse here for evaluation of medication request for help sleeping.  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  On evaluation patient is hyperactive but not acutely psychotic, suicidal or homicidal.  Discussed that injection is not necessary at this point in time.  We will give one-time dose of oral Ativan.  Will prescribe hydroxyzine as needed for home.  Discussed behavioral health follow-up and return precautions.       Final Clinical Impression(s) / ED Diagnoses Final diagnoses:  Anxiety    Rx / DC Orders ED Discharge Orders          Ordered    hydrOXYzine (ATARAX) 25 MG tablet  Every 8 hours PRN        07/13/21 0118              Tilden Fossa, MD 07/13/21 [phone]

## 2021-07-14 ENCOUNTER — Ambulatory Visit: Payer: Medicaid Other | Admitting: Infectious Disease

## 2021-07-25 ENCOUNTER — Ambulatory Visit: Payer: Medicaid Other | Admitting: Infectious Disease

## 2021-08-22 ENCOUNTER — Emergency Department (HOSPITAL_COMMUNITY)
Admission: EM | Admit: 2021-08-22 | Discharge: 2021-08-22 | Disposition: A | Discharge status: Home / Self Care | Payer: 59 | Attending: Emergency Medicine | Admitting: Emergency Medicine

## 2021-08-22 ENCOUNTER — Encounter (HOSPITAL_COMMUNITY): Payer: Self-pay | Admitting: *Deleted

## 2021-08-22 ENCOUNTER — Other Ambulatory Visit: Payer: Self-pay

## 2021-08-22 DIAGNOSIS — F209 Schizophrenia, unspecified: Secondary | ICD-10-CM | POA: Diagnosis not present

## 2021-08-22 DIAGNOSIS — R440 Auditory hallucinations: Secondary | ICD-10-CM | POA: Insufficient documentation

## 2021-08-22 DIAGNOSIS — Z760 Encounter for issue of repeat prescription: Secondary | ICD-10-CM | POA: Diagnosis present

## 2021-08-22 DIAGNOSIS — Z21 Asymptomatic human immunodeficiency virus [HIV] infection status: Secondary | ICD-10-CM | POA: Insufficient documentation

## 2021-08-22 MED ORDER — LORAZEPAM 2 MG/ML IJ SOLN
2.0000 mg | Freq: Once | INTRAMUSCULAR | Status: DC
  Filled 2021-08-22: qty 1

## 2021-08-22 NOTE — ED Triage Notes (Signed)
BIB by [employer] requesting a shot to calm him down. Pt spoke with wife who clarified he has an appointment with Psych for his regular injection at 1030 today. She will come transport him to the office. [employer] is with pt.
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
## 2021-08-22 NOTE — Discharge Instructions (Addendum)
Schizophrenia/Mental Health Resources ?National Suicide Prevention Lifeline ?1-800-273-TALK (8255) ?http://www.suicidepreventionlifeline.org/ ??988?-Mental Health Crisis Line ? ?National Schizophrenia Foundation ?https://sczaction.org/ ?National Institute of Mental Health ?1-866-615-6464 ?nimhinfo@nih.gov (e-mail) ?www.nimh.nih.gov ?Schizophrenia & Psychosis Action Alliance ?800-493-2094 ?info@sczaction.org ?https://sczaction.org/  ?5 Additional Healthline identified ?Best online Schizophrenia Support Groups? ?Students with Psychosis ? ?Schizophrenia Spectrum Support ? ?Supportiv ($30 monthly fee)  ? ?NAMI Connection Recovery Support Group ? ?Schizophrenia Alliance ? ?Local Resources: ?Outpatient:  ? ?[facility]:  ?([phone] ?[address]   [place], [place]  [address] ?  ? ? ?https://www.[place].gov/services/[place]-[facility]-health-centers#contact ? ? ?[place]  ?[facility] [place] ?[address] ?[place], [place] [address] ?336-992-5100 ? ?[place]  ?[facility] [place] ?[address] ?[place], [place]  [address] ?[phone] ?Local Resources ?(Inpatient) ? ?[place] [facility] ?[address]  ?[place], [place] [address] ?[phone] ? ?[facility] ?Behavioral Medicine Unit and Geriatric Psychiatric Unit   ?[address]  ?[place], [place] [address] ?[phone] ? ? ?[facility] ?https://naminwpiedmontnc.org/support-and-education/mental-health-education/ ? ?NAMI -[place] ?https://namiguilford.org/ ? ?Community Housing and Support Resources: ? ?[facility] Homeless[facility] ?https://pehgc.org/ ? ?Interactive Resource Center ?https://www.interactiveresourcecenter.org/ ? ?[facility] ?https://www.greensborourbanministry.org/ ? ?[facility] [place] (adult men's shelter) ?www.opendoorministrieshp.org ?[address] ?[place], [place] [address] ?336-885-0191 ?[facility] [place] [facility] ?[address] [place], [place] [address] ?[phone] ? ? ?VA's National Homeless Call Center ?1-877-4AID VET (1-877-424-3838) ? ?Veterans Crisis Line ?1-800-273-8255 press 1 ?Confidential chat-VeteransCrisisLine.net ?Or Text to 838255 ? ?United Way ?Call 211 or 1-888-892-1162 ?www.NC211.org ? ?Affordable Housing Resources in [place] ?nchousingsearch.com   ?1-877-428-8844 ? ?Shelters ? ?[place] [facility] Housing Hotline ?336-691-9521 ?8:30am-5:30pm ?[facility] ?[address] ?[place], [place]  [address] ?336-886-5594 ?Male veterans 18+ with substance abuse issues ?Eligibility:  By Referral Only ? ?[facility] ?[address] ?[place], [place] [address] ?[phone] Ext. 347 ?Adult Men & Women ?Eligibility: Valid ID & Social Security Card ?www.greensborourbanministry.org ? ?[facility] ?[address] ?[place], [place]  [address] ?336-886-5594 ?Male veterans 18+ with substance abuse issues ?Eligibility:  By Referral Only ? ?[facility] ?[address] ?[place], [place]  [address] ?336-884-1039 ?Single women 18+ without dependents ?Open 6pm-8am ?Eligibility:  Valid ID & Social Security Card ?Call to check availability  ?http://westendministries.org/leslieshouse.aspx ? ?[facility] ?[address] ?[place], [place]  [address] ?[phone] ?Male veterans 18+ with substance abuse/mental ?health issues ?Eligibility:  By Referral Only ? ?[facility] ?[address] ?[place], [place] [address] ?[phone] ?Call to check availability ?Males 18+ ?Eligibility: Valid ID & Social Security Card ?www.odm-hp.org ? ?[facility] [place] ?[address] ?[place], [place] [address] ?336-881-5400 ?Women 18+ & Families with children ?Eligibility:  Valid ID  & Criminal Background Check ?www.salvationarmycarolinas.org/commands/[place] ? ? ?[facility] [place] (Care Management): 877-566-0943 ?[facility] [place] [facility]: Behavioral Health 24 Hour Phone Line 1-800-853-5163 ?NAMI Hotline 336-370-4264 ?NAMI [place] 919-788-0801 ? ?2-1-1 Referral Service ?United Way 211 ?Call 211 or 1-888-892-1162 ?www.NC211.org ?A free United Way, 24/7 telephone information and referral service to help link citizens who are seeking help with the community resources they need. In [place], just dial 211 from your phone. ?Mental Health Association in  336-373-1402 ?Support groups for anxiety, depression and bipolar disorder, schizophrenia,   family and friends, aftermath of suicide, and mental wellness for Latinos (in Spanish). ?Mental Health Association in [place] 336-883-7480 ?Offers support groups, [facility] program offers. Psychological, vocational, educational and other rehabilitation services to those who suffer from mental health illness of the 18 and up (5 days a week/5hours a day), offer out patient services like diagnostic evaluation, comprehensive clinical assessments, individual counseling, group therapy, psycho-educational workshops, referral to other specialists, referral to a psychiatrist for an evaluation for medication, consultation, and outreach/training. ?[facility] ([facility] Drug Services) ?(336) 812-8645 336-333-6860 ?Substance Abuse education, prevention and treatment (detox, assessments, intensive outpatient and inpatient counseling and programs). ?[facility] ?[place] (336) 370-0195, [place] (336) 883-7480 ?Support groups for posttraumatic stress, depression, and schizophrenia? as well as day programs for individuals with severe mental illness. ?[facility] [place] (336) [phone] ?[facility]-FREE-336-275-7896 ?[facility] 336-429-5600 or www.kellinfoundation.org ?Telehealth platform, Individual counseling across  the lifespan for both mental health and substance use, support groups, advocacy, case management, virtual villages, resource coordination. ?[facility]- 1-800-256-2452 ?[facility]-FREE-336-676-6840 or 1-800-853-5163 ?336-676-6849. Provides mental health services to all residents regardless of ability to pay. [address], [place]. [place]. 24 ?Domestic Violence Crisis Line ?[facility] ?Call for shelter and/or safety planning ?[facility] ?336-889-7273 (24/7) ?[facility] ?336-273-7273 (24/7) ? ?VA Homeless Hotline ?877-424-3838 ? ?Veterans Crisis Line ?1-800-273-8255 press 1 ?Confidential chat-VeteransCrisisLine.net ?Or Text to 838255 ? ?[facility] [place] [facility] ?[address] ?[place], [place] [address] ?336-899-1505 ?Hours: Mon-Fri. 8am-5pm ?[facility] Mobile Crisis Management ?Mobile crisis response for mental health, substance abuse or intellectual/developmental disabilities ?1-877-626-1772 ? ? ? ? ?Disclaimer: This resource list is subject to change at any time and is a starting point for resource identification as of 02/18/2021.  ? ?

## 2021-08-22 NOTE — ED Notes (Signed)
Dr Karene Fry evaluated pt and is in agreement pt should go receive his Invega injection as scheduled.

## 2021-09-15 ENCOUNTER — Encounter (HOSPITAL_COMMUNITY): Payer: Self-pay | Admitting: Emergency Medicine

## 2021-09-15 ENCOUNTER — Emergency Department (HOSPITAL_COMMUNITY)
Admission: EM | Admit: 2021-09-15 | Discharge: 2021-09-15 | Disposition: A | Discharge status: Home / Self Care | Payer: No Typology Code available for payment source | Attending: Emergency Medicine | Admitting: Emergency Medicine

## 2021-09-15 DIAGNOSIS — K0889 Other specified disorders of teeth and supporting structures: Secondary | ICD-10-CM | POA: Diagnosis present

## 2021-09-15 MED ORDER — AMOXICILLIN-POT CLAVULANATE 875-125 MG PO TABS: 1.0000 | ORAL_TABLET | Freq: Two times a day (BID) | ORAL | 0 refills | Status: DC

## 2021-09-15 MED ORDER — HYDROCODONE-ACETAMINOPHEN 5-325 MG PO TABS
1.0000 | ORAL_TABLET | Freq: Once | ORAL | Status: AC
  Administered 2021-09-15: 1 via ORAL
  Filled 2021-09-15: qty 1

## 2021-09-15 MED ORDER — HYDROCODONE-ACETAMINOPHEN 5-325 MG PO TABS: 1.0000 | ORAL_TABLET | Freq: Four times a day (QID) | ORAL | 0 refills | Status: AC | PRN

## 2021-09-15 NOTE — ED Provider Notes (Signed)
[facility]-EMERGENCY DEPT Provider Note   CSN: [id number] Arrival date & time: 09/15/21  1702     History  Chief Complaint  Patient presents with   Dental Pain    [name] [name] is a 46 y.o. male.  Patient with no pertinent past medical history presents today with complaints of dental pain. States that Tuesday he had all his teeth pulled and was placed on antibiotics and anti-inflammatory medication. States that he was taking both as prescribed but misplaced his medication yesterday and hasnt been able to locate it. States that anti-inflammatory medication has not been helping his symptoms and he is having difficulty eating due to pain. Denies fevers, chills, facial swelling, nausea, or vomiting.  The history is provided by the patient. No language interpreter was used.  Dental Pain      Home Medications Prior to Admission medications   Medication Sig Start Date End Date Taking? Authorizing Provider  benztropine (COGENTIN) 2 MG tablet Take 2 mg by mouth at bedtime. 04/26/20   [provider]  bictegravir-emtricitabine-tenofovir AF (BIKTARVY) 50-200-25 MG TABS tablet Take 1 tablet by mouth daily. 05/17/21   Randall Hiss, MD  cetirizine (ZYRTEC) 10 MG tablet Take 10 mg by mouth daily. 07/05/20   [provider]  chlorproMAZINE (THORAZINE) 100 MG tablet Take 1 tablet (100 mg total) by mouth every morning AND 3 tablets (300 mg total) every evening. 05/19/17   Samuel Jester, DO  chlorproMAZINE (THORAZINE) 200 MG tablet Take 400 mg by mouth at bedtime. 07/29/20   [provider]  haloperidol (HALDOL) 20 MG tablet Take 20 mg by mouth at bedtime. 04/26/20   [provider]  hydrOXYzine (ATARAX) 25 MG tablet Take 1 tablet (25 mg total) by mouth every 8 (eight) hours as needed for itching. 07/13/21   Tilden Fossa, MD  levETIRAcetam (KEPPRA) 500 MG tablet Take 1 tablet (500 mg total) by mouth 2 (two) times daily. Patient not  taking: Reported on 08/02/2020 04/15/20   Daiva Eves, Lisette Grinder, MD  lithium 300 MG tablet Take 1 tablet (300 mg total) by mouth 3 (three) times daily. Patient taking differently: Take 900 mg by mouth at bedtime. 05/19/17   Samuel Jester, DO  valACYclovir (VALTREX) 500 MG tablet Take 1 tablet (500 mg total) by mouth daily. 05/17/21   Randall Hiss, MD      Allergies    Prunus persica, Pseudoephedrine hcl, Sudafed [pseudoephedrine], and Food    Review of Systems   Review of Systems  HENT:  Positive for dental problem.   All other systems reviewed and are negative.   Physical Exam Updated Vital Signs BP (!) 149/95  Physical Exam Vitals and nursing note reviewed.  Constitutional:      General: He is not in acute distress.    Appearance: Normal appearance. He is normal weight. He is not ill-appearing, toxic-appearing or diaphoretic.  HENT:     Head: Normocephalic and atraumatic.     Comments: Mouth status post recent multiple tooth extractions without any signs of swelling or abscess. No swelling under the tongue.     Mouth/Throat:     Mouth: Mucous membranes are moist.  Eyes:     Extraocular Movements: Extraocular movements intact.     Pupils: Pupils are equal, round, and reactive to light.  Cardiovascular:     Rate and Rhythm: Normal rate.  Pulmonary:     Effort: Pulmonary effort is normal. No respiratory distress.  Musculoskeletal:  General: Normal range of motion.     Cervical back: Normal range of motion and neck supple. No tenderness.  Skin:    General: Skin is warm and dry.  Neurological:     General: No focal deficit present.     Mental Status: He is alert.  Psychiatric:        Mood and Affect: Mood normal.        Behavior: Behavior normal.     ED Results / Procedures / Treatments   Labs (all labs ordered are listed, but only abnormal results are displayed) Labs Reviewed - No data to display  EKG None  Radiology No results  found.  Procedures Procedures    Medications Ordered in ED Medications  HYDROcodone-acetaminophen (NORCO/VICODIN) 5-325 MG per tablet 1 tablet (has no administration in time range)    ED Course/ Medical Decision Making/ A&P                           Medical Decision Making  Patient presents today with pain after having all his teeth extracted. States he misplaced his antibiotic and anti-inflammatory medication yesterday.  He is afebrile, nontoxic-appearing, in no acute distress with reassuring vital signs.  Physical exam reveals no gross abscess.  Exam unconcerning for Ludwig's angina or spread of infection.  He is unable to recall the antibiotic he was given.  Will treat with Augmentin.  We will also give meloxicam 2 days worth of Vicodin.  I have personally reviewed PDMP and deemed patient may have adequate candidate for narcotic pain medication. Urged patient to follow-up with his dentist.  He is stable for discharge at this time, educated on red flag symptoms that would prompt immediate return.  Discharged in stable condition.   Final Clinical Impression(s) / ED Diagnoses Final diagnoses:  Pain, dental    Rx / DC Orders ED Discharge Orders          Ordered    HYDROcodone-acetaminophen (NORCO/VICODIN) 5-325 MG tablet  Every 6 hours PRN        09/15/21 1749    amoxicillin-clavulanate (AUGMENTIN) 875-125 MG tablet  Every 12 hours        09/15/21 1749          An After Visit Summary was printed and given to the patient.     Vear Clock 09/15/21 1750    Terrilee Files, MD 09/16/21 [phone]

## 2021-09-15 NOTE — ED Triage Notes (Signed)
Pt BIB EMS coming from home c/o mouth pain from surgery last week. Per EMS he had all his teeth extracted and is currently gums only. States he was given antibiotics and pain medications and misplaced them.   152 palpated 100 14 96% room air CBG 122 

## 2021-09-15 NOTE — Discharge Instructions (Signed)
As we discussed, I have given you medication to help with pain from your surgery as well as antibiotics to help prevent infection.  I recommend that you fill these medications and take them as prescribed.  Please do not drive or operate heavy machinery after taking narcotic pain medication as it can be sedating.  Follow-up with your dentist for any further needs.  Return if development of any new or worsening symptoms.

## 2021-09-15 NOTE — ED Triage Notes (Signed)
Pt BIB EMS coming from home c/o mouth pain from surgery last week. Per EMS he had all his teeth extracted and is currently gums only. States he was given antibiotics and pain medications and misplaced them.   152 palpated 100 14 96% room air CBG 122

## 2021-09-15 NOTE — ED Notes (Signed)
Triage completed by RN Garald Balding.

## 2021-09-15 NOTE — ED Notes (Signed)
All triage performed by RN Durwin Reges.

## 2021-09-25 ENCOUNTER — Emergency Department (HOSPITAL_COMMUNITY)
Admission: EM | Admit: 2021-09-25 | Discharge: 2021-09-25 | Discharge status: Against Medical Advice | Payer: Medicaid Other | Attending: Emergency Medicine | Admitting: Emergency Medicine

## 2021-09-25 ENCOUNTER — Encounter (HOSPITAL_COMMUNITY): Payer: Self-pay

## 2021-09-25 ENCOUNTER — Other Ambulatory Visit: Payer: Self-pay

## 2021-09-25 DIAGNOSIS — Z5321 Procedure and treatment not carried out due to patient leaving prior to being seen by health care provider: Secondary | ICD-10-CM | POA: Insufficient documentation

## 2021-09-25 DIAGNOSIS — K0889 Other specified disorders of teeth and supporting structures: Secondary | ICD-10-CM | POA: Diagnosis not present

## 2021-09-25 NOTE — ED Provider Triage Note (Signed)
Emergency Medicine Provider Triage Evaluation Note  [name] [name] , a 46 y.o. male  was evaluated in triage.  Pt complains of dental pain.  He is requesting pain pills and antibiotics.    Physical Exam  BP (!) 142/93   Pulse 89   Temp 98.1 F (36.7 C)   Resp 17   SpO2 99%  Gen:   Awake, no distress   Resp:  Normal effort  MSK:   Moves extremities without difficulty  Other:  Patient with mild gingival edema, sutures in place.  Normal phonation.  No trismus.   Medical Decision Making  Medically screening exam initiated at 5:33 PM.  Appropriate orders placed.  Gradie Ohm was informed that the remainder of the evaluation will be completed by another provider, this initial triage assessment does not replace that evaluation, and the importance of remaining in the ED until their evaluation is complete.  Patient denies fever.  When I directed to him to the waiting room he got upset stating "you don't understand I am banned and they try to get me out as soon as possible."  I explained to patient that there were other patients who had been waiting longer and attempted to direct him to the waiting room.  I removed my self from the room. Patient walked out stating he would go to urgent care and left.     Cristina Gong, [place] 09/25/21 1736

## 2021-09-25 NOTE — ED Triage Notes (Signed)
Pt c/o pain to his mouth for about 1 month. States his pain meds and abx have not helped.

## 2021-10-10 ENCOUNTER — Other Ambulatory Visit: Payer: Self-pay

## 2021-10-10 ENCOUNTER — Ambulatory Visit (INDEPENDENT_AMBULATORY_CARE_PROVIDER_SITE_OTHER): Payer: PRIVATE HEALTH INSURANCE | Admitting: Infectious Disease

## 2021-10-10 ENCOUNTER — Encounter: Payer: Self-pay | Admitting: Infectious Disease

## 2021-10-10 VITALS — BP 149/88 | HR 75 | Resp 16 | Ht 70.0 in | Wt 199.4 lb

## 2021-10-10 DIAGNOSIS — F209 Schizophrenia, unspecified: Secondary | ICD-10-CM

## 2021-10-10 DIAGNOSIS — F250 Schizoaffective disorder, bipolar type: Secondary | ICD-10-CM

## 2021-10-10 DIAGNOSIS — B20 Human immunodeficiency virus [HIV] disease: Secondary | ICD-10-CM

## 2021-10-10 DIAGNOSIS — A549 Gonococcal infection, unspecified: Secondary | ICD-10-CM | POA: Diagnosis not present

## 2021-10-10 MED ORDER — ATORVASTATIN CALCIUM 20 MG PO TABS: 20.0000 mg | ORAL_TABLET | Freq: Every day | ORAL | 11 refills | Status: DC

## 2021-10-10 MED ORDER — BIKTARVY 50-200-25 MG PO TABS: 1.0000 | ORAL_TABLET | Freq: Every day | ORAL | 11 refills | Status: DC
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
## 2021-10-10 NOTE — Addendum Note (Signed)
Addended by: Clayborne Artist A on: 10/10/2021 12:28 PM   Modules accepted: Orders

## 2021-10-11 ENCOUNTER — Other Ambulatory Visit (HOSPITAL_COMMUNITY): Payer: Self-pay

## 2021-10-11 LAB — T-HELPER CELLS (CD4) COUNT (NOT AT ARMC)
CD4 % Helper T Cell: 33 % (ref 33–65)
CD4 T Cell Abs: 323 /uL — ABNORMAL LOW (ref 400–1790)

## 2021-10-13 ENCOUNTER — Encounter (HOSPITAL_COMMUNITY): Payer: Self-pay

## 2021-10-13 ENCOUNTER — Other Ambulatory Visit: Payer: Self-pay

## 2021-10-13 ENCOUNTER — Emergency Department (HOSPITAL_COMMUNITY)
Admission: EM | Admit: 2021-10-13 | Discharge: 2021-10-13 | Disposition: A | Discharge status: Home / Self Care | Payer: Medicaid Other | Attending: Emergency Medicine | Admitting: Emergency Medicine

## 2021-10-13 DIAGNOSIS — G479 Sleep disorder, unspecified: Secondary | ICD-10-CM | POA: Diagnosis present

## 2021-10-13 LAB — CBC
HCT: 45.9 % (ref 39.0–52.0)
Hemoglobin: 15 g/dL (ref 13.0–17.0)
MCH: 31.8 pg (ref 26.0–34.0)
MCHC: 32.7 g/dL (ref 30.0–36.0)
MCV: 97.5 fL (ref 80.0–100.0)
Platelets: 192 10*3/uL (ref 150–400)
RBC: 4.71 MIL/uL (ref 4.22–5.81)
RDW: 11.5 % (ref 11.5–15.5)
WBC: 5.8 10*3/uL (ref 4.0–10.5)
nRBC: 0 % (ref 0.0–0.2)

## 2021-10-13 LAB — COMPREHENSIVE METABOLIC PANEL
ALT: 19 U/L (ref 0–44)
AST: 18 U/L (ref 15–41)
Albumin: 4.3 g/dL (ref 3.5–5.0)
Alkaline Phosphatase: 97 U/L (ref 38–126)
Anion gap: 4 — ABNORMAL LOW (ref 5–15)
BUN: 15 mg/dL (ref 6–20)
CO2: 24 mmol/L (ref 22–32)
Calcium: 10 mg/dL (ref 8.9–10.3)
Chloride: 114 mmol/L — ABNORMAL HIGH (ref 98–111)
Creatinine, Ser: 1.33 mg/dL — ABNORMAL HIGH (ref 0.61–1.24)
GFR, Estimated: >60 mL/min (ref >60)
Glucose, Bld: 86 mg/dL (ref 70–99)
Potassium: 3.9 mmol/L (ref 3.5–5.1)
Sodium: 142 mmol/L (ref 135–145)
Total Bilirubin: 0.8 mg/dL (ref 0.3–1.2)
Total Protein: 7.3 g/dL (ref 6.5–8.1)

## 2021-10-13 LAB — LIPID PANEL
Cholesterol: 172 mg/dL (ref <200)
HDL: 43 mg/dL (ref >=40)
LDL Cholesterol (Calc): 111 mg/dL (calc) — ABNORMAL HIGH
Non-HDL Cholesterol (Calc): 129 mg/dL (calc) (ref <130)
Total CHOL/HDL Ratio: 4 (calc) (ref <5.0)
Triglycerides: 89 mg/dL (ref <150)

## 2021-10-13 LAB — CBC WITH DIFFERENTIAL/PLATELET
Absolute Monocytes: 403 cells/uL (ref 200–950)
Basophils Absolute: 19 cells/uL (ref 0–200)
Basophils Relative: 0.4 %
Eosinophils Absolute: 110 cells/uL (ref 15–500)
Eosinophils Relative: 2.3 %
HCT: 43.5 % (ref 38.5–50.0)
Hemoglobin: 14.8 g/dL (ref 13.2–17.1)
Lymphs Abs: 1075 cells/uL (ref 850–3900)
MCH: 32.2 pg (ref 27.0–33.0)
MCHC: 34 g/dL (ref 32.0–36.0)
MCV: 94.8 fL (ref 80.0–100.0)
MPV: 10.9 fL (ref 7.5–12.5)
Monocytes Relative: 8.4 %
Neutro Abs: 3192 cells/uL (ref 1500–7800)
Neutrophils Relative %: 66.5 %
Platelets: 192 10*3/uL (ref 140–400)
RBC: 4.59 10*6/uL (ref 4.20–5.80)
RDW: 11.4 % (ref 11.0–15.0)
Total Lymphocyte: 22.4 %
WBC: 4.8 10*3/uL (ref 3.8–10.8)

## 2021-10-13 LAB — COMPLETE METABOLIC PANEL WITH GFR
AG Ratio: 1.7 (calc) (ref 1.0–2.5)
ALT: 15 U/L (ref 9–46)
AST: 14 U/L (ref 10–40)
Albumin: 4.1 g/dL (ref 3.6–5.1)
Alkaline phosphatase (APISO): 95 U/L (ref 36–130)
BUN: 18 mg/dL (ref 7–25)
CO2: 23 mmol/L (ref 20–32)
Calcium: 10.1 mg/dL (ref 8.6–10.3)
Chloride: 113 mmol/L — ABNORMAL HIGH (ref 98–110)
Creat: 1.17 mg/dL (ref 0.60–1.29)
Globulin: 2.4 g/dL (calc) (ref 1.9–3.7)
Glucose, Bld: 114 mg/dL — ABNORMAL HIGH (ref 65–99)
Potassium: 4 mmol/L (ref 3.5–5.3)
Sodium: 143 mmol/L (ref 135–146)
Total Bilirubin: 0.5 mg/dL (ref 0.2–1.2)
Total Protein: 6.5 g/dL (ref 6.1–8.1)
eGFR: 78 mL/min/{1.73_m2} (ref >=60)

## 2021-10-13 LAB — SALICYLATE LEVEL: Salicylate Lvl: <7 mg/dL — ABNORMAL LOW (ref 7.0–30.0)

## 2021-10-13 LAB — ETHANOL: Alcohol, Ethyl (B): <10 mg/dL (ref <10)

## 2021-10-13 LAB — HIV-1 RNA QUANT-NO REFLEX-BLD
HIV 1 RNA Quant: NOT DETECTED Copies/mL
HIV-1 RNA Quant, Log: NOT DETECTED Log cps/mL

## 2021-10-13 LAB — RPR: RPR Ser Ql: NONREACTIVE

## 2021-10-13 LAB — ACETAMINOPHEN LEVEL: Acetaminophen (Tylenol), Serum: <10 ug/mL — ABNORMAL LOW (ref 10–30)

## 2021-10-13 MED ORDER — HYDROXYZINE HCL 25 MG PO TABS: 25.0000 mg | ORAL_TABLET | Freq: Four times a day (QID) | ORAL | 0 refills | Status: AC

## 2021-10-13 MED ORDER — LORAZEPAM 1 MG PO TABS
1.0000 mg | ORAL_TABLET | Freq: Once | ORAL | Status: AC
  Administered 2021-10-13: 1 mg via ORAL
  Filled 2021-10-13: qty 1

## 2021-10-13 MED ORDER — HYDROXYZINE HCL 25 MG PO TABS: 25.0000 mg | ORAL_TABLET | Freq: Four times a day (QID) | ORAL | 0 refills | Status: DC

## 2021-10-13 NOTE — ED Notes (Signed)
Confirmed with Clinic [facility]  ( Dr Orlan Leavens ) pt received his last shot on 09/23/21 and is due the next short on 8/29 at 3 pm. For his [place] 234mg  .

## 2021-10-13 NOTE — Discharge Instructions (Addendum)
Make sure that you are drinking plenty of water.  Use the hydroxyzine as prescribed as needed.  Chest pain please follow-up with your psychiatrist has been admitted once in your primary care provider.
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
## 2021-10-13 NOTE — ED Provider Notes (Signed)
[facility]-EMERGENCY DEPT Provider Note   CSN: [id number] Arrival date & time: 10/13/21  1424     History  Chief Complaint  Patient presents with   Psychiatric Evaluation    [name] [name] is a 46 y.o. male.  HPI  Patient is a 46 year old male presented emergency room today with complaints of needing something for sleep.  It seems that he told place department that he had SI/HI earlier in order to get a ride to the emergency department.  He states he is not homicidal, suicidal or hallucinating.  He states he is having vivid dreams.  He states that it wakes him up from sleep regularly.  No other associate symptoms.    Home Medications Prior to Admission medications   Medication Sig Start Date End Date Taking? Authorizing Provider  atorvastatin (LIPITOR) 20 MG tablet Take 1 tablet (20 mg total) by mouth daily. 10/10/21  Yes Daiva Eves, Lisette Grinder, MD  BENADRYL ALLERGY 25 MG capsule Take 25-50 mg by mouth every 6 (six) hours as needed for allergies or itching.   Yes [provider]  benztropine (COGENTIN) 2 MG tablet Take 2 mg by mouth at bedtime. 04/26/20  Yes [provider]  bictegravir-emtricitabine-tenofovir AF (BIKTARVY) 50-200-25 MG TABS tablet Take 1 tablet by mouth daily. Patient taking differently: Take 1 tablet by mouth at bedtime. 10/10/21  Yes Daiva Eves, Lisette Grinder, MD  chlorproMAZINE (THORAZINE) 200 MG tablet Take 200 mg by mouth at bedtime.   Yes [provider]  haloperidol (HALDOL) 20 MG tablet Take 20 mg by mouth at bedtime. 04/26/20  Yes [provider]  hydrOXYzine (ATARAX) 25 MG tablet Take 1 tablet (25 mg total) by mouth every 6 (six) hours for 14 days. 10/13/21 10/27/21 Yes Colie Josten S, PA  INVEGA SUSTENNA 234 MG/1.5ML injection Inject 234 mg into the muscle once.   Yes [provider]  lithium carbonate 300 MG capsule Take 900 mg by mouth at bedtime as needed and may repeat dose one time if needed.  10/12/21  Yes [provider]  penicillin v potassium (VEETID) 500 MG tablet Take 500 mg by mouth at bedtime. 09/13/21  Yes [provider]  VISINE-AC 0.05-0.25 % ophthalmic solution Place 2 drops into both eyes 3 (three) times daily as needed (for irritation).   Yes [provider]  levETIRAcetam (KEPPRA) 500 MG tablet Take 1 tablet (500 mg total) by mouth 2 (two) times daily. Patient not taking: Reported on 10/13/2021 04/15/20   Daiva Eves, Lisette Grinder, MD  valACYclovir (VALTREX) 500 MG tablet Take 1 tablet (500 mg total) by mouth daily. Patient not taking: Reported on 10/13/2021 05/17/21   Daiva Eves, Lisette Grinder, MD      Allergies    Prunus persica, Sudafed [pseudoephedrine], and Other    Review of Systems   Review of Systems  Physical Exam Updated Vital Signs BP (!) 141/95 (BP Location: Left Arm)   Pulse (!) 110   Temp 98.2 F (36.8 C) (Oral)   Resp 20   Ht 5\' 10"  (1.778 m)   Wt 90.3 kg   SpO2 97%   BMI 28.55 kg/m  Physical Exam Vitals and nursing note reviewed.  Constitutional:      General: He is not in acute distress. HENT:     Head: Normocephalic and atraumatic.     Nose: Nose normal.     Mouth/Throat:     Mouth: Mucous membranes are moist.  Eyes:     General:  No scleral icterus. Cardiovascular:     Rate and Rhythm: Normal rate and regular rhythm.     Pulses: Normal pulses.     Heart sounds: Normal heart sounds.  Pulmonary:     Effort: Pulmonary effort is normal. No respiratory distress.     Breath sounds: No wheezing.  Abdominal:     Palpations: Abdomen is soft.     Tenderness: There is no abdominal tenderness.  Musculoskeletal:     Cervical back: Normal range of motion.     Right lower leg: No edema.     Left lower leg: No edema.  Skin:    General: Skin is warm and dry.     Capillary Refill: Capillary refill takes less than 2 seconds.  Neurological:     Mental Status: He is alert. Mental status is at baseline.  Psychiatric:         Mood and Affect: Mood normal.        Behavior: Behavior normal.    ED Results / Procedures / Treatments   Labs (all labs ordered are listed, but only abnormal results are displayed) Labs Reviewed  SALICYLATE LEVEL - Abnormal; Notable for the following components:      Result Value   Salicylate Lvl <7.0 (*)    All other components within normal limits  ACETAMINOPHEN LEVEL - Abnormal; Notable for the following components:   Acetaminophen (Tylenol), Serum <10 (*)    All other components within normal limits  ETHANOL  CBC  COMPREHENSIVE METABOLIC PANEL  RAPID URINE DRUG SCREEN, HOSP PERFORMED    EKG None  Radiology No results found.  Procedures Procedures    Medications Ordered in ED Medications  LORazepam (ATIVAN) tablet 1 mg (1 mg Oral Given 10/13/21 1603)    ED Course/ Medical Decision Making/ A&P                           Medical Decision Making Amount and/or Complexity of Data Reviewed Labs: ordered.  Risk Prescription drug management.   Patient is a 46 year old male presented emergency room today with complaints of needing something for sleep.  It seems that he told place department that he had SI/HI earlier in order to get a ride to the emergency department.  He states he is not homicidal, suicidal or hallucinating.  He states he is having vivid dreams.  He states that it wakes him up from sleep regularly.  No other associate symptoms.  Labs were ordered in triage.  CMP notable for marginal elevation in creatinine.  I recommended that he hydrate.  No AKI present.  CBC unremarkable Tylenol salicylate and ethanol levels undetectable.  EKG nonischemic.  Will discharge home at this time in accordance with his requests.  He is not suicidal, homicidal, or hallucinating.  Atarax for sleep.  Return precautions discussed.  Follow-up with psychiatry.  Given 1 dose of Ativan     Final Clinical Impression(s) / ED Diagnoses Final diagnoses:  Sleep disturbance    Rx  / DC Orders ED Discharge Orders          Ordered    hydrOXYzine (ATARAX) 25 MG tablet  Every 6 hours        10/13/21 1555              Solon Augusta Blaine, [place] 10/13/21 1806    Benjiman Core, MD 10/14/21 [phone]

## 2021-10-13 NOTE — ED Notes (Signed)
Patient refused discharge vitals at this time.

## 2021-10-17 ENCOUNTER — Emergency Department (HOSPITAL_COMMUNITY)
Admission: EM | Admit: 2021-10-17 | Discharge: 2021-10-17 | Disposition: A | Discharge status: Home / Self Care | Payer: Medicaid Other | Attending: Emergency Medicine | Admitting: Emergency Medicine

## 2021-10-17 ENCOUNTER — Emergency Department (HOSPITAL_COMMUNITY)
Admission: EM | Admit: 2021-10-17 | Discharge: 2021-10-17 | Discharge status: Absent without leave | Payer: Medicaid Other

## 2021-10-17 ENCOUNTER — Encounter (HOSPITAL_COMMUNITY): Payer: Self-pay

## 2021-10-17 ENCOUNTER — Other Ambulatory Visit: Payer: Self-pay

## 2021-10-17 DIAGNOSIS — Z760 Encounter for issue of repeat prescription: Secondary | ICD-10-CM | POA: Insufficient documentation

## 2021-10-17 DIAGNOSIS — Z5321 Procedure and treatment not carried out due to patient leaving prior to being seen by health care provider: Secondary | ICD-10-CM | POA: Diagnosis not present

## 2021-10-17 NOTE — ED Triage Notes (Signed)
Denies SI/HI here for shot of ativan or geodon per patient.  Gets invega tomorrow.
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
## 2021-10-31 ENCOUNTER — Other Ambulatory Visit: Payer: Self-pay | Admitting: Internal Medicine

## 2021-11-01 LAB — LIPID PANEL
Cholesterol: 145 mg/dL (ref <200)
HDL: 33 mg/dL — ABNORMAL LOW (ref >=40)
LDL Cholesterol (Calc): 83 mg/dL (calc)
Non-HDL Cholesterol (Calc): 112 mg/dL (calc) (ref <130)
Total CHOL/HDL Ratio: 4.4 (calc) (ref <5.0)
Triglycerides: 197 mg/dL — ABNORMAL HIGH (ref <150)

## 2021-11-01 LAB — CBC
HCT: 43.9 % (ref 38.5–50.0)
Hemoglobin: 15.2 g/dL (ref 13.2–17.1)
MCH: 32.1 pg (ref 27.0–33.0)
MCHC: 34.6 g/dL (ref 32.0–36.0)
MCV: 92.6 fL (ref 80.0–100.0)
MPV: 11.3 fL (ref 7.5–12.5)
Platelets: 203 10*3/uL (ref 140–400)
RBC: 4.74 10*6/uL (ref 4.20–5.80)
RDW: 11 % (ref 11.0–15.0)
WBC: 7 10*3/uL (ref 3.8–10.8)

## 2021-11-01 LAB — VITAMIN D 25 HYDROXY (VIT D DEFICIENCY, FRACTURES): Vit D, 25-Hydroxy: 43 ng/mL (ref 30–100)

## 2021-11-01 LAB — COMPLETE METABOLIC PANEL WITH GFR
AG Ratio: 1.4 (calc) (ref 1.0–2.5)
ALT: 12 U/L (ref 9–46)
AST: 14 U/L (ref 10–40)
Albumin: 4.2 g/dL (ref 3.6–5.1)
Alkaline phosphatase (APISO): 96 U/L (ref 36–130)
BUN: 9 mg/dL (ref 7–25)
CO2: 24 mmol/L (ref 20–32)
Calcium: 9.7 mg/dL (ref 8.6–10.3)
Chloride: 108 mmol/L (ref 98–110)
Creat: 1.2 mg/dL (ref 0.60–1.29)
Globulin: 3.1 g/dL (calc) (ref 1.9–3.7)
Glucose, Bld: 110 mg/dL — ABNORMAL HIGH (ref 65–99)
Potassium: 3.6 mmol/L (ref 3.5–5.3)
Sodium: 144 mmol/L (ref 135–146)
Total Bilirubin: 0.6 mg/dL (ref 0.2–1.2)
Total Protein: 7.3 g/dL (ref 6.1–8.1)
eGFR: 76 mL/min/{1.73_m2} (ref >=60)

## 2021-11-01 LAB — PSA: PSA: 0.81 ng/mL (ref <=4.00)

## 2021-11-01 LAB — TSH: TSH: 1.62 mIU/L (ref 0.40–4.50)

## 2021-11-03 ENCOUNTER — Emergency Department (HOSPITAL_COMMUNITY): Payer: PRIVATE HEALTH INSURANCE

## 2021-11-03 ENCOUNTER — Emergency Department (HOSPITAL_COMMUNITY)
Admission: EM | Admit: 2021-11-03 | Discharge: 2021-11-04 | Disposition: A | Discharge status: Home / Self Care | Payer: PRIVATE HEALTH INSURANCE | Attending: Emergency Medicine | Admitting: Emergency Medicine

## 2021-11-03 DIAGNOSIS — Z20822 Contact with and (suspected) exposure to covid-19: Secondary | ICD-10-CM | POA: Insufficient documentation

## 2021-11-03 DIAGNOSIS — Z79899 Other long term (current) drug therapy: Secondary | ICD-10-CM | POA: Insufficient documentation

## 2021-11-03 DIAGNOSIS — B20 Human immunodeficiency virus [HIV] disease: Secondary | ICD-10-CM

## 2021-11-03 DIAGNOSIS — I10 Essential (primary) hypertension: Secondary | ICD-10-CM | POA: Diagnosis not present

## 2021-11-03 DIAGNOSIS — R5383 Other fatigue: Secondary | ICD-10-CM | POA: Insufficient documentation

## 2021-11-03 DIAGNOSIS — R030 Elevated blood-pressure reading, without diagnosis of hypertension: Secondary | ICD-10-CM | POA: Diagnosis present

## 2021-11-03 DIAGNOSIS — E876 Hypokalemia: Secondary | ICD-10-CM | POA: Diagnosis not present

## 2021-11-03 DIAGNOSIS — R5381 Other malaise: Secondary | ICD-10-CM | POA: Diagnosis not present

## 2021-11-03 LAB — CBC WITH DIFFERENTIAL/PLATELET
Abs Immature Granulocytes: 0.01 10*3/uL (ref 0.00–0.07)
Basophils Absolute: 0 10*3/uL (ref 0.0–0.1)
Basophils Relative: 1 %
Eosinophils Absolute: 0.1 10*3/uL (ref 0.0–0.5)
Eosinophils Relative: 2 %
HCT: 43.5 % (ref 39.0–52.0)
Hemoglobin: 14.6 g/dL (ref 13.0–17.0)
Immature Granulocytes: 0 %
Lymphocytes Relative: 24 %
Lymphs Abs: 1.4 10*3/uL (ref 0.7–4.0)
MCH: 32.3 pg (ref 26.0–34.0)
MCHC: 33.6 g/dL (ref 30.0–36.0)
MCV: 96.2 fL (ref 80.0–100.0)
Monocytes Absolute: 0.6 10*3/uL (ref 0.1–1.0)
Monocytes Relative: 11 %
Neutro Abs: 3.6 10*3/uL (ref 1.7–7.7)
Neutrophils Relative %: 62 %
Platelets: 177 10*3/uL (ref 150–400)
RBC: 4.52 MIL/uL (ref 4.22–5.81)
RDW: 11 % — ABNORMAL LOW (ref 11.5–15.5)
WBC: 5.8 10*3/uL (ref 4.0–10.5)
nRBC: 0 % (ref 0.0–0.2)

## 2021-11-03 LAB — COMPREHENSIVE METABOLIC PANEL
ALT: 14 U/L (ref 0–44)
AST: 16 U/L (ref 15–41)
Albumin: 3.8 g/dL (ref 3.5–5.0)
Alkaline Phosphatase: 81 U/L (ref 38–126)
Anion gap: 3 — ABNORMAL LOW (ref 5–15)
BUN: 8 mg/dL (ref 6–20)
CO2: 28 mmol/L (ref 22–32)
Calcium: 9.6 mg/dL (ref 8.9–10.3)
Chloride: 111 mmol/L (ref 98–111)
Creatinine, Ser: 1.22 mg/dL (ref 0.61–1.24)
GFR, Estimated: >60 mL/min (ref >60)
Glucose, Bld: 93 mg/dL (ref 70–99)
Potassium: 3.1 mmol/L — ABNORMAL LOW (ref 3.5–5.1)
Sodium: 142 mmol/L (ref 135–145)
Total Bilirubin: 1 mg/dL (ref 0.3–1.2)
Total Protein: 6.6 g/dL (ref 6.5–8.1)

## 2021-11-03 LAB — RESP PANEL BY RT-PCR (FLU A&B, COVID) ARPGX2
Influenza A by PCR: NEGATIVE
Influenza B by PCR: NEGATIVE
SARS Coronavirus 2 by RT PCR: NEGATIVE

## 2021-11-03 LAB — RAPID URINE DRUG SCREEN, HOSP PERFORMED
Amphetamines: NOT DETECTED
Barbiturates: NOT DETECTED
Benzodiazepines: NOT DETECTED
Cocaine: NOT DETECTED
Opiates: NOT DETECTED
Tetrahydrocannabinol: NOT DETECTED

## 2021-11-03 LAB — URINALYSIS, ROUTINE W REFLEX MICROSCOPIC
Bilirubin Urine: NEGATIVE
Glucose, UA: NEGATIVE mg/dL
Hgb urine dipstick: NEGATIVE
Ketones, ur: NEGATIVE mg/dL
Leukocytes,Ua: NEGATIVE
Nitrite: NEGATIVE
Protein, ur: NEGATIVE mg/dL
Specific Gravity, Urine: 1.006 (ref 1.005–1.030)
pH: 6 (ref 5.0–8.0)

## 2021-11-03 LAB — TROPONIN I (HIGH SENSITIVITY): Troponin I (High Sensitivity): 3 ng/L (ref <18)

## 2021-11-03 LAB — ETHANOL: Alcohol, Ethyl (B): <10 mg/dL (ref <10)

## 2021-11-03 LAB — LIPASE, BLOOD: Lipase: 28 U/L (ref 11–51)

## 2021-11-03 MED ORDER — AMLODIPINE BESYLATE 5 MG PO TABS: 5.0000 mg | ORAL_TABLET | Freq: Every day | ORAL | 0 refills | Status: DC

## 2021-11-03 MED ORDER — AMLODIPINE BESYLATE 5 MG PO TABS
5.0000 mg | ORAL_TABLET | Freq: Once | ORAL | Status: AC
  Administered 2021-11-03: 5 mg via ORAL
  Filled 2021-11-03: qty 1

## 2021-11-03 MED ORDER — POTASSIUM CHLORIDE CRYS ER 20 MEQ PO TBCR
40.0000 meq | EXTENDED_RELEASE_TABLET | Freq: Once | ORAL | Status: AC
  Administered 2021-11-03: 40 meq via ORAL
  Filled 2021-11-03: qty 2

## 2021-11-03 NOTE — ED Notes (Signed)
Pt has been resting in bed calmly since arrival

## 2021-11-03 NOTE — ED Provider Notes (Signed)
[facility]-EMERGENCY DEPT Provider Note   CSN: [id number] Arrival date & time: 11/03/21  2118     History  Chief Complaint  Patient presents with   Hypertension    [name] [name] is a 46 y.o. male.  HPI Patient reports that he has been working night shifts and also during the day.  He is not getting much sleep.  He reports that he has been missing his medications for about the past week.  He reports he is concerned about his high blood pressure.  He reports he was taking amlodipine but he has not had it in about a week.  He also reports he just feels generally really bad all over.  He reports he feels generally weak and like is hard to even get up.  No documented fever.  He endorses chest pain but nonspecific.  He denies he has had frequent or recent cough.  No pain burning urgency with urination.  No lower extremity swelling.    Home Medications Prior to Admission medications   Medication Sig Start Date End Date Taking? Authorizing Provider  atorvastatin (LIPITOR) 20 MG tablet Take 1 tablet (20 mg total) by mouth daily. 10/10/21   Randall Hiss, MD  BENADRYL ALLERGY 25 MG capsule Take 25-50 mg by mouth every 6 (six) hours as needed for allergies or itching.    [provider]  benztropine (COGENTIN) 2 MG tablet Take 2 mg by mouth at bedtime. 04/26/20   [provider]  bictegravir-emtricitabine-tenofovir AF (BIKTARVY) 50-200-25 MG TABS tablet Take 1 tablet by mouth daily. Patient taking differently: Take 1 tablet by mouth at bedtime. 10/10/21   Randall Hiss, MD  chlorproMAZINE (THORAZINE) 200 MG tablet Take 200 mg by mouth at bedtime.    [provider]  haloperidol (HALDOL) 20 MG tablet Take 20 mg by mouth at bedtime. 04/26/20   [provider]  INVEGA SUSTENNA 234 MG/1.5ML injection Inject 234 mg into the muscle once.    [provider]  levETIRAcetam (KEPPRA) 500 MG tablet Take 1 tablet (500 mg total) by  mouth 2 (two) times daily. Patient not taking: Reported on 10/13/2021 04/15/20   Daiva Eves, Lisette Grinder, MD  lithium carbonate 300 MG capsule Take 900 mg by mouth at bedtime as needed and may repeat dose one time if needed. 10/12/21   [provider]  penicillin v potassium (VEETID) 500 MG tablet Take 500 mg by mouth at bedtime. 09/13/21   [provider]  valACYclovir (VALTREX) 500 MG tablet Take 1 tablet (500 mg total) by mouth daily. Patient not taking: Reported on 10/13/2021 05/17/21   Daiva Eves, Lisette Grinder, MD  VISINE-AC 0.05-0.25 % ophthalmic solution Place 2 drops into both eyes 3 (three) times daily as needed (for irritation).    [provider]      Allergies    Prunus persica, Sudafed [pseudoephedrine], and Other    Review of Systems   Review of Systems  Physical Exam Updated Vital Signs BP (!) 149/102   Pulse 85   Temp 98.7 F (37.1 C) (Oral)   Resp 16   Ht 5\' 10"  (1.778 m)   SpO2 97%   BMI 28.55 kg/m  Physical Exam Constitutional:      Comments: Patient is alert.  No confusion.  No respiratory distress at rest.  HENT:     Mouth/Throat:     Mouth: Mucous membranes are moist.     Pharynx: Oropharynx is clear.  Comments: Edentulous. Eyes:     Comments: Extraocular motions intact.  Sclera are injected bilaterally.  Cardiovascular:     Rate and Rhythm: Normal rate and regular rhythm.  Pulmonary:     Effort: Pulmonary effort is normal.     Breath sounds: Normal breath sounds.  Abdominal:     General: There is no distension.     Palpations: Abdomen is soft.     Tenderness: There is no abdominal tenderness. There is no guarding.  Musculoskeletal:        General: No swelling or tenderness. Normal range of motion.     Cervical back: Neck supple.     Right lower leg: No edema.     Left lower leg: No edema.  Skin:    General: Skin is warm and dry.  Neurological:     General: No focal deficit present.     Mental Status: He is oriented to  person, place, and time.     Motor: No weakness.     Coordination: Coordination normal.  Psychiatric:        Mood and Affect: Mood normal.     ED Results / Procedures / Treatments   Labs (all labs ordered are listed, but only abnormal results are displayed) Labs Reviewed  COMPREHENSIVE METABOLIC PANEL - Abnormal; Notable for the following components:      Result Value   Potassium 3.1 (*)    Anion gap 3 (*)    All other components within normal limits  CBC WITH DIFFERENTIAL/PLATELET - Abnormal; Notable for the following components:   RDW 11.0 (*)    All other components within normal limits  RESP PANEL BY RT-PCR (FLU A&B, COVID) ARPGX2  ETHANOL  LIPASE, BLOOD  URINALYSIS, ROUTINE W REFLEX MICROSCOPIC  RAPID URINE DRUG SCREEN, HOSP PERFORMED  LEVETIRACETAM LEVEL  LITHIUM LEVEL  TROPONIN I (HIGH SENSITIVITY)  TROPONIN I (HIGH SENSITIVITY)    EKG EKG Interpretation  Date/Time:  Thursday November 03 2021 22:40:38 EDT Ventricular Rate:  89 PR Interval:  190 QRS Duration: 92 QT Interval:  352 QTC Calculation: 429 R Axis:   31 Text Interpretation: Sinus rhythm Anteroseptal infarct, age indeterminate no sig change from previous Confirmed by Arby Barrette [phone]) on 11/03/2021 11:12:26 PM  Radiology DG Chest Port 1 View  Result Date: 11/03/2021 CLINICAL DATA:  Psych eval. EXAM: PORTABLE CHEST 1 VIEW COMPARISON:  October 07, 2018 FINDINGS: The heart size and mediastinal contours are within normal limits. Low lung volumes are noted with mild elevation of the right hemidiaphragm. Both lungs are clear. The visualized skeletal structures are unremarkable. IMPRESSION: No active disease. Electronically Signed   By: Aram Candela M.D.   On: 11/03/2021 23:07    Procedures Procedures    Medications Ordered in ED Medications  amLODipine (NORVASC) tablet 5 mg (5 mg Oral Given 11/03/21 2352)  potassium chloride SA (KLOR-CON M) CR tablet 40 mEq (40 mEq Oral Given 11/03/21 2352)     ED Course/ Medical Decision Making/ A&P                           Medical Decision Making Amount and/or Complexity of Data Reviewed Labs: ordered. Radiology: ordered.  Risk Prescription drug management.   Patient describes general malaise.  He does not focus on specific symptoms but reports feeling generally unwell and fatigued.  He also reports concern about his blood pressure because he has missed his medication for about a week.  Patient reports  has been noncompliant with medications because he has been working a lot.  He also describes missing a lot of sleep.  At this time patient clinically is nontoxic.  Vital signs are stable with moderate hypertension in the 140s over 100s off of medication.  At this time we will proceed with general diagnostic evaluation with lab work, EKG, chest x-ray and COVID testing.  Administer amlodipine 5 mg.  Potassium is slightly low at 3.1.  We will replace with 40 mEq p.o.  Diagnostic studies within normal limits.  Troponin is normal, aside from mild hypokalemia, electrolyte panel within normal limits.  CBC within normal limits.  Urinalysis no signs of infection.  Chest x-ray reviewed by radiology no acute findings.  At this time, patient clinically does not appear significantly ill.  He has general constitutional symptoms of fatigue and malaise.  He reports noncompliance with his medications for about a week.  At this time will recommend resuming his regular medications.  He says he has not run out but he just not been taking them because he has been working a lot.  I recommend close follow-up with his PCP and monitoring his HIV although at this time he does not appear to be decompensated anyway.  We will prescribe for some potassium supplement for the next couple of days.  Otherwise stable for discharge with follow-up.        Final Clinical Impression(s) / ED Diagnoses Final diagnoses:  Essential hypertension  Malaise and fatigue  Human  immunodeficiency virus (HIV) disease (HCC)    Rx / DC Orders ED Discharge Orders     None         Arby Barrette, MD 11/03/21 2354

## 2021-11-03 NOTE — ED Notes (Signed)
Per pt's request, mother notified pt in ED by nurse tech

## 2021-11-03 NOTE — ED Triage Notes (Signed)
PT reports he has not been taking his BP meds due to "working". PT appears to be in no distress. Resting in bed with eyes closed. Opens for RN when talking to her and assisted with taking his temperature.

## 2021-11-03 NOTE — ED Notes (Signed)
Pt reports he is urinating blood. Urinal given and requested sample be provided asap

## 2021-11-03 NOTE — Discharge Instructions (Signed)
1.  Your prescription for amlodipine has been refilled.  You have had a dose in the emergency department.  Resume taking your medication tomorrow. 2.  Schedule follow-up appointment with your doctor as soon as possible. 3.  At this time your tests in the emergency department are essentially normal.  Your significant fatigue and weakness may be due to lack of sleep and increased physical activity recently.  Try to get rest and stay well-hydrated.  Resume all your regularly prescribed medications.  Follow-up with your doctor soon as possible.

## 2021-11-04 LAB — LITHIUM LEVEL: Lithium Lvl: 0.11 mmol/L — ABNORMAL LOW (ref 0.60–1.20)

## 2021-11-04 MED ORDER — POTASSIUM CHLORIDE CRYS ER 20 MEQ PO TBCR: 20.0000 meq | EXTENDED_RELEASE_TABLET | Freq: Two times a day (BID) | ORAL | 0 refills | Status: DC

## 2021-11-04 NOTE — ED Notes (Signed)
RN called [employer] non emergent to send officers for transport.

## 2021-11-04 NOTE — ED Notes (Signed)
[employer] on scene to transport.

## 2021-11-08 ENCOUNTER — Encounter (HOSPITAL_COMMUNITY): Payer: Self-pay | Admitting: Emergency Medicine

## 2021-11-08 ENCOUNTER — Encounter (HOSPITAL_COMMUNITY): Payer: Self-pay

## 2021-11-08 ENCOUNTER — Other Ambulatory Visit: Payer: Self-pay

## 2021-11-08 ENCOUNTER — Emergency Department (HOSPITAL_COMMUNITY)
Admission: EM | Admit: 2021-11-08 | Discharge: 2021-11-08 | Disposition: A | Discharge status: Home / Self Care | Payer: PRIVATE HEALTH INSURANCE | Attending: Emergency Medicine | Admitting: Emergency Medicine

## 2021-11-08 ENCOUNTER — Emergency Department (HOSPITAL_COMMUNITY): Payer: Medicaid Other

## 2021-11-08 ENCOUNTER — Emergency Department (HOSPITAL_COMMUNITY): Payer: PRIVATE HEALTH INSURANCE

## 2021-11-08 ENCOUNTER — Emergency Department (HOSPITAL_COMMUNITY)
Admission: EM | Admit: 2021-11-08 | Discharge: 2021-11-08 | Disposition: A | Discharge status: Home / Self Care | Payer: Medicaid Other | Attending: Emergency Medicine | Admitting: Emergency Medicine

## 2021-11-08 DIAGNOSIS — R1031 Right lower quadrant pain: Secondary | ICD-10-CM | POA: Diagnosis present

## 2021-11-08 DIAGNOSIS — N5089 Other specified disorders of the male genital organs: Secondary | ICD-10-CM | POA: Insufficient documentation

## 2021-11-08 DIAGNOSIS — R590 Localized enlarged lymph nodes: Secondary | ICD-10-CM | POA: Insufficient documentation

## 2021-11-08 DIAGNOSIS — Z79899 Other long term (current) drug therapy: Secondary | ICD-10-CM | POA: Diagnosis not present

## 2021-11-08 DIAGNOSIS — R1030 Lower abdominal pain, unspecified: Secondary | ICD-10-CM

## 2021-11-08 DIAGNOSIS — Z21 Asymptomatic human immunodeficiency virus [HIV] infection status: Secondary | ICD-10-CM | POA: Insufficient documentation

## 2021-11-08 DIAGNOSIS — R519 Headache, unspecified: Secondary | ICD-10-CM | POA: Insufficient documentation

## 2021-11-08 LAB — CBC WITH DIFFERENTIAL/PLATELET
Abs Immature Granulocytes: 0.01 10*3/uL (ref 0.00–0.07)
Basophils Absolute: 0 10*3/uL (ref 0.0–0.1)
Basophils Relative: 0 %
Eosinophils Absolute: 0.1 10*3/uL (ref 0.0–0.5)
Eosinophils Relative: 1 %
HCT: 45.6 % (ref 39.0–52.0)
Hemoglobin: 15.1 g/dL (ref 13.0–17.0)
Immature Granulocytes: 0 %
Lymphocytes Relative: 20 %
Lymphs Abs: 1.2 10*3/uL (ref 0.7–4.0)
MCH: 32.1 pg (ref 26.0–34.0)
MCHC: 33.1 g/dL (ref 30.0–36.0)
MCV: 96.8 fL (ref 80.0–100.0)
Monocytes Absolute: 0.6 10*3/uL (ref 0.1–1.0)
Monocytes Relative: 10 %
Neutro Abs: 4.2 10*3/uL (ref 1.7–7.7)
Neutrophils Relative %: 69 %
Platelets: 175 10*3/uL (ref 150–400)
RBC: 4.71 MIL/uL (ref 4.22–5.81)
RDW: 11.2 % — ABNORMAL LOW (ref 11.5–15.5)
WBC: 6.1 10*3/uL (ref 4.0–10.5)
nRBC: 0 % (ref 0.0–0.2)

## 2021-11-08 LAB — URINALYSIS, ROUTINE W REFLEX MICROSCOPIC
Bilirubin Urine: NEGATIVE
Glucose, UA: NEGATIVE mg/dL
Hgb urine dipstick: NEGATIVE
Ketones, ur: NEGATIVE mg/dL
Leukocytes,Ua: NEGATIVE
Nitrite: NEGATIVE
Protein, ur: NEGATIVE mg/dL
Specific Gravity, Urine: 1.01 (ref 1.005–1.030)
pH: 7 (ref 5.0–8.0)

## 2021-11-08 LAB — BASIC METABOLIC PANEL
Anion gap: 7 (ref 5–15)
BUN: 8 mg/dL (ref 6–20)
CO2: 25 mmol/L (ref 22–32)
Calcium: 10 mg/dL (ref 8.9–10.3)
Chloride: 111 mmol/L (ref 98–111)
Creatinine, Ser: 0.97 mg/dL (ref 0.61–1.24)
GFR, Estimated: >60 mL/min (ref >60)
Glucose, Bld: 103 mg/dL — ABNORMAL HIGH (ref 70–99)
Potassium: 3.6 mmol/L (ref 3.5–5.1)
Sodium: 143 mmol/L (ref 135–145)

## 2021-11-08 MED ORDER — ACETAMINOPHEN 500 MG PO TABS
1000.0000 mg | ORAL_TABLET | Freq: Once | ORAL | Status: AC
  Administered 2021-11-08: 1000 mg via ORAL
  Filled 2021-11-08: qty 2

## 2021-11-08 MED ORDER — HYDROCODONE-ACETAMINOPHEN 5-325 MG PO TABS
1.0000 | ORAL_TABLET | Freq: Once | ORAL | Status: AC
  Administered 2021-11-08: 1 via ORAL
  Filled 2021-11-08: qty 1

## 2021-11-08 MED ORDER — IBUPROFEN 200 MG PO TABS
600.0000 mg | ORAL_TABLET | Freq: Once | ORAL | Status: AC
  Administered 2021-11-08: 600 mg via ORAL
  Filled 2021-11-08: qty 3

## 2021-11-08 MED ORDER — IOHEXOL 300 MG/ML  SOLN
100.0000 mL | Freq: Once | INTRAMUSCULAR | Status: AC | PRN
  Administered 2021-11-08: 100 mL via INTRAVENOUS

## 2021-11-08 NOTE — ED Notes (Signed)
Patient refuses to change into a gown at this time.

## 2021-11-08 NOTE — ED Provider Notes (Signed)
[facility] DEPT Provider Note   CSN: [id number] Arrival date & time: 11/08/21  1251     History  Chief Complaint  Patient presents with   Groin Pain    [name] [name] is a 46 y.o. male with medical history of HIV, schizoaffective disorder and substance abuse.  Patient presents to ED for evaluation of groin pain and swelling.  The patient was seen in this ED and discharged 8 hours prior.  The patient returns complaining of the same thing that brought him in 8 hours ago.  The patient is complaining of right groin swelling and some redness to his right scrotum.  Patient denies any dysuria or discharge.  Patient denies nausea, vomiting.  The patient states he has been involved in a monogamous relationship with his wife for the last 7 years.  The patient denies any fevers, nausea, vomiting.   Groin Pain       Home Medications Prior to Admission medications   Medication Sig Start Date End Date Taking? Authorizing Provider  amLODipine (NORVASC) 5 MG tablet Take 1 tablet (5 mg total) by mouth daily. 11/03/21   [name] Shanks, MD  atorvastatin (LIPITOR) 20 MG tablet Take 1 tablet (20 mg total) by mouth daily. 10/10/21   [name] Hayward, MD  BENADRYL ALLERGY 25 MG capsule Take 25-50 mg by mouth every 6 (six) hours as needed for allergies or itching.    [provider]  benztropine (COGENTIN) 2 MG tablet Take 2 mg by mouth at bedtime. 04/26/20   [provider]  bictegravir-emtricitabine-tenofovir AF (BIKTARVY) 50-200-25 MG TABS tablet Take 1 tablet by mouth daily. Patient taking differently: Take 1 tablet by mouth at bedtime. 10/10/21   [name] Hayward, MD  chlorproMAZINE (THORAZINE) 200 MG tablet Take 200 mg by mouth at bedtime.    [provider]  haloperidol (HALDOL) 20 MG tablet Take 20 mg by mouth at bedtime. 04/26/20   [provider]  INVEGA SUSTENNA 234 MG/1.5ML injection Inject 234 mg into the muscle once.     [provider]  levETIRAcetam (KEPPRA) 500 MG tablet Take 1 tablet (500 mg total) by mouth 2 (two) times daily. Patient not taking: Reported on 10/13/2021 04/15/20   [name] [name], [name] Islam, MD  lithium carbonate 300 MG capsule Take 900 mg by mouth at bedtime as needed and may repeat dose one time if needed. 10/12/21   [provider]  penicillin v potassium (VEETID) 500 MG tablet Take 500 mg by mouth at bedtime. 09/13/21   [provider]  potassium chloride SA (KLOR-CON M) 20 MEQ tablet Take 1 tablet (20 mEq total) by mouth 2 (two) times daily. 11/04/21   [name] Shanks, MD  valACYclovir (VALTREX) 500 MG tablet Take 1 tablet (500 mg total) by mouth daily. Patient not taking: Reported on 10/13/2021 05/17/21   [name] [name], [name] Islam, MD  VISINE-AC 0.05-0.25 % ophthalmic solution Place 2 drops into both eyes 3 (three) times daily as needed (for irritation).    [provider]      Allergies    Prunus persica, Sudafed [pseudoephedrine], and Other    Review of Systems   Review of Systems  Constitutional:  Negative for chills and fever.  Genitourinary:  Positive for testicular pain. Negative for difficulty urinating, dysuria and flank pain.  Hematological:  Positive for adenopathy.  All other systems reviewed and are negative.   Physical Exam Updated Vital Signs BP (!) 148/99 (BP Location: Right Arm)  Pulse 76   Temp 98.7 F (37.1 C) (Oral)   Resp 18   SpO2 98%  Physical Exam Vitals and nursing note reviewed. Exam conducted with a chaperone present.  Constitutional:      General: He is not in acute distress.    Appearance: He is not ill-appearing, toxic-appearing or diaphoretic.  HENT:     Head: Normocephalic and atraumatic.     Nose: Nose normal. No congestion.     Mouth/Throat:     Mouth: Mucous membranes are moist.     Pharynx: Oropharynx is clear.  Eyes:     Extraocular Movements: Extraocular movements intact.     Conjunctiva/sclera: Conjunctivae  normal.     Pupils: Pupils are equal, round, and reactive to light.  Cardiovascular:     Rate and Rhythm: Normal rate and regular rhythm.  Pulmonary:     Effort: Pulmonary effort is normal.     Breath sounds: Normal breath sounds. No wheezing.  Abdominal:     General: Abdomen is flat. Bowel sounds are normal.     Palpations: Abdomen is soft.     Tenderness: There is no abdominal tenderness. There is no right CVA tenderness or left CVA tenderness.  Genitourinary:    Comments: Mild erythema L hemiscrotum without abscess or induration. There is tenderness in the R inguinal area. There is inguinal lymphadenopathy.  Musculoskeletal:     Cervical back: Normal range of motion and neck supple. No tenderness.  Skin:    General: Skin is warm and dry.     Capillary Refill: Capillary refill takes less than 2 seconds.  Neurological:     Mental Status: He is alert and oriented to person, place, and time.     ED Results / Procedures / Treatments   Labs (all labs ordered are listed, but only abnormal results are displayed) Labs Reviewed - No data to display  EKG None  Radiology CT Abdomen Pelvis W Contrast  Result Date: 11/08/2021 CLINICAL DATA:  46 year old male with history of swelling in the right groin concerning for complicated inguinal hernia. Pain during coughing and deep breathing. EXAM: CT ABDOMEN AND PELVIS WITH CONTRAST TECHNIQUE: Multidetector CT imaging of the abdomen and pelvis was performed using the standard protocol following bolus administration of intravenous contrast. RADIATION DOSE REDUCTION: This exam was performed according to the departmental dose-optimization program which includes automated exposure control, adjustment of the mA and/or kV according to patient size and/or use of iterative reconstruction technique. CONTRAST:  OMNIPAQUE IOHEXOL 300 MG/ML  SOLN COMPARISON:  CTA of the chest, abdomen and pelvis 04/13/2018. FINDINGS: Lower chest: Unremarkable.  Hepatobiliary: No suspicious cystic or solid hepatic lesions. No intra or extrahepatic biliary ductal dilatation. Gallbladder is normal in appearance. Pancreas: No pancreatic mass. No pancreatic ductal dilatation. No pancreatic or peripancreatic fluid collections or inflammatory changes. Spleen: Unremarkable. Adrenals/Urinary Tract: Bilateral kidneys and adrenal glands are normal in appearance. No hydroureteronephrosis. Urinary bladder is nearly decompressed, but otherwise unremarkable in appearance. Stomach/Bowel: Normal appearance of the stomach. No pathologic dilatation of small bowel or colon. Normal appendix. Vascular/Lymphatic: No significant atherosclerotic disease, aneurysm or dissection noted in the abdominal or pelvic vasculature. Circumaortic left renal vein (normal anatomical variant) incidentally noted. No lymphadenopathy noted in the abdomen or pelvis. Reproductive: Prostate gland and seminal vesicles are unremarkable in appearance. Other: No inguinal hernia. No significant volume of ascites. No pneumoperitoneum. Musculoskeletal: There are no aggressive appearing lytic or blastic lesions noted in the visualized portions of the skeleton. IMPRESSION: 1. No  acute findings are noted in the abdomen or pelvis to account for the patient's symptoms. Electronically Signed   By: Trudie Reed M.D.   On: 11/08/2021 07:03    Procedures Procedures   Medications Ordered in ED Medications  HYDROcodone-acetaminophen (NORCO/VICODIN) 5-325 MG per tablet 1 tablet (1 tablet Oral Given 11/08/21 1400)    ED Course/ Medical Decision Making/ A&P                           Medical Decision Making Risk Prescription drug management.   46 year old male presents to the ED for evaluation.  Please see HPI for further details.  On examination the patient is crying out in pain, screaming that he is going to "sue all y'all because you aren't doin' anything for me".  Patient afebrile and nontachycardic.  Patient  lung sounds clear bilaterally, patient airway intact.  Patient abdomen soft and compressible.  The patient does have slight erythema to his scrotum however cremasteric reflexes present.  There is no obvious sore present on the patient's penis.  Patient has no evidence of hernia that I am able to appreciate on visual inspection.  During the patient's initial visit this morning around 5 AM the patient had labs drawn to include BMP, CBC, urinalysis, GC chlamydia probe, CT abdomen pelvis.  The patient CBC was unremarkable, there is no leukocytosis.  The patient BMP was unremarkable.  The patient urinalysis is unremarkable.  The patient GC/chlamydia probe had not resulted yet.  Patient CT scan was unremarkable and the patient was discharged home and advised to follow-up with urology.  On my assessment, patient screaming out in pain.  We will ultrasound the patient's scrotum and provide him with a one-time dose of 5 mg hydrocodone for pain.  At the end of my shift, the patient's ultrasound of his scrotum not yet resulted.  The patient will be signed out to Theron Arista, PA-C for further management and disposition.  All pertinent physical exam findings and plan of management have been discussed with PA-C Rolly Salter.  Final Clinical Impression(s) / ED Diagnoses Final diagnoses:  Inguinal pain, unspecified laterality    Rx / DC Orders ED Discharge Orders     None         Al Decant, PA-C 11/08/21 1517    Ernie Avena, MD 11/08/21 1652

## 2021-11-08 NOTE — ED Provider Notes (Signed)
Patient was signed out to me pending results of scrotal ultrasound.  PMH of HIV, schizoaffective disorder and polysubstance use disorder.    He has been having right-sided groin pain  for the last day.  He was seen earlier this morning and had CT abdomen pelvis, laboratory work-up which was all unremarkable and negative.  He returned because the pain continued.  The pain is right-sided, comes and goes.  Denies any dysuria or discharge, he does not have any nausea or vomiting.  Sexually monogamous relationship.  Physical Exam  BP 134/85   Pulse 71   Temp 98.5 F (36.9 C) (Oral)   Resp 17   SpO2 100%   Physical Exam Vitals and nursing note reviewed. Exam conducted with a chaperone present.  Constitutional:      General: He is not in acute distress.    Appearance: Normal appearance.  HENT:     Head: Normocephalic and atraumatic.  Eyes:     General: No scleral icterus.    Extraocular Movements: Extraocular movements intact.     Pupils: Pupils are equal, round, and reactive to light.  Genitourinary:    Comments: Right inguinal tenderness, mild lymphadenopathy.  No appreciable abscess, cellulitis Skin:    Coloration: Skin is not jaundiced.  Neurological:     Mental Status: He is alert. Mental status is at baseline.     Coordination: Coordination normal.     Procedures  Procedures  ED Course / MDM    Medical Decision Making Risk OTC drugs. Prescription drug management.   I examined the patient, he is nonseptic.  I also reviewed labs, CT abdomen pelvis and ultrasound ordered today during different ED visits.  Given CT abdomen and pelvis with contrast without any acute findings, scrotal ultrasound with Doppler was negative for signs of torsion, urine is negative for UTI and patient's CBC and BMP were grossly unremarkable I do not think patient needs any additional work-up at this time.  I have encouraged him to follow-up with urology, return precautions were discussed.  I did  ordered Tylenol for the patient's pain when I reevaluated him and that helped.       Sherrill Raring, PA-C 11/08/21 2115    Varney Biles, MD 11/09/21 1030

## 2021-11-08 NOTE — ED Triage Notes (Signed)
Pt BIBA for headache, states his dog peed in the house and he had to sleep outside and the smell gave him a headache. Found sleeping in his car. States he has not slept in 4 days, took ice, cocaine, 'hard'.   140/90 100% RA CBG 124

## 2021-11-08 NOTE — ED Provider Notes (Signed)
[facility] DEPT  Provider Note  CSN: [id number] Arrival date & time: 11/08/21 [id number]  History Chief Complaint  Patient presents with   Headache   Abdominal Pain    [name] [name] is a 46 y.o. male with history of HIV, schizoaffective d/o and substance abuse has been sleeping in the car because his dog urinated in his house and the smell has given him a headache. Called EMS for the headache which he states has resolved since arriving in the ED and getting some fresh air. He does not desire any further workup for that.   He has also noted R groin swelling, and some redness to his L scrotum. No dysuria or discharge. No vomiting or diarrhea. States he only urinates once per day and on has a BM once per month. No reported fevers.    Home Medications Prior to Admission medications   Medication Sig Start Date End Date Taking? Authorizing Provider  amLODipine (NORVASC) 5 MG tablet Take 1 tablet (5 mg total) by mouth daily. 11/03/21   Charlesetta Shanks, MD  atorvastatin (LIPITOR) 20 MG tablet Take 1 tablet (20 mg total) by mouth daily. 10/10/21   Truman Hayward, MD  BENADRYL ALLERGY 25 MG capsule Take 25-50 mg by mouth every 6 (six) hours as needed for allergies or itching.    [provider]  benztropine (COGENTIN) 2 MG tablet Take 2 mg by mouth at bedtime. 04/26/20   [provider]  bictegravir-emtricitabine-tenofovir AF (BIKTARVY) 50-200-25 MG TABS tablet Take 1 tablet by mouth daily. Patient taking differently: Take 1 tablet by mouth at bedtime. 10/10/21   Truman Hayward, MD  chlorproMAZINE (THORAZINE) 200 MG tablet Take 200 mg by mouth at bedtime.    [provider]  haloperidol (HALDOL) 20 MG tablet Take 20 mg by mouth at bedtime. 04/26/20   [provider]  INVEGA SUSTENNA 234 MG/1.5ML injection Inject 234 mg into the muscle once.    [provider]  levETIRAcetam (KEPPRA) 500 MG tablet Take 1 tablet (500 mg  total) by mouth 2 (two) times daily. Patient not taking: Reported on 10/13/2021 04/15/20   Tommy Medal, Lavell Islam, MD  lithium carbonate 300 MG capsule Take 900 mg by mouth at bedtime as needed and may repeat dose one time if needed. 10/12/21   [provider]  penicillin v potassium (VEETID) 500 MG tablet Take 500 mg by mouth at bedtime. 09/13/21   [provider]  potassium chloride SA (KLOR-CON M) 20 MEQ tablet Take 1 tablet (20 mEq total) by mouth 2 (two) times daily. 11/04/21   Charlesetta Shanks, MD  valACYclovir (VALTREX) 500 MG tablet Take 1 tablet (500 mg total) by mouth daily. Patient not taking: Reported on 10/13/2021 05/17/21   Tommy Medal, Lavell Islam, MD  VISINE-AC 0.05-0.25 % ophthalmic solution Place 2 drops into both eyes 3 (three) times daily as needed (for irritation).    [provider]     Allergies    Prunus persica, Sudafed [pseudoephedrine], and Other   Review of Systems   Review of Systems Please see HPI for pertinent positives and negatives  Physical Exam BP 138/89   Pulse 70   Temp 98.7 F (37.1 C) (Oral)   Resp 15   Ht 5\' 10"  (1.778 m)   Wt 90 kg   SpO2 100%   BMI 28.47 kg/m   Physical Exam Vitals and nursing note reviewed.  Constitutional:      Appearance: Normal  appearance.  HENT:     Head: Normocephalic and atraumatic.     Nose: Nose normal.     Mouth/Throat:     Mouth: Mucous membranes are moist.  Eyes:     Extraocular Movements: Extraocular movements intact.     Conjunctiva/sclera: Conjunctivae normal.  Cardiovascular:     Rate and Rhythm: Normal rate.  Pulmonary:     Effort: Pulmonary effort is normal.     Breath sounds: Normal breath sounds.  Abdominal:     General: Abdomen is flat.     Palpations: Abdomen is soft.     Tenderness: There is no abdominal tenderness.  Genitourinary:    Comments: No penile sores or discharge. Mild erythema L hemiscrotum without abscess or induration. There is tenderness in the R inguinal  area, ?hernia. There is inguinal lymphadenopathy.  Musculoskeletal:        General: No swelling. Normal range of motion.     Cervical back: Neck supple.  Skin:    General: Skin is warm and dry.  Neurological:     General: No focal deficit present.     Mental Status: He is alert.  Psychiatric:        Mood and Affect: Mood normal.     ED Results / Procedures / Treatments   EKG None  Procedures Procedures  Medications Ordered in the ED Medications  ibuprofen (ADVIL) tablet 600 mg (has no administration in time range)  iohexol (OMNIPAQUE) 300 MG/ML solution 100 mL (100 mLs Intravenous Contrast Given 11/08/21 0643)    Initial Impression and Plan  Patient here with headache since resolved and not in need of ED workup. He is also concerned about R inguinal pain/swelling. On exam there is possibly a small hernia, but also has some enlarged lymph nodes. Will check labs, including GC/CT and UA. Send for CT to rule out hernia or abscess.   ED Course   Clinical Course as of 11/08/21 0718  Tue Nov 08, 2021  0632 CBC is normal.  [CS]  0636 UA is clear.  [CS]  8588 BMP is normal.  [CS]  0715 I personally viewed the images from radiology studies and agree with radiologist interpretation: CT is neg for acute process, hernia or pathologic nodes.   [CS]    Clinical Course User Index [CS] Truddie Hidden, MD     MDM Rules/Calculators/A&P Medical Decision Making Problems Addressed: Nonintractable headache, unspecified chronicity pattern, unspecified headache type: acute illness or injury Right inguinal pain: acute illness or injury  Amount and/or Complexity of Data Reviewed Labs: ordered. Decision-making details documented in ED Course. Radiology: ordered and independent interpretation performed. Decision-making details documented in ED Course.  Risk OTC drugs. Prescription drug management.    Final Clinical Impression(s) / ED Diagnoses Final diagnoses:  Nonintractable  headache, unspecified chronicity pattern, unspecified headache type  Right inguinal pain    Rx / DC Orders ED Discharge Orders     None        Truddie Hidden, MD 11/08/21 [id number]

## 2021-11-08 NOTE — ED Triage Notes (Addendum)
The patient presents with complaints of groin pain and swelling. He reports the pain started about 4 days ago. He was at the hospital last night from the same. He also complains of high blood pressure. He started taking medication for this last week.    EMS vitals: 160/98 BP 94 HR 97% SPO2 on room air

## 2021-11-08 NOTE — ED Triage Notes (Signed)
Pt also c/o 10/10 lower right abdominal pain and swelling, obvious swelling to right groin, increased pain with coughing and deep breathing. Endorses some urinary sx.

## 2021-11-08 NOTE — Discharge Instructions (Signed)
The CT scan, ultrasound, urine and laboratory work-up obtained today are all very reassuringly normal.  I am not sure what is causing the right groin pain, please follow-up with urology.  Return to the ED for fevers, new symptoms.

## 2021-11-09 LAB — GC/CHLAMYDIA PROBE AMP (~~LOC~~) NOT AT ARMC
Chlamydia: NEGATIVE
Comment: NEGATIVE
Comment: NORMAL
Neisseria Gonorrhea: NEGATIVE

## 2021-11-20 ENCOUNTER — Other Ambulatory Visit: Payer: Self-pay

## 2021-11-20 ENCOUNTER — Emergency Department (HOSPITAL_COMMUNITY)
Admission: EM | Admit: 2021-11-20 | Discharge: 2021-11-21 | Disposition: A | Discharge status: Home / Self Care | Payer: Medicaid Other | Attending: Emergency Medicine | Admitting: Emergency Medicine

## 2021-11-20 DIAGNOSIS — Z20822 Contact with and (suspected) exposure to covid-19: Secondary | ICD-10-CM | POA: Diagnosis not present

## 2021-11-20 DIAGNOSIS — F919 Conduct disorder, unspecified: Secondary | ICD-10-CM | POA: Diagnosis present

## 2021-11-20 DIAGNOSIS — R4182 Altered mental status, unspecified: Secondary | ICD-10-CM | POA: Insufficient documentation

## 2021-11-20 DIAGNOSIS — Z21 Asymptomatic human immunodeficiency virus [HIV] infection status: Secondary | ICD-10-CM | POA: Insufficient documentation

## 2021-11-20 DIAGNOSIS — R1084 Generalized abdominal pain: Secondary | ICD-10-CM | POA: Diagnosis not present

## 2021-11-20 DIAGNOSIS — F1410 Cocaine abuse, uncomplicated: Secondary | ICD-10-CM | POA: Diagnosis present

## 2021-11-20 LAB — CBC WITH DIFFERENTIAL/PLATELET
Abs Immature Granulocytes: 0.01 10*3/uL (ref 0.00–0.07)
Basophils Absolute: 0 10*3/uL (ref 0.0–0.1)
Basophils Relative: 0 %
Eosinophils Absolute: 0 10*3/uL (ref 0.0–0.5)
Eosinophils Relative: 1 %
HCT: 42.1 % (ref 39.0–52.0)
Hemoglobin: 14.1 g/dL (ref 13.0–17.0)
Immature Granulocytes: 0 %
Lymphocytes Relative: 16 %
Lymphs Abs: 1 10*3/uL (ref 0.7–4.0)
MCH: 31.5 pg (ref 26.0–34.0)
MCHC: 33.5 g/dL (ref 30.0–36.0)
MCV: 94.2 fL (ref 80.0–100.0)
Monocytes Absolute: 0.5 10*3/uL (ref 0.1–1.0)
Monocytes Relative: 9 %
Neutro Abs: 4.3 10*3/uL (ref 1.7–7.7)
Neutrophils Relative %: 74 %
Platelets: 175 10*3/uL (ref 150–400)
RBC: 4.47 MIL/uL (ref 4.22–5.81)
RDW: 11.2 % — ABNORMAL LOW (ref 11.5–15.5)
WBC: 5.8 10*3/uL (ref 4.0–10.5)
nRBC: 0 % (ref 0.0–0.2)

## 2021-11-20 LAB — RAPID URINE DRUG SCREEN, HOSP PERFORMED
Amphetamines: NOT DETECTED
Barbiturates: NOT DETECTED
Benzodiazepines: NOT DETECTED
Cocaine: POSITIVE — AB
Opiates: NOT DETECTED
Tetrahydrocannabinol: POSITIVE — AB

## 2021-11-20 LAB — BASIC METABOLIC PANEL
Anion gap: 7 (ref 5–15)
BUN: 10 mg/dL (ref 6–20)
CO2: 26 mmol/L (ref 22–32)
Calcium: 9.5 mg/dL (ref 8.9–10.3)
Chloride: 110 mmol/L (ref 98–111)
Creatinine, Ser: 1.26 mg/dL — ABNORMAL HIGH (ref 0.61–1.24)
GFR, Estimated: >60 mL/min (ref >60)
Glucose, Bld: 96 mg/dL (ref 70–99)
Potassium: 2.8 mmol/L — ABNORMAL LOW (ref 3.5–5.1)
Sodium: 143 mmol/L (ref 135–145)

## 2021-11-20 MED ORDER — POTASSIUM CHLORIDE CRYS ER 20 MEQ PO TBCR
20.0000 meq | EXTENDED_RELEASE_TABLET | Freq: Two times a day (BID) | ORAL | Status: DC
  Administered 2021-11-21: 20 meq via ORAL
  Filled 2021-11-20: qty 1

## 2021-11-20 MED ORDER — ZIPRASIDONE MESYLATE 20 MG IM SOLR
20.0000 mg | Freq: Once | INTRAMUSCULAR | Status: AC
  Administered 2021-11-20: 20 mg via INTRAMUSCULAR

## 2021-11-20 NOTE — ED Notes (Signed)
Pt. Given 2 breakfast sandwiches and soda.

## 2021-11-20 NOTE — ED Notes (Signed)
Pts belongings placed in pt belonging bags and placed in cabinet.

## 2021-11-20 NOTE — BH Assessment (Signed)
Received TTS consult order. Per RN, Pt received medication and is currently too somnolent to participate in tele-assessment.   [name] [name], [facility], [facility] Triage Specialist [phone]

## 2021-11-20 NOTE — ED Triage Notes (Signed)
Pt presents with [employer] with reports of taking "meth, molly, coke, every fucking thing."

## 2021-11-20 NOTE — ED Notes (Signed)
Pt threatening police stating "pull the trigger and I'll beat your ass." Pt pulling all lines off and screaming at staff.

## 2021-11-20 NOTE — ED Provider Notes (Signed)
[facility] DEPT Provider Note   CSN: [id number] Arrival date & time: 11/20/21  1605     History Chief Complaint  Patient presents with   Altered Mental Status    HPI [name] [name] is a 46 y.o. male presenting for altered mental status.  Patient has a history of aggressive behavior,Schizophrenia on Invega, HIV, polysubstance use, antisocial behavioral disorder.  He has not been taking of his medications he says.  He Spaete states that over the last 19 days, he has been using cocaine, methamphetamine, NMDA agonist daily.  Today he said he could not do it anymore and was on the roof of the local garage when police were called out.  He states he took all of his remaining pills to kill himself.  Initially unresponsive on arrival though moving appropriately to painful stimuli.  Was told that he was in a getting IV for blood work and became acutely aggressive reaching out to strike staff members.  Police had to be involved. Uncertain if patient has been compliant with his Invega. It appears he is not taking any of his medical therapies.  Patient's recorded medical, surgical, social, medication list and allergies were reviewed in the Snapshot window as part of the initial history.   Review of Systems   Review of Systems  Constitutional:  Negative for chills and fever.  HENT:  Negative for ear pain and sore throat.   Eyes:  Negative for pain and visual disturbance.  Respiratory:  Negative for cough and shortness of breath.   Cardiovascular:  Negative for chest pain and palpitations.  Gastrointestinal:  Negative for abdominal pain and vomiting.  Genitourinary:  Negative for dysuria and hematuria.  Musculoskeletal:  Negative for arthralgias and back pain.  Skin:  Negative for color change and rash.  Neurological:  Negative for seizures and syncope.  Psychiatric/Behavioral:  Positive for confusion.   All other systems reviewed and are negative.   Physical  Exam Updated Vital Signs BP (!) 163/95   Pulse 98   Resp 15   SpO2 97%  Physical Exam Vitals and nursing note reviewed.  Constitutional:      General: He is not in acute distress.    Appearance: He is well-developed.  HENT:     Head: Normocephalic and atraumatic.  Eyes:     Conjunctiva/sclera: Conjunctivae normal.  Cardiovascular:     Rate and Rhythm: Normal rate and regular rhythm.     Heart sounds: No murmur heard. Pulmonary:     Effort: Pulmonary effort is normal. No respiratory distress.     Breath sounds: Normal breath sounds.  Abdominal:     Palpations: Abdomen is soft.     Tenderness: There is no abdominal tenderness.  Musculoskeletal:        General: No swelling.     Cervical back: Neck supple.  Skin:    General: Skin is warm and dry.     Capillary Refill: Capillary refill takes less than 2 seconds.  Neurological:     Mental Status: He is alert.  Psychiatric:        Mood and Affect: Mood normal.     ED Course/ Medical Decision Making/ A&P    Procedures .Critical Care  Performed by: Tretha Sciara, MD Authorized by: Tretha Sciara, MD   Critical care provider statement:    Critical care time (minutes):  30   Critical care was necessary to treat or prevent imminent or life-threatening deterioration of the following conditions:  Toxidrome (overdose)  Critical care was time spent personally by me on the following activities:  Development of treatment plan with patient or surrogate, discussions with consultants, evaluation of patient's response to treatment, examination of patient, ordering and review of laboratory studies, ordering and review of radiographic studies, ordering and performing treatments and interventions, pulse oximetry, re-evaluation of patient's condition and review of old charts    Medications Ordered in ED Medications - No data to display  Medical Decision Making:    [name] [name] is a 46 y.o. male who presented to the ED today  with altered mental status, polysubstance use detailed above.     Patient's presentation is complicated by their history of multiple comorbid medical problems.  Patient placed on continuous vitals and telemetry monitoring while in ED which was reviewed periodically.   Complete initial physical exam performed, notably the patient  was rapidly oscillating between lethargy no response to pain to jumping out of bed swinging at staff members and swinging at himself.  Required police involvement multiple times within his first 30 minutes in the emergency department.   Due to to self in the setting of active psychosis, patient treated with Geodon with plan for reassessment   Reviewed and confirmed nursing documentation for past medical history, family history, social history.    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  Likely comorbid sympathomimetic overdose. Other diagnoses were considered including (but not limited to) intracranial mass, intracranial hemorrhage. These are considered less likely due to history of present illness and physical exam findings.   This is most consistent with an acute life/limb threatening illness complicated by underlying chronic conditions.  Initial Plan:  Concerning patient's overdose, will plan to observe in the emergency department until he is no longer grossly sympathomimetic.  Observe patient for 4 hours with improvement of heart rate, mental status, patient noted to be with intentional motions in the bed.   Clinical Impression: No diagnosis found.   Data Unavailable   Final Clinical Impression(s) / ED Diagnoses Final diagnoses:  None    Rx / DC Orders ED Discharge Orders     None

## 2021-11-21 DIAGNOSIS — F919 Conduct disorder, unspecified: Secondary | ICD-10-CM

## 2021-11-21 LAB — RESP PANEL BY RT-PCR (FLU A&B, COVID) ARPGX2
Influenza A by PCR: NEGATIVE
Influenza B by PCR: NEGATIVE
SARS Coronavirus 2 by RT PCR: NEGATIVE

## 2021-11-21 MED ORDER — DIAZEPAM 5 MG/ML IJ SOLN
5.0000 mg | Freq: Once | INTRAMUSCULAR | Status: DC
  Filled 2021-11-21: qty 2

## 2021-11-21 MED ORDER — POTASSIUM CHLORIDE CRYS ER 20 MEQ PO TBCR
20.0000 meq | EXTENDED_RELEASE_TABLET | Freq: Once | ORAL | Status: AC
  Administered 2021-11-21: 20 meq via ORAL
  Filled 2021-11-21: qty 1

## 2021-11-21 MED ORDER — DIAZEPAM 5 MG PO TABS
5.0000 mg | ORAL_TABLET | Freq: Once | ORAL | Status: AC
  Administered 2021-11-21: 5 mg via ORAL
  Filled 2021-11-21: qty 1

## 2021-11-21 NOTE — ED Notes (Signed)
Pt.s breakfast tray has arrived.   

## 2021-11-21 NOTE — ED Notes (Signed)
Psych eval performed. Will d/c patient and rescind IVC

## 2021-11-21 NOTE — ED Notes (Signed)
Patient asked for phone, explained to patient he is IVCd and we cant provide phone. Pt starts to remove equipment and tries to remove IV. Charge nurse in room explaining benefits of leaving IV. MD notified of agitation

## 2021-11-21 NOTE — ED Notes (Signed)
Pt removed IV and asked for valium in po form. MD aware

## 2021-11-21 NOTE — ED Notes (Signed)
Pt made his first phone call, pt made aware of phone calls policy

## 2021-11-21 NOTE — ED Provider Notes (Addendum)
Emergency Medicine Observation Re-evaluation Note  [name] [name] is a 46 y.o. male, seen on rounds today.  Pt initially presented to the ED for complaints of Altered Mental Status Currently, the patient is awake, requesting to leave with PD at bedside.  Physical Exam  BP 123/84 (BP Location: Right Arm)   Pulse (!) 59   Temp 98.1 F (36.7 C) (Oral)   Resp 16   SpO2 98%  Physical Exam General: Awake, alert Cardiac: Extremities well-perfused Lungs: Breathing is unlabored Psych: Mild agitation  ED Course / MDM  EKG:EKG Interpretation  Date/Time:  Sunday November 20 2021 16:25:16 EDT Ventricular Rate:  89 PR Interval:  165 QRS Duration: 88 QT Interval:  366 QTC Calculation: 446 R Axis:   44 Text Interpretation: Sinus rhythm Probable left atrial enlargement Probable anteroseptal infarct, old Confirmed by Tretha Sciara [phone]) on 11/20/2021 5:43:11 PM  I have reviewed the labs performed to date as well as medications administered while in observation.  Recent changes in the last 24 hours include patient presented to the ED yesterday for altered mental status.  This is suspected to be intoxication from polysubstance use.  He was initially unresponsive but then became very aggressive.  He has been medically cleared.  TTS has been consulted.  Patient woke this morning requesting to leave.  He is currently IVC'd.  He does request medication for anxiety.  Valium was ordered.  Patient had sustained resolution of agitation.  He was evaluated by psychiatry who has cleared him.  IVC was rescinded.  He was discharged in stable condition.  Plan  Current plan is for discharge.    Godfrey Pick, MD 11/21/21 0623    Godfrey Pick, MD 11/21/21 1137

## 2021-11-21 NOTE — ED Notes (Signed)
Pt has received his clothes and personal belongings. He has been calm and cooperative since shift change this morning. Police have remained with him and will escort him out of facility.

## 2021-11-21 NOTE — ED Notes (Signed)
Psychiatry talking to pt 
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
## 2021-11-21 NOTE — ED Notes (Signed)
Pt's belongings put in a pt belongings bag, up in the cabinets. - red pants - pair of white shoes - white chain necklace - a sheet containing pt's belongings in security with the key  With security: -2 red bank cards.

## 2021-11-21 NOTE — ED Notes (Signed)
Pt verbalizes understanding discharge instructions. 

## 2021-12-08 ENCOUNTER — Emergency Department (HOSPITAL_COMMUNITY)
Admission: EM | Admit: 2021-12-08 | Discharge: 2021-12-08 | Disposition: A | Discharge status: Home / Self Care | Payer: Medicaid Other | Attending: Emergency Medicine | Admitting: Emergency Medicine

## 2021-12-08 DIAGNOSIS — F1410 Cocaine abuse, uncomplicated: Secondary | ICD-10-CM | POA: Insufficient documentation

## 2021-12-08 DIAGNOSIS — R45850 Homicidal ideations: Secondary | ICD-10-CM | POA: Diagnosis not present

## 2021-12-08 DIAGNOSIS — F250 Schizoaffective disorder, bipolar type: Secondary | ICD-10-CM | POA: Diagnosis present

## 2021-12-08 DIAGNOSIS — Z1152 Encounter for screening for COVID-19: Secondary | ICD-10-CM | POA: Insufficient documentation

## 2021-12-08 LAB — RAPID URINE DRUG SCREEN, HOSP PERFORMED
Amphetamines: NOT DETECTED
Barbiturates: NOT DETECTED
Benzodiazepines: NOT DETECTED
Cocaine: POSITIVE — AB
Opiates: NOT DETECTED
Tetrahydrocannabinol: NOT DETECTED

## 2021-12-08 LAB — SARS CORONAVIRUS 2 BY RT PCR: SARS Coronavirus 2 by RT PCR: NEGATIVE

## 2021-12-08 MED ORDER — PALIPERIDONE PALMITATE ER 234 MG/1.5ML IM SUSY: 234.0000 mg | PREFILLED_SYRINGE | Freq: Once | INTRAMUSCULAR | Status: DC

## 2021-12-08 NOTE — Discharge Instructions (Addendum)
   [place] [facility] Walk-in Hours  Psychiatry/Medication Management Walk-Ins will be available Monday-Friday  Monday-Friday: 8 AM to 11 AM.  It is recommended that patients arrive by 7:30 AM to 7:45 AM because patients will be seen in the order of arrival.  Go to the second floor on arrival and check in.  **Availability is limited; therefore, patients may not be seen on the same day.**   Behavioral Health Urgent Care at [facility]-is open 24 hours/7 days a week for any urgent mental health needs  Please follow-up with your primary care doctor to receive your Invega shot and other medications.

## 2021-12-08 NOTE — ED Provider Notes (Signed)
[facility]-EMERGENCY DEPT Provider Note   CSN: [id number] Arrival date & time: 12/08/21  1036     History  No chief complaint on file.   [name] [name] is a 46 y.o. male with past medical history significant for schizoaffective bipolar type, cannabis use disorder, cocaine abuse, other polysubstance abuse, HIV, gonorrhea, with previous emergency department visits for homicidal ideation, behavior disturbances who presents in [facility] Department chaperone after patient requested to come to the hospital for his Geodon shot to calm down.  Patient has been cooperative during his evaluation with the police, reportedly they spoke with his primary care doctor who stated that he is welcome to come to their office for his Invega shot but he needs a negative COVID test first.  Patient reports that he is fine with this plan, just requesting a COVID shot at this time.  He denies any current SI, HI, AVH, and is in no acute distress at time my evaluation.  HPI     Home Medications Prior to Admission medications   Medication Sig Start Date End Date Taking? Authorizing Provider  amLODipine (NORVASC) 5 MG tablet Take 1 tablet (5 mg total) by mouth daily. Patient not taking: Reported on 11/21/2021 11/03/21   Arby Barrette, MD  atorvastatin (LIPITOR) 20 MG tablet Take 1 tablet (20 mg total) by mouth daily. Patient not taking: Reported on 11/21/2021 10/10/21   Daiva Eves, Lisette Grinder, MD  BENADRYL ALLERGY 25 MG capsule Take 25-50 mg by mouth every 6 (six) hours as needed for allergies or itching.    [provider]  benztropine (COGENTIN) 2 MG tablet Take 2 mg by mouth at bedtime. 04/26/20   [provider]  bictegravir-emtricitabine-tenofovir AF (BIKTARVY) 50-200-25 MG TABS tablet Take 1 tablet by mouth daily. Patient taking differently: Take 1 tablet by mouth at bedtime. 10/10/21   Randall Hiss, MD  chlorproMAZINE (THORAZINE) 200 MG tablet Take 200 mg by  mouth at bedtime.    [provider]  haloperidol (HALDOL) 20 MG tablet Take 20 mg by mouth at bedtime. 04/26/20   [provider]  ibuprofen (ADVIL) 200 MG tablet Take 400 mg by mouth every 6 (six) hours as needed for mild pain.    [provider]  INVEGA SUSTENNA 234 MG/1.5ML injection Inject 234 mg into the muscle once.    [provider]  lithium carbonate 300 MG capsule Take 900 mg by mouth at bedtime. Correct Sig from Pharmacy was 300 mg TID Q day. 10/12/21   [provider]  potassium chloride SA (KLOR-CON M) 20 MEQ tablet Take 1 tablet (20 mEq total) by mouth 2 (two) times daily. Patient not taking: Reported on 11/21/2021 11/04/21   Arby Barrette, MD  valACYclovir (VALTREX) 500 MG tablet Take 1 tablet (500 mg total) by mouth daily. Patient not taking: Reported on 10/13/2021 05/17/21   Daiva Eves, Lisette Grinder, MD  VISINE-AC 0.05-0.25 % ophthalmic solution Place 2 drops into both eyes 3 (three) times daily as needed (for irritation).    [provider]      Allergies    Prunus persica, Sudafed [pseudoephedrine], and Other    Review of Systems   Review of Systems  All other systems reviewed and are negative.   Physical Exam Updated Vital Signs BP (!) 144/102 (BP Location: Right Arm)   Pulse 85   Temp (!) 97.5 F (36.4 C) (Oral)   Resp 20   SpO2 100%  Physical Exam Vitals and nursing  note reviewed.  Constitutional:      General: He is not in acute distress.    Appearance: Normal appearance.  HENT:     Head: Normocephalic and atraumatic.  Eyes:     General:        Right eye: No discharge.        Left eye: No discharge.  Cardiovascular:     Rate and Rhythm: Normal rate and regular rhythm.  Pulmonary:     Effort: Pulmonary effort is normal. No respiratory distress.  Musculoskeletal:        General: No deformity.  Skin:    General: Skin is warm and dry.  Neurological:     Mental Status: He is alert and oriented to person,  place, and time.  Psychiatric:        Mood and Affect: Mood normal.        Behavior: Behavior normal.     Comments: Patient with some odd responses, and does endorse recent drug use, but he is not showing evidence of responding to internal stimuli, denies AVH, HI, SI, and is cooperative during his evaluation and with police     ED Results / Procedures / Treatments   Labs (all labs ordered are listed, but only abnormal results are displayed) Labs Reviewed  RAPID URINE DRUG SCREEN, HOSP PERFORMED - Abnormal; Notable for the following components:      Result Value   Cocaine POSITIVE (*)    All other components within normal limits  SARS CORONAVIRUS 2 BY RT PCR    EKG None  Radiology No results found.  Procedures Procedures    Medications Ordered in ED Medications - No data to display  ED Course/ Medical Decision Making/ A&P Clinical Course as of 12/08/21 1423  Thu Dec 08, 2021  1330 Behavioral health [place] Or pcp Per Nira Conn [CP]    Clinical Course User Index [CP] Olene Floss, PA-C                           Medical Decision Making Amount and/or Complexity of Data Reviewed Labs: ordered.   This is an overall well-appearing 46 year old male who presents with concern for need for his home medications, Invega shot.  He does not have any acute emergency or psychiatric issues at this time.  Patient was acting somewhat manic reportedly in police custody prior to arrival, and had recent cocaine use which she admitted to.  I was unable to get in contact with his primary care doctor, however reportedly they told him to present to their office after a negative COVID test, and police confirmed the story.  His COVID test is negative today as reviewed by myself.  His UDS is positive for cocaine which she admitted to.  At this time he has been monitored for several hours without any acute outbursts, evidence of aggressive behavior, or any need for more emergent  psychiatric evaluation.  I spoke with NP on-call for behavioral health, Nira Conn who reports that we do not give Invega shots in the emergency department as a rule and request that he either go to outpatient behavioral health services in [facility] or his primary care doctor. Final Clinical Impression(s) / ED Diagnoses Final diagnoses:  Cocaine abuse (HCC)  Schizoaffective disorder, bipolar type [facility])    Rx / DC Orders ED Discharge Orders     None         Olene Floss, PA-C 12/08/21  [place] Melanie, MD 12/08/21 [phone]
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
## 2021-12-08 NOTE — ED Notes (Signed)
Pt states he had a seizure 4 days ago resulting in him falling and hitting his head on the concrete. States he is dizzy when he lays down and occasionally off balance when walking. Redness to left posterior scalp noted, no abrasions or edema.

## 2022-06-10 LAB — LAB REPORT - SCANNED: EGFR: 63

## 2022-08-15 ENCOUNTER — Ambulatory Visit: Payer: Medicaid Other | Admitting: Infectious Disease

## 2022-08-21 ENCOUNTER — Ambulatory Visit (HOSPITAL_COMMUNITY)
Admission: EM | Admit: 2022-08-21 | Discharge: 2022-08-21 | Disposition: A | Discharge status: Home / Self Care | Payer: MEDICAID | Attending: Psychiatry | Admitting: Psychiatry

## 2022-08-21 DIAGNOSIS — Z008 Encounter for other general examination: Secondary | ICD-10-CM | POA: Insufficient documentation

## 2022-08-21 DIAGNOSIS — Z79899 Other long term (current) drug therapy: Secondary | ICD-10-CM

## 2022-08-21 NOTE — Discharge Instructions (Signed)
Take all medications as prescribed. Keep all follow-up appointments as scheduled.  Do not consume alcohol or use illegal drugs while on prescription medications. Report any adverse effects from your medications to your primary care provider promptly.  In the event of recurrent symptoms or worsening symptoms, call 911, a crisis hotline, or go to the nearest emergency department for evaluation.   

## 2022-08-21 NOTE — Progress Notes (Signed)
   08/21/22 1333  [facility] Triage Screening (Walk-ins at [facility] only)  How Did You Hear About [place]? Self  What Is the Reason for Your Visit/Call Today? Pt presents to [facility] voluntarily seeking outpatient services and he wants to see if he can get his Invega injection today. Pt reports last injection was given on 6/624. Pt was in jail from 10/23 to 6/24 and since being out of jail he has not been connected to services. Pt reports dx of schizophrenia and bipolar. Pt denies SI, HI, AVH and substance use. Pt other medications include Thorazine, lithium, cogentin some type of seizure meds.Pt has his other medications.  How Long Has This Been Causing You Problems? 1 wk - 1 month  Have You Recently Had Any Thoughts About Hurting Yourself? No  Are You Planning to Commit Suicide/Harm Yourself At This time? No  Have you Recently Had Thoughts About Hurting Someone Karolee Ohs? No  Are You Planning To Harm Someone At This Time? No  Are you currently experiencing any auditory, visual or other hallucinations? No  Have You Used Any Alcohol or Drugs in the Past 24 Hours? No  Do you have any current medical co-morbidities that require immediate attention? No  Clinician description of patient physical appearance/behavior: calm  What Do You Feel Would Help You the Most Today? Treatment for Depression or other mood problem;Medication(s)  If access to [facility] was not available, would you have sought care in the Emergency Department? No  Determination of Need Routine (7 days)  Options For Referral Medication Management;Outpatient Therapy

## 2022-08-21 NOTE — ED Notes (Signed)
Patient was seen in the outpatient setting.

## 2022-08-21 NOTE — ED Provider Notes (Signed)
Behavioral Health Urgent Care Medical Screening Exam  Patient Name: [name] [name] MRN: [id number] Date of Evaluation: 08/21/22 Chief Complaint:   "I need a long acting and a ACT team."  Diagnosis:  Final diagnoses:  Medication management    History of Present illness: [name] [name] is a 47 y.o. male.  Presents to [facility] urgent care accompanied by male friend.  Reports he was recently discharged from jail and has roughly 2 months of p.o. medications available however is interested in long-acting medication.  States he was referred by community response team ( [facility]).  States he is seeking to establish care and is interested in finding accepting provider.  Patient encouraged to follow-up with [facility] however, states he was kicked out of [place] and is unable to attend.  Patient was provided with outpatient resources for Evasions of life.  Patient made outpatient follow-up appointment with Dr. Doyne Keel on August 1st.  [name] [name] has a charted history with schizoaffective disorder bipolar type, suicidal ideations, Homicidal ideations and Polysubstance abuse.  Patient reported he is on the "highest dose of Inevga."   During evaluation [name] [name] is sitting ; he is alert/oriented x 4; calm/cooperative; and mood congruent with affect.  Patient is speaking in a clear tone at moderate volume, and normal pace; with good eye contact.  His thought process is coherent and relevant; There is no indication that he is currently responding to internal/external stimuli or experiencing delusional thought content.  Patient denies suicidal/self-harm/homicidal ideation, psychosis, and paranoia.  Patient has remained calm throughout assessment and has answered questions appropriately.    Flowsheet Row ED from 08/21/2022 in [facility] ED from 12/08/2021 in [facility] Emergency Department at [facility] ED from 11/08/2021 in [facility] Emergency Department at [facility]  C-SSRS RISK CATEGORY No Risk No Risk No Risk       Psychiatric Specialty Exam  Presentation  General Appearance:Casual; Disheveled  Eye Contact:Good  Speech:Clear and Coherent; Normal Rate  Speech Volume:Normal  Handedness:Right   Mood and Affect  Mood: Euthymic  Affect: Congruent   Thought Process  Thought Processes: Coherent; Irrevelant  Descriptions of Associations:Intact  Orientation:Full (Time, Place and Person)  Thought Content:Logical  Diagnosis of Schizophrenia or Schizoaffective disorder in past: No data recorded  Hallucinations:None  Ideas of Reference:None  Suicidal Thoughts:No  Homicidal Thoughts:No   Sensorium  Memory: Immediate Good; Recent Good; Remote Fair  Judgment: Fair  Insight: Fair   Art therapist  Concentration: Fair  Attention Span: Fair  Recall: [employer] of Knowledge: Fair  Language: Fair   Psychomotor Activity  Psychomotor Activity: Normal   Assets  Assets: Manufacturing systems engineer; Social Support; Housing; Intimacy   Sleep  Sleep: Good  Number of hours: 5  Physical Exam: Physical Exam Vitals and nursing note reviewed.  Cardiovascular:     Rate and Rhythm: Normal rate and regular rhythm.     Pulses: Normal pulses.     Heart sounds: Normal heart sounds.  Pulmonary:     Effort: Pulmonary effort is normal.     Breath sounds: Normal breath sounds.  Neurological:     Mental Status: He is oriented to person, place, and time.  Psychiatric:        Mood and Affect: Mood normal.        Behavior: Behavior normal.    Review of Systems  Psychiatric/Behavioral:  Negative for depression, substance abuse and suicidal ideas. The patient is nervous/anxious.   All other systems reviewed and  are negative.  There were no vitals taken for this visit. There is no height or weight on file to calculate BMI.  Musculoskeletal: Strength & Muscle Tone: within normal limits Gait & Station:  normal Patient leans: N/A   [facility] MSE Discharge Disposition for Follow up and Recommendations: Based on my evaluation the patient does not appear to have an emergency medical condition and can be discharged with resources and follow up care in outpatient services for Medication Management and ACT Services   Oneta Rack, NP 08/21/2022, 2:12 PM

## 2022-08-22 ENCOUNTER — Encounter (HOSPITAL_COMMUNITY): Payer: Self-pay | Admitting: Psychiatry

## 2022-08-22 ENCOUNTER — Ambulatory Visit (INDEPENDENT_AMBULATORY_CARE_PROVIDER_SITE_OTHER): Payer: MEDICAID | Admitting: Psychiatry

## 2022-08-22 VITALS — BP 109/81 | HR 69 | Temp 97.7°F | Ht 70.0 in | Wt 200.6 lb

## 2022-08-22 DIAGNOSIS — F250 Schizoaffective disorder, bipolar type: Secondary | ICD-10-CM | POA: Diagnosis not present

## 2022-08-22 MED ORDER — INVEGA SUSTENNA 234 MG/1.5ML IM SUSY: 234.0000 mg | PREFILLED_SYRINGE | INTRAMUSCULAR | 11 refills | Status: DC

## 2022-08-22 MED ORDER — CHLORPROMAZINE HCL 200 MG PO TABS: 400.0000 mg | ORAL_TABLET | Freq: Every morning | ORAL | 3 refills | Status: DC

## 2022-08-22 MED ORDER — LITHIUM CARBONATE 300 MG PO CAPS: 900.0000 mg | ORAL_CAPSULE | Freq: Every day | ORAL | 3 refills | Status: DC

## 2022-08-22 MED ORDER — CHLORPROMAZINE HCL 100 MG PO TABS: 100.0000 mg | ORAL_TABLET | Freq: Every day | ORAL | 3 refills | Status: DC

## 2022-08-22 MED ORDER — BENZTROPINE MESYLATE 1 MG PO TABS: 1.0000 mg | ORAL_TABLET | Freq: Two times a day (BID) | ORAL | 3 refills | Status: DC

## 2022-08-22 MED ORDER — CHLORPROMAZINE HCL 200 MG PO TABS: 200.0000 mg | ORAL_TABLET | Freq: Every day | ORAL | 3 refills | Status: DC

## 2022-08-22 NOTE — Progress Notes (Signed)
Psychiatric Initial Adult Assessment   Patient Identification: [name] [name] MRN:  [id number] Date of Evaluation:  08/22/2022 Referral Source: [facility] Chief Complaint:  "I need my Invega injection" Visit Diagnosis:    ICD-10-CM   1. Schizoaffective disorder, bipolar type (HCC)  F25.0 benztropine (COGENTIN) 1 MG tablet    chlorproMAZINE (THORAZINE) 200 MG tablet    lithium carbonate 300 MG capsule    INVEGA SUSTENNA 234 MG/1.5ML injection    chlorproMAZINE (THORAZINE) 200 MG tablet    chlorproMAZINE (THORAZINE) 100 MG tablet      History of Present Illness: 47 year old male seen today for initial psychiatric evaluation.  He was referred to outpatient psychiatry by [facility] where he presented on 08/21/2022.  Per chart review patient was recently discharged from jail and has roughly 2 months with his p.o. medications however is interested in long-acting medication.  He has a psychiatric history of Mild intellectual disability,schizoaffective disorder bipolar type, suicidal ideations, chronic homicidal ideations and Polysubstance abuse (cannabis use, cocaine use)  Per MAR patient received his last Invega injection on May 31st, 2024.  He is currently managed on Cogentin 1 mg twice daily, Thorazine 100 mg in the afternoon, Thorazine 200 in the morning, Thorazine 400 mg at bedtime, Thorazine 100 mg daily, Invega 234 mg monthly, and lithium 900 mg at bedtime.  He reports his medications are effective for managing his psychiatric conditions.  Today he was well-groomed, pleasant, cooperative, and engaged in conversation.  Patient's speech is slurred throughout the exam.  He informed writer that he has not been using alcohol and denies illegal drug use.  Patient notes that he has not utilize cocaine since he was incarcerated in October 2022.   Patient was seen with his significant other who reports that after taking his medication his speech is slightly slurred.  She notes that she is uncertain if he is  engaging in illegal substances.  Patient also does not have teeth or dentures which she reports contributes to the slurred speech.  Patient informed Clinical research associate that he is in need of his Invega injection.  He reports that he has not had it in over a month.  Provider requested records from [facility] in [place] [facility] with [facility] as patient receives care from these 2 facilities.Patient released from jail approximately on August 06, 2022 he reports that he gets his injection prior to his discharge. Per [facility] patient last received his invega injection on May 31st 2024.  Patient informed Clinical research associate that he feels somewhat paranoid when he thinks people are talking about him.  Patient notes that when he gets overly irritable he begins to bang his head and self injure himself.  Patient has a notable knot on his forehead from self-inflicted injury.  He denies wanting to harm himself today.  Today provider conducted a GAD-7 and patient scored an 11.  Provider also conducted PHQ-9 the patient scored a 9.  He endorses increased sleep and adequate appetite.  Patient informed writer that in the past he hallucinated.  He describes looking at a dot on the wall and then later seeing blood.  He denies current hallucinations.  Today he denies SI/HI/VAH.  Patient does endorse being irritable, distracted, having racing thoughts, and fluctuations in mood.  He notes that at times he can impulsively use cocaine.  He denies using illegal substances since his release from prison.  Patient informed Clinical research associate that the murder which is traumatic.  He denies flashbacks, nightmares, or avoidant behaviors.  Patient stated  No medication changes made today.  Patient agreeable to continue medication as prescribed.  He will follow-up in a week to receive his monthly Invega 24 mg injection.  No other concerns at this time.   Associated Signs/Symptoms: Depression Symptoms:  depressed  mood, anhedonia, insomnia, fatigue, feelings of worthlessness/guilt, difficulty concentrating, hopelessness, impaired memory, anxiety, disturbed sleep, (Hypo) Manic Symptoms:  Distractibility, Elevated Mood, Flight of Ideas, Licensed conveyancer, Impulsivity, Irritable Mood, Anxiety Symptoms:  Excessive Worry, Psychotic Symptoms:  Paranoia, PTSD Symptoms: Had a traumatic exposure:  Reports that the death of his brother was traumatic  Past Psychiatric History: Mild intellectual disability,schizoaffective disorder bipolar type, suicidal ideations, chronic homicidal ideations and Polysubstance abuse (cannabis use, cocaine use)  Previous Psychotropic Medications:  Thorazine, Cogentin, lithium, Zyprexa, Trileptal, Abilify, BuSpar, Depakote, gabapentin, hydroxyzine, Lamictal, trazodone, Geodon, and Ambien  Substance Abuse History in the last 12 months:  Yes.    Consequences of Substance Abuse: Medical Consequences:  Hospitalized over substance use and mental health Legal Consequences:  For r malicious conduct and violation of probation  Past Medical History:  Past Medical History:  Diagnosis Date   Aggression    Alcohol abuse    Asthma    Gonorrhea 02/09/2020   Grief 06/14/2020   HIV disease (HCC) 03/11/2019   Homeless    Hypertension    Penile discharge 05/19/2019   Schizophrenia [facility])     Past Surgical History:  Procedure Laterality Date   KNEE SURGERY     MOUTH SURGERY      Family Psychiatric History: Reports that his maternal family has issues with substance use.  Mental health conditions  Family History: No family history on file.  Social History:   Social History   Socioeconomic History   Marital status: Married    Spouse name: Not on file   Number of children: Not on file   Years of education: Not on file   Highest education level: Not on file  Occupational History   Not on file  Tobacco Use   Smoking status: Former   Smokeless tobacco: Never   Substance and Sexual Activity   Alcohol use: Not Currently   Drug use: Not Currently    Types: Cocaine, Marijuana, Hydrocodone, MDMA (Ecstacy)    Comment: former; patient states he has been clean since 01/25/20   Sexual activity: Yes    Partners: Female    Birth control/protection: Condom    Comment: "just give me 2 condoms"  Other Topics Concern   Not on file  Social History Narrative   Not on file   Social Determinants of Health   Financial Resource Strain: Not on file  Food Insecurity: Not on file  Transportation Needs: Not on file  Physical Activity: Not on file  Stress: Not on file  Social Connections: Not on file    Additional Social History: Patient resides in [place] with his mother.  He is not married but separated.  He has been dating his current girlfriend for 13 years.  Currently he has no children.  He is on disability.  Patient notes that he has not used cocaine, alcohol, or illegal substances since his release from prison.  He notes that he went to prison in October 2023  Allergies:   Allergies  Allergen Reactions   Prunus Persica Swelling and Other (See Comments)    "Peaches"   Sudafed [Pseudoephedrine] Anaphylaxis, Swelling and Rash   Other Itching and Other (See Comments)    Pollen- "closes my throat" and causes itchy eyes &  a runny nose    Metabolic Disorder Labs:  No results found for: "PROLACTIN" Lab Results  Component Value Date   CHOL 145 10/31/2021   TRIG 197 (H) 10/31/2021   HDL 33 (L) 10/31/2021   CHOLHDL 4.4 10/31/2021   VLDL 37 03/14/2009   LDLCALC 83 10/31/2021   LDLCALC 111 (H) 10/10/2021   Lab Results  Component Value Date   TSH 1.62 10/31/2021    Therapeutic Level Labs: Lab Results  Component Value Date   LITHIUM 0.11 (L) 11/03/2021   No results found for: "CBMZ" Lab Results  Component Value Date   VALPROATE <10 (L) 02/23/2015    Current Medications: Current Outpatient Medications  Medication Sig Dispense Refill    chlorproMAZINE (THORAZINE) 100 MG tablet Take 1 tablet (100 mg total) by mouth daily in the afternoon. 30 tablet 3   chlorproMAZINE (THORAZINE) 200 MG tablet Take 2 tablets (400 mg total) by mouth every morning. 60 tablet 3   amLODipine (NORVASC) 5 MG tablet Take 1 tablet (5 mg total) by mouth daily. (Patient not taking: Reported on 11/21/2021) 30 tablet 0   atorvastatin (LIPITOR) 20 MG tablet Take 1 tablet (20 mg total) by mouth daily. (Patient not taking: Reported on 11/21/2021) 30 tablet 11   BENADRYL ALLERGY 25 MG capsule Take 25-50 mg by mouth every 6 (six) hours as needed for allergies or itching.     benztropine (COGENTIN) 1 MG tablet Take 1 tablet (1 mg total) by mouth 2 (two) times daily. 60 tablet 3   bictegravir-emtricitabine-tenofovir AF (BIKTARVY) 50-200-25 MG TABS tablet Take 1 tablet by mouth daily. (Patient taking differently: Take 1 tablet by mouth at bedtime.) 30 tablet 11   chlorproMAZINE (THORAZINE) 200 MG tablet Take 1 tablet (200 mg total) by mouth at bedtime. 30 tablet 3   INVEGA SUSTENNA 234 MG/1.5ML injection Inject 234 mg into the muscle every 28 (twenty-eight) days. 1.5 mL 11   lithium carbonate 300 MG capsule Take 3 capsules (900 mg total) by mouth at bedtime. Correct Sig from Pharmacy was 300 mg TID Q day. 90 capsule 3   potassium chloride SA (KLOR-CON M) 20 MEQ tablet Take 1 tablet (20 mEq total) by mouth 2 (two) times daily. (Patient not taking: Reported on 11/21/2021) 10 tablet 0   valACYclovir (VALTREX) 500 MG tablet Take 1 tablet (500 mg total) by mouth daily. (Patient not taking: Reported on 10/13/2021) 30 tablet 11   VISINE-AC 0.05-0.25 % ophthalmic solution Place 2 drops into both eyes 3 (three) times daily as needed (for irritation).     No current facility-administered medications for this visit.    Musculoskeletal: Strength & Muscle Tone: within normal limits Gait & Station: normal Patient leans: N/A  Psychiatric Specialty Exam: Review of Systems  There  were no vitals taken for this visit.There is no height or weight on file to calculate BMI.  General Appearance: Well Groomed  Eye Contact:  Good  Speech:  Normal Rate and Slurred  Volume:  Normal  Mood:  Euthymic  Affect:  Appropriate and Congruent  Thought Process:  Coherent, Goal Directed, and Linear  Orientation:  Full (Time, Place, and Person)  Thought Content:  Logical and Paranoid Ideation  Suicidal Thoughts:  No  Homicidal Thoughts:  No  Memory:  Immediate;   Fair Recent;   Fair Remote;   Fair  Judgement:  Fair  Insight:  Fair  Psychomotor Activity:  Normal  Concentration:  Concentration: Fair and Attention Span: Fair  Recall:  Good  Fund of Knowledge:Good  Language: Good  Akathisia:  No  Handed:  Right  AIMS (if indicated):  done, 1  Assets:  Communication Skills Desire for Improvement Financial Resources/Insurance Housing Leisure Time Physical Health Social Support  ADL's:  Intact  Cognition: WNL  Sleep:  Good   Screenings: AIMS    Flowsheet Row Office Visit from 08/22/2022 in [facility]  AIMS Total Score 1      GAD-7    Flowsheet Row Office Visit from 08/22/2022 in [facility]  Total GAD-7 Score 11      PHQ2-9    Flowsheet Row Office Visit from 08/22/2022 in [facility] Office Visit from 10/10/2021 in [facility] for Infectious Disease Office Visit from 05/17/2021 in [facility] for Infectious Disease Office Visit from 07/14/2020 in [facility] for Infectious Disease Office Visit from 06/14/2020 in [facility] for Infectious Disease  PHQ-2 Total Score 2 0 0 1 0  PHQ-9 Total Score 9 -- -- 7 --      Flowsheet Row Office Visit from 08/22/2022 in [facility] ED from 08/21/2022 in [facility] ED from 12/08/2021 in [facility] Emergency Department at [facility]  C-SSRS RISK CATEGORY No Risk No Risk No Risk       Assessment and Plan: Patient informed writer that he is managed well on his current medication regimen.  No medication changes made today.  Patient agreeable to taking medication as prescribed.  Patient will follow-up in a week to receive his Invega injection.  No other concerns noted at this time.  1. Schizoaffective disorder, bipolar type (HCC)  Continue- benztropine (COGENTIN) 1 MG tablet; Take 1 tablet (1 mg total) by mouth 2 (two) times daily.  Dispense: 60 tablet; Refill: 3 Continue- chlorproMAZINE (THORAZINE) 200 MG tablet; Take 1 tablet (200 mg total) by mouth at bedtime.  Dispense: 30 tablet; Refill: 3 Continue- lithium carbonate 300 MG capsule; Take 3 capsules (900 mg total) by mouth at bedtime. Correct Sig from Pharmacy was 300 mg TID Q day.  Dispense: 90 capsule; Refill: 3 Continue- INVEGA SUSTENNA 234 MG/1.5ML injection; Inject 234 mg into the muscle every 28 (twenty-eight) days.  Dispense: 1.5 mL; Refill: 11 Continue- chlorproMAZINE (THORAZINE) 200 MG tablet; Take 2 tablets (400 mg total) by mouth every morning.  Dispense: 60 tablet; Refill: 3 Continue- chlorproMAZINE (THORAZINE) 100 MG tablet; Take 1 tablet (100 mg total) by mouth daily in the afternoon.  Dispense: 30 tablet; Refill: 3      Collaboration of Care: Other provider involved in patient's care AEB PCP and shot clinic staff  Patient/Guardian was advised Release of Information must be obtained prior to any record release in order to collaborate their care with an outside provider. Patient/Guardian was advised if they have not already done so to contact the registration department to sign all necessary forms in order for [place] to release information regarding their care.   Consent: Patient/Guardian gives verbal consent for treatment and assignment of benefits for services provided during this visit. Patient/Guardian expressed understanding and agreed to proceed.    Shanna Cisco, NP 7/2/20244:49 PM

## 2022-08-23 ENCOUNTER — Ambulatory Visit (INDEPENDENT_AMBULATORY_CARE_PROVIDER_SITE_OTHER): Payer: MEDICAID

## 2022-08-23 ENCOUNTER — Encounter (HOSPITAL_COMMUNITY): Payer: Self-pay

## 2022-08-23 VITALS — BP 124/90 | HR 85 | Ht 70.0 in | Wt 198.0 lb

## 2022-08-23 DIAGNOSIS — F200 Paranoid schizophrenia: Secondary | ICD-10-CM

## 2022-08-23 DIAGNOSIS — F411 Generalized anxiety disorder: Secondary | ICD-10-CM | POA: Diagnosis not present

## 2022-08-23 DIAGNOSIS — G4700 Insomnia, unspecified: Secondary | ICD-10-CM

## 2022-08-23 MED ORDER — PALIPERIDONE PALMITATE ER 234 MG/1.5ML IM SUSY
234.0000 mg | PREFILLED_SYRINGE | Freq: Once | INTRAMUSCULAR | Status: AC
  Administered 2022-08-23: 234 mg via INTRAMUSCULAR

## 2022-08-23 NOTE — Progress Notes (Cosign Needed)
PATIENT PRESENTED TO THE OFFICE FOR INVEGA SUSTENNA 234 INJECTION . PATIENT TOLERATED THE INJECTION WELL IN THE RIGHT DELTOID GIVEN BY Carles Florea ,HE WILL RETURN IN 28 DAYS FOR NEXT INJECTION .

## 2022-09-19 ENCOUNTER — Ambulatory Visit (HOSPITAL_COMMUNITY): Payer: MEDICAID | Admitting: Psychiatry

## 2022-09-19 ENCOUNTER — Ambulatory Visit (INDEPENDENT_AMBULATORY_CARE_PROVIDER_SITE_OTHER): Payer: MEDICAID

## 2022-09-19 ENCOUNTER — Encounter (HOSPITAL_COMMUNITY): Payer: Self-pay

## 2022-09-19 VITALS — BP 126/87 | HR 72 | Resp 16 | Ht 70.0 in | Wt 204.9 lb

## 2022-09-19 DIAGNOSIS — F250 Schizoaffective disorder, bipolar type: Secondary | ICD-10-CM | POA: Diagnosis not present

## 2022-09-19 DIAGNOSIS — F200 Paranoid schizophrenia: Secondary | ICD-10-CM | POA: Diagnosis not present

## 2022-09-19 MED ORDER — CHLORPROMAZINE HCL 200 MG PO TABS: 400.0000 mg | ORAL_TABLET | Freq: Every morning | ORAL | 3 refills | Status: DC

## 2022-09-19 MED ORDER — CHLORPROMAZINE HCL 200 MG PO TABS: ORAL_TABLET | ORAL | 3 refills | Status: DC

## 2022-09-19 MED ORDER — INVEGA SUSTENNA 234 MG/1.5ML IM SUSY: 234.0000 mg | PREFILLED_SYRINGE | INTRAMUSCULAR | 11 refills | Status: DC

## 2022-09-19 MED ORDER — LITHIUM CARBONATE 300 MG PO CAPS: 900.0000 mg | ORAL_CAPSULE | Freq: Every day | ORAL | 3 refills | Status: DC

## 2022-09-19 MED ORDER — BENZTROPINE MESYLATE 1 MG PO TABS: 1.0000 mg | ORAL_TABLET | Freq: Two times a day (BID) | ORAL | 3 refills | Status: DC

## 2022-09-19 MED ORDER — CHLORPROMAZINE HCL 100 MG PO TABS: 100.0000 mg | ORAL_TABLET | Freq: Every day | ORAL | 3 refills | Status: DC

## 2022-09-19 MED ORDER — PALIPERIDONE PALMITATE ER 234 MG/1.5ML IM SUSY
234.0000 mg | PREFILLED_SYRINGE | INTRAMUSCULAR | Status: DC
  Administered 2022-09-19: 234 mg via INTRAMUSCULAR

## 2022-09-19 MED ORDER — CHLORPROMAZINE HCL 200 MG PO TABS: 200.0000 mg | ORAL_TABLET | Freq: Every day | ORAL | 3 refills | Status: DC
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
## 2022-09-19 NOTE — Progress Notes (Cosign Needed)
Patient arrived for Injection of Invega 234mg . Asking if he can get a bigger dose because he is "all over the place with lots of energy." Patient is asking to speak with MD. Dr. Doyne Keel spoke with patient regarding his medications. Denies any issues or other complaints. Given in Left Deltoid without issue.

## 2022-09-20 ENCOUNTER — Encounter (HOSPITAL_COMMUNITY): Payer: Self-pay

## 2022-09-20 NOTE — Progress Notes (Cosign Needed)
Patient arrived for Injection of Invega 234mg . Asking if he can get a bigger dose because he is "all over the place with lots of energy." Patient is asking to speak with MD. Dr. Doyne Keel spoke with patient regarding his medications. Denies any issues or other complaints. Given in Left Deltoid without issue.

## 2022-09-21 ENCOUNTER — Ambulatory Visit (HOSPITAL_COMMUNITY): Payer: MEDICAID | Admitting: Psychiatry

## 2022-09-25 ENCOUNTER — Telehealth (HOSPITAL_COMMUNITY): Payer: Self-pay | Admitting: Psychiatry

## 2022-09-25 ENCOUNTER — Other Ambulatory Visit (HOSPITAL_COMMUNITY): Payer: MEDICAID

## 2022-09-26 ENCOUNTER — Other Ambulatory Visit: Payer: Self-pay | Admitting: Psychiatry

## 2022-09-26 NOTE — Telephone Encounter (Signed)
Mr. [name] [name] is not on release of information forms.  At this time provider is unable to release these documents without patient's permission.  Provider attempted to call patient at each number on file without success.  Patients mother also gave writer two [phone] and [phone] however they both went to voicemail.  Provider will fill these forms out at patient's request and released it to Mr. [name] [name] with patient's permission.  Patient can call the clinic back for further questions or concerns.  He will need to sign a release of information form for Mr. [name] [name] to receive information.

## 2022-09-28 NOTE — Progress Notes (Signed)
Provider attempted to call the patient 4 times without success.  Lab results will be discussed with patient at his follow-up visit.

## 2022-10-10 ENCOUNTER — Encounter (HOSPITAL_COMMUNITY): Payer: Self-pay

## 2022-10-10 ENCOUNTER — Other Ambulatory Visit: Payer: Self-pay

## 2022-10-10 ENCOUNTER — Ambulatory Visit (HOSPITAL_COMMUNITY)
Admission: EM | Admit: 2022-10-10 | Discharge: 2022-10-10 | Disposition: A | Discharge status: Home / Self Care | Payer: No Typology Code available for payment source | Source: Home / Self Care

## 2022-10-10 ENCOUNTER — Emergency Department (HOSPITAL_COMMUNITY)
Admission: EM | Admit: 2022-10-10 | Discharge: 2022-10-10 | Disposition: A | Discharge status: Home / Self Care | Payer: No Typology Code available for payment source | Attending: Emergency Medicine | Admitting: Emergency Medicine

## 2022-10-10 ENCOUNTER — Ambulatory Visit (HOSPITAL_COMMUNITY): Payer: MEDICAID

## 2022-10-10 DIAGNOSIS — Z5900 Homelessness unspecified: Secondary | ICD-10-CM | POA: Insufficient documentation

## 2022-10-10 DIAGNOSIS — Z79899 Other long term (current) drug therapy: Secondary | ICD-10-CM | POA: Insufficient documentation

## 2022-10-10 DIAGNOSIS — G4709 Other insomnia: Secondary | ICD-10-CM | POA: Insufficient documentation

## 2022-10-10 DIAGNOSIS — R45851 Suicidal ideations: Secondary | ICD-10-CM | POA: Insufficient documentation

## 2022-10-10 DIAGNOSIS — F1414 Cocaine abuse with cocaine-induced mood disorder: Secondary | ICD-10-CM | POA: Insufficient documentation

## 2022-10-10 DIAGNOSIS — F250 Schizoaffective disorder, bipolar type: Secondary | ICD-10-CM | POA: Insufficient documentation

## 2022-10-10 DIAGNOSIS — I10 Essential (primary) hypertension: Secondary | ICD-10-CM | POA: Diagnosis not present

## 2022-10-10 DIAGNOSIS — R441 Visual hallucinations: Secondary | ICD-10-CM

## 2022-10-10 DIAGNOSIS — Z21 Asymptomatic human immunodeficiency virus [HIV] infection status: Secondary | ICD-10-CM | POA: Diagnosis not present

## 2022-10-10 DIAGNOSIS — J45909 Unspecified asthma, uncomplicated: Secondary | ICD-10-CM | POA: Diagnosis not present

## 2022-10-10 DIAGNOSIS — K068 Other specified disorders of gingiva and edentulous alveolar ridge: Secondary | ICD-10-CM | POA: Diagnosis not present

## 2022-10-10 DIAGNOSIS — F919 Conduct disorder, unspecified: Secondary | ICD-10-CM

## 2022-10-10 DIAGNOSIS — B20 Human immunodeficiency virus [HIV] disease: Secondary | ICD-10-CM

## 2022-10-10 DIAGNOSIS — F1910 Other psychoactive substance abuse, uncomplicated: Secondary | ICD-10-CM | POA: Insufficient documentation

## 2022-10-10 DIAGNOSIS — F1994 Other psychoactive substance use, unspecified with psychoactive substance-induced mood disorder: Secondary | ICD-10-CM

## 2022-10-10 LAB — CBC WITH DIFFERENTIAL/PLATELET
Abs Immature Granulocytes: 0.01 10*3/uL (ref 0.00–0.07)
Basophils Absolute: 0 10*3/uL (ref 0.0–0.1)
Basophils Relative: 1 %
Eosinophils Absolute: 0.1 10*3/uL (ref 0.0–0.5)
Eosinophils Relative: 1 %
HCT: 43.9 % (ref 39.0–52.0)
Hemoglobin: 14.4 g/dL (ref 13.0–17.0)
Immature Granulocytes: 0 %
Lymphocytes Relative: 24 %
Lymphs Abs: 1.6 10*3/uL (ref 0.7–4.0)
MCH: 31.6 pg (ref 26.0–34.0)
MCHC: 32.8 g/dL (ref 30.0–36.0)
MCV: 96.3 fL (ref 80.0–100.0)
Monocytes Absolute: 0.6 10*3/uL (ref 0.1–1.0)
Monocytes Relative: 9 %
Neutro Abs: 4.2 10*3/uL (ref 1.7–7.7)
Neutrophils Relative %: 65 %
Platelets: 188 10*3/uL (ref 150–400)
RBC: 4.56 MIL/uL (ref 4.22–5.81)
RDW: 11.3 % — ABNORMAL LOW (ref 11.5–15.5)
WBC: 6.5 10*3/uL (ref 4.0–10.5)
nRBC: 0 % (ref 0.0–0.2)

## 2022-10-10 LAB — COMPREHENSIVE METABOLIC PANEL
ALT: 19 U/L (ref 0–44)
AST: 21 U/L (ref 15–41)
Albumin: 4.2 g/dL (ref 3.5–5.0)
Alkaline Phosphatase: 71 U/L (ref 38–126)
Anion gap: 10 (ref 5–15)
BUN: 7 mg/dL (ref 6–20)
CO2: 24 mmol/L (ref 22–32)
Calcium: 9.7 mg/dL (ref 8.9–10.3)
Chloride: 104 mmol/L (ref 98–111)
Creatinine, Ser: 1.07 mg/dL (ref 0.61–1.24)
GFR, Estimated: >60 mL/min (ref >60)
Glucose, Bld: 114 mg/dL — ABNORMAL HIGH (ref 70–99)
Potassium: 3.4 mmol/L — ABNORMAL LOW (ref 3.5–5.1)
Sodium: 138 mmol/L (ref 135–145)
Total Bilirubin: 1.2 mg/dL (ref 0.3–1.2)
Total Protein: 7.4 g/dL (ref 6.5–8.1)

## 2022-10-10 LAB — LITHIUM LEVEL: Lithium Lvl: 0.09 mmol/L — ABNORMAL LOW (ref 0.60–1.20)

## 2022-10-10 MED ORDER — MAGNESIUM HYDROXIDE 400 MG/5ML PO SUSP: 30.0000 mL | Freq: Every day | ORAL | Status: DC | PRN

## 2022-10-10 MED ORDER — ACETAMINOPHEN 325 MG PO TABS
650.0000 mg | ORAL_TABLET | Freq: Four times a day (QID) | ORAL | Status: DC | PRN
  Administered 2022-10-10: 650 mg via ORAL
  Filled 2022-10-10: qty 2

## 2022-10-10 MED ORDER — HYDROXYZINE HCL 25 MG PO TABS
25.0000 mg | ORAL_TABLET | Freq: Three times a day (TID) | ORAL | Status: DC | PRN
  Administered 2022-10-10: 25 mg via ORAL
  Filled 2022-10-10: qty 1

## 2022-10-10 MED ORDER — BICTEGRAVIR-EMTRICITAB-TENOFOV 50-200-25 MG PO TABS: 1.0000 | ORAL_TABLET | Freq: Every day | ORAL | Status: DC

## 2022-10-10 MED ORDER — ALUM & MAG HYDROXIDE-SIMETH 200-200-20 MG/5ML PO SUSP: 30.0000 mL | ORAL | Status: DC | PRN
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
## 2022-10-10 NOTE — Progress Notes (Deleted)
PATIENT PRESENTED TO THE OFFICE FOR INVEGA SUSTENNA 234 INJECTION . PATIENT TOLERATED THE INJECTION WELL IN THE RIGHT DELTOID GIVEN BY DONNA ,HE WILL RETURN IN 28 DAYS FOR NEXT INJECTION .

## 2022-10-10 NOTE — BH Assessment (Signed)
Comprehensive Clinical Assessment (CCA) Note   10/10/2022 Thaer Gongwer [id number]  Disposition: Rockney Ghee, NP recommends continuous observation. Pt will be reassess during AM shift.   The patient demonstrates the following risk factors for suicide: Chronic risk factors for suicide include: psychiatric disorder of schizoaffective Disorder . Acute risk factors for suicide include: family or marital conflict. Protective factors for this patient include: coping skills. Considering these factors, the overall suicide risk at this point appears to be moderate. Patient is not appropriate for outpatient follow up.     Pt is a 47 yo male who presents to [facility] voluntarily via [facility]. Pt has hx of schizo affective disorder, bipolar type, homelessness, polysubstance abuse, suicidal ideation. Pt has complaints of suicidal ideation, and requesting to be placed back on his home medications. Pt was seen at [facility] earlier and was escorted out to do cussing out staff. Pt reports that he is suicidal with the plan to shoot himself. Pt reports that he will get a gun from his friend. Pt also reports being off his psychiatric medications for 8 weeks and has had no sleep in the past 8 days. Pt denies HI.  Pt reports that he has a therapist at [facility] but does not know her name.  Pt was dressed casually and groomed appropriately. Pt is alert, oriented x4 with normal speech and normal motor behavior. Eye contact is good. Pt's mood is depressed, and affect is flat. Thought process is coherent and relevant. Pt's insight is fair and judgement is poor. There is no indication pt is currently responding to internal stimuli or experiencing delusional thought content. Pt was cooperative throughout assessment.     Chief Complaint: SI Visit Diagnosis:   Schizoaffective disorder, bipolar type   CCA Screening, Triage and Referral (STR)  Patient Reported Information How did you hear about [place]? Legal System  What Is the Reason  for Your Visit/Call Today? Pt is a 47 yo male who presents to [facility] voluntarily via [facility]PD. Pt has hx of schizo affective disorder, bipolar type, homelessness, polysubstance abuse, suicidal ideation. Pt has complaints of suicidal ideation, and requesting to be placed back on his home medications. Pt was seen at [facility] earlier and was escorted out to do cussing out staff. Pt reports that he is suicidal with the plan to shoot himself. Pt reports that he will get a gun from his friend. Pt also reports being off his psychiatric medications for 8 weeks and has had no sleep in the past 8 days. Pt denies HI.  Pt reports that he has a therapist at [facility] but does not know her name.  How Long Has This Been Causing You Problems? 1 wk - 1 month  What Do You Feel Would Help You the Most Today? Treatment for Depression or other mood problem; Medication(s); Stress Management   Have You Recently Had Any Thoughts About Hurting Yourself? Yes  Are You Planning to Commit Suicide/Harm Yourself At This time? No   Flowsheet Row ED from 10/10/2022 in [facility] Most recent reading at 10/10/2022  5:58 AM ED from 10/10/2022 in [facility] Emergency Department at [facility] Most recent reading at 10/10/2022  2:46 AM Office Visit from 08/22/2022 in [facility] Most recent reading at 08/22/2022  9:42 AM  C-SSRS RISK CATEGORY Moderate Risk No Risk No Risk       Have you Recently Had Thoughts About Hurting Someone Karolee Ohs? No  Are You Planning to Harm Someone at This  Time? No  Explanation: Pt denies HI   Have You Used Any Alcohol or Drugs in the Past 24 Hours? Yes  What Did You Use and How Much? Marijuana   Do You Currently Have a Therapist/Psychiatrist? Yes  Name of Therapist/Psychiatrist: Name of Therapist/Psychiatrist: Therapist at [facility]   Have You Been Recently Discharged From Any Office Practice or Programs? No  Explanation of Discharge From  Practice/Program: n/a     CCA Screening Triage Referral Assessment Type of Contact: Face-to-Face  Telemedicine Service Delivery:   Is this Initial or Reassessment?   Date Telepsych consult ordered in [facility]:    Time Telepsych consult ordered in [facility]:    Location of Assessment: [facility] Assessment Services  Provider Location: [facility] Assessment Services   Collateral Involvement: nonw   Does Patient Have a Automotive engineer Guardian? No  Legal Guardian Contact Information: n/a  Copy of Legal Guardianship Form: -- (n/a)  Legal Guardian Notified of Arrival: -- (n/a)  Legal Guardian Notified of Pending Discharge: -- (n/a)  If Minor and Not Living with Parent(s), Who has Custody? n/a  Is CPS involved or ever been involved? Never  Is APS involved or ever been involved? Never   Patient Determined To Be At Risk for Harm To Self or Others Based on Review of Patient Reported Information or Presenting Complaint? Yes, for Self-Harm  Method: Plan without intent  Availability of Means: No access or NA  Intent: Vague intent or NA  Notification Required: No need or identified person  Additional Information for Danger to Others Potential: -- (n/a)  Additional Comments for Danger to Others Potential: n/a  Are There Guns or Other Weapons in Your Home? No  Types of Guns/Weapons: Pt reports that he will get a gun from friend to use to shoot himself.  Are These Weapons Safely Secured?                            No  Who Could Verify You Are Able To Have These Secured: Pt reports that he will get a gun from friend to use to shoot himself.  Do You Have any Outstanding Charges, Pending Court Dates, Parole/Probation? Pt denies pending legal charges  Contacted To Inform of Risk of Harm To Self or Others: -- (n/a)    Does Patient Present under Involuntary Commitment? No    [place] of Residence: [place]   Patient Currently Receiving the Following Services: Individual  Therapy   Determination of Need: Urgent (48 hours)   Options For Referral: Inpatient Hospitalization     CCA Biopsychosocial Patient Reported Schizophrenia/Schizoaffective Diagnosis in Past: No   Strengths: unknown   Mental Health Symptoms Depression:   Sleep (too much or little) (Denied depression or sadness)   Duration of Depressive symptoms:  Duration of Depressive Symptoms: N/A   Mania:   None   Anxiety:    Irritability   Psychosis:   None (Did not appear to be responding to internal stimuli)   Duration of Psychotic symptoms:    Trauma:   None   Obsessions:   Recurrent & persistent thoughts/impulses/images (Ptinet was fixated on his brother's murder and violent death)   Compulsions:   Repeated behaviors/mental acts (Per IVC, speaks of killing people and during assessment he explained his thoughts were of killing the people he believes are responsible for his brother's death.)   Inattention:   None   Hyperactivity/Impulsivity:   None   Oppositional/Defiant Behaviors:  None (Hx of violence and aggression. No violence displayed during assessment.)   Emotional Irregularity:   None (Hx of emotional irregularity. None displayed during assessment.)   Other Mood/Personality Symptoms:   Patient stated he has not been taking his prescribed medications. Pt reports no sleeping for the past 8 days.    Mental Status Exam Appearance and self-care  Stature:   Tall   Weight:   Average weight   Clothing:   Casual; Disheveled   Grooming:   Neglected   Cosmetic use:   None   Posture/gait:   Slumped   Motor activity:   Restless   Sensorium  Attention:   Distractible   Concentration:   Focuses on irrelevancies; Scattered   Orientation:   Person; Place; Situation; Time   Recall/memory:   Normal   Affect and Mood  Affect:   Full Range   Mood:   Pessimistic (Patient told in great detail of how his relationship with his "wife" was not  working.)   Relating  Eye contact:   Normal   Facial expression:   Responsive   Attitude toward examiner:   Cooperative; Patent attorney and Language  Speech flow:  Clear and Coherent (Patient was somewhat coherent and did not seem disorganized.)   Thought content:   Appropriate to Mood and Circumstances   Preoccupation:   Homicidal   Hallucinations:   None   Organization:   Disorganized   Company secretary of Knowledge:   Poor   Intelligence:   Average   Abstraction:   Concrete   Judgement:   Impaired   Reality Testing:   Distorted   Insight:   Lacking   Decision Making:   Confused   Social Functioning  Social Maturity:   Impulsive   Social Judgement:   "Street Smart"   Stress  Stressors:   Family conflict; Grief/losses; Legal   Coping Ability:   Exhausted; Overwhelmed   Skill Deficits:   Communication; Decision making; Interpersonal; Responsibility; Self-control   Supports:   Support needed     Religion: Religion/Spirituality Are You A Religious Person?: No (NA)  Leisure/Recreation: Leisure / Recreation Do You Have Hobbies?: No (NA)  Exercise/Diet: Exercise/Diet Do You Exercise?: No (NA) Have You Gained or Lost A Significant Amount of Weight in the Past Six Months?: No (NA) Do You Follow a Special Diet?: No (NA) Do You Have Any Trouble Sleeping?: Yes Explanation of Sleeping Difficulties: Pt reports that he has not sleep in 8 days .   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?: No (unknown)  Education: Education Is Patient Currently Attending School?: No Last Grade Completed: 12 Did You Attend College?: No Did You Have An Individualized Education Program (IIEP):  No Did You Have Any Difficulty At School?: No (unknown) Patient's Education Has Been Impacted by Current Illness: No   CCA Family/Childhood History Family and Relationship History: Family history Marital status: Married Number of Years Married:  (UTA) What types of issues is patient dealing with in the relationship?: Pt reports getting in an arguement with wife yesterday. Additional relationship information: n/a Does patient have children?:  (NA)  Childhood History:  Childhood History By whom was/is the patient raised?:  (unknown) Did patient suffer any verbal/emotional/physical/sexual abuse as a child?: No (unknown) Did patient suffer from severe childhood  neglect?: No Has patient ever been sexually abused/assaulted/raped as an adolescent or adult?: No (unknown) Was the patient ever a victim of a crime or a disaster?: No Witnessed domestic violence?: No (unknown) Has patient been affected by domestic violence as an adult?: No (unknown)       CCA Substance Use Alcohol/Drug Use: Alcohol / Drug Use Pain Medications: see MAR Prescriptions: see MAR Over the Counter: see MAR History of alcohol / drug use?: Yes Longest period of sobriety (when/how long): unknown Negative Consequences of Use: Personal relationships (Patient stated he did not want his "wife" to know he had "used anything recently.") Withdrawal Symptoms: None (unknown) Substance #1 Name of Substance 1: Marijuana 1 - Duration: Daily ... last use yesterday 10/09/22... unknown amount                       ASAM's:  Six Dimensions of Multidimensional Assessment  Dimension 1:  Acute Intoxication and/or Withdrawal Potential:      Dimension 2:  Biomedical Conditions and Complications:      Dimension 3:  Emotional, Behavioral, or Cognitive Conditions and Complications:     Dimension 4:  Readiness to Change:     Dimension 5:  Relapse, Continued use, or Continued Problem Potential:     Dimension 6:   Recovery/Living Environment:     ASAM Severity Score:    ASAM Recommended Level of Treatment: ASAM Recommended Level of Treatment:  (n/a)   Substance use Disorder (SUD) Substance Use Disorder (SUD)  Checklist Symptoms of Substance Use:  (n/a)  Recommendations for Services/Supports/Treatments: Recommendations for Services/Supports/Treatments Recommendations For Services/Supports/Treatments:  (n/a)  Discharge Disposition:    DSM5 Diagnoses: Patient Active Problem List   Diagnosis Date Noted   Grief 06/14/2020   Gonorrhea 02/09/2020   Penile discharge 05/19/2019   HIV disease (HCC) 03/11/2019   Cocaine abuse (HCC) 10/04/2015   Suicidal ideation 09/21/2015   Cannabis use disorder, severe, dependence (HCC) 06/18/2015   Behavior disturbance    Schizoaffective disorder, bipolar type (HCC) 04/28/2015   Adult antisocial behavior 04/04/2015   Polysubstance abuse (HCC) 04/04/2015   Homicidal ideation 10/16/2014   Obstructive sleep apnea 10/16/2014     Referrals to Alternative Service(s): Referred to Alternative Service(s):   Place:   Date:   Time:    Referred to Alternative Service(s):   Place:   Date:   Time:    Referred to Alternative Service(s):   Place:   Date:   Time:    Referred to Alternative Service(s):   Place:   Date:   Time:     Dava Najjar, [place], [name]

## 2022-10-10 NOTE — Discharge Instructions (Signed)
Please continue to take medications as directed. If your symptoms return, worsen, or persist please call your 48, report to local ER, or contact crisis hotline. Please do not drink alcohol or use any illegal substances while taking prescription medications.    Symptoms to watch out for include depression, difficulty sleeping, decreased need for sleep, poor appetite, mood swings(up and downs), suicidal thoughts, confusion, change in speech, poor decision making/impulsivity.   Remember that each time you use illicit substances and or alcohol your psychosis/mood lability can return. Each state of psychosis is detrimental to your brain cells, making the condition less likely reversible and more difficult to treat.   Resources and assistance to guide patient with appropriate referral processes have been placed in your Follow up contact information.   -You may benefit from community support team to provide stability by reducing further psychiatric, and or substance use symptoms, interventions for coping skills, psychoeducation, and service coordination to community services to include ongoing mental health care.

## 2022-10-10 NOTE — ED Provider Notes (Signed)
[place] EMERGENCY DEPARTMENT AT [facility] Provider Note  CSN: [id number] Arrival date & time: 10/10/22 7846  Chief Complaint(s) Psychiatric Evaluation  HPI [name] [name] is a 47 y.o. male with extensive past medical history listed below who presents to the emergency department for insomnia stating that he has not been able to sleep in 8 days.  Patient reports that he is not taking his psychiatric medication in the last several months.  States that he is compliant with his HIV medicine.  Reports history of cocaine use but states that he has not used it recently.  He states that he has been smoking weed to try to help him sleep.  Reports that while smoking weed, he sees visions of him hurting someone.  States that he currently does not have any thoughts of hurting anyone.  Denies any suicidal ideation.  HPI  Past Medical History Past Medical History:  Diagnosis Date   Aggression    Alcohol abuse    Asthma    Gonorrhea 02/09/2020   Grief 06/14/2020   HIV disease (HCC) 03/11/2019   Homeless    Hypertension    Penile discharge 05/19/2019   Schizophrenia [facility])    Patient Active Problem List   Diagnosis Date Noted   Grief 06/14/2020   Gonorrhea 02/09/2020   Penile discharge 05/19/2019   HIV disease (HCC) 03/11/2019   Cocaine abuse (HCC) 10/04/2015   Suicidal ideation 09/21/2015   Cannabis use disorder, severe, dependence (HCC) 06/18/2015   Behavior disturbance    Schizoaffective disorder, bipolar type (HCC) 04/28/2015   Adult antisocial behavior 04/04/2015   Polysubstance abuse (HCC) 04/04/2015   Homicidal ideation 10/16/2014   Obstructive sleep apnea 10/16/2014   Home Medication(s) Prior to Admission medications   Medication Sig Start Date End Date Taking? Authorizing Provider  amLODipine (NORVASC) 5 MG tablet Take 1 tablet (5 mg total) by mouth daily. Patient not taking: Reported on 11/21/2021 11/03/21   Arby Barrette, MD  atorvastatin (LIPITOR) 20 MG  tablet Take 1 tablet (20 mg total) by mouth daily. Patient not taking: Reported on 11/21/2021 10/10/21   Daiva Eves, Lisette Grinder, MD  BENADRYL ALLERGY 25 MG capsule Take 25-50 mg by mouth every 6 (six) hours as needed for allergies or itching.    [provider]  benztropine (COGENTIN) 1 MG tablet Take 1 tablet (1 mg total) by mouth 2 (two) times daily. 09/19/22   Shanna Cisco, NP  bictegravir-emtricitabine-tenofovir AF (BIKTARVY) 50-200-25 MG TABS tablet Take 1 tablet by mouth daily. Patient taking differently: Take 1 tablet by mouth at bedtime. 10/10/21   Randall Hiss, MD  chlorproMAZINE (THORAZINE) 100 MG tablet Take 1 tablet (100 mg total) by mouth daily in the afternoon. 09/19/22   Shanna Cisco, NP  chlorproMAZINE (THORAZINE) 200 MG tablet Take 2 tablets (400 mg total) by mouth every morning AND 1 tablet (200 mg total) at bedtime. 09/19/22   Shanna Cisco, NP  lithium carbonate 300 MG capsule Take 3 capsules (900 mg total) by mouth at bedtime. Correct Sig from Pharmacy was 300 mg TID Q day. 09/19/22   Shanna Cisco, NP  paliperidone (INVEGA SUSTENNA) 234 MG/1.5ML injection Inject 234 mg into the muscle every 21 ( twenty-one) days. 09/19/22   Shanna Cisco, NP  potassium chloride SA (KLOR-CON M) 20 MEQ tablet Take 1 tablet (20 mEq total) by mouth 2 (two) times daily. Patient not taking: Reported on 11/21/2021 11/04/21   Arby Barrette, MD  valACYclovir Ralph Dowdy)  500 MG tablet Take 1 tablet (500 mg total) by mouth daily. Patient not taking: Reported on 10/13/2021 05/17/21   Daiva Eves, Lisette Grinder, MD  VISINE-AC 0.05-0.25 % ophthalmic solution Place 2 drops into both eyes 3 (three) times daily as needed (for irritation).    [provider]                                                                                                                                    Allergies Prunus persica, Sudafed [pseudoephedrine], and Other  Review of  Systems Review of Systems As noted in HPI  Physical Exam Vital Signs  I have reviewed the triage vital signs BP (!) 126/95   Pulse 95   Temp 99.2 F (37.3 C) (Oral)   Resp 17   Ht 5\' 10"  (1.778 m)   Wt 92.9 kg   SpO2 99%   BMI 29.39 kg/m   Physical Exam Vitals reviewed.  Constitutional:      General: He is not in acute distress.    Appearance: He is well-developed. He is not diaphoretic.  HENT:     Head: Normocephalic and atraumatic.     Comments: Edentulous    Right Ear: External ear normal.     Left Ear: External ear normal.     Nose: Nose normal.     Mouth/Throat:     Mouth: Mucous membranes are moist.  Eyes:     General: No scleral icterus.    Conjunctiva/sclera: Conjunctivae normal.  Neck:     Trachea: Phonation normal.  Cardiovascular:     Rate and Rhythm: Normal rate and regular rhythm.  Pulmonary:     Effort: Pulmonary effort is normal. No respiratory distress.     Breath sounds: No stridor.  Abdominal:     General: There is no distension.  Musculoskeletal:        General: Normal range of motion.     Cervical back: Normal range of motion.  Neurological:     Mental Status: He is alert and oriented to person, place, and time.  Psychiatric:        Behavior: Behavior normal.     ED Results and Treatments Labs (all labs ordered are listed, but only abnormal results are displayed) Labs Reviewed - No data to display  EKG  EKG Interpretation Date/Time:    Ventricular Rate:    PR Interval:    QRS Duration:    QT Interval:    QTC Calculation:   R Axis:      Text Interpretation:         Radiology No results found.  Medications Ordered in ED Medications - No data to display Procedures Procedures  (including critical care time) Medical Decision Making / ED Course   Medical Decision Making   Insomnia History of  schizophrenia.  Does not appear to be a threat to himself or others at this time Recommended OTC meds. Psych f/u as needed    Final Clinical Impression(s) / ED Diagnoses Final diagnoses:  Other insomnia   The patient appears reasonably screened and/or stabilized for discharge and I doubt any other medical condition or other [facility] requiring further screening, evaluation, or treatment in the ED at this time. I have discussed the findings, Dx and Tx plan with the patient/family who expressed understanding and agree(s) with the plan. Discharge instructions discussed at length. The patient/family was given strict return precautions who verbalized understanding of the instructions. No further questions at time of discharge.  Disposition: Discharge  Condition: Good  ED Discharge Orders     None       Follow Up: [facility] Address: [address] [place], [place] [address] Hours: Open 24 hours Monday through Sunday Phone: [phone] Go to  as needed    This chart was dictated using voice recognition software.  Despite best efforts to proofread,  errors can occur which can change the documentation meaning.    Nira Conn, MD 10/10/22 [phone]

## 2022-10-10 NOTE — ED Notes (Signed)
Patient currently at nurses station. Is calm and cooperative. Requesting to speak with his therapist who he sees her on the second floor. No additional needs at this moment. Will continue to monitor for safety.

## 2022-10-10 NOTE — ED Notes (Signed)
Pt refused vitals 

## 2022-10-10 NOTE — ED Notes (Signed)
 Patient was provided breakfast
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
## 2022-10-10 NOTE — ED Notes (Signed)
Patient observed resting quietly, eyes closed. Respirations equal and unlabored. Will continue to monitor for safety.  

## 2022-10-10 NOTE — ED Notes (Addendum)
Patient advised that he will still be discharged. He states "I'm going to act on it. I have a Gloc 9 with 17 rounds". Provider is aware of pt's statement that he is still experiencing SI. Patient discharged without incident. Belongings returned from locker #14. Patient escorted to the lobby via staff. AVS reviewed with and given to patient for discharge home.

## 2022-10-10 NOTE — ED Notes (Signed)
Pt A&O x 4, presents with suicidal ideations, plan to borrow gun and blow his brains out he states.the patient calm & cooperative, no distress noted.  Meds given for headache.  Monitoring for safety.

## 2022-10-10 NOTE — ED Triage Notes (Signed)
Pt states that he hasn't taken his medications in 8 weeks, he has only taken his HIV medications. Pt states that he doesn't know if he has warrants when asked why he hasn't taken it. Pt reports smoking some weed and beginning to see visions of him hurting someone. Pt denies harming anyone.

## 2022-10-10 NOTE — ED Notes (Signed)
Pt cussing at staff and other patients. Escorted out of department by gpd and security at discharge.

## 2022-10-14 ENCOUNTER — Other Ambulatory Visit (HOSPITAL_COMMUNITY): Payer: Self-pay | Admitting: Psychiatry

## 2022-10-14 DIAGNOSIS — F250 Schizoaffective disorder, bipolar type: Secondary | ICD-10-CM

## 2022-10-24 ENCOUNTER — Ambulatory Visit (HOSPITAL_COMMUNITY): Payer: MEDICAID

## 2022-10-24 ENCOUNTER — Ambulatory Visit: Payer: Medicaid Other | Admitting: Infectious Disease

## 2022-10-24 ENCOUNTER — Encounter: Payer: Self-pay | Admitting: Infectious Disease

## 2022-10-24 DIAGNOSIS — Z7185 Encounter for immunization safety counseling: Secondary | ICD-10-CM

## 2022-10-24 HISTORY — DX: Encounter for immunization safety counseling: Z71.85
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
## 2022-10-25 ENCOUNTER — Emergency Department (HOSPITAL_COMMUNITY): Payer: MEDICAID

## 2022-10-25 ENCOUNTER — Other Ambulatory Visit: Payer: Self-pay

## 2022-10-25 ENCOUNTER — Emergency Department (HOSPITAL_COMMUNITY)
Admission: EM | Admit: 2022-10-25 | Discharge: 2022-10-27 | Disposition: A | Discharge status: Home / Self Care | Payer: MEDICAID | Attending: Emergency Medicine | Admitting: Emergency Medicine

## 2022-10-25 DIAGNOSIS — F1914 Other psychoactive substance abuse with psychoactive substance-induced mood disorder: Secondary | ICD-10-CM | POA: Diagnosis present

## 2022-10-25 DIAGNOSIS — R451 Restlessness and agitation: Secondary | ICD-10-CM | POA: Insufficient documentation

## 2022-10-25 DIAGNOSIS — T50901A Poisoning by unspecified drugs, medicaments and biological substances, accidental (unintentional), initial encounter: Secondary | ICD-10-CM | POA: Diagnosis not present

## 2022-10-25 DIAGNOSIS — F250 Schizoaffective disorder, bipolar type: Secondary | ICD-10-CM | POA: Diagnosis not present

## 2022-10-25 DIAGNOSIS — Z5900 Homelessness unspecified: Secondary | ICD-10-CM | POA: Insufficient documentation

## 2022-10-25 DIAGNOSIS — Z21 Asymptomatic human immunodeficiency virus [HIV] infection status: Secondary | ICD-10-CM | POA: Diagnosis not present

## 2022-10-25 DIAGNOSIS — R45851 Suicidal ideations: Secondary | ICD-10-CM | POA: Diagnosis not present

## 2022-10-25 DIAGNOSIS — R45850 Homicidal ideations: Secondary | ICD-10-CM

## 2022-10-25 DIAGNOSIS — F1994 Other psychoactive substance use, unspecified with psychoactive substance-induced mood disorder: Secondary | ICD-10-CM | POA: Diagnosis present

## 2022-10-25 DIAGNOSIS — T1491XA Suicide attempt, initial encounter: Secondary | ICD-10-CM

## 2022-10-25 LAB — COMPREHENSIVE METABOLIC PANEL
ALT: 27 U/L (ref 0–44)
AST: 36 U/L (ref 15–41)
Albumin: 4.5 g/dL (ref 3.5–5.0)
Alkaline Phosphatase: 82 U/L (ref 38–126)
Anion gap: 8 (ref 5–15)
BUN: 14 mg/dL (ref 6–20)
CO2: 24 mmol/L (ref 22–32)
Calcium: 9.6 mg/dL (ref 8.9–10.3)
Chloride: 105 mmol/L (ref 98–111)
Creatinine, Ser: 1.19 mg/dL (ref 0.61–1.24)
GFR, Estimated: >60 mL/min (ref >60)
Glucose, Bld: 92 mg/dL (ref 70–99)
Potassium: 3.4 mmol/L — ABNORMAL LOW (ref 3.5–5.1)
Sodium: 137 mmol/L (ref 135–145)
Total Bilirubin: 1.1 mg/dL (ref 0.3–1.2)
Total Protein: 8 g/dL (ref 6.5–8.1)

## 2022-10-25 LAB — CBC WITH DIFFERENTIAL/PLATELET
Abs Immature Granulocytes: 0.03 10*3/uL (ref 0.00–0.07)
Basophils Absolute: 0 10*3/uL (ref 0.0–0.1)
Basophils Relative: 1 %
Eosinophils Absolute: 0.1 10*3/uL (ref 0.0–0.5)
Eosinophils Relative: 1 %
HCT: 44.5 % (ref 39.0–52.0)
Hemoglobin: 14.6 g/dL (ref 13.0–17.0)
Immature Granulocytes: 0 %
Lymphocytes Relative: 16 %
Lymphs Abs: 1.2 10*3/uL (ref 0.7–4.0)
MCH: 32.1 pg (ref 26.0–34.0)
MCHC: 32.8 g/dL (ref 30.0–36.0)
MCV: 97.8 fL (ref 80.0–100.0)
Monocytes Absolute: 0.7 10*3/uL (ref 0.1–1.0)
Monocytes Relative: 9 %
Neutro Abs: 5.3 10*3/uL (ref 1.7–7.7)
Neutrophils Relative %: 73 %
Platelets: 182 10*3/uL (ref 150–400)
RBC: 4.55 MIL/uL (ref 4.22–5.81)
RDW: 11.3 % — ABNORMAL LOW (ref 11.5–15.5)
WBC: 7.4 10*3/uL (ref 4.0–10.5)
nRBC: 0 % (ref 0.0–0.2)

## 2022-10-25 LAB — RAPID URINE DRUG SCREEN, HOSP PERFORMED
Amphetamines: POSITIVE — AB
Barbiturates: NOT DETECTED
Benzodiazepines: NOT DETECTED
Cocaine: POSITIVE — AB
Opiates: NOT DETECTED
Tetrahydrocannabinol: POSITIVE — AB

## 2022-10-25 LAB — LITHIUM LEVEL: Lithium Lvl: 0.09 mmol/L — ABNORMAL LOW (ref 0.60–1.20)

## 2022-10-25 LAB — ETHANOL: Alcohol, Ethyl (B): <10 mg/dL (ref <10)

## 2022-10-25 MED ORDER — HALOPERIDOL LACTATE 5 MG/ML IJ SOLN
5.0000 mg | Freq: Once | INTRAMUSCULAR | Status: AC
  Administered 2022-10-25: 5 mg via INTRAVENOUS
  Filled 2022-10-25: qty 1

## 2022-10-25 MED ORDER — ALUM & MAG HYDROXIDE-SIMETH 200-200-20 MG/5ML PO SUSP: 30.0000 mL | Freq: Four times a day (QID) | ORAL | Status: DC | PRN

## 2022-10-25 MED ORDER — NICOTINE 21 MG/24HR TD PT24
21.0000 mg | MEDICATED_PATCH | Freq: Every day | TRANSDERMAL | Status: DC
  Administered 2022-10-26 – 2022-10-27 (×2): 21 mg via TRANSDERMAL
  Filled 2022-10-25 (×3): qty 1

## 2022-10-25 MED ORDER — CHLORPROMAZINE HCL 100 MG PO TABS
100.0000 mg | ORAL_TABLET | Freq: Every day | ORAL | Status: DC
  Administered 2022-10-25 – 2022-10-26 (×2): 100 mg via ORAL
  Filled 2022-10-25 (×2): qty 1

## 2022-10-25 MED ORDER — PALIPERIDONE PALMITATE ER 234 MG/1.5ML IM SUSY
234.0000 mg | PREFILLED_SYRINGE | Freq: Once | INTRAMUSCULAR | Status: AC
  Administered 2022-10-26: 234 mg via INTRAMUSCULAR
  Filled 2022-10-25: qty 1.5

## 2022-10-25 MED ORDER — LEVETIRACETAM 500 MG PO TABS
500.0000 mg | ORAL_TABLET | Freq: Two times a day (BID) | ORAL | Status: DC
  Administered 2022-10-25 – 2022-10-27 (×4): 500 mg via ORAL
  Filled 2022-10-25 (×4): qty 1

## 2022-10-25 MED ORDER — ZIPRASIDONE MESYLATE 20 MG IM SOLR: 20.0000 mg | INTRAMUSCULAR | Status: DC | PRN

## 2022-10-25 MED ORDER — LITHIUM CARBONATE 300 MG PO CAPS
900.0000 mg | ORAL_CAPSULE | Freq: Every day | ORAL | Status: DC
  Administered 2022-10-25 – 2022-10-26 (×2): 900 mg via ORAL
  Filled 2022-10-25 (×2): qty 3

## 2022-10-25 MED ORDER — LORAZEPAM 1 MG PO TABS: 1.0000 mg | ORAL_TABLET | ORAL | Status: DC | PRN

## 2022-10-25 MED ORDER — BENZTROPINE MESYLATE 1 MG PO TABS
1.0000 mg | ORAL_TABLET | Freq: Two times a day (BID) | ORAL | Status: DC
  Administered 2022-10-25 – 2022-10-27 (×4): 1 mg via ORAL
  Filled 2022-10-25 (×2): qty 1
  Filled 2022-10-25: qty 2
  Filled 2022-10-25: qty 1

## 2022-10-25 MED ORDER — RISPERIDONE 0.5 MG PO TBDP
2.0000 mg | ORAL_TABLET | Freq: Three times a day (TID) | ORAL | Status: DC | PRN
  Administered 2022-10-25 – 2022-10-26 (×2): 2 mg via ORAL
  Filled 2022-10-25 (×2): qty 4

## 2022-10-25 MED ORDER — ZOLPIDEM TARTRATE 5 MG PO TABS: 5.0000 mg | ORAL_TABLET | Freq: Every evening | ORAL | Status: DC | PRN

## 2022-10-25 MED ORDER — ACETAMINOPHEN 325 MG PO TABS
650.0000 mg | ORAL_TABLET | ORAL | Status: DC | PRN
  Administered 2022-10-26: 650 mg via ORAL
  Filled 2022-10-25: qty 2

## 2022-10-25 MED ORDER — ONDANSETRON HCL 4 MG PO TABS: 4.0000 mg | ORAL_TABLET | Freq: Three times a day (TID) | ORAL | Status: DC | PRN

## 2022-10-25 NOTE — ED Notes (Signed)
Two bag of belongings placed in locker 29
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
## 2022-10-25 NOTE — ED Notes (Signed)
Patient speaking to TTS at this time.

## 2022-10-25 NOTE — ED Notes (Signed)
Pt has been calm and cooperative for this Clinical research associate.
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
## 2022-10-26 DIAGNOSIS — F1994 Other psychoactive substance use, unspecified with psychoactive substance-induced mood disorder: Secondary | ICD-10-CM | POA: Diagnosis not present

## 2022-10-26 MED ORDER — PALIPERIDONE ER 3 MG PO TB24
3.0000 mg | ORAL_TABLET | Freq: Every day | ORAL | Status: DC
  Administered 2022-10-26: 3 mg via ORAL
  Filled 2022-10-26: qty 1

## 2022-10-26 MED ORDER — BICTEGRAVIR-EMTRICITAB-TENOFOV 50-200-25 MG PO TABS
1.0000 | ORAL_TABLET | Freq: Every day | ORAL | Status: DC
  Administered 2022-10-26 – 2022-10-27 (×2): 1 via ORAL
  Filled 2022-10-26 (×2): qty 1

## 2022-10-26 NOTE — Progress Notes (Addendum)
Brief Pharmacy note:  See 7/30 note for visit with Gretchen Short NP: noted that patient received Invega 234mg  injection.  New inpatient order for Invega 234mg  ordered in ED for patient to be given 9/5, not yet verified.   Requesting med from [facility]  Otho Bellows PharmD 10/26/2022, 7:57 AM

## 2022-10-26 NOTE — ED Notes (Signed)
Patient has asked for supplies to take a shower    items were supplied to him   special lunch tray was placed due to him not being able to chew due to not having teeth    they stated it would arrive within an hour

## 2022-10-26 NOTE — ED Notes (Signed)
Patient to unit with multiple officers present.  Patient reports he is not suicidal.  Cooperative at this time.  Willing to change into scrubs.
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
## 2022-10-26 NOTE — ED Notes (Signed)
Pt. Was given lunch tray, pt said it was too hot and did not want it and asked for two sausage egg and cheese biscuits. Pt. Was given biscuits.

## 2022-10-26 NOTE — ED Notes (Signed)
Pt. Was given soap, towel, clean scrubs, and rag

## 2022-10-26 NOTE — ED Notes (Signed)
Will give breakfast tray to pt. When they wake up.
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
## 2022-10-26 NOTE — ED Notes (Signed)
Patient has rested quietly through night.  Was calm and cooperative after TTS assessment.

## 2022-10-27 DIAGNOSIS — F1994 Other psychoactive substance use, unspecified with psychoactive substance-induced mood disorder: Secondary | ICD-10-CM

## 2022-10-27 NOTE — ED Provider Notes (Signed)
Emergency Medicine Observation Re-evaluation Note  [name] [name] is a 47 y.o. male, seen on rounds today.  Pt initially presented to the ED for complaints of Drug Overdose and restless Currently, the patient is awake, alert, conversing with the Engineer, materials. Patient has no complaints from his side today.  He wants to go home.  Physical Exam  BP (!) 128/99   Pulse 95   Temp 98 F (36.7 C)   Resp 16   Ht 6\' 1"  (1.854 m)   Wt 97.5 kg   SpO2 99%   BMI 28.37 kg/m  Physical Exam General: No acute distress Cardiac: Regular rate Lungs: No respiratory distress Psych: Currently calm  ED Course / MDM  EKG:EKG Interpretation Date/Time:  Wednesday October 25 2022 19:34:09 EDT Ventricular Rate:  75 PR Interval:  173 QRS Duration:  96 QT Interval:  372 QTC Calculation: 416 R Axis:   10  Text Interpretation: Sinus rhythm ST elev, probable normal early repol pattern When compared with ECG of 10/10/2022, No significant change was found Confirmed by Dione Booze ([id number]) on 10/25/2022 11:14:43 PM  I have reviewed the labs performed to date as well as medications administered while in observation.  Recent changes in the last 24 hours include -patient came in volatile.  Looks like he was involuntarily committed and required sedation, he is currently calm.  Plan  Current plan is for disposition per psychiatry team.  10:09 AM  He was seen by psychiatry team today.  They have cleared him and rescinded the IVC.    Derwood Kaplan, MD 10/27/22 1009

## 2022-10-27 NOTE — Discharge Instructions (Addendum)
Discharge recommendations:  Patient is to take medications as prescribed. Please see information for follow-up appointment with psychiatry and therapy. Please follow up with your primary care provider for all medical related needs.   Therapy: We recommend that patient participate in individual therapy to address mental health concerns.  Medications: The patient or guardian is to contact a medical professional and/or outpatient provider to address any new side effects that develop. The patient or guardian should update outpatient providers of any new medications and/or medication changes.   Atypical antipsychotics: If you are prescribed an atypical antipsychotic, it is recommended that your height, weight, BMI, blood pressure, fasting lipid panel, and fasting blood sugar be monitored by your outpatient providers.  Safety:  The patient should abstain from use of illicit substances/drugs and abuse of any medications. If symptoms worsen or do not continue to improve or if the patient becomes actively suicidal or homicidal then it is recommended that the patient return to the closest hospital emergency department, the [facility], or call 911 for further evaluation and treatment. National Suicide Prevention Lifeline 1-800-SUICIDE or 479 212 9779.  About 988 988 offers 24/7 access to trained crisis counselors who can help people experiencing mental health-related distress. People can call or text 988 or chat 988lifeline.org for themselves or if they are worried about a loved one who may need crisis support.  Crisis Mobile: [facility]:                     [phone] (for crisis response 24 hours a day) [facility] Hotline:                                            939-470-6253   Safety Plan [name] [name] will reach out to his friend [name] [name], call 911 or call mobile crisis, or go to nearest emergency room if condition worsens or if  suicidal thoughts become active Patients' will follow up with behavioral health urgent care for outpatient psychiatric services (therapy/medication management).  The suicide prevention education provided includes the following: Suicide risk factors Suicide prevention and interventions National Suicide Hotline telephone number [facility] assessment telephone number [facility] Emergency Assistance [facility] and/or Residential Mobile Crisis Unit telephone number Request made of family/significant other to:  his friend [name] [name] Remove weapons (e.g., guns, rifles, knives), all items previously/currently identified as safety concern.   Remove drugs/medications (over the counter, prescriptions, illicit drugs), all items previously/currently identified as a safety concern.

## 2022-10-27 NOTE — ED Notes (Signed)
Patient change his mind and let us check his VS. Patient very calm and cooperative. Will continue to monitor.

## 2022-10-27 NOTE — Discharge Summary (Signed)
[facility] Psych ED Discharge  10/27/2022 9:26 AM [name] [name]  MRN:  [id number]   Principal Problem: Substance induced mood disorder [facility]) Discharge Diagnoses: Principal Problem:   Substance induced mood disorder (HCC) Active Problems:   Schizoaffective disorder, bipolar type (HCC)  Clinical Impression:  Final diagnoses:  Suicide attempt (HCC)  Homicidal ideation    ED Assessment Time Calculation: Start Time: 0845 Stop Time: 0900 Total Time in Minutes (Assessment Completion): 15   Subjective: On today's reassessment patient is seen standing at the nurses station. This provider asked if she could speak with patient and he agreed. Patient is appropriate and cooperative, he does engage well with this Clinical research associate. His speech is pressured and garbled due to needing dentures. He currently denies SI/HI/AVH, during this assessment. Hs eye contact was fair. Psychomotor activity was normal. Patient describes his mood as "ok" and his affect is congruent with mood. His thought process was normal and he answered questions appropriately, he did not display any flight of ideas. Associations were intact. There were no overt signs of delusions, paranoia, or responding to internal stimuli. Patient's insight and judgment are poor, chronically due to underlying mental illness. Patient is able to contract for safety outside the hospital. At this time, the patient denies any suicidal ideations, homicidal ideations, auditory hallucinations, visual hallucinations, in addition to denying any paranoia or delusions apparent otherwise. Lastly, patient denies any elevated mood or euphoria that would be concerning for mania. At the conclusion of the evaluation, the patient voiced no other concerns .   Past Psychiatric History: paranoid schizophrenia, substance abuse    Past Medical History:  Past Medical History:  Diagnosis Date   Aggression    Alcohol abuse    Asthma    Gonorrhea 02/09/2020   Grief 06/14/2020   HIV  disease (HCC) 03/11/2019   Homeless    Hypertension    Penile discharge 05/19/2019   Schizophrenia (HCC)    Vaccine counseling 10/24/2022    Past Surgical History:  Procedure Laterality Date   KNEE SURGERY     MOUTH SURGERY     Family History: No family history on file.  Social History:  Social History   Substance and Sexual Activity  Alcohol Use Not Currently     Social History   Substance and Sexual Activity  Drug Use Not Currently   Types: Cocaine, Marijuana, Hydrocodone, MDMA (Ecstacy)   Comment: former; patient states he has been clean since 01/25/20    Social History   Socioeconomic History   Marital status: Married    Spouse name: Not on file   Number of children: Not on file   Years of education: Not on file   Highest education level: Not on file  Occupational History   Not on file  Tobacco Use   Smoking status: Former   Smokeless tobacco: Never  Substance and Sexual Activity   Alcohol use: Not Currently   Drug use: Not Currently    Types: Cocaine, Marijuana, Hydrocodone, MDMA (Ecstacy)    Comment: former; patient states he has been clean since 01/25/20   Sexual activity: Yes    Partners: Female    Birth control/protection: Condom    Comment: "just give me 2 condoms"  Other Topics Concern   Not on file  Social History Narrative   Not on file   Social Determinants of Health   Financial Resource Strain: Not on file  Food Insecurity: Not on file  Transportation Needs: Not on file  Physical Activity: Not on file  Stress: Not on file  Social Connections: Not on file    Tobacco Cessation:  A prescription for an FDA-approved tobacco cessation medication was offered at discharge and the patient refused  Current Medications: Current Facility-Administered Medications  Medication Dose Route Frequency Provider Last Rate Last Admin   acetaminophen (TYLENOL) tablet 650 mg  650 mg Oral Q4H PRN Fayrene Helper, PA-C   650 mg at 10/26/22 2140   alum & mag  hydroxide-simeth (MAALOX/MYLANTA) 200-200-20 MG/5ML suspension 30 mL  30 mL Oral Q6H PRN Fayrene Helper, PA-C       benztropine (COGENTIN) tablet 1 mg  1 mg Oral BID Fayrene Helper, PA-C   1 mg at 10/26/22 2137   bictegravir-emtricitabine-tenofovir AF (BIKTARVY) 50-200-25 MG per tablet 1 tablet  1 tablet Oral Daily Loetta Rough, MD   1 tablet at 10/26/22 1453   chlorproMAZINE (THORAZINE) tablet 100 mg  100 mg Oral Q1500 Fayrene Helper, PA-C   100 mg at 10/26/22 1453   levETIRAcetam (KEPPRA) tablet 500 mg  500 mg Oral BID Fayrene Helper, PA-C   500 mg at 10/26/22 2137   lithium carbonate capsule 900 mg  900 mg Oral QHS Fayrene Helper, PA-C   900 mg at 10/26/22 2137   risperiDONE (RISPERDAL M-TABS) disintegrating tablet 2 mg  2 mg Oral Q8H PRN Fayrene Helper, PA-C   2 mg at 10/26/22 1419   And   LORazepam (ATIVAN) tablet 1 mg  1 mg Oral PRN Fayrene Helper, PA-C       And   ziprasidone (GEODON) injection 20 mg  20 mg Intramuscular PRN Fayrene Helper, PA-C       nicotine (NICODERM CQ - dosed in mg/24 hours) patch 21 mg  21 mg Transdermal Daily Fayrene Helper, PA-C   21 mg at 10/26/22 1417   ondansetron (ZOFRAN) tablet 4 mg  4 mg Oral Q8H PRN Fayrene Helper, PA-C       paliperidone (INVEGA SUSTENNA) injection 234 mg  234 mg Intramuscular Q21 days    234 mg at 09/19/22 1114   paliperidone (INVEGA) 24 hr tablet 3 mg  3 mg Oral QHS Motley-Mangrum, Donjuan Robison A, PMHNP   3 mg at 10/26/22 2137   zolpidem (AMBIEN) tablet 5 mg  5 mg Oral QHS PRN Fayrene Helper, PA-C       Current Outpatient Medications  Medication Sig Dispense Refill   benztropine (COGENTIN) 1 MG tablet Take 1 tablet (1 mg total) by mouth 2 (two) times daily. 60 tablet 3   bictegravir-emtricitabine-tenofovir AF (BIKTARVY) 50-200-25 MG TABS tablet Take 1 tablet by mouth daily.     chlorproMAZINE (THORAZINE) 100 MG tablet TAKE 1 TABLET (100 MG TOTAL) BY MOUTH DAILY IN THE AFTERNOON. 90 tablet 2   INVEGA SUSTENNA 234 MG/1.5ML injection Inject 234 mg into the muscle every 28  (twenty-eight) days.     levETIRAcetam (KEPPRA) 500 MG tablet Take 500 mg by mouth 2 (two) times daily.     lithium carbonate 300 MG capsule Take 3 capsules (900 mg total) by mouth at bedtime. Correct Sig from Pharmacy was 300 mg TID Q day. 90 capsule 3   PTA Medications: (Not in a hospital admission)   [place] Scale:  Flowsheet Row ED from 10/25/2022 in [facility] Emergency Department at [facility] Most recent reading at 10/25/2022 11:14 PM ED from 10/10/2022 in [facility] Most recent reading at 10/10/2022  6:54 AM ED from 10/10/2022 in [facility] Emergency Department at [facility] Most  recent reading at 10/10/2022  2:46 AM  C-SSRS RISK CATEGORY Moderate Risk High Risk No Risk       Musculoskeletal: Strength & Muscle Tone: within normal limits Gait & Station: normal Patient leans: N/A  Psychiatric Specialty Exam: Presentation  General Appearance:  Appropriate for Environment  Eye Contact: Good  Speech: Clear and Coherent; Garbled (needs dentures)  Speech Volume: Normal  Handedness: Right   Mood and Affect  Mood: Euthymic  Affect: Appropriate   Thought Process  Thought Processes: Coherent  Descriptions of Associations:Intact  Orientation:Full (Time, Place and Person)  Thought Content:WDL  History of Schizophrenia/Schizoaffective disorder:Yes  Duration of Psychotic Symptoms:No data recorded Hallucinations:Hallucinations: None  Ideas of Reference:None  Suicidal Thoughts:Suicidal Thoughts: No  Homicidal Thoughts:Homicidal Thoughts: No   Sensorium  Memory: Recent Fair; Immediate Good  Judgment: Fair  Insight: Good   Executive Functions  Concentration: Good  Attention Span: Good  Recall: Good  Fund of Knowledge: Good  Language: Good   Psychomotor Activity  Psychomotor Activity: Psychomotor Activity: Normal   Assets  Assets: Communication Skills; Desire for Improvement;  Social Support; Housing   Sleep  Sleep: Sleep: Good    Physical Exam: Physical Exam Vitals and nursing note reviewed. Exam conducted with a chaperone present.  Neurological:     Mental Status: He is alert.  Psychiatric:        Mood and Affect: Mood normal.        Behavior: Behavior normal.        Thought Content: Thought content normal.    Review of Systems  Constitutional: Negative.   Psychiatric/Behavioral:  Positive for substance abuse.    Blood pressure (!) 128/99, pulse 95, temperature 98 F (36.7 C), resp. rate 16, height 6\' 1"  (1.854 m), weight 97.5 kg, SpO2 99%. Body mass index is 28.37 kg/m.   Demographic Factors:  Male and Low socioeconomic status  Loss Factors: NA  Historical Factors: Impulsivity  Risk Reduction Factors:   Living with another person, especially a relative and Positive social support  Continued Clinical Symptoms:  Schizophrenia:   Paranoid or undifferentiated type   Suicide Risk:  Minimal: No identifiable suicidal ideation.  Patients presenting with no risk factors but with morbid ruminations; may be classified as minimal risk based on the severity of the depressive symptoms   Follow-up Information     [facility] Follow up.   Specialty: Urgent Care Why: Follow up with your outpatient provider Shanna Cisco, NP at  [facility] Contact information: [address] [place] [address] [phone]                Medical Decision Making: Patient is psychiatrically cleared. Patient case review and discussed with Dr. Lucianne Muss, and patient does not meet inpatient criteria for inpatient psychiatric treatment. At time of discharge, patient denies SI, HI, AVH and can contract for safety. He demonstrated no overt evidence of psychosis or mania. Prior to discharge, he verbalized that they understood warning signs, triggers, and symptoms of worsening mental health  and how to access emergency mental health care if they felt it was needed. Patient was instructed to call 911 or return to the emergency room if they experienced any concerning symptoms after discharge. Patient voiced understanding and agreed to the above.  Patient given resources to follow up with behavioral health urgent care for therapy and medication management. Patient denies access to weapons. Safety planning completed.  Patient friend and mentor [name] [name] will come  and pick patient up at discharge. Discussed with patient to follow up with his outpatient provider [name] [name]. Spoke with [name] [name] at [facility] and informed her that patient received his [place] Sus 234 mg. here at [facility] on 10/26/22.   Safety Plan [name] [name] will reach out to his friend [name] [name], call 911 or call mobile crisis, or go to nearest emergency room if condition worsens or if suicidal thoughts become active Patients' will follow up with behavioral health urgent care for outpatient psychiatric services (therapy/medication management).  The suicide prevention education provided includes the following: Suicide risk factors Suicide prevention and interventions National Suicide Hotline telephone number [facility] assessment telephone number [facility] Emergency Assistance 911 [facility] and/or Residential Mobile Crisis Unit telephone number Request made of family/significant other to:  his friend [name] [name] Remove weapons (e.g., guns, rifles, knives), all items previously/currently identified as safety concern.   Remove drugs/medications (over the counter, prescriptions, illicit drugs), all items previously/currently identified as a safety concern.      Disposition: Patient does not meet criteria for psychiatric inpatient admission. Supportive therapy provided about ongoing stressors. Discussed crisis plan, support from social network, calling 911, coming to the  Emergency Department, and calling Suicide Hotline.   Jesenia Spera MOTLEY-MANGRUM, PMHNP 10/27/2022, 9:26 AM

## 2022-10-27 NOTE — ED Notes (Signed)
Patient refused VS at this time.

## 2022-10-27 NOTE — ED Notes (Signed)
Pt given breakfast tray

## 2022-10-27 NOTE — ED Notes (Signed)
Patient remain asleep since last night after he took his medicine. Will continue to monitor.

## 2022-10-29 ENCOUNTER — Emergency Department (HOSPITAL_COMMUNITY)
Admission: EM | Admit: 2022-10-29 | Discharge: 2022-10-29 | Disposition: A | Discharge status: Home / Self Care | Payer: No Typology Code available for payment source | Attending: Emergency Medicine | Admitting: Emergency Medicine

## 2022-10-29 DIAGNOSIS — F209 Schizophrenia, unspecified: Secondary | ICD-10-CM | POA: Insufficient documentation

## 2022-10-29 DIAGNOSIS — R443 Hallucinations, unspecified: Secondary | ICD-10-CM | POA: Diagnosis present

## 2022-10-29 MED ORDER — CHLORPROMAZINE HCL 100 MG PO TABS
100.0000 mg | ORAL_TABLET | Freq: Every day | ORAL | Status: DC
  Administered 2022-10-29: 100 mg via ORAL
  Filled 2022-10-29: qty 1

## 2022-10-29 MED ORDER — LITHIUM CARBONATE 300 MG PO CAPS
900.0000 mg | ORAL_CAPSULE | Freq: Every day | ORAL | Status: DC
  Administered 2022-10-29: 900 mg via ORAL
  Filled 2022-10-29: qty 3

## 2022-10-29 MED ORDER — BENZTROPINE MESYLATE 0.5 MG PO TABS
1.0000 mg | ORAL_TABLET | Freq: Two times a day (BID) | ORAL | Status: DC
  Administered 2022-10-29: 1 mg via ORAL
  Filled 2022-10-29: qty 2

## 2022-10-29 MED ORDER — LEVETIRACETAM 500 MG PO TABS
500.0000 mg | ORAL_TABLET | Freq: Two times a day (BID) | ORAL | Status: DC
  Administered 2022-10-29: 500 mg via ORAL
  Filled 2022-10-29: qty 1

## 2022-10-29 MED ORDER — LITHIUM CARBONATE 300 MG PO CAPS: 900.0000 mg | ORAL_CAPSULE | Freq: Every day | ORAL | Status: DC

## 2022-10-29 NOTE — ED Provider Notes (Signed)
[place] EMERGENCY DEPARTMENT AT [facility] Provider Note   CSN: [id number] Arrival date & time: 10/29/22  1310     History  Chief Complaint  Patient presents with   Abdominal Pain   Hallucinations    [name] [name] is a 47 y.o. male.  Patient presents requesting a dose of his home medications.  States that he has been several days since he has had his medications for his schizophrenia.  He is currently unable to have access to to them because they are at home with his wife.  Denies any new psychiatric complaints.  Chronically has hallucinations.  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  Request to get a dose of his medication here and then he will meet with his wife later to get his other medications.  Has no other complaints at this time.       Home Medications Prior to Admission medications   Medication Sig Start Date End Date Taking? Authorizing Provider  benztropine (COGENTIN) 1 MG tablet Take 1 tablet (1 mg total) by mouth 2 (two) times daily. 09/19/22   Shanna Cisco, NP  bictegravir-emtricitabine-tenofovir AF (BIKTARVY) 50-200-25 MG TABS tablet Take 1 tablet by mouth daily.    [provider]  chlorproMAZINE (THORAZINE) 100 MG tablet TAKE 1 TABLET (100 MG TOTAL) BY MOUTH DAILY IN THE AFTERNOON. 10/16/22   Shanna Cisco, NP  INVEGA SUSTENNA 234 MG/1.5ML injection Inject 234 mg into the muscle every 28 (twenty-eight) days.    [provider]  levETIRAcetam (KEPPRA) 500 MG tablet Take 500 mg by mouth 2 (two) times daily.    [provider]  lithium carbonate 300 MG capsule Take 3 capsules (900 mg total) by mouth at bedtime. Correct Sig from Pharmacy was 300 mg TID Q day. 09/19/22   Shanna Cisco, NP      Allergies    Prunus persica, Sudafed [pseudoephedrine], and Other    Review of Systems   Review of Systems  All other systems reviewed and are negative.   Physical Exam Updated Vital Signs BP (!)  144/99 Comment: nurse notified  Pulse 64   Temp 98.6 F (37 C) (Oral)   Resp 15   SpO2 100%  Physical Exam Vitals and nursing note reviewed.  Constitutional:      General: He is not in acute distress.    Appearance: Normal appearance. He is well-developed. He is not toxic-appearing.  HENT:     Head: Normocephalic and atraumatic.  Eyes:     General: Lids are normal.     Conjunctiva/sclera: Conjunctivae normal.     Pupils: Pupils are equal, round, and reactive to light.  Neck:     Thyroid: No thyroid mass.     Trachea: No tracheal deviation.  Cardiovascular:     Rate and Rhythm: Normal rate and regular rhythm.     Heart sounds: Normal heart sounds. No murmur heard.    No gallop.  Pulmonary:     Effort: Pulmonary effort is normal. No respiratory distress.     Breath sounds: Normal breath sounds. No stridor. No decreased breath sounds, wheezing, rhonchi or rales.  Abdominal:     General: There is no distension.     Palpations: Abdomen is soft.     Tenderness: There is no abdominal tenderness. There is no rebound.  Musculoskeletal:        General: No tenderness. Normal range of motion.     Cervical back: Normal range of motion and neck  supple.  Skin:    General: Skin is warm and dry.     Findings: No abrasion or rash.  Neurological:     Mental Status: He is alert and oriented to person, place, and time. Mental status is at baseline.     GCS: GCS eye subscore is 4. GCS verbal subscore is 5. GCS motor subscore is 6.     Cranial Nerves: Cranial nerves are intact. No cranial nerve deficit.     Sensory: No sensory deficit.     Motor: Motor function is intact.  Psychiatric:        Attention and Perception: Attention normal.        Speech: Speech normal.        Behavior: Behavior normal.        Thought Content: Thought content is not paranoid. Thought content does not include homicidal or suicidal ideation.     ED Results / Procedures / Treatments   Labs (all labs ordered are  listed, but only abnormal results are displayed) Labs Reviewed - No data to display  EKG None  Radiology No results found.  Procedures Procedures    Medications Ordered in ED Medications  chlorproMAZINE (THORAZINE) tablet 100 mg (has no administration in time range)  levETIRAcetam (KEPPRA) tablet 500 mg (has no administration in time range)  lithium carbonate capsule 900 mg (has no administration in time range)  benztropine (COGENTIN) tablet 1 mg (has no administration in time range)    ED Course/ Medical Decision Making/ A&P                                 Medical Decision Making Risk Prescription drug management.   Patient given the medications he requested.  Feels fine and will discharge home        Final Clinical Impression(s) / ED Diagnoses Final diagnoses:  None    Rx / DC Orders ED Discharge Orders     None         Lorre Nick, MD 10/29/22 1442

## 2022-10-29 NOTE — ED Notes (Signed)
Pt states he has taken his shot medication, but needs his pills to stay calm.

## 2022-10-29 NOTE — ED Triage Notes (Signed)
BIBA in c/o visual and audio hallucinations, abd pain, and poor intake.  Hx of schizophrenia.

## 2022-10-31 ENCOUNTER — Ambulatory Visit (HOSPITAL_COMMUNITY): Payer: MEDICAID

## 2022-11-06 ENCOUNTER — Other Ambulatory Visit: Payer: Self-pay

## 2022-11-06 ENCOUNTER — Encounter (HOSPITAL_COMMUNITY): Payer: Self-pay | Admitting: Emergency Medicine

## 2022-11-06 ENCOUNTER — Emergency Department (HOSPITAL_COMMUNITY)
Admission: EM | Admit: 2022-11-06 | Discharge: 2022-11-07 | Disposition: A | Discharge status: Home / Self Care | Payer: PRIVATE HEALTH INSURANCE | Attending: Emergency Medicine | Admitting: Emergency Medicine

## 2022-11-06 DIAGNOSIS — B20 Human immunodeficiency virus [HIV] disease: Secondary | ICD-10-CM | POA: Insufficient documentation

## 2022-11-06 DIAGNOSIS — R112 Nausea with vomiting, unspecified: Secondary | ICD-10-CM | POA: Diagnosis present

## 2022-11-06 DIAGNOSIS — J45909 Unspecified asthma, uncomplicated: Secondary | ICD-10-CM | POA: Insufficient documentation

## 2022-11-06 DIAGNOSIS — I10 Essential (primary) hypertension: Secondary | ICD-10-CM | POA: Insufficient documentation

## 2022-11-06 DIAGNOSIS — Z1152 Encounter for screening for COVID-19: Secondary | ICD-10-CM | POA: Insufficient documentation

## 2022-11-06 DIAGNOSIS — R519 Headache, unspecified: Secondary | ICD-10-CM | POA: Diagnosis not present

## 2022-11-06 DIAGNOSIS — F1410 Cocaine abuse, uncomplicated: Secondary | ICD-10-CM | POA: Diagnosis not present

## 2022-11-06 LAB — COMPREHENSIVE METABOLIC PANEL
ALT: 19 U/L (ref 0–44)
AST: 18 U/L (ref 15–41)
Albumin: 4.3 g/dL (ref 3.5–5.0)
Alkaline Phosphatase: 75 U/L (ref 38–126)
Anion gap: 7 (ref 5–15)
BUN: 11 mg/dL (ref 6–20)
CO2: 26 mmol/L (ref 22–32)
Calcium: 9.8 mg/dL (ref 8.9–10.3)
Chloride: 107 mmol/L (ref 98–111)
Creatinine, Ser: 1.02 mg/dL (ref 0.61–1.24)
GFR, Estimated: >60 mL/min (ref >60)
Glucose, Bld: 117 mg/dL — ABNORMAL HIGH (ref 70–99)
Potassium: 3.8 mmol/L (ref 3.5–5.1)
Sodium: 140 mmol/L (ref 135–145)
Total Bilirubin: 0.9 mg/dL (ref 0.3–1.2)
Total Protein: 7.9 g/dL (ref 6.5–8.1)

## 2022-11-06 LAB — CBC
HCT: 46.2 % (ref 39.0–52.0)
Hemoglobin: 15.2 g/dL (ref 13.0–17.0)
MCH: 31.7 pg (ref 26.0–34.0)
MCHC: 32.9 g/dL (ref 30.0–36.0)
MCV: 96.5 fL (ref 80.0–100.0)
Platelets: 200 10*3/uL (ref 150–400)
RBC: 4.79 MIL/uL (ref 4.22–5.81)
RDW: 11.3 % — ABNORMAL LOW (ref 11.5–15.5)
WBC: 7.2 10*3/uL (ref 4.0–10.5)
nRBC: 0 % (ref 0.0–0.2)

## 2022-11-06 LAB — LIPASE, BLOOD: Lipase: 25 U/L (ref 11–51)

## 2022-11-06 NOTE — ED Triage Notes (Signed)
Pt c/o emesis and headache that started this morning.

## 2022-11-07 ENCOUNTER — Ambulatory Visit (HOSPITAL_COMMUNITY): Payer: MEDICAID

## 2022-11-07 DIAGNOSIS — R112 Nausea with vomiting, unspecified: Secondary | ICD-10-CM | POA: Diagnosis not present

## 2022-11-07 LAB — URINALYSIS, ROUTINE W REFLEX MICROSCOPIC
Bacteria, UA: NONE SEEN
Bilirubin Urine: NEGATIVE
Glucose, UA: NEGATIVE mg/dL
Hgb urine dipstick: NEGATIVE
Ketones, ur: NEGATIVE mg/dL
Leukocytes,Ua: NEGATIVE
Nitrite: NEGATIVE
Protein, ur: NEGATIVE mg/dL
Specific Gravity, Urine: 1.009 (ref 1.005–1.030)
pH: 8 (ref 5.0–8.0)

## 2022-11-07 LAB — RESP PANEL BY RT-PCR (RSV, FLU A&B, COVID)  RVPGX2
Influenza A by PCR: NEGATIVE
Influenza B by PCR: NEGATIVE
Resp Syncytial Virus by PCR: NEGATIVE
SARS Coronavirus 2 by RT PCR: NEGATIVE

## 2022-11-07 MED ORDER — METOCLOPRAMIDE HCL 5 MG/ML IJ SOLN
10.0000 mg | Freq: Once | INTRAMUSCULAR | Status: AC
  Administered 2022-11-07: 10 mg via INTRAVENOUS
  Filled 2022-11-07: qty 2

## 2022-11-07 MED ORDER — SODIUM CHLORIDE 0.9 % IV BOLUS
1000.0000 mL | Freq: Once | INTRAVENOUS | Status: AC
  Administered 2022-11-07: 1000 mL via INTRAVENOUS

## 2022-11-07 MED ORDER — ONDANSETRON HCL 4 MG/2ML IJ SOLN
4.0000 mg | Freq: Once | INTRAMUSCULAR | Status: AC
  Administered 2022-11-07: 4 mg via INTRAVENOUS
  Filled 2022-11-07: qty 2

## 2022-11-07 MED ORDER — ALUM & MAG HYDROXIDE-SIMETH 200-200-20 MG/5ML PO SUSP
30.0000 mL | Freq: Once | ORAL | Status: AC
  Administered 2022-11-07: 30 mL via ORAL
  Filled 2022-11-07: qty 30

## 2022-11-07 MED ORDER — ONDANSETRON 4 MG PO TBDP: 4.0000 mg | ORAL_TABLET | Freq: Three times a day (TID) | ORAL | 0 refills | Status: AC | PRN

## 2022-11-07 MED ORDER — LIDOCAINE VISCOUS HCL 2 % MT SOLN
15.0000 mL | Freq: Once | OROMUCOSAL | Status: AC
  Administered 2022-11-07: 15 mL via ORAL
  Filled 2022-11-07: qty 15

## 2022-11-07 NOTE — ED Provider Notes (Signed)
[place] EMERGENCY DEPARTMENT AT [facility] Provider Note  CSN: [id number] Arrival date & time: 11/06/22 2057  Chief Complaint(s) Emesis  HPI [name] [name] is a 47 y.o. male with a past medical history listed below who presents to the emergency department with mild headache with nausea and nonbloody nonbilious emesis that started earlier today.  No fevers or chills.  No coughing or congestion.  No abdominal pain.  No diarrhea.  No urinary symptoms.  No known sick contacts.  The history is provided by the patient.    Past Medical History Past Medical History:  Diagnosis Date   Aggression    Alcohol abuse    Asthma    Gonorrhea 02/09/2020   Grief 06/14/2020   HIV disease (HCC) 03/11/2019   Homeless    Hypertension    Penile discharge 05/19/2019   Schizophrenia (HCC)    Vaccine counseling 10/24/2022   Patient Active Problem List   Diagnosis Date Noted   Vaccine counseling 10/24/2022   Substance induced mood disorder (HCC) 10/10/2022   Visual hallucinations 10/10/2022   Encounter for medication management 10/10/2022   Grief 06/14/2020   Gonorrhea 02/09/2020   Penile discharge 05/19/2019   HIV disease (HCC) 03/11/2019   Cocaine abuse (HCC) 10/04/2015   Suicidal ideation 09/21/2015   Cannabis use disorder, severe, dependence (HCC) 06/18/2015   Behavior disturbance    Schizoaffective disorder, bipolar type (HCC) 04/28/2015   Adult antisocial behavior 04/04/2015   Polysubstance abuse (HCC) 04/04/2015   Homicidal ideation 10/16/2014   Obstructive sleep apnea 10/16/2014   Home Medication(s) Prior to Admission medications   Medication Sig Start Date End Date Taking? Authorizing Provider  ondansetron (ZOFRAN-ODT) 4 MG disintegrating tablet Take 1 tablet (4 mg total) by mouth every 8 (eight) hours as needed for up to 3 days for nausea or vomiting. 11/07/22 11/10/22 Yes Suvi Archuletta, Amadeo Garnet, MD  benztropine (COGENTIN) 1 MG tablet Take 1 tablet (1 mg total) by  mouth 2 (two) times daily. 09/19/22   Shanna Cisco, NP  bictegravir-emtricitabine-tenofovir AF (BIKTARVY) 50-200-25 MG TABS tablet Take 1 tablet by mouth daily.    [provider]  chlorproMAZINE (THORAZINE) 100 MG tablet TAKE 1 TABLET (100 MG TOTAL) BY MOUTH DAILY IN THE AFTERNOON. 10/16/22   Shanna Cisco, NP  INVEGA SUSTENNA 234 MG/1.5ML injection Inject 234 mg into the muscle every 28 (twenty-eight) days.    [provider]  levETIRAcetam (KEPPRA) 500 MG tablet Take 500 mg by mouth 2 (two) times daily.    [provider]  lithium carbonate 300 MG capsule Take 3 capsules (900 mg total) by mouth at bedtime. Correct Sig from Pharmacy was 300 mg TID Q day. 09/19/22   Shanna Cisco, NP                                                                                                                                    Allergies  Prunus persica, Sudafed [pseudoephedrine], and Other  Review of Systems Review of Systems As noted in HPI  Physical Exam Vital Signs  I have reviewed the triage vital signs BP (!) 150/87   Pulse 78   Temp 98.8 F (37.1 C)   Resp 18   Ht 6\' 1"  (1.854 m)   Wt 97.5 kg   SpO2 98%   BMI 28.36 kg/m   Physical Exam Vitals reviewed.  Constitutional:      General: He is not in acute distress.    Appearance: He is well-developed. He is not diaphoretic.  HENT:     Head: Normocephalic and atraumatic.     Right Ear: External ear normal.     Left Ear: External ear normal.     Nose: Nose normal.     Mouth/Throat:     Mouth: Mucous membranes are moist.  Eyes:     General: No scleral icterus.    Conjunctiva/sclera: Conjunctivae normal.  Neck:     Trachea: Phonation normal.  Cardiovascular:     Rate and Rhythm: Normal rate and regular rhythm.  Pulmonary:     Effort: Pulmonary effort is normal. No respiratory distress.     Breath sounds: No stridor.  Abdominal:     General: There is no distension.     Tenderness: There is  no abdominal tenderness.  Musculoskeletal:        General: Normal range of motion.     Cervical back: Normal range of motion.  Neurological:     Mental Status: He is alert and oriented to person, place, and time.  Psychiatric:        Behavior: Behavior normal.     ED Results and Treatments Labs (all labs ordered are listed, but only abnormal results are displayed) Labs Reviewed  COMPREHENSIVE METABOLIC PANEL - Abnormal; Notable for the following components:      Result Value   Glucose, Bld 117 (*)    All other components within normal limits  CBC - Abnormal; Notable for the following components:   RDW 11.3 (*)    All other components within normal limits  RESP PANEL BY RT-PCR (RSV, FLU A&B, COVID)  RVPGX2  LIPASE, BLOOD  URINALYSIS, ROUTINE W REFLEX MICROSCOPIC                                                                                                                         EKG  EKG Interpretation Date/Time:    Ventricular Rate:    PR Interval:    QRS Duration:    QT Interval:    QTC Calculation:   R Axis:      Text Interpretation:         Radiology No results found.  Medications Ordered in ED Medications  metoCLOPramide (REGLAN) injection 10 mg (10 mg Intravenous Given 11/07/22 0158)  sodium chloride 0.9 % bolus 1,000 mL (0 mLs Intravenous Stopped 11/07/22 0522)  ondansetron (ZOFRAN) injection 4 mg (4 mg Intravenous Given 11/07/22  2841)  alum & mag hydroxide-simeth (MAALOX/MYLANTA) 200-200-20 MG/5ML suspension 30 mL (30 mLs Oral Given 11/07/22 0418)    And  lidocaine (XYLOCAINE) 2 % viscous mouth solution 15 mL (15 mLs Oral Given 11/07/22 0418)   Procedures Procedures  (including critical care time) Medical Decision Making / ED Course   Medical Decision Making Amount and/or Complexity of Data Reviewed Labs: ordered.  Risk OTC drugs. Prescription drug management.    47 y.o. male presents with  headache with n/v.  No possible suspicious food intake.   Decreased oral tolerance. Rest of history as above.  Patient appears well, not in distress, and with no signs of toxicity or dehydration. Abdomen benign.  Rest of the exam as above  CBC without leukocytosis or anemia Metabolic panel without significant electrolyte derangements or renal sufficiency No evidence of bili obstruction or pancreatitis UA without evidence of infection Viral panel ruled out COVID, influenza, RSV.  Most consistent with viral gastroenteritis.   Doubt appendicitis, diverticulitis, severe colitis, meningitis.  Provided with IV fluids and antiemetics.  Also given GI cocktail. Able to tolerate oral intake in the ED.  Discussed symptomatic treatment with the patient and they will follow closely with their PCP.     Final Clinical Impression(s) / ED Diagnoses Final diagnoses:  Nausea and vomiting in adult   The patient appears reasonably screened and/or stabilized for discharge and I doubt any other medical condition or other [facility] requiring further screening, evaluation, or treatment in the ED at this time. I have discussed the findings, Dx and Tx plan with the patient/family who expressed understanding and agree(s) with the plan. Discharge instructions discussed at length. The patient/family was given strict return precautions who verbalized understanding of the instructions. No further questions at time of discharge.  Disposition: Discharge  Condition: Good  ED Discharge Orders          Ordered    ondansetron (ZOFRAN-ODT) 4 MG disintegrating tablet  Every 8 hours PRN        11/07/22 0601              Follow Up: Fleet Contras, MD [address] [place] [address] [phone]  Call  to schedule an appointment for close follow up    This chart was dictated using voice recognition software.  Despite best efforts to proofread,  errors can occur which can change the documentation meaning.    Nira Conn, MD 11/07/22 [phone]

## 2022-11-09 ENCOUNTER — Encounter: Payer: Self-pay | Admitting: Physician Assistant

## 2022-11-09 ENCOUNTER — Ambulatory Visit: Payer: No Typology Code available for payment source | Admitting: Physician Assistant

## 2022-11-09 VITALS — BP 132/86 | HR 91 | Ht 73.0 in | Wt 196.0 lb

## 2022-11-09 DIAGNOSIS — B20 Human immunodeficiency virus [HIV] disease: Secondary | ICD-10-CM | POA: Diagnosis not present

## 2022-11-09 DIAGNOSIS — F17210 Nicotine dependence, cigarettes, uncomplicated: Secondary | ICD-10-CM

## 2022-11-09 NOTE — Patient Instructions (Signed)
For your refill of Biktarvy, you will need to follow-up with infectious disease.  You can reach them at  [address], [place], [place] [address] Open ? Closes 5?PM Phone: [phone]  Roney Jaffe, PA-C Physician Assistant [facility] [email or web address]   Health Maintenance, Male Adopting a healthy lifestyle and getting preventive care are important in promoting health and wellness. Ask your health care provider about: The right schedule for you to have regular tests and exams. Things you can do on your own to prevent diseases and keep yourself healthy. What should I know about diet, weight, and exercise? Eat a healthy diet  Eat a diet that includes plenty of vegetables, fruits, low-fat dairy products, and lean protein. Do not eat a lot of foods that are high in solid fats, added sugars, or sodium. Maintain a healthy weight Body mass index (BMI) is a measurement that can be used to identify possible weight problems. It estimates body fat based on height and weight. Your health care provider can help determine your BMI and help you achieve or maintain a healthy weight. Get regular exercise Get regular exercise. This is one of the most important things you can do for your health. Most adults should: Exercise for at least 150 minutes each week. The exercise should increase your heart rate and make you sweat (moderate-intensity exercise). Do strengthening exercises at least twice a week. This is in addition to the moderate-intensity exercise. Spend less time sitting. Even light physical activity can be beneficial. Watch cholesterol and blood lipids Have your blood tested for lipids and cholesterol at 46 years of age, then have this test every 5 years. You may need to have your cholesterol levels checked more often if: Your lipid or cholesterol levels are high. You are older than 46 years of age. You are at high risk for  heart disease. What should I know about cancer screening? Many types of cancers can be detected early and may often be prevented. Depending on your health history and family history, you may need to have cancer screening at various ages. This may include screening for: Colorectal cancer. Prostate cancer. Skin cancer. Lung cancer. What should I know about heart disease, diabetes, and high blood pressure? Blood pressure and heart disease High blood pressure causes heart disease and increases the risk of stroke. This is more likely to develop in people who have high blood pressure readings or are overweight. Talk with your health care provider about your target blood pressure readings. Have your blood pressure checked: Every 3-5 years if you are 92-79 years of age. Every year if you are 70 years old or older. If you are between the ages of 100 and 73 and are a current or former smoker, ask your health care provider if you should have a one-time screening for abdominal aortic aneurysm (AAA). Diabetes Have regular diabetes screenings. This checks your fasting blood sugar level. Have the screening done: Once every three years after age 71 if you are at a normal weight and have a low risk for diabetes. More often and at a younger age if you are overweight or have a high risk for diabetes. What should I know about preventing infection? Hepatitis B If you have a higher risk for hepatitis B, you should be screened for this virus. Talk with your health care provider to find out if you are at risk for hepatitis B infection. Hepatitis C Blood testing is recommended for: Everyone born from [birth year]  through 1964. Anyone with known risk factors for hepatitis C. Sexually transmitted infections (STIs) You should be screened each year for STIs, including gonorrhea and chlamydia, if: You are sexually active and are younger than 46 years of age. You are older than 46 years of age and your health care provider  tells you that you are at risk for this type of infection. Your sexual activity has changed since you were last screened, and you are at increased risk for chlamydia or gonorrhea. Ask your health care provider if you are at risk. Ask your health care provider about whether you are at high risk for HIV. Your health care provider may recommend a prescription medicine to help prevent HIV infection. If you choose to take medicine to prevent HIV, you should first get tested for HIV. You should then be tested every 3 months for as long as you are taking the medicine. Follow these instructions at home: Alcohol use Do not drink alcohol if your health care provider tells you not to drink. If you drink alcohol: Limit how much you have to 0-2 drinks a day. Know how much alcohol is in your drink. In the [place] one drink equals one 12 oz bottle of beer (355 mL), one 5 oz glass of wine (148 mL), or one 1 oz glass of hard liquor (44 mL). Lifestyle Do not use any products that contain nicotine or tobacco. These products include cigarettes, chewing tobacco, and vaping devices, such as e-cigarettes. If you need help quitting, ask your health care provider. Do not use street drugs. Do not share needles. Ask your health care provider for help if you need support or information about quitting drugs. General instructions Schedule regular health, dental, and eye exams. Stay current with your vaccines. Tell your health care provider if: You often feel depressed. You have ever been abused or do not feel safe at home. Summary Adopting a healthy lifestyle and getting preventive care are important in promoting health and wellness. Follow your health care provider's instructions about healthy diet, exercising, and getting tested or screened for diseases. Follow your health care provider's instructions on monitoring your cholesterol and blood pressure. This information is not intended to replace advice given to you by  your health care provider. Make sure you discuss any questions you have with your health care provider. Document Revised: 06/28/2020 Document Reviewed: 06/28/2020 Elsevier Patient Education  2024 [employer].

## 2022-11-09 NOTE — Progress Notes (Signed)
New Patient Office Visit  Subjective    Patient ID: [name] [name], male    DOB: [birth year]  Age: 47 y.o. MRN: [id number]  CC:  Chief Complaint  Patient presents with   Medication Refill    HPI [name] [name] request a refill of Biktarvy.  States that he has been out of this medication for "some time".  States that he has not been able to follow-up with infectious disease.      Outpatient Encounter Medications as of 11/09/2022  Medication Sig   benztropine (COGENTIN) 1 MG tablet Take 1 tablet (1 mg total) by mouth 2 (two) times daily.   bictegravir-emtricitabine-tenofovir AF (BIKTARVY) 50-200-25 MG TABS tablet Take 1 tablet by mouth daily.   chlorproMAZINE (THORAZINE) 100 MG tablet TAKE 1 TABLET (100 MG TOTAL) BY MOUTH DAILY IN THE AFTERNOON.   INVEGA SUSTENNA 234 MG/1.5ML injection Inject 234 mg into the muscle every 28 (twenty-eight) days.   levETIRAcetam (KEPPRA) 500 MG tablet Take 500 mg by mouth 2 (two) times daily.   lithium carbonate 300 MG capsule Take 3 capsules (900 mg total) by mouth at bedtime. Correct Sig from Pharmacy was 300 mg TID Q day.   ondansetron (ZOFRAN-ODT) 4 MG disintegrating tablet Take 1 tablet (4 mg total) by mouth every 8 (eight) hours as needed for up to 3 days for nausea or vomiting.   Facility-Administered Encounter Medications as of 11/09/2022  Medication   paliperidone (INVEGA SUSTENNA) injection 234 mg    Past Medical History:  Diagnosis Date   Aggression    Alcohol abuse    Asthma    Gonorrhea 02/09/2020   Grief 06/14/2020   HIV disease (HCC) 03/11/2019   Homeless    Hypertension    Penile discharge 05/19/2019   Schizophrenia (HCC)    Vaccine counseling 10/24/2022    Past Surgical History:  Procedure Laterality Date   KNEE SURGERY     MOUTH SURGERY      History reviewed. No pertinent family history.  Social History   Socioeconomic History   Marital status: Married    Spouse name: Not on file   Number of children:  Not on file   Years of education: Not on file   Highest education level: Not on file  Occupational History   Not on file  Tobacco Use   Smoking status: Every Day    Types: Cigarettes   Smokeless tobacco: Never  Substance and Sexual Activity   Alcohol use: Not Currently   Drug use: Not Currently    Types: Cocaine, Marijuana, Hydrocodone, MDMA (Ecstacy)    Comment: former; patient states he has been clean since 01/25/20   Sexual activity: Yes    Partners: Female    Birth control/protection: Condom    Comment: "just give me 2 condoms"  Other Topics Concern   Not on file  Social History Narrative   Not on file   Social Determinants of Health   Financial Resource Strain: Not on file  Food Insecurity: Not on file  Transportation Needs: Not on file  Physical Activity: Not on file  Stress: Not on file  Social Connections: Not on file  Intimate Partner Violence: Not on file    Review of Systems  Constitutional: Negative.   HENT: Negative.    Eyes: Negative.   Respiratory: Negative.    Cardiovascular: Negative.   Gastrointestinal: Negative.   Genitourinary: Negative.   Musculoskeletal: Negative.   Skin: Negative.   Neurological: Negative.   Endo/Heme/Allergies: Negative.   Psychiatric/Behavioral:  Negative.          Objective    BP 132/86 (BP Location: Left Arm, Patient Position: Sitting, Cuff Size: Large)   Pulse 91   Ht 6\' 1"  (1.854 m)   Wt 196 lb (88.9 kg)   SpO2 95%   BMI 25.86 kg/m   Physical Exam Vitals and nursing note reviewed.  Constitutional:      Appearance: Normal appearance.  HENT:     Head: Normocephalic and atraumatic.     Right Ear: External ear normal.     Left Ear: External ear normal.     Nose: Nose normal.     Mouth/Throat:     Mouth: Mucous membranes are moist.     Pharynx: Oropharynx is clear.  Eyes:     Extraocular Movements: Extraocular movements intact.     Conjunctiva/sclera: Conjunctivae normal.     Pupils: Pupils are equal,  round, and reactive to light.  Cardiovascular:     Rate and Rhythm: Normal rate and regular rhythm.     Pulses: Normal pulses.     Heart sounds: Normal heart sounds.  Pulmonary:     Effort: Pulmonary effort is normal.     Breath sounds: Normal breath sounds.  Musculoskeletal:        General: Normal range of motion.     Cervical back: Normal range of motion and neck supple.  Skin:    General: Skin is warm and dry.  Neurological:     General: No focal deficit present.     Mental Status: He is alert and oriented to person, place, and time.  Psychiatric:        Mood and Affect: Mood normal.        Behavior: Behavior normal.        Thought Content: Thought content normal.        Judgment: Judgment normal.        Assessment & Plan:   Problem List Items Addressed This Visit       Other   HIV disease (HCC) - Primary   1. HIV disease [facility]) Patient given information to follow-up with infectious disease.  Patient understands and agrees.  Return if symptoms worsen or fail to improve.   [name] Knudsen Mayers, PA-C

## 2022-11-16 ENCOUNTER — Ambulatory Visit (HOSPITAL_COMMUNITY): Payer: MEDICAID

## 2022-11-21 ENCOUNTER — Ambulatory Visit (HOSPITAL_COMMUNITY): Payer: MEDICAID

## 2022-11-21 ENCOUNTER — Ambulatory Visit: Payer: PRIVATE HEALTH INSURANCE | Admitting: Family

## 2022-12-05 ENCOUNTER — Ambulatory Visit (HOSPITAL_COMMUNITY): Payer: MEDICAID

## 2022-12-07 ENCOUNTER — Ambulatory Visit (HOSPITAL_COMMUNITY): Payer: MEDICAID | Admitting: Psychiatry

## 2022-12-07 ENCOUNTER — Telehealth (HOSPITAL_COMMUNITY): Payer: Self-pay

## 2022-12-07 ENCOUNTER — Ambulatory Visit (HOSPITAL_COMMUNITY): Payer: MEDICAID

## 2022-12-07 ENCOUNTER — Encounter (HOSPITAL_COMMUNITY): Payer: Self-pay

## 2022-12-07 VITALS — BP 140/93 | HR 71 | Temp 97.8°F | Ht 69.0 in | Wt 196.2 lb

## 2022-12-07 DIAGNOSIS — F1290 Cannabis use, unspecified, uncomplicated: Secondary | ICD-10-CM

## 2022-12-07 DIAGNOSIS — F250 Schizoaffective disorder, bipolar type: Secondary | ICD-10-CM

## 2022-12-07 DIAGNOSIS — F1410 Cocaine abuse, uncomplicated: Secondary | ICD-10-CM | POA: Diagnosis not present

## 2022-12-07 MED ORDER — PALIPERIDONE PALMITATE ER 156 MG/ML IM SUSY
156.0000 mg | PREFILLED_SYRINGE | Freq: Once | INTRAMUSCULAR | Status: DC
Start: 2022-12-07 — End: 2023-01-02

## 2022-12-07 MED ORDER — INVEGA SUSTENNA 156 MG/ML IM SUSY
156.0000 mg | PREFILLED_SYRINGE | INTRAMUSCULAR | 11 refills | Status: DC
Start: 2022-12-07 — End: 2023-01-02

## 2022-12-07 MED ORDER — DIVALPROEX SODIUM 500 MG PO DR TAB
500.0000 mg | DELAYED_RELEASE_TABLET | Freq: Every evening | ORAL | 3 refills | Status: DC
Start: 2022-12-07 — End: 2023-01-02

## 2022-12-07 MED ORDER — BENZTROPINE MESYLATE 1 MG PO TABS
1.0000 mg | ORAL_TABLET | Freq: Two times a day (BID) | ORAL | 3 refills | Status: DC
Start: 2022-12-07 — End: 2023-01-02

## 2022-12-07 MED ORDER — PALIPERIDONE ER 6 MG PO TB24
6.0000 mg | ORAL_TABLET | Freq: Every morning | ORAL | 3 refills | Status: DC
Start: 2022-12-07 — End: 2023-01-02

## 2022-12-07 NOTE — Progress Notes (Signed)
BH MD/PA/NP OP Progress Note  12/07/2022 1:19 PM [name] [name]  MRN:  [id number]  Chief Complaint:  No chief complaint on file.  HPI: 47 year old male seen today for follow up psychiatric evaluation.  He has a psychiatric history of Mild intellectual disability,schizoaffective disorder bipolar type, suicidal ideations, chronic homicidal ideations and Polysubstance abuse (cannabis use, cocaine use).  Patient was admitted to [facility] on 11/20/2022-11/23/2022 and his medications were adjusted.  Provider called [facility] and requested patient records.  Per records patient received Invega 156 mg on 11/23/2022.  He was also discharged on Klonopin 0.5 mg twice daily, Depakote ER 500 mg at bedtime, and Invega 6 mg in the morning.  He notes that he has not had his medication since his discharge a few weeks ago.  Today he was well-groomed, pleasant, cooperative, and engaged in conversation.  Patient's speech is less slurred than his prior visit. He reports that he since being released from [facility]  he has been without his medications.  He informed [name] [name] that he is experiencing symptoms of psychosis and reports that sees and hears his deceased brothers telling him to do good things.  He also notes that he continues to use marijuana and cocaine.  Patient notes that he last used these substances 3 days ago.  Patient informed [name] [name] that he has been doing impulsive things.  He notes that he stole his wife credit cards and money.  He also describes at times being increasingly irritable, having fluctuations in mood, and racing thoughts.  Patient notes that he was paranoid that someone is out to kill him.    Since his last visit he informed writer that he continues to be anxious and depressed.  Today provider conducted a GAD-7 and patient scored a 20.  Provider also conducted PHQ-9 and patient scored an 11. He endorses having adequate appetite and sleep.  Patient endorses passive SI but denies wanting to  harm himself.   Patient notes that recently he got his dentures and notes that he is looking forward to eating.  Patient notes that he has pending charges and is hopeful that he will not return to prison. He notes that he and his wife wants to travel to [facility].   Today provider informed patient that Klonopin would not be reordered. Provider reordered cogentin 1 mg twice daily. Per record review the patient was referred to [facility] for his substance use.  Patient notes that ever that he does not feel that he go to [facility].  He will continue other medications as prescribed.   No other concerns noted at this time.   Visit Diagnosis:    ICD-10-CM   1. Cocaine abuse (HCC)  F14.10     2. Schizoaffective disorder, bipolar type (HCC)  F25.0 paliperidone (INVEGA SUSTENNA) injection 156 mg    paliperidone (INVEGA SUSTENNA) 156 MG/ML SUSY injection    divalproex (DEPAKOTE) 500 MG DR tablet    paliperidone (INVEGA) 6 MG 24 hr tablet    benztropine (COGENTIN) 1 MG tablet    3. Marijuana use  F12.90        Past Psychiatric History: Mild intellectual disability,schizoaffective disorder bipolar type, suicidal ideations, chronic homicidal ideations and Polysubstance abuse (cannabis use, cocaine use)   Past Medical History:  Past Medical History:  Diagnosis Date   Aggression    Alcohol abuse    Asthma    Gonorrhea 02/09/2020   Grief 06/14/2020   HIV disease (HCC) 03/11/2019   Homeless    Hypertension  Penile discharge 05/19/2019   Schizophrenia (HCC)    Vaccine counseling 10/24/2022    Past Surgical History:  Procedure Laterality Date   KNEE SURGERY     MOUTH SURGERY      Family Psychiatric History: Reports that his maternal family has issues with substance use. Mental health conditions   Family History: History reviewed. No pertinent family history.  Social History:  Social History   Socioeconomic History   Marital status: Married    Spouse name: Not on file   Number of children:  Not on file   Years of education: Not on file   Highest education level: Not on file  Occupational History   Not on file  Tobacco Use   Smoking status: Every Day    Types: Cigarettes   Smokeless tobacco: Never  Substance and Sexual Activity   Alcohol use: Not Currently   Drug use: Not Currently    Types: Cocaine, Marijuana, Hydrocodone, MDMA (Ecstacy)    Comment: former; patient states he has been clean since 01/25/20   Sexual activity: Yes    Partners: Female    Birth control/protection: Condom    Comment: "just give me 2 condoms"  Other Topics Concern   Not on file  Social History Narrative   Not on file   Social Determinants of Health   Financial Resource Strain: Not on file  Food Insecurity: Not on file  Transportation Needs: Not on file  Physical Activity: Not on file  Stress: Not on file  Social Connections: Not on file    Allergies:  Allergies  Allergen Reactions   Prunus Persica Swelling and Other (See Comments)    "Peaches"   Sudafed [Pseudoephedrine] Anaphylaxis, Swelling and Rash   Other Itching and Other (See Comments)    Pollen- "closes my throat" and causes itchy eyes & a runny nose    Metabolic Disorder Labs:  Lab Results  Component Value Date   PROLACTIN CANCELED 09/26/2022   Lab Results  Component Value Date   CHOL CANCELED 09/26/2022   TRIG CANCELED 09/26/2022   HDL CANCELED 09/26/2022   CHOLHDL 4.4 10/31/2021   VLDL 37 03/14/2009   LDLCALC 83 10/31/2021   LDLCALC 111 (H) 10/10/2021   Lab Results  Component Value Date   TSH CANCELED 09/26/2022   TSH 1.62 10/31/2021    Therapeutic Level Labs: Lab Results  Component Value Date   LITHIUM 0.09 (L) 10/25/2022   LITHIUM 0.09 (L) 10/10/2022   Lab Results  Component Value Date   VALPROATE <10 (L) 02/23/2015   VALPROATE <10.0 (L) 12/10/2010   No results found for: "CBMZ"  Current Medications: Current Outpatient Medications  Medication Sig Dispense Refill   divalproex (DEPAKOTE)  500 MG DR tablet Take 1 tablet (500 mg total) by mouth at bedtime. 30 tablet 3   paliperidone (INVEGA SUSTENNA) 156 MG/ML SUSY injection Inject 1 mL (156 mg total) into the muscle every 30 (thirty) days. 1 mL 11   paliperidone (INVEGA) 6 MG 24 hr tablet Take 1 tablet (6 mg total) by mouth every morning. 30 tablet 3   benztropine (COGENTIN) 1 MG tablet Take 1 tablet (1 mg total) by mouth 2 (two) times daily. 60 tablet 3   bictegravir-emtricitabine-tenofovir AF (BIKTARVY) 50-200-25 MG TABS tablet Take 1 tablet by mouth daily.     levETIRAcetam (KEPPRA) 500 MG tablet Take 500 mg by mouth 2 (two) times daily.     Current Facility-Administered Medications  Medication Dose Route Frequency Provider Last Rate Last Admin  paliperidone (INVEGA SUSTENNA) injection 156 mg  156 mg Intramuscular Once          Musculoskeletal: Strength & Muscle Tone: within normal limits Gait & Station: normal Patient leans: N/A  Psychiatric Specialty Exam: Review of Systems  Blood pressure (!) 140/93, pulse 71, temperature 97.8 F (36.6 C), height 5\' 9"  (1.753 m), weight 196 lb 3.2 oz (89 kg), SpO2 100%.Body mass index is 28.97 kg/m.  General Appearance: Well Groomed  Eye Contact:  Good  Speech:  Slurred  Volume:  Normal  Mood:  Anxious  Affect:  Appropriate and Congruent  Thought Process:  Coherent, Goal Directed, and Linear  Orientation:  Full (Time, Place, and Person)  Thought Content: WDL and Logical   Suicidal Thoughts:  No  Homicidal Thoughts:  No  Memory:  Immediate;   Good Recent;   Good Remote;   Good  Judgement:  Good  Insight:  Good  Psychomotor Activity:  Normal  Concentration:  Concentration: Good and Attention Span: Good  Recall:  Good  Fund of Knowledge: Good  Language: Good  Akathisia:  No  Handed:  Right  AIMS (if indicated): not done  Assets:  Communication Skills Desire for Improvement Financial Resources/Insurance Housing Intimacy Leisure Time Social Support  ADL's:   Intact  Cognition: WNL  Sleep:  Good   Screenings: AIMS    Flowsheet Row Office Visit from 08/22/2022 in [facility]  AIMS Total Score 1      GAD-7    Flowsheet Row Office Visit from 12/07/2022 in [facility] Office Visit from 11/09/2022 in [facility] Office Visit from 08/22/2022 in [facility]  Total GAD-7 Score 20 0 11      PHQ2-9    Flowsheet Row Office Visit from 12/07/2022 in [facility] Office Visit from 11/09/2022 in [facility] Office Visit from 08/22/2022 in [facility] Office Visit from 10/10/2021 in [facility] for Infectious Disease Office Visit from 05/17/2021 in [place] for Infectious Disease  PHQ-2 Total Score 1 0 2 0 0  PHQ-9 Total Score 11 0 9 -- --      Flowsheet Row ED from 11/06/2022 in [facility] Department [facility] ED from 10/29/2022 in [facility] Emergency Department at [facility] ED from 10/25/2022 in [place] Emergency Department at [facility]  C-SSRS RISK CATEGORY No Risk No Risk Moderate Risk        Assessment and Plan: Patient notes that since being discharged form old [place] on 11/23/2022 he has been without his medications and experiencing VAH, anxiety, and symptoms of mania. He also endorses cocaine and marijuana use. Today provider informed patient that Klonopin would not be reordered. Provider reordered cogentin 1 mg twice daily. Per record review the patient was referred to [facility] for his substance use.  Patient notes that ever that he does not feel that he go to [facility].  He will continue other medications as prescribed.  1. Schizoaffective disorder, bipolar type (HCC)  Continue- paliperidone (INVEGA SUSTENNA) injection 156 mg Continue- paliperidone (INVEGA SUSTENNA) 156 MG/ML SUSY injection; Inject 1 mL (156  mg total) into the muscle every 30 (thirty) days.  Dispense: 1 mL; Refill: 11 Continue- divalproex (DEPAKOTE) 500 MG DR tablet; Take 1 tablet (500 mg total) by mouth at bedtime.  Dispense: 30 tablet; Refill: 3 Continue- paliperidone (INVEGA) 6 MG 24 hr  tablet; Take 1 tablet (6 mg total) by mouth every morning.  Dispense: 30 tablet; Refill: 3 Restart- benztropine (COGENTIN) 1 MG tablet; Take 1 tablet (1 mg total) by mouth 2 (two) times daily.  Dispense: 60 tablet; Refill: 3  2. Cocaine abuse (HCC)   3. Marijuana use   Collaboration of Care: Collaboration of Care: Other provider involved in patient's care AEB PCP and shot clinic staff  Patient/Guardian was advised Release of Information must be obtained prior to any record release in order to collaborate their care with an outside provider. Patient/Guardian was advised if they have not already done so to contact the registration department to sign all necessary forms in order for [place] to release information regarding their care.   Consent: Patient/Guardian gives verbal consent for treatment and assignment of benefits for services provided during this visit. Patient/Guardian expressed understanding and agreed to proceed.   Follow up in 3 months Follow up in 3  weeks for shot clinic Shanna Cisco, NP 12/07/2022, 1:19 PM

## 2022-12-07 NOTE — Telephone Encounter (Signed)
PT came up to the front desk and started asking for dates and to " hang out " I kept telling the PT no thank you and no thank you but he would not stop. Documenting this for my safety

## 2022-12-17 ENCOUNTER — Encounter (HOSPITAL_COMMUNITY): Payer: Self-pay | Admitting: Emergency Medicine

## 2022-12-17 ENCOUNTER — Other Ambulatory Visit: Payer: Self-pay

## 2022-12-17 ENCOUNTER — Emergency Department (HOSPITAL_COMMUNITY): Payer: PRIVATE HEALTH INSURANCE

## 2022-12-17 ENCOUNTER — Emergency Department (HOSPITAL_COMMUNITY)
Admission: EM | Admit: 2022-12-17 | Discharge: 2022-12-18 | Payer: No Typology Code available for payment source | Attending: Student | Admitting: Student

## 2022-12-17 DIAGNOSIS — I10 Essential (primary) hypertension: Secondary | ICD-10-CM | POA: Insufficient documentation

## 2022-12-17 DIAGNOSIS — S0083XA Contusion of other part of head, initial encounter: Secondary | ICD-10-CM | POA: Diagnosis not present

## 2022-12-17 DIAGNOSIS — T148XXA Other injury of unspecified body region, initial encounter: Secondary | ICD-10-CM

## 2022-12-17 DIAGNOSIS — B20 Human immunodeficiency virus [HIV] disease: Secondary | ICD-10-CM | POA: Diagnosis not present

## 2022-12-17 DIAGNOSIS — M25572 Pain in left ankle and joints of left foot: Secondary | ICD-10-CM | POA: Diagnosis not present

## 2022-12-17 DIAGNOSIS — S0990XA Unspecified injury of head, initial encounter: Secondary | ICD-10-CM

## 2022-12-17 DIAGNOSIS — W228XXA Striking against or struck by other objects, initial encounter: Secondary | ICD-10-CM | POA: Diagnosis not present

## 2022-12-17 DIAGNOSIS — F17210 Nicotine dependence, cigarettes, uncomplicated: Secondary | ICD-10-CM | POA: Diagnosis not present

## 2022-12-17 DIAGNOSIS — R569 Unspecified convulsions: Secondary | ICD-10-CM | POA: Insufficient documentation

## 2022-12-17 DIAGNOSIS — J45909 Unspecified asthma, uncomplicated: Secondary | ICD-10-CM | POA: Diagnosis not present

## 2022-12-17 MED ORDER — BICTEGRAVIR-EMTRICITAB-TENOFOV 50-200-25 MG PO TABS: 1.0000 | ORAL_TABLET | Freq: Every day | ORAL | Status: DC

## 2022-12-17 MED ORDER — DIVALPROEX SODIUM 250 MG PO DR TAB
500.0000 mg | DELAYED_RELEASE_TABLET | Freq: Once | ORAL | Status: AC
  Administered 2022-12-18: 500 mg via ORAL
  Filled 2022-12-17: qty 2

## 2022-12-17 MED ORDER — PALIPERIDONE ER 6 MG PO TB24
6.0000 mg | ORAL_TABLET | Freq: Every morning | ORAL | Status: DC
  Administered 2022-12-18: 6 mg via ORAL
  Filled 2022-12-17: qty 1

## 2022-12-17 MED ORDER — BENZTROPINE MESYLATE 1 MG PO TABS
1.0000 mg | ORAL_TABLET | Freq: Two times a day (BID) | ORAL | Status: DC
  Administered 2022-12-18: 1 mg via ORAL
  Filled 2022-12-17: qty 1
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
## 2022-12-17 NOTE — ED Notes (Signed)
Xray tech at bedside at this time

## 2022-12-17 NOTE — ED Notes (Signed)
ED Provider at bedside. 

## 2022-12-18 ENCOUNTER — Telehealth (HOSPITAL_COMMUNITY): Payer: Self-pay | Admitting: Psychiatry

## 2022-12-18 ENCOUNTER — Emergency Department (HOSPITAL_COMMUNITY): Payer: No Typology Code available for payment source

## 2022-12-18 DIAGNOSIS — S0083XA Contusion of other part of head, initial encounter: Secondary | ICD-10-CM | POA: Diagnosis not present

## 2022-12-18 LAB — CBC WITH DIFFERENTIAL/PLATELET
Abs Immature Granulocytes: 0.01 10*3/uL (ref 0.00–0.07)
Basophils Absolute: 0 10*3/uL (ref 0.0–0.1)
Basophils Relative: 1 %
Eosinophils Absolute: 0.1 10*3/uL (ref 0.0–0.5)
Eosinophils Relative: 1 %
HCT: 41.9 % (ref 39.0–52.0)
Hemoglobin: 13.3 g/dL (ref 13.0–17.0)
Immature Granulocytes: 0 %
Lymphocytes Relative: 24 %
Lymphs Abs: 1.5 10*3/uL (ref 0.7–4.0)
MCH: 30.6 pg (ref 26.0–34.0)
MCHC: 31.7 g/dL (ref 30.0–36.0)
MCV: 96.3 fL (ref 80.0–100.0)
Monocytes Absolute: 0.5 10*3/uL (ref 0.1–1.0)
Monocytes Relative: 9 %
Neutro Abs: 4 10*3/uL (ref 1.7–7.7)
Neutrophils Relative %: 65 %
Platelets: 180 10*3/uL (ref 150–400)
RBC: 4.35 MIL/uL (ref 4.22–5.81)
RDW: 11.5 % (ref 11.5–15.5)
WBC: 6.1 10*3/uL (ref 4.0–10.5)
nRBC: 0 % (ref 0.0–0.2)

## 2022-12-18 LAB — COMPREHENSIVE METABOLIC PANEL
ALT: 25 U/L (ref 0–44)
AST: 28 U/L (ref 15–41)
Albumin: 3.6 g/dL (ref 3.5–5.0)
Alkaline Phosphatase: 62 U/L (ref 38–126)
Anion gap: 9 (ref 5–15)
BUN: 9 mg/dL (ref 6–20)
CO2: 22 mmol/L (ref 22–32)
Calcium: 9.5 mg/dL (ref 8.9–10.3)
Chloride: 109 mmol/L (ref 98–111)
Creatinine, Ser: 1 mg/dL (ref 0.61–1.24)
GFR, Estimated: >60 mL/min (ref >60)
Glucose, Bld: 84 mg/dL (ref 70–99)
Potassium: 3.5 mmol/L (ref 3.5–5.1)
Sodium: 140 mmol/L (ref 135–145)
Total Bilirubin: 0.9 mg/dL (ref 0.3–1.2)
Total Protein: 6.5 g/dL (ref 6.5–8.1)

## 2022-12-18 LAB — ETHANOL: Alcohol, Ethyl (B): <10 mg/dL (ref <10)

## 2022-12-18 MED ORDER — IOHEXOL 350 MG/ML SOLN
75.0000 mL | Freq: Once | INTRAVENOUS | Status: AC | PRN
  Administered 2022-12-18: 75 mL via INTRAVENOUS

## 2022-12-18 NOTE — ED Notes (Signed)
Patient transported to CT 

## 2022-12-18 NOTE — ED Notes (Signed)
Pt returned back to room from CT

## 2022-12-18 NOTE — Telephone Encounter (Signed)
Thank you for this update. I will await her visit.

## 2022-12-18 NOTE — ED Notes (Signed)
Cleared from c-collar by provider.

## 2022-12-20 ENCOUNTER — Other Ambulatory Visit: Payer: Self-pay

## 2022-12-20 MED ORDER — BIKTARVY 50-200-25 MG PO TABS: 1.0000 | ORAL_TABLET | Freq: Every day | ORAL | 0 refills | Status: DC

## 2022-12-26 ENCOUNTER — Ambulatory Visit (HOSPITAL_COMMUNITY): Payer: MEDICAID

## 2023-01-02 ENCOUNTER — Encounter (HOSPITAL_COMMUNITY): Payer: Self-pay

## 2023-01-02 ENCOUNTER — Ambulatory Visit (INDEPENDENT_AMBULATORY_CARE_PROVIDER_SITE_OTHER): Payer: MEDICAID

## 2023-01-02 ENCOUNTER — Other Ambulatory Visit (HOSPITAL_COMMUNITY): Payer: Self-pay | Admitting: Psychiatry

## 2023-01-02 VITALS — BP 142/92 | HR 83 | Ht 69.0 in | Wt 194.2 lb

## 2023-01-02 DIAGNOSIS — F411 Generalized anxiety disorder: Secondary | ICD-10-CM

## 2023-01-02 DIAGNOSIS — G4700 Insomnia, unspecified: Secondary | ICD-10-CM | POA: Diagnosis not present

## 2023-01-02 DIAGNOSIS — F200 Paranoid schizophrenia: Secondary | ICD-10-CM | POA: Diagnosis not present

## 2023-01-02 DIAGNOSIS — F250 Schizoaffective disorder, bipolar type: Secondary | ICD-10-CM

## 2023-01-02 MED ORDER — PALIPERIDONE PALMITATE ER 234 MG/1.5ML IM SUSY
234.0000 mg | PREFILLED_SYRINGE | Freq: Once | INTRAMUSCULAR | Status: AC
  Administered 2023-01-02: 234 mg via INTRAMUSCULAR

## 2023-01-02 MED ORDER — INVEGA SUSTENNA 156 MG/ML IM SUSY: 156.0000 mg | PREFILLED_SYRINGE | INTRAMUSCULAR | 11 refills | Status: DC

## 2023-01-02 MED ORDER — DIVALPROEX SODIUM 500 MG PO DR TAB: 500.0000 mg | DELAYED_RELEASE_TABLET | Freq: Every evening | ORAL | 3 refills | Status: DC

## 2023-01-02 MED ORDER — BENZTROPINE MESYLATE 1 MG PO TABS: 1.0000 mg | ORAL_TABLET | Freq: Two times a day (BID) | ORAL | 3 refills | Status: DC

## 2023-01-02 MED ORDER — PALIPERIDONE ER 6 MG PO TB24: 6.0000 mg | ORAL_TABLET | Freq: Every morning | ORAL | 3 refills | Status: DC

## 2023-01-02 NOTE — Progress Notes (Cosign Needed)
PATIENT PRESENTED TO THE OFFICE FOR INVEGA SUSTENNA 234 INJECTION . PATIENT TOLERATED THE INJECTION WELL IN THE LEFT DELTOID GIVEN BY Moses Odoherty ,HE WILL RETURN IN 28 DAYS FOR NEXT INJECTION .
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
## 2023-01-04 ENCOUNTER — Telehealth (HOSPITAL_COMMUNITY): Payer: Self-pay | Admitting: *Deleted

## 2023-01-04 ENCOUNTER — Ambulatory Visit (INDEPENDENT_AMBULATORY_CARE_PROVIDER_SITE_OTHER): Payer: No Typology Code available for payment source | Admitting: Physician Assistant

## 2023-01-04 ENCOUNTER — Telehealth: Payer: Self-pay

## 2023-01-04 ENCOUNTER — Encounter: Payer: Self-pay | Admitting: Physician Assistant

## 2023-01-04 ENCOUNTER — Other Ambulatory Visit (HOSPITAL_COMMUNITY): Payer: Self-pay

## 2023-01-04 ENCOUNTER — Other Ambulatory Visit: Payer: Self-pay

## 2023-01-04 ENCOUNTER — Ambulatory Visit (HOSPITAL_COMMUNITY): Payer: MEDICAID

## 2023-01-04 VITALS — BP 149/105 | HR 82 | Temp 98.1°F | Ht 72.0 in | Wt 200.0 lb

## 2023-01-04 DIAGNOSIS — Z21 Asymptomatic human immunodeficiency virus [HIV] infection status: Secondary | ICD-10-CM

## 2023-01-04 DIAGNOSIS — F1410 Cocaine abuse, uncomplicated: Secondary | ICD-10-CM | POA: Diagnosis not present

## 2023-01-04 DIAGNOSIS — F250 Schizoaffective disorder, bipolar type: Secondary | ICD-10-CM

## 2023-01-04 DIAGNOSIS — B20 Human immunodeficiency virus [HIV] disease: Secondary | ICD-10-CM

## 2023-01-04 MED ORDER — BIKTARVY 50-200-25 MG PO TABS: 1.0000 | ORAL_TABLET | Freq: Every day | ORAL | 6 refills | Status: DC

## 2023-01-04 NOTE — Telephone Encounter (Signed)
Fax received for approval of Gean [place]ch[place] 156mg  until 01/04/24. Notified pharmacy.

## 2023-01-04 NOTE — Telephone Encounter (Signed)
Provider called [facility] however did not get contact with nursing staff or administrative staff.  Provider was told to leave a message.  Provider informed patient that she did not have access to patient's seizure records.  Provided left the number ti jail where patient was last treated medically.  Provider instructed [facility] staff to call back with further questions or concerns.
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
## 2023-01-04 NOTE — Telephone Encounter (Signed)
Fax received for prior authorization of Invega Sustenna 156mg . Submitted online. Awaiting response.

## 2023-01-04 NOTE — Telephone Encounter (Signed)
Faxed labs from 09/2021 and letter written by Arvilla Meres, PA to [facility] per patient request. ROI completed.   Copy of records in triage.   [address] [place], [place] [address]  Phone: [phone] Fax: [phone]  Sandie Ano, RN

## 2023-01-04 NOTE — Patient Instructions (Addendum)
Refiled biktarvy take once daily, 4 weeks of samples provided Letter to [facility]  Continue all medications as directed Follow up in 1 month

## 2023-01-11 ENCOUNTER — Other Ambulatory Visit: Payer: Self-pay | Admitting: Pharmacist

## 2023-01-11 DIAGNOSIS — B20 Human immunodeficiency virus [HIV] disease: Secondary | ICD-10-CM

## 2023-01-11 MED ORDER — BICTEGRAVIR-EMTRICITAB-TENOFOV 50-200-25 MG PO TABS: 1.0000 | ORAL_TABLET | Freq: Every day | ORAL | Status: AC

## 2023-01-11 NOTE — Progress Notes (Signed)
 Medication Samples have been provided to the patient.  Drug name: Biktarvy        Strength: 50/200/25 mg       Qty: 28 tablets (4 bottles) LOT: CSCFVA   Exp.Date: 10/26  Dosing instructions: Take one tablet by mouth once daily  The patient has been instructed regarding the correct time, dose, and frequency of taking this medication, including desired effects and most common side effects.   Margarite Gouge, PharmD, CPP, BCIDP, AAHIVP Clinical Pharmacist Practitioner Infectious Diseases Clinical Pharmacist [facility]

## 2023-01-22 ENCOUNTER — Other Ambulatory Visit: Payer: Self-pay | Admitting: Infectious Disease

## 2023-01-22 DIAGNOSIS — B20 Human immunodeficiency virus [HIV] disease: Secondary | ICD-10-CM

## 2023-01-23 LAB — HIV-1 GENOTYPING (RTI,PI,IN INHBTR)
Date Viral Load Collected: 11142024
HIV-1 Genotype: NOT DETECTED

## 2023-01-23 LAB — LIPID PANEL
Cholesterol: 151 mg/dL (ref <200)
HDL: 43 mg/dL (ref >=40)
LDL Cholesterol (Calc): 91 mg/dL
Non-HDL Cholesterol (Calc): 108 mg/dL (ref <130)
Total CHOL/HDL Ratio: 3.5 (calc) (ref <5.0)
Triglycerides: 80 mg/dL (ref <150)

## 2023-01-23 LAB — C. TRACHOMATIS/N. GONORRHOEAE RNA
C. trachomatis RNA, TMA: NOT DETECTED
N. gonorrhoeae RNA, TMA: NOT DETECTED

## 2023-01-23 LAB — HEPATITIS C ANTIBODY: Hepatitis C Ab: NONREACTIVE

## 2023-01-23 LAB — T-HELPER CELLS (CD4) COUNT (NOT AT ARMC)
Absolute CD4: 401 {cells}/uL — ABNORMAL LOW (ref 490–1740)
CD4 T Helper %: 35 % (ref 30–61)
Total lymphocyte count: 1140 {cells}/uL (ref 850–3900)

## 2023-01-25 ENCOUNTER — Ambulatory Visit: Payer: PRIVATE HEALTH INSURANCE | Admitting: Infectious Disease

## 2023-01-30 ENCOUNTER — Ambulatory Visit: Payer: No Typology Code available for payment source | Admitting: Infectious Disease

## 2023-01-30 DIAGNOSIS — F1910 Other psychoactive substance abuse, uncomplicated: Secondary | ICD-10-CM

## 2023-01-30 DIAGNOSIS — Z7185 Encounter for immunization safety counseling: Secondary | ICD-10-CM

## 2023-01-30 DIAGNOSIS — A549 Gonococcal infection, unspecified: Secondary | ICD-10-CM

## 2023-01-30 DIAGNOSIS — B20 Human immunodeficiency virus [HIV] disease: Secondary | ICD-10-CM

## 2023-01-30 DIAGNOSIS — F1410 Cocaine abuse, uncomplicated: Secondary | ICD-10-CM

## 2023-01-30 DIAGNOSIS — Z79899 Other long term (current) drug therapy: Secondary | ICD-10-CM

## 2023-01-30 DIAGNOSIS — F4321 Adjustment disorder with depressed mood: Secondary | ICD-10-CM

## 2023-01-30 DIAGNOSIS — Z72811 Adult antisocial behavior: Secondary | ICD-10-CM

## 2023-02-01 ENCOUNTER — Ambulatory Visit: Payer: PRIVATE HEALTH INSURANCE | Admitting: Infectious Disease

## 2023-02-22 ENCOUNTER — Ambulatory Visit (HOSPITAL_COMMUNITY): Payer: MEDICAID

## 2023-03-06 ENCOUNTER — Ambulatory Visit (HOSPITAL_COMMUNITY): Payer: MEDICAID

## 2023-03-29 ENCOUNTER — Encounter (HOSPITAL_COMMUNITY): Payer: Self-pay | Admitting: Psychiatry

## 2023-03-29 ENCOUNTER — Ambulatory Visit (HOSPITAL_COMMUNITY): Payer: MEDICAID | Admitting: Psychiatry

## 2023-03-29 DIAGNOSIS — F1220 Cannabis dependence, uncomplicated: Secondary | ICD-10-CM

## 2023-03-29 DIAGNOSIS — F250 Schizoaffective disorder, bipolar type: Secondary | ICD-10-CM | POA: Diagnosis not present

## 2023-03-29 DIAGNOSIS — F1490 Cocaine use, unspecified, uncomplicated: Secondary | ICD-10-CM | POA: Diagnosis not present

## 2023-03-29 DIAGNOSIS — F1510 Other stimulant abuse, uncomplicated: Secondary | ICD-10-CM | POA: Diagnosis not present

## 2023-03-29 MED ORDER — PALIPERIDONE ER 9 MG PO TB24: 9.0000 mg | ORAL_TABLET | Freq: Every morning | ORAL | 3 refills | Status: DC

## 2023-03-29 MED ORDER — PALIPERIDONE PALMITATE ER 234 MG/1.5ML IM SUSY: 234.0000 mg | PREFILLED_SYRINGE | Freq: Once | INTRAMUSCULAR | Status: DC

## 2023-03-29 MED ORDER — BENZTROPINE MESYLATE 1 MG PO TABS: 1.0000 mg | ORAL_TABLET | Freq: Two times a day (BID) | ORAL | 3 refills | Status: DC

## 2023-03-29 MED ORDER — DIVALPROEX SODIUM 500 MG PO DR TAB: 500.0000 mg | DELAYED_RELEASE_TABLET | Freq: Every evening | ORAL | 3 refills | Status: DC
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
## 2023-04-09 ENCOUNTER — Telehealth: Payer: Self-pay

## 2023-04-09 NOTE — Telephone Encounter (Signed)
Received call from Dr. Mariane Masters with [facility]. Jesus is admitted for psychiatric concerns and is declining any lab draws. Dr. Uvaldo Rising wanted to confirm patient's ART regimen is once daily Biktarvy.   P: [phone]  Sandie Ano, RN

## 2023-04-17 ENCOUNTER — Telehealth: Payer: Self-pay

## 2023-04-17 NOTE — Telephone Encounter (Signed)
 Thank you for this update

## 2023-04-17 NOTE — Telephone Encounter (Signed)
 Medication management - Prior authorization for patient's Paliperidone ER 9 mg tablets submitted online through CoverMyMeds and sent to patient's [employer] for review and pending decision

## 2023-04-18 ENCOUNTER — Telehealth (HOSPITAL_COMMUNITY): Payer: Self-pay | Admitting: *Deleted

## 2023-04-18 NOTE — Telephone Encounter (Signed)
 Fax received for approval of Paliperidone ER 9mg  until 04/16/24. Called to notify pharmacy.

## 2023-04-20 ENCOUNTER — Other Ambulatory Visit (HOSPITAL_COMMUNITY): Payer: Self-pay | Admitting: Psychiatry

## 2023-04-20 DIAGNOSIS — F250 Schizoaffective disorder, bipolar type: Secondary | ICD-10-CM

## 2023-05-13 ENCOUNTER — Encounter: Payer: Self-pay | Admitting: Infectious Disease

## 2023-05-13 DIAGNOSIS — E785 Hyperlipidemia, unspecified: Secondary | ICD-10-CM | POA: Insufficient documentation

## 2023-05-13 HISTORY — DX: Hyperlipidemia, unspecified: E78.5

## 2023-05-14 ENCOUNTER — Encounter: Payer: Self-pay | Admitting: Infectious Disease

## 2023-05-14 ENCOUNTER — Ambulatory Visit (INDEPENDENT_AMBULATORY_CARE_PROVIDER_SITE_OTHER): Payer: No Typology Code available for payment source | Admitting: Infectious Disease

## 2023-05-14 ENCOUNTER — Other Ambulatory Visit (HOSPITAL_COMMUNITY)
Admission: RE | Admit: 2023-05-14 | Discharge: 2023-05-14 | Disposition: A | Discharge status: Home / Self Care | Source: Ambulatory Visit | Attending: Infectious Disease | Admitting: Infectious Disease

## 2023-05-14 ENCOUNTER — Other Ambulatory Visit: Payer: Self-pay

## 2023-05-14 VITALS — BP 129/82 | HR 94 | Wt 201.0 lb

## 2023-05-14 DIAGNOSIS — I10 Essential (primary) hypertension: Secondary | ICD-10-CM

## 2023-05-14 DIAGNOSIS — Z7185 Encounter for immunization safety counseling: Secondary | ICD-10-CM

## 2023-05-14 DIAGNOSIS — B20 Human immunodeficiency virus [HIV] disease: Secondary | ICD-10-CM | POA: Diagnosis not present

## 2023-05-14 DIAGNOSIS — F319 Bipolar disorder, unspecified: Secondary | ICD-10-CM

## 2023-05-14 DIAGNOSIS — F209 Schizophrenia, unspecified: Secondary | ICD-10-CM

## 2023-05-14 DIAGNOSIS — E785 Hyperlipidemia, unspecified: Secondary | ICD-10-CM

## 2023-05-14 DIAGNOSIS — F250 Schizoaffective disorder, bipolar type: Secondary | ICD-10-CM

## 2023-05-14 MED ORDER — BIKTARVY 50-200-25 MG PO TABS: 1.0000 | ORAL_TABLET | Freq: Every day | ORAL | 11 refills | Status: DC

## 2023-05-14 NOTE — Progress Notes (Signed)
 Subjective:  Chief complaint: follow-up for HIV disease on medications   Patient ID: [name] [name], male    DOB: [birth year], 48 y.o.   MRN: [id number]  HPI  Discussed the use of AI scribe software for clinical note transcription with the patient, who gave verbal consent to proceed.  History of Present Illness   The patient, on multiple medications including Biktarvy for HIV, Depakote, Haldol, Lithium, Invega (both pill and shot), and Amlodipine for blood pressure, presents with a history of inconsistent medication adherence, particularly with Biktarvy. The patient reports receiving medications for free through Medicaid and is considering switching pharmacies for convenience. The patient's blood pressure is well-controlled. The patient's last viral load test showed undetectable levels of the virus. The patient is also attending NA classes and reports not using drugs.       Past Medical History:  Diagnosis Date   Aggression    Alcohol abuse    Asthma    Gonorrhea 02/09/2020   Grief 06/14/2020   HIV disease (HCC) 03/11/2019   Homeless    Hyperlipidemia 05/13/2023   Hypertension    Penile discharge 05/19/2019   Schizophrenia (HCC)    Vaccine counseling 10/24/2022    Past Surgical History:  Procedure Laterality Date   KNEE SURGERY     MOUTH SURGERY      No family history on file.    Social History   Socioeconomic History   Marital status: Married    Spouse name: Not on file   Number of children: Not on file   Years of education: Not on file   Highest education level: Not on file  Occupational History   Not on file  Tobacco Use   Smoking status: Every Day    Types: Cigarettes   Smokeless tobacco: Never  Substance and Sexual Activity   Alcohol use: Not Currently   Drug use: Not Currently    Types: Cocaine, Marijuana, Hydrocodone, MDMA (Ecstacy)    Comment: former; patient states he has been clean since 01/25/20   Sexual activity: Yes    Partners: Female     Birth control/protection: Condom    Comment: "just give me 2 condoms"  Other Topics Concern   Not on file  Social History Narrative   Not on file   Social Drivers of Health   Financial Resource Strain: Not on file  Food Insecurity: Not on file  Transportation Needs: Not on file  Physical Activity: Not on file  Stress: Not on file  Social Connections: Not on file    Allergies  Allergen Reactions   Prunus Persica Swelling and Other (See Comments)    "Peaches"   Sudafed [Pseudoephedrine] Anaphylaxis, Swelling and Rash   Other Itching and Other (See Comments)    Pollen- "closes my throat" and causes itchy eyes & a runny nose     Current Outpatient Medications:    benztropine (COGENTIN) 1 MG tablet, Take 1 tablet (1 mg total) by mouth 2 (two) times daily., Disp: 60 tablet, Rfl: 3   bictegravir-emtricitabine-tenofovir AF (BIKTARVY) 50-200-25 MG TABS tablet, Take 1 tablet by mouth daily., Disp: 30 tablet, Rfl: 6   divalproex (DEPAKOTE) 500 MG DR tablet, Take 1 tablet (500 mg total) by mouth at bedtime., Disp: 30 tablet, Rfl: 3   levETIRAcetam (KEPPRA) 500 MG tablet, Take 500 mg by mouth 2 (two) times daily., Disp: , Rfl:    paliperidone (INVEGA) 9 MG 24 hr tablet, Take 1 tablet (9 mg total) by mouth every morning., Disp: 30  tablet, Rfl: 3  Current Facility-Administered Medications:    paliperidone (INVEGA SUSTENNA) injection 234 mg, 234 mg, Intramuscular, Once,     Review of Systems  Constitutional:  Negative for activity change, appetite change, chills, diaphoresis, fatigue, fever and unexpected weight change.  HENT:  Negative for congestion, rhinorrhea, sinus pressure, sneezing, sore throat and trouble swallowing.   Eyes:  Negative for photophobia and visual disturbance.  Respiratory:  Negative for cough, chest tightness, shortness of breath, wheezing and stridor.   Cardiovascular:  Negative for chest pain, palpitations and leg swelling.  Gastrointestinal:  Negative for  abdominal distention, abdominal pain, anal bleeding, blood in stool, constipation, diarrhea, nausea and vomiting.  Genitourinary:  Negative for difficulty urinating, dysuria, flank pain and hematuria.  Musculoskeletal:  Negative for arthralgias, back pain, gait problem, joint swelling and myalgias.  Skin:  Negative for color change, pallor, rash and wound.  Neurological:  Negative for dizziness, tremors, weakness and light-headedness.  Hematological:  Negative for adenopathy. Does not bruise/bleed easily.  Psychiatric/Behavioral:  Negative for agitation, behavioral problems, confusion, decreased concentration, dysphoric mood and sleep disturbance.        Objective:   Physical Exam Constitutional:      Appearance: He is well-developed.  HENT:     Head: Normocephalic and atraumatic.  Eyes:     Conjunctiva/sclera: Conjunctivae normal.  Cardiovascular:     Rate and Rhythm: Normal rate and regular rhythm.  Pulmonary:     Effort: Pulmonary effort is normal. No respiratory distress.     Breath sounds: No wheezing.  Abdominal:     General: There is no distension.     Palpations: Abdomen is soft.  Musculoskeletal:        General: No tenderness. Normal range of motion.     Cervical back: Normal range of motion and neck supple.  Skin:    General: Skin is warm and dry.     Coloration: Skin is not pale.     Findings: No erythema or rash.  Neurological:     General: No focal deficit present.     Mental Status: He is alert and oriented to person, place, and time.  Psychiatric:        Attention and Perception: Attention normal.        Mood and Affect: Mood normal.        Behavior: Behavior normal.        Thought Content: Thought content normal.        Judgment: Judgment normal.           Assessment & Plan:   Assessment and Plan    HIV infection Concerns about adherence due to refill history and misconceptions about medication discontinuation. Educated on daily adherence  necessity to prevent viral rebound, transmission, and progression to AIDS. Discussed risks of inconsistent use and SMART study findings on negative outcomes of intermittent therapy. - Emphasize daily adherence to [employer]. --check HIV RNA, CD4 today - Educate on risks of inconsistent medication use. - Schedule follow-up in two months.  Medication adherence issues Inconsistent adherence to Biktarvy. Educated on adherence importance to prevent viral rebound and health complications. Discussed strategies to improve adherence. - Reinforce medication adherence importance. - Discuss strategies to improve adherence, such as reminders or support systems.  Hypertension Blood pressure well-controlled on amlodipine. - Continue amlodipine therapy.  Bipolar disorder Stable on current regimen of Depakote, Haldol, lithium, and Invega. No acute mood instability. - Continue current psychiatric medications: Depakote, Haldol, lithium, and Invega.  Polysubstance abuse: currently  clean

## 2023-05-15 LAB — T-HELPER CELLS (CD4) COUNT (NOT AT ARMC)
CD4 % Helper T Cell: 39 % (ref 33–65)
CD4 T Cell Abs: 514 /uL (ref 400–1790)

## 2023-05-16 LAB — URINE CYTOLOGY ANCILLARY ONLY
Chlamydia: NEGATIVE
Comment: NEGATIVE
Comment: NORMAL
Neisseria Gonorrhea: NEGATIVE

## 2023-05-17 LAB — CBC WITH DIFFERENTIAL/PLATELET
Absolute Lymphocytes: 1469 {cells}/uL (ref 850–3900)
Absolute Monocytes: 564 {cells}/uL (ref 200–950)
Basophils Absolute: 50 {cells}/uL (ref 0–200)
Basophils Relative: 0.8 %
Eosinophils Absolute: 422 {cells}/uL (ref 15–500)
Eosinophils Relative: 6.8 %
HCT: 43.2 % (ref 38.5–50.0)
Hemoglobin: 14.1 g/dL (ref 13.2–17.1)
MCH: 31.6 pg (ref 27.0–33.0)
MCHC: 32.6 g/dL (ref 32.0–36.0)
MCV: 96.9 fL (ref 80.0–100.0)
MPV: 12 fL (ref 7.5–12.5)
Monocytes Relative: 9.1 %
Neutro Abs: 3695 {cells}/uL (ref 1500–7800)
Neutrophils Relative %: 59.6 %
Platelets: 158 10*3/uL (ref 140–400)
RBC: 4.46 10*6/uL (ref 4.20–5.80)
RDW: 11.7 % (ref 11.0–15.0)
Total Lymphocyte: 23.7 %
WBC: 6.2 10*3/uL (ref 3.8–10.8)

## 2023-05-17 LAB — COMPLETE METABOLIC PANEL WITHOUT GFR
AG Ratio: 1.7 (calc) (ref 1.0–2.5)
ALT: 15 U/L (ref 9–46)
AST: 15 U/L (ref 10–40)
Albumin: 4 g/dL (ref 3.6–5.1)
Alkaline phosphatase (APISO): 85 U/L (ref 36–130)
BUN: 15 mg/dL (ref 7–25)
CO2: 25 mmol/L (ref 20–32)
Calcium: 9.3 mg/dL (ref 8.6–10.3)
Chloride: 109 mmol/L (ref 98–110)
Creat: 1.11 mg/dL (ref 0.60–1.29)
Globulin: 2.4 g/dL (ref 1.9–3.7)
Glucose, Bld: 88 mg/dL (ref 65–99)
Potassium: 4.1 mmol/L (ref 3.5–5.3)
Sodium: 139 mmol/L (ref 135–146)
Total Bilirubin: 0.4 mg/dL (ref 0.2–1.2)
Total Protein: 6.4 g/dL (ref 6.1–8.1)

## 2023-05-17 LAB — LIPID PANEL
Cholesterol: 187 mg/dL (ref <200)
HDL: 42 mg/dL (ref >=40)
LDL Cholesterol (Calc): 114 mg/dL — ABNORMAL HIGH
Non-HDL Cholesterol (Calc): 145 mg/dL — ABNORMAL HIGH (ref <130)
Total CHOL/HDL Ratio: 4.5 (calc) (ref <5.0)
Triglycerides: 190 mg/dL — ABNORMAL HIGH (ref <150)

## 2023-05-17 LAB — HIV RNA, RTPCR W/R GT (RTI, PI,INT)
HIV 1 RNA Quant: NOT DETECTED {copies}/mL
HIV-1 RNA Quant, Log: NOT DETECTED {Log_copies}/mL

## 2023-05-17 LAB — RPR: RPR Ser Ql: NONREACTIVE

## 2023-05-22 ENCOUNTER — Encounter (HOSPITAL_COMMUNITY): Payer: Self-pay | Admitting: Psychiatry

## 2023-05-22 ENCOUNTER — Ambulatory Visit (INDEPENDENT_AMBULATORY_CARE_PROVIDER_SITE_OTHER): Payer: MEDICAID | Admitting: Psychiatry

## 2023-05-22 VITALS — BP 137/102 | HR 77 | Temp 98.0°F | Ht 70.0 in | Wt 196.4 lb

## 2023-05-22 DIAGNOSIS — F1220 Cannabis dependence, uncomplicated: Secondary | ICD-10-CM | POA: Diagnosis not present

## 2023-05-22 DIAGNOSIS — F250 Schizoaffective disorder, bipolar type: Secondary | ICD-10-CM

## 2023-05-22 DIAGNOSIS — F1490 Cocaine use, unspecified, uncomplicated: Secondary | ICD-10-CM | POA: Diagnosis not present

## 2023-05-22 MED ORDER — LITHIUM CARBONATE 300 MG PO TABS
1200.0000 mg | ORAL_TABLET | Freq: Every day | ORAL | 3 refills | Status: DC
Start: 2023-05-22 — End: 2023-08-24

## 2023-05-22 MED ORDER — BENZTROPINE MESYLATE 1 MG PO TABS: 1.0000 mg | ORAL_TABLET | Freq: Two times a day (BID) | ORAL | 3 refills | Status: DC

## 2023-05-22 MED ORDER — HALOPERIDOL DECANOATE 100 MG/ML IM SOLN
100.0000 mg | INTRAMUSCULAR | Status: AC
  Administered 2023-05-22: 100 mg via INTRAMUSCULAR

## 2023-05-22 MED ORDER — DIVALPROEX SODIUM 500 MG PO DR TAB: 500.0000 mg | DELAYED_RELEASE_TABLET | Freq: Every evening | ORAL | 3 refills | Status: DC

## 2023-05-22 MED ORDER — HALOPERIDOL DECANOATE 100 MG/ML IM SOLN: 100.0000 mg | INTRAMUSCULAR | 11 refills | Status: DC

## 2023-05-22 NOTE — Progress Notes (Signed)
 BH MD/PA/NP OP Progress Note  05/22/2023 2:45 PM [name] [name]  MRN:  [id number]  Chief Complaint: "I have been taking Depakote, Lithium, and Haldol"  HPI: 48 year old male seen today for follow up psychiatric evaluation.  He has a psychiatric history of Mild intellectual disability,schizoaffective disorder bipolar type, suicidal ideations, chronic homicidal ideations and Polysubstance abuse (methamphetamine, cannabis use, cocaine use). Patient presented [facility] ED under IVC by [facility] for agitation / threatening behavior in the context of cocaine use and medication non-adherence on 03/29/2023-03/30/2023. Patients was instructed to continue Depakote and  Invega. Patient has not had his Invega LAI since 12/2022  He was prescribed Invega 9 mg at his last visit but notes that he has not been taking it. Patient reports that he is currently taking cogentin 1 mg twice daily, Depakote ER 500 mg at bedtime, Haldol 15 mg nightly (not 15 mg BID), and Lithium 1200 mg daily.  He reports that his medications are effective in managing his psychiatric conditions.   Today he was well-groomed pleasant, cooperative, and engaged in conversation. Patient notes that since his last visit he has been taking his old prescriptions of Haldol, Lithium, and Depakote. Patient notes that his mood has been more stable with this regimen.  Since his hospitalization he notes that he continues to use marijuana and cocaine. He notes that he last used two days ago. Provider informed patient that these substances can negative impact his mental health. He endorsed understanding. Provider informed patient of [facility] detox center in [place] to help manage his cocaine use. At this time he he is not interested.  Patient notes that he has been somewhat happy because he was recently got approved for housing. He also notes that financially he is in a better place. Today provider conducted a GAD 7 and patient scored a 6. Provider also  conducted a PHQ 9 and patient scored a 3. He endorse adequate sleep and appetite. Today he denies SI/HI/VAH, mania, or paranoia.   Patient informed Clinical research associate that he has been urinated in his sleep. He relates it to his elevated blood pressure. Provider asked patient if he has been taking his blood pressure medication as it was elevated today at 137/102. He notes that he was. Provider asked patient if he has a PCP. He notes that he goes to [facility]. Provider instructed patient to follow up with his PCP. He endorsed understanding and agreed.  Provider recommend that Invega be discontinued as patient has been off of it for months. He was agreeable. Patient agreeable to receiving Haldol Decanoate 100 mg every 28 days to manage his mental health. He received his first injection today. Oral haldol discontinued. He will continue all other medications as prescribed. Provider ordered CBC, CMP, LFT, Thyroid Panel, HGB A1c, lithium level, and prolactin level. No other concerns noted at this time.  Visit Diagnosis:    ICD-10-CM   1. Schizoaffective disorder, bipolar type (HCC)  F25.0 benztropine (COGENTIN) 1 MG tablet    lithium 300 MG tablet    divalproex (DEPAKOTE) 500 MG DR tablet    haloperidol decanoate (HALDOL DECANOATE) 100 MG/ML injection 100 mg    Thyroid Panel With TSH    haloperidol decanoate (HALDOL DECANOATE) 100 MG/ML injection    Thyroid Panel With TSH    Lithium level    HgB A1c    Hepatic function panel    Comprehensive Metabolic Panel (CMET)    Prolactin    Prolactin    Comprehensive Metabolic Panel (CMET)  Hepatic function panel    HgB A1c    Lithium level    CANCELED: Lithium level    CANCELED: Comprehensive Metabolic Panel (CMET)    CANCELED: HgB A1c    CANCELED: Hepatic function panel    CANCELED: Prolactin    CANCELED: Prolactin    2. HIV disease (HCC)  B20 RPR    3. Penile discharge  R36.9 RPR         Past Psychiatric History: Mild intellectual  disability,schizoaffective disorder bipolar type, suicidal ideations, chronic homicidal ideations and Polysubstance abuse (cannabis use, cocaine use)   Past Medical History:  Past Medical History:  Diagnosis Date   Aggression    Alcohol abuse    Asthma    Gonorrhea 02/09/2020   Grief 06/14/2020   HIV disease (HCC) 03/11/2019   Homeless    Hyperlipidemia 05/13/2023   Hypertension    Penile discharge 05/19/2019   Schizophrenia (HCC)    Vaccine counseling 10/24/2022    Past Surgical History:  Procedure Laterality Date   KNEE SURGERY     MOUTH SURGERY      Family Psychiatric History: Reports that his maternal family has issues with substance use. Mental health conditions   Family History: No family history on file.  Social History:  Social History   Socioeconomic History   Marital status: Married    Spouse name: Not on file   Number of children: Not on file   Years of education: Not on file   Highest education level: Not on file  Occupational History   Not on file  Tobacco Use   Smoking status: Every Day    Types: Cigarettes   Smokeless tobacco: Never  Substance and Sexual Activity   Alcohol use: Not Currently   Drug use: Not Currently    Types: Cocaine, Marijuana, Hydrocodone, MDMA (Ecstacy)    Comment: former; patient states he has been clean since 01/25/20   Sexual activity: Yes    Partners: Female    Birth control/protection: Condom    Comment: "just give me 2 condoms"  Other Topics Concern   Not on file  Social History Narrative   Not on file   Social Drivers of Health   Financial Resource Strain: Not on file  Food Insecurity: Not on file  Transportation Needs: Not on file  Physical Activity: Not on file  Stress: Not on file  Social Connections: Not on file    Allergies:  Allergies  Allergen Reactions   Prunus Persica Swelling and Other (See Comments)    "Peaches"   Sudafed [Pseudoephedrine] Anaphylaxis, Swelling and Rash   Other Itching and  Other (See Comments)    Pollen- "closes my throat" and causes itchy eyes & a runny nose    Metabolic Disorder Labs:  Lab Results  Component Value Date   PROLACTIN CANCELED 09/26/2022   Lab Results  Component Value Date   CHOL 187 05/14/2023   TRIG 190 (H) 05/14/2023   HDL 42 05/14/2023   CHOLHDL 4.5 05/14/2023   VLDL 37 03/14/2009   LDLCALC 114 (H) 05/14/2023   LDLCALC 91 01/04/2023   Lab Results  Component Value Date   TSH CANCELED 09/26/2022   TSH 1.62 10/31/2021    Therapeutic Level Labs: Lab Results  Component Value Date   LITHIUM 0.09 (L) 10/25/2022   LITHIUM 0.09 (L) 10/10/2022   Lab Results  Component Value Date   VALPROATE <10 (L) 02/23/2015   VALPROATE <10.0 (L) 12/10/2010   No results found for: "CBMZ"  Current Medications: Current Outpatient Medications  Medication Sig Dispense Refill   haloperidol decanoate (HALDOL DECANOATE) 100 MG/ML injection Inject 1 mL (100 mg total) into the muscle every 28 (twenty-eight) days. 1 mL 11   amLODipine (NORVASC) 5 MG tablet Take 5 mg by mouth daily.     benztropine (COGENTIN) 1 MG tablet Take 1 tablet (1 mg total) by mouth 2 (two) times daily. 60 tablet 3   bictegravir-emtricitabine-tenofovir AF (BIKTARVY) 50-200-25 MG TABS tablet Take 1 tablet by mouth daily. 30 tablet 11   divalproex (DEPAKOTE) 500 MG DR tablet Take 1 tablet (500 mg total) by mouth at bedtime. 30 tablet 3   levETIRAcetam (KEPPRA) 500 MG tablet Take 500 mg by mouth 2 (two) times daily.     lithium 300 MG tablet Take 4 tablets (1,200 mg total) by mouth at bedtime. 120 tablet 3   Current Facility-Administered Medications  Medication Dose Route Frequency Provider Last Rate Last Admin   haloperidol decanoate (HALDOL DECANOATE) 100 MG/ML injection 100 mg  100 mg Intramuscular Q28 days          Musculoskeletal: Strength & Muscle Tone: within normal limits Gait & Station: normal Patient leans: N/A  Psychiatric Specialty Exam: Review of Systems   Blood pressure (!) 137/102, pulse 77, temperature 98 F (36.7 C), height 5\' 10"  (1.778 m), weight 196 lb 6.4 oz (89.1 kg), SpO2 99%.Body mass index is 28.18 kg/m.  General Appearance: Well Groomed  Eye Contact:  Good  Speech:  Clear and Coherent and Normal Rate  Volume:  Normal  Mood:  Euthymic  Affect:  Appropriate and Congruent  Thought Process:  Coherent, Goal Directed, and Linear  Orientation:  Full (Time, Place, and Person)  Thought Content: WDL and Logical   Suicidal Thoughts:  No  Homicidal Thoughts:  No  Memory:  Immediate;   Good Recent;   Good Remote;   Good  Judgement:  Fair  Insight:  Fair and Lacking  Psychomotor Activity:  Normal  Concentration:  Concentration: Good and Attention Span: Good  Recall:  Good  Fund of Knowledge: Good  Language: Good  Akathisia:  No  Handed:  Right  AIMS (if indicated): not done  Assets:  Communication Skills Desire for Improvement Financial Resources/Insurance Housing Intimacy Leisure Time Social Support  ADL's:  Intact  Cognition: WNL  Sleep:  Good   Screenings: AIMS    Flowsheet Row Office Visit from 08/22/2022 in [facility]  AIMS Total Score 1      GAD-7    Flowsheet Row Clinical Support from 05/22/2023 in [facility] Office Visit from 12/07/2022 in [facility] Office Visit from 11/09/2022 in [facility] Office Visit from 08/22/2022 in [facility]  Total GAD-7 Score 6 20 0 11      PHQ2-9    Flowsheet Row Clinical Support from 05/22/2023 in [facility] Office Visit from 05/14/2023 in [facility] Office Visit from 12/07/2022 in [facility] Office Visit from 11/09/2022 in [facility] Office Visit from 08/22/2022 in [facility]  PHQ-2 Total Score 1 0  1 0 2  PHQ-9 Total Score 3 6 11  0 9      Flowsheet Row ED from 11/06/2022 in [facility] at [facility] ED from 10/29/2022 in [facility] Emergency Department  at [facility] ED from 10/25/2022 in [facility] at [facility]  C-SSRS RISK CATEGORY No Risk No Risk Moderate Risk        Assessment and Plan: Patient informed writer that his mood, anxiety, and depression has improved. He notes that that he is not experiencing any psychosis or paranoia. He has not been taking Oral invega or his Invega LAI.  Patient continues to use marijuana and cocaine. Patient does have elevated blood pressure today 137/102 and notes that he has been urinated in his sleep. He relates it to his elevated blood pressure. Provider asked patient if he has been taking his blood pressure medication. He notes that he was. Provider asked patient if he has a PCP. He notes that he goes to [facility]. Provider instructed patient to follow up with his PCP. He endorsed understanding and agreed.  Provider recommend that Invega be discontinued as patient has been off of it for months. He was agreeable. Patient agreeable to receiving Haldol Decanoate 100 mg every 28 days to manage his mental health. He received his first injection today. Oral haldol discontinued. He will continue all other medications as prescribed. Provider ordered CBC, CMP, LFT, Thyroid Panel, HGB A1c, lithium level, and prolactin level.  1. Schizoaffective disorder, bipolar type (HCC) (Primary)  Continue- benztropine (COGENTIN) 1 MG tablet; Take 1 tablet (1 mg total) by mouth 2 (two) times daily.  Dispense: 60 tablet; Refill: 3 Continue- lithium 300 MG tablet; Take 4 tablets (1,200 mg total) by mouth at bedtime.  Dispense: 120 tablet; Refill: 3 Continue- divalproex (DEPAKOTE) 500 MG DR tablet; Take 1 tablet (500 mg total) by mouth at bedtime.  Dispense: 30 tablet; Refill: 3 - haloperidol decanoate  (HALDOL DECANOATE) 100 MG/ML injection 100 mg - Thyroid Panel With TSH; Future Start- haloperidol decanoate (HALDOL DECANOATE) 100 MG/ML injection; Inject 1 mL (100 mg total) into the muscle every 28 (twenty-eight) days.  Dispense: 1 mL; Refill: 11 - Thyroid Panel With TSH - Lithium level; Future - HgB A1c; Future - Hepatic function panel; Future - Comprehensive Metabolic Panel (CMET); Future - Prolactin; Future - RPR - Prolactin - Comprehensive Metabolic Panel (CMET) - Hepatic function panel - HgB A1c - Lithium level  2. Cocaine use   3. Marijuana dependence (HCC)       Collaboration of Care: Collaboration of Care: Other provider involved in patient's care AEB PCP and shot clinic staff  Patient/Guardian was advised Release of Information must be obtained prior to any record release in order to collaborate their care with an outside provider. Patient/Guardian was advised if they have not already done so to contact the registration department to sign all necessary forms in order for [place] to release information regarding their care.   Consent: Patient/Guardian gives verbal consent for treatment and assignment of benefits for services provided during this visit. Patient/Guardian expressed understanding and agreed to proceed.   Follow up in 2 months Follow up with PCP Shanna Cisco, NP 05/22/2023, 2:45 PM

## 2023-05-28 ENCOUNTER — Other Ambulatory Visit (INDEPENDENT_AMBULATORY_CARE_PROVIDER_SITE_OTHER): Payer: MEDICAID

## 2023-05-28 DIAGNOSIS — B20 Human immunodeficiency virus [HIV] disease: Secondary | ICD-10-CM

## 2023-05-28 DIAGNOSIS — Z79899 Other long term (current) drug therapy: Secondary | ICD-10-CM

## 2023-05-28 DIAGNOSIS — F250 Schizoaffective disorder, bipolar type: Secondary | ICD-10-CM | POA: Diagnosis not present

## 2023-05-28 DIAGNOSIS — F411 Generalized anxiety disorder: Secondary | ICD-10-CM

## 2023-05-28 DIAGNOSIS — R369 Urethral discharge, unspecified: Secondary | ICD-10-CM

## 2023-05-28 NOTE — Addendum Note (Signed)
 Addended by: Keturah Shavers on: 05/28/2023 01:31 PM   Modules accepted: Orders

## 2023-05-28 NOTE — Progress Notes (Signed)
 Patient tolerated labs with no compalints

## 2023-06-19 ENCOUNTER — Ambulatory Visit (HOSPITAL_COMMUNITY): Payer: MEDICAID

## 2023-06-27 ENCOUNTER — Telehealth (HOSPITAL_COMMUNITY): Payer: Self-pay | Admitting: Psychiatry

## 2023-06-27 NOTE — Telephone Encounter (Signed)
 Provider attempted to call patient without success.  Voicemail left encouraging patient to show up to his appointment next week to receive his LAI.  Provider also informed patient that if refills of medications are needed they can be refilled at that time.

## 2023-07-03 ENCOUNTER — Ambulatory Visit (HOSPITAL_COMMUNITY)

## 2023-07-03 NOTE — Progress Notes (Signed)
 The 10-year ASCVD risk score (Arnett DK, et al., 2019) is: 14.1%   Values used to calculate the score:     Age: 48 years     Sex: Male     Is Non-Hispanic African American: Yes     Diabetic: No     Tobacco smoker: Yes     Systolic Blood Pressure: 137 mmHg     Is BP treated: Yes     HDL Cholesterol: 42 mg/dL     Total Cholesterol: 187 mg/dL  Arlon Bergamo, BSN, RN

## 2023-07-04 ENCOUNTER — Ambulatory Visit (HOSPITAL_COMMUNITY)

## 2023-07-11 ENCOUNTER — Ambulatory Visit: Payer: Self-pay | Admitting: Infectious Disease

## 2023-07-12 ENCOUNTER — Telehealth: Payer: Self-pay

## 2023-07-12 DIAGNOSIS — B20 Human immunodeficiency virus [HIV] disease: Secondary | ICD-10-CM

## 2023-07-12 MED ORDER — BIKTARVY 50-200-25 MG PO TABS: 1.0000 | ORAL_TABLET | Freq: Every day | ORAL | 3 refills | Status: DC

## 2023-07-12 NOTE — Telephone Encounter (Signed)
 Received call from [place] with [facility] requesting Rx for Biktarvy .   Judianne Seiple, BSN, RN

## 2023-07-18 ENCOUNTER — Other Ambulatory Visit: Payer: Self-pay

## 2023-07-18 ENCOUNTER — Ambulatory Visit (HOSPITAL_COMMUNITY)
Admission: EM | Admit: 2023-07-18 | Discharge: 2023-07-18 | Discharge status: Against Medical Advice | Attending: Psychiatry | Admitting: Psychiatry

## 2023-07-18 DIAGNOSIS — Z91148 Patient's other noncompliance with medication regimen for other reason: Secondary | ICD-10-CM

## 2023-07-18 DIAGNOSIS — F1994 Other psychoactive substance use, unspecified with psychoactive substance-induced mood disorder: Secondary | ICD-10-CM

## 2023-07-18 DIAGNOSIS — F1910 Other psychoactive substance abuse, uncomplicated: Secondary | ICD-10-CM

## 2023-07-18 DIAGNOSIS — F1414 Cocaine abuse with cocaine-induced mood disorder: Secondary | ICD-10-CM | POA: Diagnosis not present

## 2023-07-18 DIAGNOSIS — F209 Schizophrenia, unspecified: Secondary | ICD-10-CM | POA: Insufficient documentation

## 2023-07-18 DIAGNOSIS — F250 Schizoaffective disorder, bipolar type: Secondary | ICD-10-CM

## 2023-07-18 MED ORDER — OLANZAPINE 10 MG IM SOLR: 5.0000 mg | Freq: Three times a day (TID) | INTRAMUSCULAR | Status: DC | PRN

## 2023-07-18 MED ORDER — ACETAMINOPHEN 325 MG PO TABS: 650.0000 mg | ORAL_TABLET | Freq: Four times a day (QID) | ORAL | Status: DC | PRN

## 2023-07-18 MED ORDER — BENZTROPINE MESYLATE 1 MG PO TABS: 1.0000 mg | ORAL_TABLET | Freq: Two times a day (BID) | ORAL | Status: DC

## 2023-07-18 MED ORDER — ALUM & MAG HYDROXIDE-SIMETH 200-200-20 MG/5ML PO SUSP: 30.0000 mL | ORAL | Status: DC | PRN

## 2023-07-18 MED ORDER — LORAZEPAM 2 MG/ML IJ SOLN: 2.0000 mg | Freq: Three times a day (TID) | INTRAMUSCULAR | Status: DC | PRN

## 2023-07-18 MED ORDER — HALOPERIDOL 5 MG PO TABS: 5.0000 mg | ORAL_TABLET | Freq: Three times a day (TID) | ORAL | Status: DC | PRN

## 2023-07-18 MED ORDER — HALOPERIDOL LACTATE 5 MG/ML IJ SOLN: 10.0000 mg | Freq: Three times a day (TID) | INTRAMUSCULAR | Status: DC | PRN

## 2023-07-18 MED ORDER — DIPHENHYDRAMINE HCL 50 MG PO CAPS: 50.0000 mg | ORAL_CAPSULE | Freq: Three times a day (TID) | ORAL | Status: DC | PRN

## 2023-07-18 MED ORDER — HALOPERIDOL LACTATE 5 MG/ML IJ SOLN
10.0000 mg | Freq: Once | INTRAMUSCULAR | Status: AC
  Administered 2023-07-18: 10 mg via INTRAMUSCULAR
  Filled 2023-07-18: qty 2

## 2023-07-18 MED ORDER — OLANZAPINE 10 MG IM SOLR: 10.0000 mg | Freq: Three times a day (TID) | INTRAMUSCULAR | Status: DC | PRN

## 2023-07-18 MED ORDER — LORAZEPAM 1 MG PO TABS
2.0000 mg | ORAL_TABLET | Freq: Once | ORAL | Status: AC
  Administered 2023-07-18: 2 mg via ORAL
  Filled 2023-07-18: qty 2

## 2023-07-18 MED ORDER — DIPHENHYDRAMINE HCL 50 MG/ML IJ SOLN
50.0000 mg | Freq: Four times a day (QID) | INTRAMUSCULAR | Status: DC | PRN
  Administered 2023-07-18: 50 mg via INTRAMUSCULAR
  Filled 2023-07-18: qty 1

## 2023-07-18 MED ORDER — BICTEGRAVIR-EMTRICITAB-TENOFOV 50-200-25 MG PO TABS: 1.0000 | ORAL_TABLET | Freq: Every day | ORAL | Status: DC

## 2023-07-18 MED ORDER — HALOPERIDOL LACTATE 5 MG/ML IJ SOLN: 5.0000 mg | Freq: Three times a day (TID) | INTRAMUSCULAR | Status: DC | PRN

## 2023-07-18 MED ORDER — DIPHENHYDRAMINE HCL 50 MG/ML IJ SOLN: 50.0000 mg | Freq: Three times a day (TID) | INTRAMUSCULAR | Status: DC | PRN

## 2023-07-18 MED ORDER — MAGNESIUM HYDROXIDE 400 MG/5ML PO SUSP: 30.0000 mL | Freq: Every day | ORAL | Status: DC | PRN

## 2023-07-18 MED ORDER — LORAZEPAM 2 MG/ML PO CONC: 2.0000 mg | Freq: Once | ORAL | Status: AC

## 2023-07-18 MED ORDER — AMLODIPINE BESYLATE 5 MG PO TABS: 5.0000 mg | ORAL_TABLET | Freq: Every day | ORAL | Status: DC

## 2023-07-18 MED ORDER — HALOPERIDOL DECANOATE 100 MG/ML IM SOLN
100.0000 mg | INTRAMUSCULAR | Status: DC
Start: 2023-07-18 — End: 2023-07-18

## 2023-07-18 MED ORDER — OLANZAPINE 5 MG PO TBDP: 5.0000 mg | ORAL_TABLET | Freq: Three times a day (TID) | ORAL | Status: DC | PRN

## 2023-07-18 NOTE — Discharge Instructions (Addendum)
 Refused resources. Refused LAI. Refused all meds.
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
## 2023-07-18 NOTE — ED Notes (Signed)
 Pt observed lying in bed eyes closed. Even and non labored.NAD
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
## 2023-07-18 NOTE — ED Notes (Signed)
 Pt lying in bed.  Attempted to get VS from patient and to administer scheduled medications including haldol  decanoate LAI.   Pt then loudly demanded to know when he was leaving. Several times with angry affect and tone of voice.  Pt asking to see provider, reminded patient that provider was in earlier and he was sleeping soundly.  Pt stated loudly,  "I am voluntary, they told me I could leave last night, I just needed a shot because I had not slept 13 days."   Pt stated " no vital signs, no shot until I can leave. I have trying to be nice and patient, y'all think I am joke!' Pt began to walk around this nurse on unit.   Pt refused all medications and VS.   Pt becoming more agitated and demanding.  "You think I am a joke, you think I am playing with you?" This statement was directed at this RN.  While pointing finger in this Rns face.   Provider Arzella Laurence NP made aware.  Provider and security went to bedside to assess pt. Pt requesting to leave.  Pt left AMA.  Pt refused AVS paper work and education. Pt escorted off property by security

## 2023-07-18 NOTE — ED Notes (Signed)
 Doris NP came to assess patient.. Pt  sleeping at present  d/t emergent medications given at the end of night shift.  Will notify provider when patient wakes up
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
## 2023-07-18 NOTE — Telephone Encounter (Signed)
 Received call from Medical records department of [place] detention [facility]. Appointment was made on patients behalf after release on 07/13/2023. Patient was not made aware of appointment details. Patient currently at [facility] urgent care for observation. Staff sent message to [facility] RN staff to notify patient of upcoming appointment with [facility] at discharge.   Hendricks Locker, LPN
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
## 2023-07-23 ENCOUNTER — Other Ambulatory Visit: Payer: Self-pay

## 2023-07-23 ENCOUNTER — Emergency Department (HOSPITAL_COMMUNITY)
Admission: EM | Admit: 2023-07-23 | Discharge: 2023-07-23 | Disposition: A | Discharge status: Home / Self Care | Attending: Emergency Medicine | Admitting: Emergency Medicine

## 2023-07-23 ENCOUNTER — Encounter (HOSPITAL_COMMUNITY): Payer: Self-pay

## 2023-07-23 ENCOUNTER — Emergency Department (HOSPITAL_COMMUNITY)

## 2023-07-23 DIAGNOSIS — S50311A Abrasion of right elbow, initial encounter: Secondary | ICD-10-CM | POA: Diagnosis not present

## 2023-07-23 DIAGNOSIS — S59901A Unspecified injury of right elbow, initial encounter: Secondary | ICD-10-CM

## 2023-07-23 DIAGNOSIS — S5011XA Contusion of right forearm, initial encounter: Secondary | ICD-10-CM | POA: Insufficient documentation

## 2023-07-23 MED ORDER — IBUPROFEN 800 MG PO TABS
800.0000 mg | ORAL_TABLET | Freq: Once | ORAL | Status: AC
  Administered 2023-07-23: 800 mg via ORAL
  Filled 2023-07-23: qty 1

## 2023-07-23 NOTE — Discharge Instructions (Signed)
 Motrin  as needed as directed. Recheck with your doctor if pain continues.
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
## 2023-07-30 ENCOUNTER — Telehealth (HOSPITAL_COMMUNITY): Payer: MEDICAID | Admitting: Psychiatry

## 2023-07-30 ENCOUNTER — Encounter (HOSPITAL_COMMUNITY): Payer: Self-pay

## 2023-08-10 ENCOUNTER — Encounter (HOSPITAL_COMMUNITY): Payer: Self-pay | Admitting: *Deleted

## 2023-08-10 ENCOUNTER — Emergency Department (HOSPITAL_COMMUNITY)
Admission: EM | Admit: 2023-08-10 | Discharge: 2023-08-10 | Disposition: A | Discharge status: Home / Self Care | Attending: Emergency Medicine | Admitting: Emergency Medicine

## 2023-08-10 ENCOUNTER — Other Ambulatory Visit: Payer: Self-pay

## 2023-08-10 DIAGNOSIS — R451 Restlessness and agitation: Secondary | ICD-10-CM | POA: Diagnosis present

## 2023-08-10 MED ORDER — DIPHENHYDRAMINE HCL 25 MG PO CAPS
50.0000 mg | ORAL_CAPSULE | Freq: Once | ORAL | Status: AC
  Administered 2023-08-10: 50 mg via ORAL
  Filled 2023-08-10: qty 2

## 2023-08-10 MED ORDER — HALOPERIDOL 5 MG PO TABS
10.0000 mg | ORAL_TABLET | Freq: Once | ORAL | Status: AC
  Administered 2023-08-10: 10 mg via ORAL
  Filled 2023-08-10: qty 2

## 2023-08-10 MED ORDER — LORAZEPAM 1 MG PO TABS
2.0000 mg | ORAL_TABLET | Freq: Once | ORAL | Status: AC
  Administered 2023-08-10: 2 mg via ORAL
  Filled 2023-08-10: qty 2

## 2023-08-10 MED ORDER — HALOPERIDOL LACTATE 5 MG/ML IJ SOLN: 5.0000 mg | Freq: Once | INTRAMUSCULAR | Status: DC

## 2023-08-10 MED ORDER — DIAZEPAM 5 MG/ML IJ SOLN: 5.0000 mg | Freq: Once | INTRAMUSCULAR | Status: DC
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
## 2023-08-10 NOTE — Discharge Instructions (Signed)
 Get help right away if you have any new or worsening complaints.
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
## 2023-08-10 NOTE — ED Notes (Signed)
 Received call pt is coming in vol requesting his Invega  injection.

## 2023-08-17 ENCOUNTER — Encounter: Payer: Self-pay | Admitting: Internal Medicine

## 2023-08-17 ENCOUNTER — Ambulatory Visit: Admitting: Internal Medicine

## 2023-08-17 NOTE — Patient Instructions (Incomplete)
 Smoking Cessation: QuitlineNC 1-800-QUIT-NOW [phone]); Espaol: 1-855-Djelo-Ya (1-780-445-4976) http://carroll-castaneda.info/

## 2023-08-18 ENCOUNTER — Emergency Department (HOSPITAL_COMMUNITY)

## 2023-08-18 ENCOUNTER — Encounter (HOSPITAL_COMMUNITY): Payer: Self-pay

## 2023-08-18 ENCOUNTER — Emergency Department (HOSPITAL_COMMUNITY)
Admission: EM | Admit: 2023-08-18 | Discharge: 2023-08-18 | Disposition: A | Discharge status: Home / Self Care | Attending: Emergency Medicine | Admitting: Emergency Medicine

## 2023-08-18 ENCOUNTER — Other Ambulatory Visit: Payer: Self-pay

## 2023-08-18 DIAGNOSIS — S20212A Contusion of left front wall of thorax, initial encounter: Secondary | ICD-10-CM | POA: Diagnosis not present

## 2023-08-18 DIAGNOSIS — S62339A Displaced fracture of neck of unspecified metacarpal bone, initial encounter for closed fracture: Secondary | ICD-10-CM

## 2023-08-18 DIAGNOSIS — M79641 Pain in right hand: Secondary | ICD-10-CM | POA: Diagnosis present

## 2023-08-18 DIAGNOSIS — S62336A Displaced fracture of neck of fifth metacarpal bone, right hand, initial encounter for closed fracture: Secondary | ICD-10-CM | POA: Insufficient documentation

## 2023-08-18 MED ORDER — OXYCODONE HCL 5 MG PO TABS
5.0000 mg | ORAL_TABLET | Freq: Once | ORAL | Status: AC
  Administered 2023-08-18: 5 mg via ORAL
  Filled 2023-08-18: qty 1

## 2023-08-18 MED ORDER — OXYCODONE HCL 5 MG PO TABS: 5.0000 mg | ORAL_TABLET | Freq: Four times a day (QID) | ORAL | 0 refills | Status: AC | PRN

## 2023-08-18 NOTE — ED Notes (Signed)
 Ortho tech contacted to apply splint ulnar gutter
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
## 2023-08-18 NOTE — Discharge Instructions (Addendum)
 You broke the metacarpal bone of the fifth finger in your right hand and possible the fourth finger as well. The x-ray of the ribs does not show any fractures, you most likely have a rib contusion - but we treat these same way.   Alternate between ibuprofen  600 mg and tylenol  500 mg every 4 hours for pain and swelling. Take oxycodone every 6 hours as needed for breakthrough pain.  Follow up with Dr. Delene with orthopedics for reevaluation of your hand.  Get help right away if: You have very bad pain. Your skin or nails on your injured hand turn blue or gray even after you loosen your splint. Your injured hand feels cold or becomes numb even after you loosen your splint.
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
## 2023-08-18 NOTE — Progress Notes (Signed)
 Orthopedic Tech Progress Note Patient Details:  [name] [name] June 12, 1975 [id number]  Ortho Devices Type of Ortho Device: Ulna gutter splint Ortho Device/Splint Location: RUE Ortho Device/Splint Interventions: Ordered, Application, Adjustment   Post Interventions Patient Tolerated: Well Instructions Provided: Care of device  [name] [name] 08/18/2023, 10:47 AM

## 2023-08-19 ENCOUNTER — Emergency Department (HOSPITAL_COMMUNITY)
Admission: EM | Admit: 2023-08-19 | Discharge: 2023-08-19 | Discharge status: Against Medical Advice | Attending: Emergency Medicine | Admitting: Emergency Medicine

## 2023-08-19 DIAGNOSIS — Z5321 Procedure and treatment not carried out due to patient leaving prior to being seen by health care provider: Secondary | ICD-10-CM | POA: Insufficient documentation

## 2023-08-19 DIAGNOSIS — M79641 Pain in right hand: Secondary | ICD-10-CM | POA: Diagnosis present

## 2023-08-19 DIAGNOSIS — R042 Hemoptysis: Secondary | ICD-10-CM | POA: Diagnosis not present

## 2023-08-19 NOTE — ED Triage Notes (Signed)
 Pt. Arrives via ems for right hand pain states that the meds that he was given yesterday are not working. Also states that he is still coughing up blood. Pt. States that he does not want to stay. Calls his ride and walks out during the triage process. States that he will come back tomorrow since he wants to leave today. Pt. Understood the risk of leaving without being seen by a provider. Pt. Walks out of ED alone.

## 2023-08-20 ENCOUNTER — Emergency Department (HOSPITAL_COMMUNITY)
Admission: EM | Admit: 2023-08-20 | Discharge: 2023-08-20 | Disposition: A | Discharge status: Home / Self Care | Attending: Emergency Medicine | Admitting: Emergency Medicine

## 2023-08-20 ENCOUNTER — Emergency Department (HOSPITAL_COMMUNITY)

## 2023-08-20 DIAGNOSIS — Z21 Asymptomatic human immunodeficiency virus [HIV] infection status: Secondary | ICD-10-CM | POA: Diagnosis not present

## 2023-08-20 DIAGNOSIS — I10 Essential (primary) hypertension: Secondary | ICD-10-CM | POA: Insufficient documentation

## 2023-08-20 DIAGNOSIS — Z79899 Other long term (current) drug therapy: Secondary | ICD-10-CM | POA: Insufficient documentation

## 2023-08-20 DIAGNOSIS — Z5900 Homelessness unspecified: Secondary | ICD-10-CM | POA: Insufficient documentation

## 2023-08-20 DIAGNOSIS — J45909 Unspecified asthma, uncomplicated: Secondary | ICD-10-CM | POA: Insufficient documentation

## 2023-08-20 DIAGNOSIS — R0781 Pleurodynia: Secondary | ICD-10-CM | POA: Insufficient documentation

## 2023-08-20 DIAGNOSIS — S6291XA Unspecified fracture of right wrist and hand, initial encounter for closed fracture: Secondary | ICD-10-CM | POA: Diagnosis not present

## 2023-08-20 DIAGNOSIS — M79641 Pain in right hand: Secondary | ICD-10-CM | POA: Diagnosis present

## 2023-08-20 MED ORDER — OXYCODONE-ACETAMINOPHEN 5-325 MG PO TABS
2.0000 | ORAL_TABLET | Freq: Once | ORAL | Status: AC
  Administered 2023-08-20: 2 via ORAL
  Filled 2023-08-20: qty 2

## 2023-08-20 NOTE — ED Triage Notes (Signed)
 Pt reports continuing pain in left lower ribs/side esp with inspiration. Been coughing up blood. Also states his hand still hurts very badly and seems warm. Ran out of pain meds. Requesting male PA
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
## 2023-08-20 NOTE — Discharge Instructions (Addendum)
 Follow-up with a hand surgeon as discussed.  Return to the emergency room if you have any worsening symptoms.

## 2023-08-23 ENCOUNTER — Emergency Department (HOSPITAL_COMMUNITY)
Admission: EM | Admit: 2023-08-23 | Discharge: 2023-08-24 | Disposition: A | Discharge status: Other Acute Inpatient Hospital | Attending: Emergency Medicine | Admitting: Emergency Medicine

## 2023-08-23 ENCOUNTER — Encounter (HOSPITAL_COMMUNITY): Payer: Self-pay | Admitting: Emergency Medicine

## 2023-08-23 ENCOUNTER — Other Ambulatory Visit: Payer: Self-pay

## 2023-08-23 DIAGNOSIS — Z0279 Encounter for issue of other medical certificate: Secondary | ICD-10-CM | POA: Insufficient documentation

## 2023-08-23 DIAGNOSIS — F1910 Other psychoactive substance abuse, uncomplicated: Secondary | ICD-10-CM | POA: Insufficient documentation

## 2023-08-23 DIAGNOSIS — F209 Schizophrenia, unspecified: Secondary | ICD-10-CM | POA: Insufficient documentation

## 2023-08-23 DIAGNOSIS — F250 Schizoaffective disorder, bipolar type: Secondary | ICD-10-CM | POA: Diagnosis present

## 2023-08-23 LAB — CBC WITH DIFFERENTIAL/PLATELET
Abs Immature Granulocytes: 0.02 10*3/uL (ref 0.00–0.07)
Basophils Absolute: 0 10*3/uL (ref 0.0–0.1)
Basophils Relative: 1 %
Eosinophils Absolute: 0.2 10*3/uL (ref 0.0–0.5)
Eosinophils Relative: 3 %
HCT: 47.8 % (ref 39.0–52.0)
Hemoglobin: 15.4 g/dL (ref 13.0–17.0)
Immature Granulocytes: 0 %
Lymphocytes Relative: 18 %
Lymphs Abs: 1.3 10*3/uL (ref 0.7–4.0)
MCH: 31.7 pg (ref 26.0–34.0)
MCHC: 32.2 g/dL (ref 30.0–36.0)
MCV: 98.4 fL (ref 80.0–100.0)
Monocytes Absolute: 0.7 10*3/uL (ref 0.1–1.0)
Monocytes Relative: 10 %
Neutro Abs: 4.7 10*3/uL (ref 1.7–7.7)
Neutrophils Relative %: 68 %
Platelets: 186 10*3/uL (ref 150–400)
RBC: 4.86 MIL/uL (ref 4.22–5.81)
RDW: 11.9 % (ref 11.5–15.5)
WBC: 7 10*3/uL (ref 4.0–10.5)
nRBC: 0 % (ref 0.0–0.2)

## 2023-08-23 LAB — COMPREHENSIVE METABOLIC PANEL WITH GFR
ALT: 17 U/L (ref 0–44)
AST: 19 U/L (ref 15–41)
Albumin: 4.1 g/dL (ref 3.5–5.0)
Alkaline Phosphatase: 69 U/L (ref 38–126)
Anion gap: 8 (ref 5–15)
BUN: 9 mg/dL (ref 6–20)
CO2: 27 mmol/L (ref 22–32)
Calcium: 10.1 mg/dL (ref 8.9–10.3)
Chloride: 105 mmol/L (ref 98–111)
Creatinine, Ser: 1.06 mg/dL (ref 0.61–1.24)
GFR, Estimated: >60 mL/min (ref >60)
Glucose, Bld: 90 mg/dL (ref 70–99)
Potassium: 4.1 mmol/L (ref 3.5–5.1)
Sodium: 140 mmol/L (ref 135–145)
Total Bilirubin: 1.2 mg/dL (ref 0.0–1.2)
Total Protein: 7.3 g/dL (ref 6.5–8.1)

## 2023-08-23 LAB — ETHANOL: Alcohol, Ethyl (B): <15 mg/dL (ref <15)

## 2023-08-23 LAB — SALICYLATE LEVEL: Salicylate Lvl: <7 mg/dL — ABNORMAL LOW (ref 7.0–30.0)

## 2023-08-23 LAB — ACETAMINOPHEN LEVEL: Acetaminophen (Tylenol), Serum: <10 ug/mL — ABNORMAL LOW (ref 10–30)

## 2023-08-23 MED ORDER — HALOPERIDOL 5 MG PO TABS: 5.0000 mg | ORAL_TABLET | Freq: Two times a day (BID) | ORAL | Status: DC

## 2023-08-23 MED ORDER — HALOPERIDOL DECANOATE 100 MG/ML IM SOLN
100.0000 mg | INTRAMUSCULAR | Status: DC
  Administered 2023-08-23: 100 mg via INTRAMUSCULAR
  Filled 2023-08-23: qty 1

## 2023-08-23 MED ORDER — HYDROXYZINE HCL 25 MG PO TABS
25.0000 mg | ORAL_TABLET | Freq: Three times a day (TID) | ORAL | Status: DC | PRN
Start: 2023-08-23 — End: 2023-08-24
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
## 2023-08-23 NOTE — ED Notes (Signed)
 Patient calm and cooperative. No distress at this time
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
## 2023-08-24 ENCOUNTER — Encounter (HOSPITAL_COMMUNITY): Payer: Self-pay

## 2023-08-24 ENCOUNTER — Ambulatory Visit (HOSPITAL_COMMUNITY)
Admission: EM | Admit: 2023-08-24 | Discharge: 2023-08-24 | Disposition: A | Discharge status: Home / Self Care | Source: Intra-hospital | Attending: Psychiatry | Admitting: Psychiatry

## 2023-08-24 DIAGNOSIS — Z79899 Other long term (current) drug therapy: Secondary | ICD-10-CM | POA: Insufficient documentation

## 2023-08-24 DIAGNOSIS — F250 Schizoaffective disorder, bipolar type: Secondary | ICD-10-CM

## 2023-08-24 DIAGNOSIS — Z560 Unemployment, unspecified: Secondary | ICD-10-CM | POA: Insufficient documentation

## 2023-08-24 DIAGNOSIS — F1910 Other psychoactive substance abuse, uncomplicated: Secondary | ICD-10-CM | POA: Diagnosis not present

## 2023-08-24 DIAGNOSIS — Z765 Malingerer [conscious simulation]: Secondary | ICD-10-CM | POA: Insufficient documentation

## 2023-08-24 DIAGNOSIS — F209 Schizophrenia, unspecified: Secondary | ICD-10-CM | POA: Diagnosis present

## 2023-08-24 DIAGNOSIS — B20 Human immunodeficiency virus [HIV] disease: Secondary | ICD-10-CM

## 2023-08-24 DIAGNOSIS — R45851 Suicidal ideations: Secondary | ICD-10-CM | POA: Diagnosis not present

## 2023-08-24 DIAGNOSIS — Z5986 Financial insecurity: Secondary | ICD-10-CM | POA: Insufficient documentation

## 2023-08-24 MED ORDER — ALUM & MAG HYDROXIDE-SIMETH 200-200-20 MG/5ML PO SUSP: 30.0000 mL | ORAL | Status: DC | PRN

## 2023-08-24 MED ORDER — OLANZAPINE 10 MG IM SOLR: 5.0000 mg | Freq: Three times a day (TID) | INTRAMUSCULAR | Status: DC | PRN

## 2023-08-24 MED ORDER — AMLODIPINE BESYLATE 5 MG PO TABS
5.0000 mg | ORAL_TABLET | Freq: Every day | ORAL | 0 refills | Status: AC
Start: 2023-08-24 — End: ?

## 2023-08-24 MED ORDER — ACETAMINOPHEN 325 MG PO TABS: 650.0000 mg | ORAL_TABLET | Freq: Four times a day (QID) | ORAL | Status: DC | PRN

## 2023-08-24 MED ORDER — HALOPERIDOL 5 MG PO TABS
15.0000 mg | ORAL_TABLET | Freq: Two times a day (BID) | ORAL | Status: DC
  Administered 2023-08-24: 15 mg via ORAL
  Filled 2023-08-24: qty 3

## 2023-08-24 MED ORDER — AMLODIPINE BESYLATE 5 MG PO TABS
5.0000 mg | ORAL_TABLET | Freq: Every day | ORAL | Status: DC
  Administered 2023-08-24: 5 mg via ORAL
  Filled 2023-08-24: qty 1

## 2023-08-24 MED ORDER — MAGNESIUM HYDROXIDE 400 MG/5ML PO SUSP: 30.0000 mL | Freq: Every day | ORAL | Status: DC | PRN

## 2023-08-24 MED ORDER — BICTEGRAVIR-EMTRICITAB-TENOFOV 50-200-25 MG PO TABS
1.0000 | ORAL_TABLET | Freq: Once | ORAL | Status: AC
  Administered 2023-08-24: 1 via ORAL
  Filled 2023-08-24: qty 1

## 2023-08-24 MED ORDER — OLANZAPINE 5 MG PO TBDP: 5.0000 mg | ORAL_TABLET | Freq: Three times a day (TID) | ORAL | Status: DC | PRN

## 2023-08-24 MED ORDER — BIKTARVY 50-200-25 MG PO TABS: 1.0000 | ORAL_TABLET | Freq: Every day | ORAL | 0 refills | Status: DC

## 2023-08-24 MED ORDER — HALOPERIDOL DECANOATE 100 MG/ML IM SOLN: 100.0000 mg | INTRAMUSCULAR | 0 refills | Status: AC

## 2023-08-24 MED ORDER — DIVALPROEX SODIUM 500 MG PO DR TAB: 500.0000 mg | DELAYED_RELEASE_TABLET | Freq: Every evening | ORAL | 0 refills | Status: DC

## 2023-08-24 MED ORDER — HALOPERIDOL 5 MG PO TABS: 15.0000 mg | ORAL_TABLET | Freq: Two times a day (BID) | ORAL | 0 refills | Status: DC

## 2023-08-24 MED ORDER — NICOTINE 14 MG/24HR TD PT24: 14.0000 mg | MEDICATED_PATCH | Freq: Every day | TRANSDERMAL | Status: DC

## 2023-08-24 MED ORDER — LITHIUM CARBONATE 300 MG PO TABS: 1200.0000 mg | ORAL_TABLET | Freq: Every day | ORAL | 0 refills | Status: AC

## 2023-08-24 MED ORDER — BENZTROPINE MESYLATE 1 MG PO TABS
1.0000 mg | ORAL_TABLET | Freq: Two times a day (BID) | ORAL | Status: DC
  Administered 2023-08-24: 1 mg via ORAL
  Filled 2023-08-24: qty 1

## 2023-08-24 MED ORDER — OLANZAPINE 10 MG IM SOLR: 10.0000 mg | Freq: Three times a day (TID) | INTRAMUSCULAR | Status: DC | PRN

## 2023-08-24 MED ORDER — BENZTROPINE MESYLATE 1 MG PO TABS: 1.0000 mg | ORAL_TABLET | Freq: Two times a day (BID) | ORAL | 0 refills | Status: DC

## 2023-08-24 NOTE — ED Notes (Signed)
Pt asleep at this hour. No apparent distress. RR even and unlabored. Monitored for safety.  
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
## 2023-08-24 NOTE — Progress Notes (Signed)
   08/24/23 0035  [facility] Triage Screening (Walk-ins at [facility] only)  How Did You Hear About Us ? Legal System  What Is the Reason for Your Visit/Call Today? Pt arrived under IVC following a dispute with [employer]. Per IVC  respondant has been diagnosed with schizophrenia, anti-social personality disorder, and he threatened to commit suicide today stating that if he had a gun he would end it all, he barricaded himself in the home and had a stand off with police, he is not taking his medication and is abusing drugs, cocaine. He has a history of hospitalization  How Long Has This Been Causing You Problems? > than 6 months  Have You Recently Had Any Thoughts About Hurting Yourself? No  Are You Planning to Commit Suicide/Harm Yourself At This time? No  Have you Recently Had Thoughts About Hurting Someone Sherral? No  Are You Planning To Harm Someone At This Time? No  Physical Abuse Denies  Verbal Abuse Denies  Sexual Abuse Denies  Exploitation of patient/patient's resources Denies  Self-Neglect Denies  Are you currently experiencing any auditory, visual or other hallucinations? No  Have You Used Any Alcohol or Drugs in the Past 24 Hours? No  Do you have any current medical co-morbidities that require immediate attention? No  What Do You Feel Would Help You the Most Today? Medication(s);Treatment for Depression or other mood problem;Alcohol or Drug Use Treatment  Determination of Need Urgent (48 hours)  Options For Referral [facility];Other: Comment;Inpatient Hospitalization  Determination of Need filed? Yes

## 2023-08-24 NOTE — Discharge Instructions (Addendum)
 Take all medications as prescribed. Resume care with outpatient psychiatrist by scheduling an appointment.  Followup with [facility].  Followup with primary care physician for management of chronic health conditions. Please reduce use of alcohol and illicit drugs that negatively impact your health. Eat a diet rich in produce, whole (minimally processed) grains, nuts/seeds, beans/legumes, eggs, fermented food, seafood, lowfat dairy (if tolerated), and lean meat. Optimize water  intake. Reduce/Limit intake of (ultra)processed foods and sugar-sweetened beverages.

## 2023-09-18 ENCOUNTER — Encounter (HOSPITAL_COMMUNITY): Payer: Self-pay | Admitting: Physician Assistant

## 2023-09-18 ENCOUNTER — Ambulatory Visit (INDEPENDENT_AMBULATORY_CARE_PROVIDER_SITE_OTHER): Admitting: Physician Assistant

## 2023-09-18 ENCOUNTER — Ambulatory Visit (HOSPITAL_COMMUNITY): Admitting: Psychiatry

## 2023-09-18 ENCOUNTER — Encounter (HOSPITAL_COMMUNITY): Payer: Self-pay

## 2023-09-18 VITALS — BP 125/83 | HR 73 | Temp 98.2°F | Ht 70.0 in | Wt 171.0 lb

## 2023-09-18 DIAGNOSIS — F250 Schizoaffective disorder, bipolar type: Secondary | ICD-10-CM | POA: Diagnosis not present
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
## 2023-09-22 ENCOUNTER — Emergency Department (HOSPITAL_COMMUNITY)
Admission: EM | Admit: 2023-09-22 | Discharge: 2023-09-22 | Disposition: A | Discharge status: Home / Self Care | Attending: Emergency Medicine | Admitting: Emergency Medicine

## 2023-09-22 DIAGNOSIS — Z91148 Patient's other noncompliance with medication regimen for other reason: Secondary | ICD-10-CM | POA: Insufficient documentation

## 2023-09-22 DIAGNOSIS — F209 Schizophrenia, unspecified: Secondary | ICD-10-CM | POA: Diagnosis present

## 2023-09-22 DIAGNOSIS — J45909 Unspecified asthma, uncomplicated: Secondary | ICD-10-CM | POA: Diagnosis not present

## 2023-09-22 DIAGNOSIS — I10 Essential (primary) hypertension: Secondary | ICD-10-CM | POA: Diagnosis not present

## 2023-09-22 DIAGNOSIS — Z79899 Other long term (current) drug therapy: Secondary | ICD-10-CM | POA: Insufficient documentation

## 2023-09-22 LAB — CBC WITH DIFFERENTIAL/PLATELET
Abs Immature Granulocytes: 0.02 K/uL (ref 0.00–0.07)
Basophils Absolute: 0 K/uL (ref 0.0–0.1)
Basophils Relative: 1 %
Eosinophils Absolute: 0.2 K/uL (ref 0.0–0.5)
Eosinophils Relative: 2 %
HCT: 40 % (ref 39.0–52.0)
Hemoglobin: 13.2 g/dL (ref 13.0–17.0)
Immature Granulocytes: 0 %
Lymphocytes Relative: 24 %
Lymphs Abs: 1.9 K/uL (ref 0.7–4.0)
MCH: 31.7 pg (ref 26.0–34.0)
MCHC: 33 g/dL (ref 30.0–36.0)
MCV: 95.9 fL (ref 80.0–100.0)
Monocytes Absolute: 0.8 K/uL (ref 0.1–1.0)
Monocytes Relative: 10 %
Neutro Abs: 5 K/uL (ref 1.7–7.7)
Neutrophils Relative %: 63 %
Platelets: 185 K/uL (ref 150–400)
RBC: 4.17 MIL/uL — ABNORMAL LOW (ref 4.22–5.81)
RDW: 12.1 % (ref 11.5–15.5)
WBC: 7.9 K/uL (ref 4.0–10.5)
nRBC: 0 % (ref 0.0–0.2)

## 2023-09-22 LAB — COMPREHENSIVE METABOLIC PANEL WITH GFR
ALT: 14 U/L (ref 0–44)
AST: 18 U/L (ref 15–41)
Albumin: 3.8 g/dL (ref 3.5–5.0)
Alkaline Phosphatase: 77 U/L (ref 38–126)
Anion gap: 9 (ref 5–15)
BUN: 12 mg/dL (ref 6–20)
CO2: 24 mmol/L (ref 22–32)
Calcium: 9.5 mg/dL (ref 8.9–10.3)
Chloride: 108 mmol/L (ref 98–111)
Creatinine, Ser: 0.85 mg/dL (ref 0.61–1.24)
GFR, Estimated: >60 mL/min (ref >60)
Glucose, Bld: 99 mg/dL (ref 70–99)
Potassium: 3.4 mmol/L — ABNORMAL LOW (ref 3.5–5.1)
Sodium: 141 mmol/L (ref 135–145)
Total Bilirubin: 1.1 mg/dL (ref 0.0–1.2)
Total Protein: 7.4 g/dL (ref 6.5–8.1)

## 2023-09-22 MED ORDER — HALOPERIDOL DECANOATE 100 MG/ML IM SOLN
100.0000 mg | Freq: Once | INTRAMUSCULAR | Status: AC
  Administered 2023-09-22: 100 mg via INTRAMUSCULAR
  Filled 2023-09-22: qty 1

## 2023-09-22 MED ORDER — LORAZEPAM 1 MG PO TABS
2.0000 mg | ORAL_TABLET | Freq: Once | ORAL | Status: AC
  Administered 2023-09-22: 2 mg via ORAL
  Filled 2023-09-22: qty 2

## 2023-09-22 NOTE — ED Triage Notes (Signed)
 Patient states he is hyper and anxious. Been very agitated per friend. Needs something to calm him down. Patient denies pain. States he had a lot of caffeine yesterday. Denies any depression, SI and HI. Patient states he just wants something to calm him down.

## 2023-09-22 NOTE — ED Provider Notes (Signed)
 [place] EMERGENCY DEPARTMENT AT [facility] Provider Note   CSN: [id number] Arrival date & time: 09/22/23  9082     Patient presents with: No chief complaint on file.   [name] [name] is a 48 y.o. male.   HPI Patient presents with his wife.  History of schizophrenia.  States he is more anxious.  Presents to get his medicines.  Reviewed note from behavioral health urgent care.  Denies suicidal or homicidal thoughts.  States he just needs his Haldol  shot P reviewing notes it was post be done in 3 days but states he cannot wait.  States he thinks he needs some Ativan  and some Haldol .  Also has not been compliant with his other medicines including his HIV medicine.   Past Medical History:  Diagnosis Date   Aggression    Alcohol abuse    Asthma    Gonorrhea 02/09/2020   Grief 06/14/2020   HIV disease (HCC) 03/11/2019   Homeless    Hyperlipidemia 05/13/2023   Hypertension    Penile discharge 05/19/2019   Schizophrenia (HCC)    Vaccine counseling 10/24/2022    Prior to Admission medications   Medication Sig Start Date End Date Taking? Authorizing Provider  amLODipine  (NORVASC ) 5 MG tablet Take 1 tablet (5 mg total) by mouth daily. 08/24/23   Cole Kandi BROCKS, MD  benztropine  (COGENTIN ) 1 MG tablet Take 1 tablet (1 mg total) by mouth 2 (two) times daily. 08/24/23   Cole Kandi BROCKS, MD  bictegravir-emtricitabine -tenofovir  AF (BIKTARVY ) 50-200-25 MG TABS tablet Take 1 tablet by mouth daily. 08/24/23   Cole Kandi BROCKS, MD  divalproex  (DEPAKOTE ) 500 MG DR tablet Take 1 tablet (500 mg total) by mouth at bedtime. 08/24/23   Cole Kandi BROCKS, MD  haloperidol  (HALDOL ) 5 MG tablet Take 3 tablets (15 mg total) by mouth 2 (two) times daily. 08/24/23 09/23/23  Cole Kandi BROCKS, MD  haloperidol  decanoate (HALDOL  DECANOATE) 100 MG/ML injection Inject 1 mL (100 mg total) into the muscle every 28 (twenty-eight) days for 1 dose. 08/24/23 08/25/23  Cole Kandi BROCKS, MD  lithium  300 MG  tablet Take 4 tablets (1,200 mg total) by mouth at bedtime. 08/24/23   Cole Kandi BROCKS, MD    Allergies: Prunus persica, Sudafed [pseudoephedrine], and Other    Review of Systems  Updated Vital Signs BP 129/88 (BP Location: Left Arm)   Pulse 84   Temp 98.4 F (36.9 C) (Oral)   Resp 16   Ht 5' 10 (1.778 m)   Wt 77.3 kg   SpO2 98%   BMI 24.45 kg/m   Physical Exam Vitals and nursing note reviewed.  Cardiovascular:     Rate and Rhythm: Regular rhythm.  Pulmonary:     Breath sounds: No wheezing.  Skin:    General: Skin is warm.     Capillary Refill: Capillary refill takes less than 2 seconds.  Neurological:     Mental Status: He is alert. Mental status is at baseline.  Psychiatric:     Comments: Somewhat pressured speech but otherwise appropriate     (all labs ordered are listed, but only abnormal results are displayed) Labs Reviewed  CBC WITH DIFFERENTIAL/PLATELET  COMPREHENSIVE METABOLIC PANEL WITH GFR  T-HELPER CELLS (CD4) COUNT (NOT AT [facility])  HIV-1/HIV-2 QUAL RNA    EKG: None  Radiology: No results found.   Procedures   Medications Ordered in the ED  haloperidol  decanoate (HALDOL  DECANOATE) 100 MG/ML injection 100 mg (100 mg Intramuscular Given 09/22/23 1008)  LORazepam  (ATIVAN ) tablet 2 mg (2 mg Oral Given 09/22/23 1008)                                    Medical Decision Making Amount and/or Complexity of Data Reviewed Labs: ordered.  Risk Prescription drug management.   Patient presents for medications.  Requesting his IM Haldol .  It was given here.  Along with some Ativan .  Reviewed notes.  Has also been noncompliant with his other medicines.  Check basic blood work to be followed as an outpatient.  However does not appear to have criteria for an IVC.  Patient eager to leave at this time.  States he will follow-up with his doctors.  Will discharge home.      Final diagnoses:  Noncompliance with medication regimen    ED Discharge Orders      None          Patsey Lot, MD 09/22/23 1017

## 2023-09-22 NOTE — Discharge Instructions (Signed)
 Follow-up with behavioral health, your primary care doctor and

## 2023-09-23 LAB — HIV-1/HIV-2 QUAL RNA
HIV-1 RNA, Qualitative: NONREACTIVE
HIV-2 RNA, Qualitative: NONREACTIVE

## 2023-09-24 LAB — T-HELPER CELLS (CD4) COUNT (NOT AT ARMC)

## 2023-09-25 ENCOUNTER — Encounter (HOSPITAL_COMMUNITY): Payer: Self-pay

## 2023-09-25 ENCOUNTER — Ambulatory Visit (HOSPITAL_COMMUNITY)

## 2023-10-25 ENCOUNTER — Emergency Department (HOSPITAL_COMMUNITY): Payer: MEDICAID

## 2023-10-25 ENCOUNTER — Emergency Department (HOSPITAL_COMMUNITY)
Admission: EM | Admit: 2023-10-25 | Discharge: 2023-10-25 | Disposition: A | Discharge status: Home / Self Care | Attending: Emergency Medicine | Admitting: Emergency Medicine

## 2023-10-25 ENCOUNTER — Other Ambulatory Visit: Payer: Self-pay

## 2023-10-25 DIAGNOSIS — R6884 Jaw pain: Secondary | ICD-10-CM | POA: Insufficient documentation

## 2023-10-25 DIAGNOSIS — Z21 Asymptomatic human immunodeficiency virus [HIV] infection status: Secondary | ICD-10-CM | POA: Insufficient documentation

## 2023-10-25 DIAGNOSIS — Z79899 Other long term (current) drug therapy: Secondary | ICD-10-CM | POA: Diagnosis not present

## 2023-10-25 DIAGNOSIS — S62339A Displaced fracture of neck of unspecified metacarpal bone, initial encounter for closed fracture: Secondary | ICD-10-CM

## 2023-10-25 DIAGNOSIS — S62316A Displaced fracture of base of fifth metacarpal bone, right hand, initial encounter for closed fracture: Secondary | ICD-10-CM | POA: Insufficient documentation

## 2023-10-25 DIAGNOSIS — S6991XA Unspecified injury of right wrist, hand and finger(s), initial encounter: Secondary | ICD-10-CM | POA: Diagnosis present

## 2023-10-25 MED ORDER — OXYCODONE HCL 5 MG PO TABS
5.0000 mg | ORAL_TABLET | Freq: Once | ORAL | Status: AC
  Administered 2023-10-25: 5 mg via ORAL
  Filled 2023-10-25 (×2): qty 1

## 2023-10-25 MED ORDER — OXYCODONE-ACETAMINOPHEN 5-325 MG PO TABS
1.0000 | ORAL_TABLET | Freq: Three times a day (TID) | ORAL | 0 refills | Status: AC | PRN
Start: 2023-10-25 — End: 2023-10-28
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
## 2023-10-25 NOTE — ED Notes (Addendum)
 Pt refuses temp oral and axillary
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
## 2023-10-25 NOTE — Progress Notes (Signed)
 Orthopedic Tech Progress Note Patient Details:  [name] [name] 01-05-1976 [id number]  Ortho Devices Type of Ortho Device: Ulna gutter splint Ortho Device/Splint Interventions: Ordered, Application, Adjustment   Post Interventions Patient Tolerated: Well Instructions Provided: Care of device  [name] 10/25/2023, 8:55 PM

## 2023-11-06 ENCOUNTER — Encounter (HOSPITAL_COMMUNITY): Payer: Self-pay

## 2023-11-06 ENCOUNTER — Encounter (HOSPITAL_COMMUNITY): Payer: MEDICAID | Admitting: Psychiatry

## 2023-11-09 ENCOUNTER — Telehealth (HOSPITAL_COMMUNITY): Payer: Self-pay

## 2023-11-09 NOTE — Telephone Encounter (Signed)
 Patient no showed appointment for injection 8/5 & MM appointment on 9/16 called patient to get him rescheduled number on file not in service.  Thanks!

## 2023-12-22 ENCOUNTER — Other Ambulatory Visit: Payer: Self-pay

## 2023-12-22 ENCOUNTER — Encounter (HOSPITAL_COMMUNITY): Payer: Self-pay | Admitting: Emergency Medicine

## 2023-12-22 ENCOUNTER — Emergency Department (HOSPITAL_COMMUNITY)
Admission: EM | Admit: 2023-12-22 | Discharge: 2023-12-22 | Disposition: A | Discharge status: Home / Self Care | Payer: MEDICAID

## 2023-12-22 DIAGNOSIS — F32A Depression, unspecified: Secondary | ICD-10-CM | POA: Diagnosis not present

## 2023-12-22 DIAGNOSIS — Z91148 Patient's other noncompliance with medication regimen for other reason: Secondary | ICD-10-CM | POA: Diagnosis not present

## 2023-12-22 DIAGNOSIS — R451 Restlessness and agitation: Secondary | ICD-10-CM | POA: Diagnosis present

## 2023-12-22 MED ORDER — HALOPERIDOL DECANOATE 100 MG/ML IM SOLN
100.0000 mg | Freq: Once | INTRAMUSCULAR | Status: AC
  Administered 2023-12-22: 100 mg via INTRAMUSCULAR
  Filled 2023-12-22: qty 1

## 2023-12-22 MED ORDER — LORAZEPAM 1 MG PO TABS
2.0000 mg | ORAL_TABLET | Freq: Once | ORAL | Status: AC
  Administered 2023-12-22: 2 mg via ORAL
  Filled 2023-12-22: qty 2
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
## 2023-12-22 NOTE — ED Notes (Signed)
 Patient reports he has not taken medication in months. He knows he is off track, and wants to get back on track. He is requesting help.

## 2023-12-22 NOTE — ED Notes (Signed)
 Unable to get vitals prior to DC.

## 2023-12-22 NOTE — Discharge Instructions (Signed)
 Please follow-up to get your chronic medications.
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
## 2024-01-11 ENCOUNTER — Encounter (HOSPITAL_COMMUNITY): Payer: Self-pay

## 2024-01-11 ENCOUNTER — Emergency Department (HOSPITAL_COMMUNITY): Payer: MEDICAID

## 2024-01-11 ENCOUNTER — Emergency Department (HOSPITAL_COMMUNITY)
Admission: EM | Admit: 2024-01-11 | Discharge: 2024-01-11 | Payer: MEDICAID | Attending: Emergency Medicine | Admitting: Emergency Medicine

## 2024-01-11 ENCOUNTER — Other Ambulatory Visit: Payer: Self-pay

## 2024-01-11 DIAGNOSIS — R4689 Other symptoms and signs involving appearance and behavior: Secondary | ICD-10-CM

## 2024-01-11 DIAGNOSIS — I10 Essential (primary) hypertension: Secondary | ICD-10-CM | POA: Insufficient documentation

## 2024-01-11 DIAGNOSIS — S0990XA Unspecified injury of head, initial encounter: Secondary | ICD-10-CM

## 2024-01-11 DIAGNOSIS — Z21 Asymptomatic human immunodeficiency virus [HIV] infection status: Secondary | ICD-10-CM | POA: Insufficient documentation

## 2024-01-11 DIAGNOSIS — F250 Schizoaffective disorder, bipolar type: Secondary | ICD-10-CM

## 2024-01-11 DIAGNOSIS — S0181XA Laceration without foreign body of other part of head, initial encounter: Secondary | ICD-10-CM | POA: Diagnosis not present

## 2024-01-11 DIAGNOSIS — R456 Violent behavior: Secondary | ICD-10-CM | POA: Insufficient documentation

## 2024-01-11 DIAGNOSIS — B20 Human immunodeficiency virus [HIV] disease: Secondary | ICD-10-CM

## 2024-01-11 DIAGNOSIS — Z79899 Other long term (current) drug therapy: Secondary | ICD-10-CM | POA: Insufficient documentation

## 2024-01-11 DIAGNOSIS — J45909 Unspecified asthma, uncomplicated: Secondary | ICD-10-CM | POA: Insufficient documentation

## 2024-01-11 LAB — BASIC METABOLIC PANEL WITH GFR
Anion gap: 8 (ref 5–15)
BUN: 12 mg/dL (ref 6–20)
CO2: 26 mmol/L (ref 22–32)
Calcium: 10.1 mg/dL (ref 8.9–10.3)
Chloride: 108 mmol/L (ref 98–111)
Creatinine, Ser: 1.02 mg/dL (ref 0.61–1.24)
GFR, Estimated: >60 mL/min (ref >60)
Glucose, Bld: 86 mg/dL (ref 70–99)
Potassium: 3.5 mmol/L (ref 3.5–5.1)
Sodium: 143 mmol/L (ref 135–145)

## 2024-01-11 LAB — CBC
HCT: 41.9 % (ref 39.0–52.0)
Hemoglobin: 13.8 g/dL (ref 13.0–17.0)
MCH: 32.1 pg (ref 26.0–34.0)
MCHC: 32.9 g/dL (ref 30.0–36.0)
MCV: 97.4 fL (ref 80.0–100.0)
Platelets: 188 K/uL (ref 150–400)
RBC: 4.3 MIL/uL (ref 4.22–5.81)
RDW: 11.9 % (ref 11.5–15.5)
WBC: 7.5 K/uL (ref 4.0–10.5)
nRBC: 0 % (ref 0.0–0.2)

## 2024-01-11 LAB — ACETAMINOPHEN LEVEL: Acetaminophen (Tylenol), Serum: <10 ug/mL — ABNORMAL LOW (ref 10–30)

## 2024-01-11 LAB — SALICYLATE LEVEL: Salicylate Lvl: <7 mg/dL — ABNORMAL LOW (ref 7.0–30.0)

## 2024-01-11 LAB — ETHANOL: Alcohol, Ethyl (B): <15 mg/dL (ref <15)

## 2024-01-11 MED ORDER — DIVALPROEX SODIUM 500 MG PO DR TAB: 500.0000 mg | DELAYED_RELEASE_TABLET | Freq: Every evening | ORAL | 0 refills | Status: AC

## 2024-01-11 MED ORDER — BIKTARVY 50-200-25 MG PO TABS
1.0000 | ORAL_TABLET | Freq: Every day | ORAL | 0 refills | Status: AC
Start: 2024-01-11 — End: ?

## 2024-01-11 MED ORDER — BENZTROPINE MESYLATE 1 MG PO TABS: 1.0000 mg | ORAL_TABLET | Freq: Two times a day (BID) | ORAL | 0 refills | Status: AC

## 2024-01-11 MED ORDER — HALOPERIDOL LACTATE 5 MG/ML IJ SOLN
5.0000 mg | Freq: Once | INTRAMUSCULAR | Status: AC
  Administered 2024-01-11: 5 mg via INTRAMUSCULAR
  Filled 2024-01-11: qty 1

## 2024-01-11 MED ORDER — TETANUS-DIPHTH-ACELL PERTUSSIS 5-2-15.5 LF-MCG/0.5 IM SUSP
0.5000 mL | Freq: Once | INTRAMUSCULAR | Status: DC
  Filled 2024-01-11: qty 0.5

## 2024-01-11 MED ORDER — HALOPERIDOL 5 MG PO TABS: 15.0000 mg | ORAL_TABLET | Freq: Two times a day (BID) | ORAL | 0 refills | Status: AC

## 2024-01-11 MED ORDER — LORAZEPAM 2 MG/ML IJ SOLN
2.0000 mg | Freq: Once | INTRAMUSCULAR | Status: AC
  Administered 2024-01-11: 2 mg via INTRAMUSCULAR
  Filled 2024-01-11: qty 1

## 2024-01-11 NOTE — Discharge Instructions (Addendum)
 Please follow-up with your infectious disease team regarding HIV status, CD4 count.  I refilled your Biktarvy .  I refilled your psychiatric medications and sent the new pharmacy.  Please follow-up with your psychiatric team.  Please return to ED, urgent care or PCP in 10 days for removal of sutures.  Return to ED with new symptoms.

## 2024-01-11 NOTE — ED Provider Notes (Signed)
 [name] EMERGENCY DEPARTMENT AT [facility] Provider Note   CSN: [id number] Arrival date & time: 01/11/24  0149     Patient presents with: Medical Clearance (BIBA from police car for head strike on plexi glass of cop car.  PT sustained laceration to head and chin.  Pt accompanied by several police officers and hand cuffed.  Verbally aggressive, but no threats at this time.  Medical clearance requested by PD)   [name] [name] is a 48 y.o. male with medical history of HIV, alcohol abuse, aggression, asthma, hypertension, schizophrenia.  Presents to ED in police custody.  Patient reportedly had altercation with [employer] this evening resulting in him being arrested.  The patient was brought in by ambulance due to the fact that he had struck his head multiple times on the plexiglass divider of the police car.  Patient has laceration to head.  He is unsure of tetanus update.  He reports that he has been out of his psych medications since July but this was also what he had told EDP in the beginning of this month.  The patient was provided with further psychiatric medications which were sent to his pharmacy but he apparently never picked them up.  He denies any SI, HI, AVH, examination.  He reports that he is doing a lot of drugs recently because I am dying from Parkinson's.  He states he has been off of his Biktarvy .  Workup initiated.  HPI     Prior to Admission medications   Medication Sig Start Date End Date Taking? Authorizing Provider  amLODipine  (NORVASC ) 5 MG tablet Take 1 tablet (5 mg total) by mouth daily. 08/24/23   Cole Kandi BROCKS, MD  benztropine  (COGENTIN ) 1 MG tablet Take 1 tablet (1 mg total) by mouth 2 (two) times daily. 01/11/24   Ruthell Lonni FALCON, PA-C  bictegravir-emtricitabine -tenofovir  AF (BIKTARVY ) 50-200-25 MG TABS tablet Take 1 tablet by mouth daily. 01/11/24   Ruthell Lonni FALCON, PA-C  divalproex  (DEPAKOTE ) 500 MG DR tablet Take 1 tablet (500 mg total) by  mouth at bedtime. 01/11/24   Ruthell Lonni FALCON, PA-C  haloperidol  (HALDOL ) 5 MG tablet Take 3 tablets (15 mg total) by mouth 2 (two) times daily. 01/11/24 02/10/24  Ruthell Lonni FALCON, PA-C  haloperidol  decanoate (HALDOL  DECANOATE) 100 MG/ML injection Inject 1 mL (100 mg total) into the muscle every 28 (twenty-eight) days for 1 dose. 08/24/23 08/25/23  Cole Kandi BROCKS, MD  lithium  300 MG tablet Take 4 tablets (1,200 mg total) by mouth at bedtime. 08/24/23   Cole Kandi BROCKS, MD    Allergies: Prunus persica, Sudafed [pseudoephedrine], and Other    Review of Systems  Unable to perform ROS: Other (Level 5 caveat)  All other systems reviewed and are negative.   Updated Vital Signs BP 135/85 (BP Location: Right Arm)   Pulse 89   Temp 98 F (36.7 C) (Oral)   Resp 16   Ht 5' 10 (1.778 m)   Wt 77.1 kg   SpO2 95%   BMI 24.39 kg/m   Physical Exam Vitals and nursing note reviewed.  Constitutional:      General: He is not in acute distress.    Appearance: He is well-developed.  HENT:     Head: Normocephalic.     Comments: Ecchymosis, hematoma to central forehead.  Lacerations present. Eyes:     Conjunctiva/sclera: Conjunctivae normal.  Cardiovascular:     Rate and Rhythm: Normal rate and regular rhythm.     Heart sounds:  No murmur heard. Pulmonary:     Effort: Pulmonary effort is normal. No respiratory distress.     Breath sounds: Normal breath sounds.  Abdominal:     Palpations: Abdomen is soft.     Tenderness: There is no abdominal tenderness.  Musculoskeletal:        General: No swelling.     Cervical back: Neck supple.  Skin:    General: Skin is warm and dry.     Capillary Refill: Capillary refill takes less than 2 seconds.  Neurological:     Mental Status: He is alert and oriented to person, place, and time. Mental status is at baseline.  Psychiatric:        Mood and Affect: Mood normal.        Behavior: Behavior is agitated, aggressive and combative.     (all  labs ordered are listed, but only abnormal results are displayed) Labs Reviewed  SALICYLATE LEVEL - Abnormal; Notable for the following components:      Result Value   Salicylate Lvl <7.0 (*)    All other components within normal limits  ACETAMINOPHEN  LEVEL - Abnormal; Notable for the following components:   Acetaminophen  (Tylenol ), Serum <10 (*)    All other components within normal limits  CBC  BASIC METABOLIC PANEL WITH GFR  ETHANOL  URINE DRUG SCREEN    EKG: None  Radiology: CT Head Wo Contrast Result Date: 01/11/2024 EXAM: CT HEAD WITHOUT CONTRAST 01/11/2024 03:34:00 AM TECHNIQUE: CT of the head was performed without the administration of intravenous contrast. Automated exposure control, iterative reconstruction, and/or weight based adjustment of the mA/kV was utilized to reduce the radiation dose to as low as reasonably achievable. COMPARISON: None available. CLINICAL HISTORY: Head trauma, moderate-severe FINDINGS: BRAIN AND VENTRICLES: No acute hemorrhage. No evidence of acute infarct. No hydrocephalus. No extra-axial collection. No mass effect or midline shift. ORBITS: No acute abnormality. SINUSES: No acute abnormality. SOFT TISSUES AND SKULL: Right forehead contusion. No skull fracture. IMPRESSION: No acute intracranial abnormality. Right forehead contusion. Electronically signed by: Gilmore Molt MD 01/11/2024 03:50 AM EST RP Workstation: [id number]    .Laceration Repair  Date/Time: 01/11/2024 5:08 AM  Performed by: Ruthell Lonni FALCON, PA-C Authorized by: Ruthell Lonni FALCON, PA-C   Consent:    Consent obtained:  Verbal   Consent given by:  Patient   Risks, benefits, and alternatives were discussed: yes     Risks discussed:  Infection, need for additional repair and nerve damage   Alternatives discussed:  No treatment Universal protocol:    Patient identity confirmed:  Arm band and verbally with patient Laceration details:    Location: Forehead.   Length (cm):   1 Treatment:    Area cleansed with:  Saline   Amount of cleaning:  Standard   Irrigation solution:  Sterile saline Skin repair:    Repair method:  Sutures   Suture size:  5-0   Suture material:  Prolene   Suture technique:  Simple interrupted   Number of sutures:  2 Approximation:    Approximation:  Close Repair type:    Repair type:  Simple Post-procedure details:    Dressing:  Bulky dressing   Procedure completion:  Tolerated well, no immediate complications .Laceration Repair  Date/Time: 01/11/2024 5:09 AM  Performed by: Ruthell Lonni FALCON, PA-C Authorized by: Ruthell Lonni FALCON, PA-C   Consent:    Consent obtained:  Verbal   Consent given by:  Patient   Risks, benefits, and alternatives were discussed: yes  Risks discussed:  Infection and need for additional repair Universal protocol:    Patient identity confirmed:  Verbally with patient and arm band Laceration details:    Location: Forehead.   Length (cm):  0.5 Treatment:    Area cleansed with:  Saline   Amount of cleaning:  Standard   Irrigation solution:  Sterile saline Skin repair:    Repair method:  Sutures   Suture size:  5-0   Suture material:  Prolene   Suture technique:  Simple interrupted   Number of sutures:  1 Approximation:    Approximation:  Close Repair type:    Repair type:  Simple Post-procedure details:    Dressing:  Bulky dressing   Procedure completion:  Tolerated well, no immediate complications    Medications Ordered in the ED  Tdap (ADACEL ) injection 0.5 mL (0.5 mLs Intramuscular Patient Refused/Not Given 01/11/24 0224)  LORazepam  (ATIVAN ) injection 2 mg (2 mg Intramuscular Given 01/11/24 0224)  haloperidol  lactate (HALDOL ) injection 5 mg (5 mg Intramuscular Given 01/11/24 0319)     Medical Decision Making Amount and/or Complexity of Data Reviewed Labs: ordered. Radiology: ordered. ECG/medicine tests: ordered.  Risk Prescription drug management.   48 year old  brought in by [employer].  On arrival, patient refusing to answer any questions.  After multiple attempts, patient did decide to answer questions.  He denies any SI, HI, AVH.  Not responding to internal stimuli.  He is agitated.  Patient required IM Ativan , IM Haldol .  He was resting comfortably after this was given.  Labs drawn.  CBC grossly unremarkable without leukocytosis or anemia.  Ethanol, salicylate, acetaminophen  undetectable.  Base metabolic panel grossly unremarkable.  Stable creatinine.  CT head unremarkable.  Patient apparently refused EKG as document by RN.  Patient refused tetanus update as documented by RN.  Patient lacerations repaired per procedure note.  At this time patient is stable to discharge.  Will discharge patient back to custody of [employer] officers.  Patient chart was reviewed.  Patient apparently is prescribed Cogentin , Haldol ,depakote , biktarvy .  I have reordered this medication to the patient pharmacy.  Advised the patient to follow-up with infectious disease regarding his HIV status.  He voiced understanding.  Stable to discharge.   Final diagnoses:  Aggressive behavior  Injury of head, initial encounter  Laceration of forehead, initial encounter    ED Discharge Orders          Ordered    benztropine  (COGENTIN ) 1 MG tablet  2 times daily        01/11/24 0513    bictegravir-emtricitabine -tenofovir  AF (BIKTARVY ) 50-200-25 MG TABS tablet  Daily        01/11/24 0513    divalproex  (DEPAKOTE ) 500 MG DR tablet  Nightly        01/11/24 0513    haloperidol  (HALDOL ) 5 MG tablet  2 times daily        01/11/24 0513               Amenda Duclos F, PA-C 01/11/24 0515    Palumbo, April, MD 01/11/24 [phone]

## 2024-01-11 NOTE — ED Notes (Signed)
PT refused EKG

## 2024-01-11 NOTE — ED Triage Notes (Signed)
 BIBA from police car for head strike on plexi glass of cop car.  PT sustained laceration to head and chin.  Pt accompanied by several police officers and hand cuffed.  Verbally aggressive, but no threats at this time.  Medical clearance requested by PD  Pt refused to answer questions during triage process.  Masked placed PTA d/t violent behavior.

## 2024-02-22 ENCOUNTER — Other Ambulatory Visit (HOSPITAL_COMMUNITY): Payer: Self-pay | Admitting: Psychiatry

## 2024-03-12 ENCOUNTER — Emergency Department (HOSPITAL_COMMUNITY): Payer: MEDICAID

## 2024-03-12 ENCOUNTER — Emergency Department (HOSPITAL_COMMUNITY)
Admission: EM | Admit: 2024-03-12 | Discharge: 2024-03-12 | Disposition: A | Discharge status: Home / Self Care | Payer: MEDICAID

## 2024-03-12 ENCOUNTER — Other Ambulatory Visit: Payer: Self-pay

## 2024-03-12 ENCOUNTER — Encounter (HOSPITAL_COMMUNITY): Payer: Self-pay

## 2024-03-12 DIAGNOSIS — S59912A Unspecified injury of left forearm, initial encounter: Secondary | ICD-10-CM | POA: Diagnosis present

## 2024-03-12 DIAGNOSIS — S5012XA Contusion of left forearm, initial encounter: Secondary | ICD-10-CM | POA: Insufficient documentation

## 2024-03-12 DIAGNOSIS — W228XXA Striking against or struck by other objects, initial encounter: Secondary | ICD-10-CM | POA: Diagnosis not present

## 2024-03-12 NOTE — ED Triage Notes (Signed)
 Patient hit his left forearm on a door on Friday. Able to feel all fingers. No numbness.

## 2024-03-12 NOTE — ED Notes (Signed)
 Patient became loud and cursing at staff. Threatening staff. Security and Police called and came to patients room. Provider went in to talk with patient and give him disposition. Provider discharged patient and patient escorted out of ED by security. JRPRN

## 2024-03-12 NOTE — ED Provider Notes (Signed)
 " [place] EMERGENCY DEPARTMENT AT [facility] Provider Note   CSN: [id number] Arrival date & time: 03/12/24  1424     Patient presents with: Arm Pain   Amias Hutchinson is a 49 y.o. male.   Pt reports he hit his arm 2 days ago.  Pt reports he hit his arm on a doorway.  Pt complains of pain.    The history is provided by the patient. No language interpreter was used.  Arm Pain This is a new problem. The problem has not changed since onset.Nothing aggravates the symptoms. Nothing relieves the symptoms.       Prior to Admission medications  Medication Sig Start Date End Date Taking? Authorizing Provider  amLODipine  (NORVASC ) 5 MG tablet Take 1 tablet (5 mg total) by mouth daily. 08/24/23   Cole Kandi BROCKS, MD  benztropine  (COGENTIN ) 1 MG tablet Take 1 tablet (1 mg total) by mouth 2 (two) times daily. 01/11/24   Ruthell Lonni FALCON, PA-C  bictegravir-emtricitabine -tenofovir  AF (BIKTARVY ) 50-200-25 MG TABS tablet Take 1 tablet by mouth daily. 01/11/24   Ruthell Lonni FALCON, PA-C  divalproex  (DEPAKOTE ) 500 MG DR tablet Take 1 tablet (500 mg total) by mouth at bedtime. 01/11/24   Ruthell Lonni FALCON, PA-C  haloperidol  (HALDOL ) 5 MG tablet Take 3 tablets (15 mg total) by mouth 2 (two) times daily. 01/11/24 02/10/24  Ruthell Lonni FALCON, PA-C  haloperidol  decanoate (HALDOL  DECANOATE) 100 MG/ML injection Inject 1 mL (100 mg total) into the muscle every 28 (twenty-eight) days for 1 dose. 08/24/23 08/25/23  Cole Kandi BROCKS, MD  lithium  300 MG tablet Take 4 tablets (1,200 mg total) by mouth at bedtime. 08/24/23   Cole Kandi BROCKS, MD    Allergies: Prunus persica, Sudafed [pseudoephedrine], and Other    Review of Systems  All other systems reviewed and are negative.   Updated Vital Signs BP (!) 143/95   Pulse 73   Temp 99 F (37.2 C) (Oral)   Resp 18   Ht 5' 10 (1.778 m)   Wt 77 kg   SpO2 100%   BMI 24.36 kg/m   Physical Exam Vitals reviewed.  Constitutional:       Appearance: Normal appearance.  Musculoskeletal:        General: Swelling and tenderness present.     Comments: Tender left mid forearm, nv and ns intact   Neurological:     Mental Status: He is alert.     (all labs ordered are listed, but only abnormal results are displayed) Labs Reviewed - No data to display  EKG: None  Radiology: DG Forearm Left Result Date: 03/12/2024 CLINICAL DATA:  Pain and swelling. EXAM: LEFT FOREARM - 2 VIEW COMPARISON:  None Available. FINDINGS: There is no evidence of an acute fracture or dislocation. A small bony spur is seen along the left olecranon process. Soft tissues are unremarkable. IMPRESSION: No acute osseous abnormality. Electronically Signed   By: Suzen Dials M.D.   On: 03/12/2024 14:57     Procedures   Medications Ordered in the ED - No data to display                                  Medical Decision Making Pt verbally threatening to EMT.  Pt demanding to be seen.  Pt states he is going to fuck everyone up.    Amount and/or Complexity of Data Reviewed Radiology: ordered and independent interpretation  performed. Decision-making details documented in ED Course.    Details: Xray  right forearm no fracture  Risk OTC drugs. Risk Details: Pt given discharge instructions on contusions.   Pt threatened me and states. Fuck you, I am going to kill you, I will fuck you up  Security and Police escorted pt ut of the department        Final diagnoses:  Contusion of left forearm, initial encounter    ED Discharge Orders     None      An After Visit Summary was printed and given to the patient.     Flint Sonny POUR, [place] 03/12/24 1621  "
